# Patient Record
Sex: Female | Born: 1960 | Race: White | Hispanic: No | Smoking: Never smoker
Health system: Southern US, Community
[De-identification: ages and names within clinical notes are randomized; demographics above are authoritative.]

## PROBLEM LIST (undated history)

## (undated) DIAGNOSIS — T7840XA Allergy, unspecified, initial encounter: Secondary | ICD-10-CM

## (undated) DIAGNOSIS — K589 Irritable bowel syndrome without diarrhea: Secondary | ICD-10-CM

## (undated) DIAGNOSIS — G8929 Other chronic pain: Secondary | ICD-10-CM

## (undated) DIAGNOSIS — K635 Polyp of colon: Secondary | ICD-10-CM

## (undated) DIAGNOSIS — R51 Headache: Secondary | ICD-10-CM

## (undated) DIAGNOSIS — F329 Major depressive disorder, single episode, unspecified: Secondary | ICD-10-CM

## (undated) DIAGNOSIS — R112 Nausea with vomiting, unspecified: Secondary | ICD-10-CM

## (undated) DIAGNOSIS — Z8701 Personal history of pneumonia (recurrent): Secondary | ICD-10-CM

## (undated) DIAGNOSIS — G9050 Complex regional pain syndrome I, unspecified: Secondary | ICD-10-CM

## (undated) DIAGNOSIS — C539 Malignant neoplasm of cervix uteri, unspecified: Secondary | ICD-10-CM

## (undated) DIAGNOSIS — IMO0002 Reserved for concepts with insufficient information to code with codable children: Secondary | ICD-10-CM

## (undated) DIAGNOSIS — E669 Obesity, unspecified: Secondary | ICD-10-CM

## (undated) DIAGNOSIS — Z8719 Personal history of other diseases of the digestive system: Secondary | ICD-10-CM

## (undated) DIAGNOSIS — E039 Hypothyroidism, unspecified: Secondary | ICD-10-CM

## (undated) DIAGNOSIS — K219 Gastro-esophageal reflux disease without esophagitis: Secondary | ICD-10-CM

## (undated) DIAGNOSIS — A048 Other specified bacterial intestinal infections: Secondary | ICD-10-CM

## (undated) DIAGNOSIS — Z9889 Other specified postprocedural states: Secondary | ICD-10-CM

## (undated) DIAGNOSIS — Z8659 Personal history of other mental and behavioral disorders: Secondary | ICD-10-CM

## (undated) DIAGNOSIS — D131 Benign neoplasm of stomach: Secondary | ICD-10-CM

## (undated) DIAGNOSIS — M1990 Unspecified osteoarthritis, unspecified site: Secondary | ICD-10-CM

## (undated) DIAGNOSIS — J449 Chronic obstructive pulmonary disease, unspecified: Secondary | ICD-10-CM

## (undated) DIAGNOSIS — K2970 Gastritis, unspecified, without bleeding: Secondary | ICD-10-CM

## (undated) DIAGNOSIS — J45909 Unspecified asthma, uncomplicated: Secondary | ICD-10-CM

## (undated) DIAGNOSIS — F32A Depression, unspecified: Secondary | ICD-10-CM

## (undated) DIAGNOSIS — D649 Anemia, unspecified: Secondary | ICD-10-CM

## (undated) DIAGNOSIS — M0520 Rheumatoid vasculitis with rheumatoid arthritis of unspecified site: Secondary | ICD-10-CM

## (undated) DIAGNOSIS — F419 Anxiety disorder, unspecified: Secondary | ICD-10-CM

## (undated) DIAGNOSIS — R519 Headache, unspecified: Secondary | ICD-10-CM

## (undated) DIAGNOSIS — N809 Endometriosis, unspecified: Secondary | ICD-10-CM

## (undated) DIAGNOSIS — G4730 Sleep apnea, unspecified: Secondary | ICD-10-CM

## (undated) DIAGNOSIS — M797 Fibromyalgia: Secondary | ICD-10-CM

## (undated) DIAGNOSIS — I499 Cardiac arrhythmia, unspecified: Secondary | ICD-10-CM

## (undated) HISTORY — DX: Endometriosis, unspecified: N80.9

## (undated) HISTORY — DX: Major depressive disorder, single episode, unspecified: F32.9

## (undated) HISTORY — DX: Anemia, unspecified: D64.9

## (undated) HISTORY — DX: Other chronic pain: G89.29

## (undated) HISTORY — DX: Other specified bacterial intestinal infections: A04.8

## (undated) HISTORY — DX: Depression, unspecified: F32.A

## (undated) HISTORY — DX: Anxiety disorder, unspecified: F41.9

## (undated) HISTORY — DX: Sleep apnea, unspecified: G47.30

## (undated) HISTORY — DX: Unspecified asthma, uncomplicated: J45.909

## (undated) HISTORY — PX: NASAL SINUS SURGERY: SHX719

## (undated) HISTORY — PX: COLONOSCOPY: SHX174

## (undated) HISTORY — DX: Gastro-esophageal reflux disease without esophagitis: K21.9

## (undated) HISTORY — DX: Benign neoplasm of stomach: D13.1

## (undated) HISTORY — PX: CHOLECYSTECTOMY: SHX55

## (undated) HISTORY — DX: Irritable bowel syndrome, unspecified: K58.9

## (undated) HISTORY — DX: Obesity, unspecified: E66.9

## (undated) HISTORY — DX: Polyp of colon: K63.5

## (undated) HISTORY — DX: Headache, unspecified: R51.9

## (undated) HISTORY — DX: Personal history of other diseases of the digestive system: Z87.19

## (undated) HISTORY — DX: Rheumatoid vasculitis with rheumatoid arthritis of unspecified site: M05.20

## (undated) HISTORY — DX: Fibromyalgia: M79.7

## (undated) HISTORY — DX: Complex regional pain syndrome I, unspecified: G90.50

## (undated) HISTORY — DX: Chronic obstructive pulmonary disease, unspecified: J44.9

## (undated) HISTORY — DX: Allergy, unspecified, initial encounter: T78.40XA

## (undated) HISTORY — DX: Headache: R51

## (undated) HISTORY — PX: OTHER SURGICAL HISTORY: SHX169

## (undated) HISTORY — DX: Gastritis, unspecified, without bleeding: K29.70

## (undated) HISTORY — DX: Reserved for concepts with insufficient information to code with codable children: IMO0002

## (undated) HISTORY — DX: Malignant neoplasm of cervix uteri, unspecified: C53.9

## (undated) HISTORY — DX: Personal history of pneumonia (recurrent): Z87.01

## (undated) HISTORY — DX: Hypothyroidism, unspecified: E03.9

## (undated) HISTORY — DX: Personal history of other mental and behavioral disorders: Z86.59

## (undated) HISTORY — PX: TONSILLECTOMY: SUR1361

## (undated) HISTORY — DX: Unspecified osteoarthritis, unspecified site: M19.90

## (undated) HISTORY — PX: CERVIX SURGERY: SHX593

## (undated) HISTORY — PX: THORACOSCOPY W/ THORACIC SYMPATHECTOMY: SUR1348

## (undated) HISTORY — PX: UPPER GASTROINTESTINAL ENDOSCOPY: SHX188

---

## 1983-09-22 DIAGNOSIS — Z8711 Personal history of peptic ulcer disease: Secondary | ICD-10-CM

## 1983-09-22 HISTORY — DX: Personal history of peptic ulcer disease: Z87.11

## 1988-09-21 HISTORY — PX: CERVICAL SPINE SURGERY: SHX589

## 1998-04-17 ENCOUNTER — Ambulatory Visit (HOSPITAL_COMMUNITY): Admission: RE | Admit: 1998-04-17 | Discharge: 1998-04-17 | Payer: Self-pay | Admitting: Gastroenterology

## 1998-06-24 ENCOUNTER — Encounter: Admission: RE | Admit: 1998-06-24 | Discharge: 1998-09-22 | Payer: Self-pay | Admitting: Gastroenterology

## 1998-07-01 ENCOUNTER — Encounter: Admission: RE | Admit: 1998-07-01 | Discharge: 1998-07-01 | Payer: Self-pay | Admitting: Family Medicine

## 1998-09-10 ENCOUNTER — Inpatient Hospital Stay (HOSPITAL_COMMUNITY): Admission: AD | Admit: 1998-09-10 | Discharge: 1998-09-12 | Payer: Self-pay | Admitting: Psychiatry

## 1998-09-16 ENCOUNTER — Encounter (HOSPITAL_COMMUNITY): Admission: RE | Admit: 1998-09-16 | Discharge: 1998-12-15 | Payer: Self-pay | Admitting: Psychiatry

## 1998-10-10 ENCOUNTER — Encounter: Admission: RE | Admit: 1998-10-10 | Discharge: 1999-01-08 | Payer: Self-pay | Admitting: Gastroenterology

## 1999-01-17 ENCOUNTER — Encounter: Admission: RE | Admit: 1999-01-17 | Discharge: 1999-04-17 | Payer: Self-pay | Admitting: Gastroenterology

## 1999-04-02 ENCOUNTER — Encounter: Admission: RE | Admit: 1999-04-02 | Discharge: 1999-04-02 | Payer: Self-pay | Admitting: Family Medicine

## 1999-04-28 ENCOUNTER — Encounter: Admission: RE | Admit: 1999-04-28 | Discharge: 1999-04-28 | Payer: Self-pay | Admitting: Family Medicine

## 1999-05-15 ENCOUNTER — Encounter: Admission: RE | Admit: 1999-05-15 | Discharge: 1999-05-15 | Payer: Self-pay | Admitting: Family Medicine

## 1999-05-29 ENCOUNTER — Encounter: Admission: RE | Admit: 1999-05-29 | Discharge: 1999-05-29 | Payer: Self-pay | Admitting: Family Medicine

## 1999-06-11 ENCOUNTER — Other Ambulatory Visit: Admission: RE | Admit: 1999-06-11 | Discharge: 1999-06-11 | Payer: Self-pay | Admitting: Obstetrics and Gynecology

## 1999-06-12 ENCOUNTER — Encounter: Admission: RE | Admit: 1999-06-12 | Discharge: 1999-06-12 | Payer: Self-pay | Admitting: Family Medicine

## 1999-07-01 ENCOUNTER — Encounter: Admission: RE | Admit: 1999-07-01 | Discharge: 1999-07-01 | Payer: Self-pay | Admitting: Sports Medicine

## 1999-07-14 ENCOUNTER — Encounter: Admission: RE | Admit: 1999-07-14 | Discharge: 1999-07-14 | Payer: Self-pay | Admitting: Family Medicine

## 1999-07-30 ENCOUNTER — Encounter: Admission: RE | Admit: 1999-07-30 | Discharge: 1999-07-30 | Payer: Self-pay | Admitting: Family Medicine

## 1999-08-13 ENCOUNTER — Encounter: Admission: RE | Admit: 1999-08-13 | Discharge: 1999-08-13 | Payer: Self-pay | Admitting: Family Medicine

## 1999-09-03 ENCOUNTER — Encounter: Admission: RE | Admit: 1999-09-03 | Discharge: 1999-09-03 | Payer: Self-pay | Admitting: Family Medicine

## 1999-10-02 ENCOUNTER — Encounter: Admission: RE | Admit: 1999-10-02 | Discharge: 1999-10-02 | Payer: Self-pay | Admitting: Family Medicine

## 1999-10-15 ENCOUNTER — Encounter: Admission: RE | Admit: 1999-10-15 | Discharge: 1999-10-15 | Payer: Self-pay | Admitting: Family Medicine

## 1999-12-22 ENCOUNTER — Encounter: Admission: RE | Admit: 1999-12-22 | Discharge: 1999-12-22 | Payer: Self-pay | Admitting: Family Medicine

## 2000-01-13 ENCOUNTER — Encounter: Admission: RE | Admit: 2000-01-13 | Discharge: 2000-01-13 | Payer: Self-pay | Admitting: Family Medicine

## 2000-01-29 ENCOUNTER — Encounter: Admission: RE | Admit: 2000-01-29 | Discharge: 2000-01-29 | Payer: Self-pay | Admitting: Family Medicine

## 2000-03-03 ENCOUNTER — Encounter: Admission: RE | Admit: 2000-03-03 | Discharge: 2000-03-03 | Payer: Self-pay | Admitting: Family Medicine

## 2000-03-25 ENCOUNTER — Encounter: Admission: RE | Admit: 2000-03-25 | Discharge: 2000-03-25 | Payer: Self-pay | Admitting: Family Medicine

## 2000-06-03 ENCOUNTER — Encounter: Admission: RE | Admit: 2000-06-03 | Discharge: 2000-06-03 | Payer: Self-pay | Admitting: Family Medicine

## 2000-06-23 ENCOUNTER — Other Ambulatory Visit: Admission: RE | Admit: 2000-06-23 | Discharge: 2000-06-23 | Payer: Self-pay | Admitting: Obstetrics and Gynecology

## 2001-06-23 ENCOUNTER — Other Ambulatory Visit: Admission: RE | Admit: 2001-06-23 | Discharge: 2001-06-23 | Payer: Self-pay | Admitting: Obstetrics and Gynecology

## 2002-05-31 ENCOUNTER — Encounter: Admission: RE | Admit: 2002-05-31 | Discharge: 2002-05-31 | Payer: Self-pay | Admitting: Internal Medicine

## 2002-05-31 ENCOUNTER — Encounter: Payer: Self-pay | Admitting: Internal Medicine

## 2002-12-08 ENCOUNTER — Ambulatory Visit (HOSPITAL_COMMUNITY): Admission: RE | Admit: 2002-12-08 | Discharge: 2002-12-08 | Payer: Self-pay | Admitting: Internal Medicine

## 2002-12-08 ENCOUNTER — Encounter: Payer: Self-pay | Admitting: Internal Medicine

## 2003-04-11 ENCOUNTER — Other Ambulatory Visit: Admission: RE | Admit: 2003-04-11 | Discharge: 2003-04-11 | Payer: Self-pay | Admitting: Obstetrics and Gynecology

## 2003-09-22 ENCOUNTER — Emergency Department (HOSPITAL_COMMUNITY): Admission: EM | Admit: 2003-09-22 | Discharge: 2003-09-22 | Payer: Self-pay | Admitting: Emergency Medicine

## 2006-04-20 ENCOUNTER — Other Ambulatory Visit: Admission: RE | Admit: 2006-04-20 | Discharge: 2006-04-20 | Payer: Self-pay | Admitting: Obstetrics and Gynecology

## 2006-04-20 ENCOUNTER — Ambulatory Visit (HOSPITAL_COMMUNITY): Admission: RE | Admit: 2006-04-20 | Discharge: 2006-04-20 | Payer: Self-pay | Admitting: Obstetrics and Gynecology

## 2006-09-30 ENCOUNTER — Ambulatory Visit: Payer: Self-pay | Admitting: Gastroenterology

## 2006-10-05 ENCOUNTER — Ambulatory Visit (HOSPITAL_COMMUNITY): Admission: RE | Admit: 2006-10-05 | Discharge: 2006-10-05 | Payer: Self-pay | Admitting: Gastroenterology

## 2007-02-21 ENCOUNTER — Ambulatory Visit: Payer: Self-pay | Admitting: Gastroenterology

## 2007-02-21 LAB — CONVERTED CEMR LAB
Eosinophils Relative: 1.9 % (ref 0.0–5.0)
Ferritin: 15 ng/mL (ref 10.0–291.0)
HCT: 37.3 % (ref 36.0–46.0)
Iron: 80 ug/dL (ref 42–145)
Neutrophils Relative %: 59.4 % (ref 43.0–77.0)
RBC: 4.63 M/uL (ref 3.87–5.11)
RDW: 18 % — ABNORMAL HIGH (ref 11.5–14.6)
Transferrin: 301.1 mg/dL (ref >=212.0)
WBC: 7.8 10*3/uL (ref 4.5–10.5)

## 2007-03-09 ENCOUNTER — Ambulatory Visit: Payer: Self-pay | Admitting: Internal Medicine

## 2007-03-10 ENCOUNTER — Ambulatory Visit (HOSPITAL_COMMUNITY): Admission: RE | Admit: 2007-03-10 | Discharge: 2007-03-10 | Payer: Self-pay | Admitting: Internal Medicine

## 2007-03-24 ENCOUNTER — Ambulatory Visit: Payer: Self-pay | Admitting: Internal Medicine

## 2007-04-01 ENCOUNTER — Encounter: Payer: Self-pay | Admitting: Internal Medicine

## 2007-04-01 ENCOUNTER — Ambulatory Visit (HOSPITAL_COMMUNITY): Admission: RE | Admit: 2007-04-01 | Discharge: 2007-04-01 | Payer: Self-pay | Admitting: Internal Medicine

## 2007-04-01 ENCOUNTER — Encounter (INDEPENDENT_AMBULATORY_CARE_PROVIDER_SITE_OTHER): Payer: Self-pay | Admitting: Gastroenterology

## 2007-04-04 ENCOUNTER — Ambulatory Visit: Payer: Self-pay | Admitting: Gastroenterology

## 2007-04-04 LAB — CONVERTED CEMR LAB
OCCULT 1: POSITIVE — AB
OCCULT 5: NEGATIVE

## 2007-04-15 ENCOUNTER — Ambulatory Visit: Payer: Self-pay | Admitting: Internal Medicine

## 2007-05-03 ENCOUNTER — Ambulatory Visit (HOSPITAL_COMMUNITY): Admission: RE | Admit: 2007-05-03 | Discharge: 2007-05-03 | Payer: Self-pay | Admitting: Internal Medicine

## 2007-05-06 ENCOUNTER — Encounter: Admission: RE | Admit: 2007-05-06 | Discharge: 2007-05-06 | Payer: Self-pay | Admitting: Internal Medicine

## 2007-09-05 HISTORY — PX: LAPAROSCOPIC NISSEN FUNDOPLICATION: SHX1932

## 2007-09-06 ENCOUNTER — Inpatient Hospital Stay (HOSPITAL_COMMUNITY): Admission: RE | Admit: 2007-09-06 | Discharge: 2007-09-07 | Payer: Self-pay | Admitting: Surgery

## 2008-01-14 DIAGNOSIS — Z8659 Personal history of other mental and behavioral disorders: Secondary | ICD-10-CM | POA: Insufficient documentation

## 2008-01-14 DIAGNOSIS — K219 Gastro-esophageal reflux disease without esophagitis: Secondary | ICD-10-CM | POA: Insufficient documentation

## 2008-01-14 DIAGNOSIS — J45991 Cough variant asthma: Secondary | ICD-10-CM | POA: Insufficient documentation

## 2008-01-14 DIAGNOSIS — E039 Hypothyroidism, unspecified: Secondary | ICD-10-CM

## 2008-01-14 DIAGNOSIS — K589 Irritable bowel syndrome without diarrhea: Secondary | ICD-10-CM

## 2008-01-14 DIAGNOSIS — J45909 Unspecified asthma, uncomplicated: Secondary | ICD-10-CM

## 2008-01-14 DIAGNOSIS — F419 Anxiety disorder, unspecified: Secondary | ICD-10-CM | POA: Insufficient documentation

## 2008-01-14 DIAGNOSIS — E669 Obesity, unspecified: Secondary | ICD-10-CM | POA: Insufficient documentation

## 2008-01-14 DIAGNOSIS — F341 Dysthymic disorder: Secondary | ICD-10-CM

## 2008-01-14 HISTORY — DX: Personal history of other mental and behavioral disorders: Z86.59

## 2008-03-20 ENCOUNTER — Telehealth: Payer: Self-pay | Admitting: Internal Medicine

## 2008-08-13 ENCOUNTER — Encounter: Admission: RE | Admit: 2008-08-13 | Discharge: 2008-08-13 | Payer: Self-pay | Admitting: Obstetrics and Gynecology

## 2008-10-10 ENCOUNTER — Encounter: Payer: Self-pay | Admitting: Internal Medicine

## 2008-12-17 ENCOUNTER — Telehealth: Payer: Self-pay | Admitting: Internal Medicine

## 2008-12-19 ENCOUNTER — Encounter: Payer: Self-pay | Admitting: Internal Medicine

## 2009-02-25 ENCOUNTER — Ambulatory Visit: Payer: Self-pay | Admitting: Internal Medicine

## 2009-02-25 DIAGNOSIS — Z9283 Personal history of failed moderate sedation: Secondary | ICD-10-CM

## 2009-02-25 DIAGNOSIS — Z860100 Personal history of colon polyps, unspecified: Secondary | ICD-10-CM | POA: Insufficient documentation

## 2009-02-26 DIAGNOSIS — G9050 Complex regional pain syndrome I, unspecified: Secondary | ICD-10-CM | POA: Insufficient documentation

## 2009-02-26 HISTORY — DX: Complex regional pain syndrome I, unspecified: G90.50

## 2009-02-27 ENCOUNTER — Telehealth: Payer: Self-pay | Admitting: Internal Medicine

## 2009-03-19 ENCOUNTER — Telehealth: Payer: Self-pay | Admitting: Internal Medicine

## 2009-03-22 ENCOUNTER — Telehealth: Payer: Self-pay | Admitting: Internal Medicine

## 2009-03-26 ENCOUNTER — Telehealth: Payer: Self-pay | Admitting: Internal Medicine

## 2009-03-28 ENCOUNTER — Ambulatory Visit (HOSPITAL_COMMUNITY): Admission: RE | Admit: 2009-03-28 | Discharge: 2009-03-28 | Payer: Self-pay | Admitting: Internal Medicine

## 2009-03-28 ENCOUNTER — Ambulatory Visit: Payer: Self-pay | Admitting: Internal Medicine

## 2009-03-29 ENCOUNTER — Telehealth: Payer: Self-pay | Admitting: Internal Medicine

## 2009-04-09 ENCOUNTER — Telehealth: Payer: Self-pay | Admitting: Internal Medicine

## 2009-04-23 ENCOUNTER — Encounter: Payer: Self-pay | Admitting: Internal Medicine

## 2009-04-24 ENCOUNTER — Encounter: Payer: Self-pay | Admitting: Internal Medicine

## 2009-07-12 ENCOUNTER — Telehealth: Payer: Self-pay | Admitting: Internal Medicine

## 2009-08-21 ENCOUNTER — Encounter: Admission: RE | Admit: 2009-08-21 | Discharge: 2009-08-21 | Payer: Self-pay | Admitting: Obstetrics and Gynecology

## 2010-04-22 ENCOUNTER — Encounter: Admission: RE | Admit: 2010-04-22 | Discharge: 2010-04-22 | Payer: Self-pay | Admitting: Internal Medicine

## 2010-09-02 ENCOUNTER — Encounter
Admission: RE | Admit: 2010-09-02 | Discharge: 2010-09-02 | Payer: Self-pay | Source: Home / Self Care | Attending: Internal Medicine | Admitting: Internal Medicine

## 2010-10-12 ENCOUNTER — Encounter: Payer: Self-pay | Admitting: Internal Medicine

## 2010-12-16 ENCOUNTER — Emergency Department (HOSPITAL_COMMUNITY)
Admission: EM | Admit: 2010-12-16 | Discharge: 2010-12-17 | Disposition: A | Discharge status: Other Acute Inpatient Hospital | Payer: Medicare Other | Attending: Emergency Medicine | Admitting: Emergency Medicine

## 2010-12-16 DIAGNOSIS — F3289 Other specified depressive episodes: Secondary | ICD-10-CM | POA: Insufficient documentation

## 2010-12-16 DIAGNOSIS — F329 Major depressive disorder, single episode, unspecified: Secondary | ICD-10-CM | POA: Insufficient documentation

## 2010-12-16 DIAGNOSIS — E039 Hypothyroidism, unspecified: Secondary | ICD-10-CM | POA: Insufficient documentation

## 2010-12-16 DIAGNOSIS — R110 Nausea: Secondary | ICD-10-CM | POA: Insufficient documentation

## 2010-12-16 DIAGNOSIS — R51 Headache: Secondary | ICD-10-CM | POA: Insufficient documentation

## 2010-12-16 DIAGNOSIS — R45851 Suicidal ideations: Secondary | ICD-10-CM | POA: Insufficient documentation

## 2010-12-16 LAB — RAPID URINE DRUG SCREEN, HOSP PERFORMED
Amphetamines: NOT DETECTED
Barbiturates: NOT DETECTED
Benzodiazepines: POSITIVE — AB
Cocaine: NOT DETECTED

## 2010-12-16 LAB — URINALYSIS, ROUTINE W REFLEX MICROSCOPIC
Bilirubin Urine: NEGATIVE
Ketones, ur: NEGATIVE mg/dL
Protein, ur: NEGATIVE mg/dL
Urobilinogen, UA: 0.2 mg/dL (ref 0.0–1.0)

## 2010-12-16 LAB — CBC
Hemoglobin: 12.2 g/dL (ref 12.0–15.0)
MCH: 26.3 pg (ref 26.0–34.0)
RBC: 4.63 MIL/uL (ref 3.87–5.11)
WBC: 7.9 10*3/uL (ref 4.0–10.5)

## 2010-12-16 LAB — COMPREHENSIVE METABOLIC PANEL
ALT: 10 U/L (ref 0–35)
AST: 24 U/L (ref 0–37)
Albumin: 4.1 g/dL (ref 3.5–5.2)
CO2: 27 mEq/L (ref 19–32)
Chloride: 103 mEq/L (ref 96–112)
GFR calc Af Amer: >60 mL/min (ref >60)
GFR calc non Af Amer: >60 mL/min (ref >60)
Potassium: 4.3 mEq/L (ref 3.5–5.1)
Sodium: 138 mEq/L (ref 135–145)
Total Bilirubin: 0.4 mg/dL (ref 0.3–1.2)

## 2010-12-16 LAB — URINE MICROSCOPIC-ADD ON

## 2010-12-16 LAB — DIFFERENTIAL
Basophils Relative: 0 % (ref 0–1)
Lymphs Abs: 2 10*3/uL (ref 0.7–4.0)
Monocytes Relative: 6 % (ref 3–12)
Neutro Abs: 5.3 10*3/uL (ref 1.7–7.7)
Neutrophils Relative %: 67 % (ref 43–77)

## 2010-12-17 ENCOUNTER — Inpatient Hospital Stay (HOSPITAL_COMMUNITY)
Admission: AD | Admit: 2010-12-17 | Discharge: 2010-12-19 | DRG: 885 | Disposition: A | Discharge status: Home / Self Care | Payer: 59 | Source: Ambulatory Visit | Attending: Psychiatry | Admitting: Psychiatry

## 2010-12-17 DIAGNOSIS — R45851 Suicidal ideations: Secondary | ICD-10-CM

## 2010-12-17 DIAGNOSIS — E282 Polycystic ovarian syndrome: Secondary | ICD-10-CM

## 2010-12-17 DIAGNOSIS — K219 Gastro-esophageal reflux disease without esophagitis: Secondary | ICD-10-CM

## 2010-12-17 DIAGNOSIS — G9050 Complex regional pain syndrome I, unspecified: Secondary | ICD-10-CM

## 2010-12-17 DIAGNOSIS — E039 Hypothyroidism, unspecified: Secondary | ICD-10-CM

## 2010-12-17 DIAGNOSIS — Z818 Family history of other mental and behavioral disorders: Secondary | ICD-10-CM

## 2010-12-17 DIAGNOSIS — F339 Major depressive disorder, recurrent, unspecified: Principal | ICD-10-CM

## 2010-12-17 DIAGNOSIS — G8929 Other chronic pain: Secondary | ICD-10-CM

## 2010-12-18 DIAGNOSIS — F329 Major depressive disorder, single episode, unspecified: Secondary | ICD-10-CM

## 2010-12-18 DIAGNOSIS — F3289 Other specified depressive episodes: Secondary | ICD-10-CM

## 2010-12-23 NOTE — Discharge Summary (Signed)
  NAME:  Jo [name], Jo [name]                ACCOUNT NO.:  [id number]  MEDICAL RECORD NO.:  [id number]           PATIENT TYPE:  I  LOCATION:  0501                          FACILITY:  [facility]  PHYSICIAN:  Marlis Edelson, DO        DATE OF BIRTH:  1961/04/17  DATE OF ADMISSION:  12/17/2010 DATE OF DISCHARGE:  12/19/2010                              DISCHARGE SUMMARY   REASON FOR ADMISSION:  This is a 50 year old female due to depression and suicidal thoughts after the death of her mother.  She was feeling very sad.  She had no thoughts of self-harm.  The patient just wanted to get herself feeling better.  SIGNIFICANT LABS:  Urine drug screen positive for benzodiazepines and opiates.  Her CMP was within normal limits.  CBC within normal limits. Urine pregnancy test is negative.  SIGNIFICANT FINDINGS:  The patient was admitted to the adult milieu and involved in mood disorder group.  We reviewed her medications.  We contacted her sister to assess safety concerns, gather collateral information and provide information.  The patient was fully alert and denied any suicidal or homicidal thoughts or psychotic symptoms.  Her speech was clear and abrupt and felt that she was better than the past days.  On day of discharge the patient was feeling much better.  Her sister had visited.  She realized she had strong family support.  They had no safety concerns.  The patient denied any suicidal or homicidal thoughts or psychotic symptoms.  She showed no signs of mania, hypomania and will be with her family after discharge.  Her affect is in full range.  She was comfortable.  She showed no signs of depressive symptoms or anxiety.  She was conversive and thought processes were linear.  DISCHARGE MEDICATIONS: 1. Xanax 0.5 mg q.h.s. 2. Gabapentin 300 mg one t.i.d. 3. Methadone 10 mg q.i.d. 4. Zoloft 100 mg 1-1/2 tablets daily. 5. Allegra as needed. 6. Hydromorphone 4 mg q.4 hours p.r.n. pain. 7. Hydroxyzine 50  mg 1 t.i.d. p.r.n. anxiety. 8. Levothyroxine 100 mcg daily. 9. Maxair 1-2 puffs q.6 hours p.r.n. 10.Metformin 1000 mg b.i.d. 11.Singulair 10 mg 1 daily.  FOLLOWUP:  [facility] on April 2nd at 1:45.  Phone number [phone].     Landry Corporal, N.P.   ______________________________ Marlis Edelson, DO    JO/MEDQ  D:  12/22/2010  T:  12/22/2010  Job:  [id number]  Electronically Signed by Limmie PatriciaP. on 12/23/2010 02:13:30 PM Electronically Signed by Marlis Edelson MD on 12/23/2010 08:43:06 PM

## 2010-12-28 LAB — BASIC METABOLIC PANEL
BUN: 7 mg/dL (ref 6–23)
Chloride: 108 mEq/L (ref 96–112)
GFR calc non Af Amer: >60 mL/min (ref >60)
Glucose, Bld: 77 mg/dL (ref 70–99)
Potassium: 3.7 mEq/L (ref 3.5–5.1)
Sodium: 139 mEq/L (ref 135–145)

## 2010-12-28 LAB — CBC
HCT: 38.6 % (ref 36.0–46.0)
Hemoglobin: 12.6 g/dL (ref 12.0–15.0)
MCV: 79.8 fL (ref 78.0–100.0)
Platelets: 282 10*3/uL (ref 150–400)
RDW: 17.6 % — ABNORMAL HIGH (ref 11.5–15.5)
WBC: 7.4 10*3/uL (ref 4.0–10.5)

## 2010-12-28 LAB — DIFFERENTIAL
Eosinophils Absolute: 0.2 10*3/uL (ref 0.0–0.7)
Lymphocytes Relative: 24 % (ref 12–46)
Lymphs Abs: 1.8 10*3/uL (ref 0.7–4.0)
Monocytes Relative: 7 % (ref 3–12)
Neutro Abs: 4.9 10*3/uL (ref 1.7–7.7)
Neutrophils Relative %: 67 % (ref 43–77)

## 2010-12-28 LAB — GLUCOSE, CAPILLARY: Glucose-Capillary: 71 mg/dL (ref 70–99)

## 2011-02-03 NOTE — Assessment & Plan Note (Signed)
[facility]                         GASTROENTEROLOGY OFFICE NOTE   NAME:[name] [name], [name] [name]                       MRN:          [id number]  DATE:02/21/2007                            DOB:          18-Oct-1960    [name] [name] comes in, is consulted by Dr. [name] at [facility].  She was referred because of her anemia which revealed a  hemoglobin 11.4 and MCV of 76.2, all else was essentially normal as far  as her blood studies were concerned.  She says she is still having a  great deal of reflux symptoms.  She is watching what she eats, is  following the lifestyle changes as much as possible except she continues  to be overweight.  She is taking her Neurontin, Dilaudid, Prilosec,  Aleve,  Synthroid, Repliva, methadone, Reglan 10 mg p.o., and Maxair,  hydrocodone, and doxycycline.  She has marked gastroesophageal reflux  problems with associated pulmonary pathology which is probably related  to her asthma and COPD.  She has a history of irritable bowel,  anxiety/depression with depression reflex sympathetic dystrophy, history  of gastric ulcer disease by a number of years previous, and she has  moderate obesity and anxiety/depression.  She had tried to get a barium  swallow but was unable to swallow the barium and kept regurgitating, so  this study was not very satisfactory.  She has a significantly abnormal  pH study and I just suggested to her that we refer her to Dr. Wenda Low to determine whether she could undergo a hernia repair which I  think would certainly be in her best interest.  In the meantime I wanted  to get some routine labs on her, especially for her anemia, give her  some Carafate slurry or suspension, Nexium b.i.d., get Hemoccults on  her, get anemia profile with serum iron, iron binding, B-12 folate just  to make sure what we are dealing with here.  Her Hemoccults were  negative from her primary care doctor's office.  I also  told her, as I  have in the past, that she needs a colonoscopic examination because of  her mother's history of a large polyp in the cecum which was removed  with a cecotomy that had high grade dysplasia.  She was advised by Dr.  Sabino Gasser to have this done.  I am worried because of her pulmonary  symptoms and the terrible reflux that she has that she is going to get  into other complications if something more defense is not done.  I at  one time thought it would be nice to be able to wait until an intra-  endoscopic fundoplication could be done, unfortunately this is not  available as yet that I am aware of and therefore feel that she should  be at this time referred for consultation to Dr. Wenda Low.     Ulyess Mort, MD  Electronically Signed    SML/MedQ  DD: 02/21/2007  DT: 02/22/2007  Job #: [id number]
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
## 2011-02-03 NOTE — Assessment & Plan Note (Signed)
[facility]                         GASTROENTEROLOGY OFFICE NOTE   NAME:WHICKER, PAILYN BELLEVUE                       MRN:          [id number]  DATE:03/09/2007                            DOB:          18-Apr-1961    ADDENDUM:  [name] was given samples of Zegerid to see if that helped her  reflux any better than the Nexium.     Iva Boop, MD,FACG  Electronically Signed    CEG/MedQ  DD: 03/10/2007  DT: 03/11/2007  Job #: [id number]   cc:   Georgianne Fick, M.D.

## 2011-02-03 NOTE — Op Note (Signed)
NAME:  [name] [name], [name] [name]                ACCOUNT NO.:  [id number]   MEDICAL RECORD NO.:  [id number]          PATIENT TYPE:  AMB   LOCATION:  ENDO                         FACILITY:  [facility]   PHYSICIAN:  Iva Boop, MD,FACGDATE OF BIRTH:  May 27, 1961   DATE OF PROCEDURE:  04/14/2007  DATE OF DISCHARGE:  04/01/2007                               OPERATIVE REPORT   PROCEDURE:  Bravo 48-hour pH monitoring of the esophagus.   INDICATIONS:  Chronic reflux symptoms not responsive to medical therapy.   The patient is on Zegerid 40 mg twice daily for the procedure.   FINDINGS:  1. Day one, she did have reflux, 41 refluxes total.  10.8% pH less      than 4, that was total time.  6.9% was upright, 17.4% supine, 28%      postprandial, 12.5% associated with heartburn.   Day two there were 22 refluxes.  The fraction of time pH less than 4 was  27% a total, 35% upright, 20% supine, 35% postprandial and 75% of  heartburn.  For total she had 63 refluxes, 18% of total time with  fraction of time pH less than 4, upright 17%, supine 19%, postprandial  32% heartburn 13%.   Demeester score for day two was 81.5.   Note the patient had a lot of heartburn during periods of pH less than  4.  On day one there was constant heartburn reported so there was not  really great correlation between heartburn and other symptoms and her  symptoms of reflux.  Notably on day two there was a long period of  acidity and then a rise in the pH and then a drop again and then a rise  again and the pH gap got up to 8 at times.  This makes me question as to  whether or not the probe fell off in day two.   ASSESSMENT:  There is evidence of acid reflux on the study despite  taking Zegerid 40 mg twice daily.  I question whether or not the probe  fell off at some point.  There is poor symptom correlation.  This  continues to raise concerns about the appropriateness for fundoplication  in this patient.   PLAN:  I have discussed  the results with the patient as well as Dr. Wenda Low.  We will hold off on any type of fundoplication procedure that  has been planned.  We will get evaluation with either Dr. Adriana Simas or Dr.  Jacqulyn Bath at [facility] for their opinions  about her reflux, her reflex sympathetic dystrophy, her vomiting and  appropriate therapy.      Iva Boop, MD,FACG  Electronically Signed     CEG/MEDQ  D:  04/14/2007  T:  04/15/2007  Job:  [id number]   cc:   Thornton Park Daphine Deutscher, MD  [address], [address]  [place]  [place] [address]   Georgianne Fick, M.D.  Fax: [phone]

## 2011-02-03 NOTE — Letter (Signed)
August 24, 2007     RE:  VERLEAN, ALLPORT  MRN:  [id number]  /  DOB:  1961-09-18   To Whom It May Concern:   Ms. [name] [name] is a patient of mine.  She has gastroesophageal reflux  disease and is using Zegerid twice daily for severe gastroesophageal  reflux disease.  She has been on all other proton pump inhibitors and  this is the only one that seems to help control her symptoms to a  reasonable degree.   It is my expert opinion as a board-certified gastroenterologist that  this patient needs Zegerid.  I would ask that you consider this and try  to help make this a cost-effective medication for her care.    Sincerely,      Iva Boop, MD,FACG  Electronically Signed    CEG/MedQ  DD: 08/24/2007  DT: 08/24/2007  Job #: [id number]   CC:    Ellery Plunk

## 2011-02-03 NOTE — Assessment & Plan Note (Signed)
[facility]                         GASTROENTEROLOGY OFFICE NOTE   NAME:[name] [name], [name] [name]                       MRN:          [id number]  DATE:04/14/2007                            DOB:          [birth year]    I called [name] and reviewed the results of her Bravo pH study. There is  acid reflux but there are incomplete associations with symptoms here, ie  poor symptom correlation. See that report for full details.   She had benign fundic gland polyps in the stomach. I told her we would  sort out a monitoring process for those, that those are likely related  to proton pump inhibitor use and are benign. She did have an adenomatous  colon polyp. She was difficult to sedate for her procedure and the colon  prep was fair at best. It was really poor overall. We plan to have a  colonoscopy within a year. We will do that under propofol and with a  different prep, probably MoviPrep, hopefully she will be able to hold  that down.     Iva Boop, MD,FACG  Electronically Signed    CEG/MedQ  DD: 04/14/2007  DT: 04/15/2007  Job #: [id number]

## 2011-02-03 NOTE — Assessment & Plan Note (Signed)
HEALTHCARE                         GASTROENTEROLOGY OFFICE NOTE   NAME:[name] [name] GENGLER                       MRN:          [id number]  DATE:03/09/2007                            DOB:          1960-10-29    PROBLEMS:  1. Reflex sympathetic dystrophy.  2. Gastroesophageal reflux disease, not responsive to medications.  3. Asthma.  4. Anxiety and depression.  5. Irritable bowel syndrome.  6. Obesity.  7. History of gastric ulcer.  8. Abnormal 24-hour pH probe, February 2002.  Note:  LES was not      manometrically confirmed.  9. Last upper endoscopy I see January 1999 with small hiatal hernia,      mild antritis of the stomach.  10.Did have gastric ulcer June 1985.  11.History of anorexia nervosa and bulimia, resolved.  12.Hypothyroidism.  13.Drug allergies to erythromycin, tetracycline, Darvocet and codeine.  14.She is sensitive to Reglan with side effects of jitteriness.   MEDICATIONS:  1. Neurontin 300 mg b.i.d.  2. Dilaudid 2 mg t.i.d. p.r.n.  3. Progesterone birth control and estrogen birth control.  4. Alprazolam 0.25 mg b.i.d. p.r.n.  5. Tizanidine hydrochlorothiazide 4 mg three in the morning, a half      pill in the evening.  6. Methadone 10 mg two tabs four times a day p.r.n.  7. Reglan 10 mg p.r.n., used rarely.  8. Synthroid 150 micrograms daily.  9. Repliva 21/7 iron one a day.  10.Nexium 40 mg b.i.d.  11.Omeprazole also taken 40 mg b.i.d.  12.Phenergan 25 mg p.r.n.  13.Hydroxyzine p.r.n.  14.Maxair p.r.n.   [name] [name] has been a patient of Dr. Corinda Gubler and recently saw him and, because  of some rectal bleeding and mother's history of large colon polyp and  high-grade dysplasia, is recommended to have a colonoscopy.  She has not  been able to set that up with Dr. Corinda Gubler, so I will take care of this  for her.  She has a lot of heartburn symptomatology and has a small  hiatal hernia and recently saw Dr. Wenda Low about a fundal  plication.  He is tentatively planning to do that in August.  She has not had a  manometry study that I am aware of.  She has had a lot of asthma,  related to her reflux.  However, she still has symptoms, despite taking  Nexium twice a day and omeprazole twice a day.  I am not sure of her  previous history of PPI therapy at this time.  She is not really  describing dysphagia, but she was unable to tolerate drinking barium  with a barium swallow with regurgitation and it sounds like she has a  lot of regurgitation of fluid, in addition to pyrosis type symptoms.  She also tells me that, when she defecates, she cannot complete a  movement and sometimes has to manually remove the stool.   Additional history is that she has had a thoracic sympathectomy for her  reflux sympathetic dystrophy.  Review of that report does show that the  examination was mildly limited because she regurgitated on a barium  swallow.  She could not complete repetitive swallow.  She had emesis and  regurgitation.  A 12 mm barium tablet passed without difficulty.  A  small hiatal hernia was seen.  It looks like she had a normal primary  peristaltic wave.   PHYSICAL EXAM:  Shows her to be in no acute distress.  Weight 180  pounds.  Pulse 64, blood pressure 106/78, height 5 feet 4 inches.   ASSESSMENT:  1. It certainly sounds like she has gastroesophageal reflux disease,      but I am concerned about the lack of response to medication.  She      is on both omeprazole and Nexium and that does not make any real      sense to me.  Those are similar medications and she is not getting      benefit from both, so she should take only one and I have given her      Nexium samples.  Perhaps she has more of a regurgitation problem      than acid causing problems, as she should be blocking most of her      acid.  The previous pH study is interesting, though at that time,      that study was not done with a manometric confirmation  of her lower      esophageal sphincter, which makes the result suspect, in my      opinion.  I have significant concerns at this time about a      fundoplication and how well she would do with that.  It may very      well be the right thing, but I think further evaluation is      necessary.  2. Family history of colon polyps, rectal bleeding.   PLAN:  1. Gastric emptying study is ordered.  At the time of dictation, this      has returned and the results are normal, fortunately.  She had      normal emptying with 32.4% retention at 60 minutes and 27%      retention at 120 minutes; normal is less than 30%. So, her emptying      was just close to abnormal at 2 hrs, possibly due to medications..  2. I think she should have an esophageal manometry before her fundal      plication and I suspect Dr. Daphine Deutscher will want that.  3. I would consider a pH probe in her, such as a BRAVO probe, on      medication, to see if we can clear up whether or not she is still      making acid on her medication.  4. I ordered a gastrin level on her, as Zollinger-Ellison syndrome      could be possible and then make her refractory to PPIs.  I am      always concerned about patients that are refractory to PPIs and how      it will do with a fundoplication, though sometimes we have to press      forward because of their recalcitrant symptoms, as long as they      understand the possibility of a lack of response.  I do think she      has a functional bowel disorder and that may be playing some role      and I do not know what role the reflex sympathetic dystrophy is      playing, nor the thoracic  sympathectomy, in all this.   I will discuss further with her when she has her upper endoscopy and  colonoscopy.  I think it would be very reasonable to place a BRAVO pH  probe in this lady, as well, and I will discuss  that with her.  I doubt she could handle a 24-hour pH probe again, though she did it in the past.  I  would like to do this on medication.     Iva Boop, MD,FACG  Electronically Signed    CEG/MedQ  DD: 03/10/2007  DT: 03/11/2007  Job #: [id number]   cc:   Thornton Park Daphine Deutscher, MD  Georgianne Fick, M.D.

## 2011-02-06 NOTE — Assessment & Plan Note (Signed)
[facility]                         GASTROENTEROLOGY OFFICE NOTE   NAME:Jo [name], Jo [name]                         MRN:          [id number]  DATE:08/09/2007                            DOB:          1961-04-15    I called [name].  She faxed me the esophageal pH and impedance study and  manometry that she had performed and interpreted by Dr. Jacqulyn Bath at [facility].  She has nonacid reflux.  Her symptom correlation was good per Dr. Edwin Dada  report.  She had a normal manometry of the esophagus.  It makes sense to  proceed with a fundoplication procedure to try to control her reflux.  She is not having significant acid production on her PPI.  I explained  to her that we could not guarantee this would fix all of her problems  with this, but it made medical sense to proceed.     Iva Boop, MD,FACG  Electronically Signed    CEG/MedQ  DD: 08/09/2007  DT: 08/10/2007  Job #: [id number]   cc:   Thornton Park Daphine Deutscher, MD

## 2011-02-06 NOTE — Assessment & Plan Note (Signed)
[facility]                         GASTROENTEROLOGY OFFICE NOTE   NAME:Jo [name], Jo [name]                       MRN:          [id number]  DATE:09/30/2006                            DOB:          [birth year]    She works for [employer].  This young lady says she is having marked  breakthrough in her reflux.  She does have notable COPD and asthma.  There has been no association between this and we have worked her up in  the past. She says when she lies down at night, she has fluid running up  in and out of her nose which is very concerning to her.  She has  elevated her bed to some degree but not greatly and obviously not  enough.  She has not lost enough weight and she is aware of this.  We  have done motility studies and pH studies on her before which reflected  a significant amount of reflux in the past.  In fact, she has never  considered undergoing a surgical procedure.  The last endoscopic  procedure was 1999 which revealed a 3 cm hiatal hernia and mild evidence  of some adjacent gastritis.  In 1980, when she was just a teenager, she  had an extremely large ulcer in her stomach which I thought might be  related to medication at the time.   MEDICATIONS:  Her medications presently include she has a reflex  sympathetic dystrophy on Aleve, Prilosec, Neurontin, methadone, Nexium,  and alprazolam, hydrocodone, Reglan 10 mg p.r.n., and Dilaudid.   PHYSICAL EXAMINATION:  VITAL SIGNS:  She weighs 181 pounds, blood  pressure 100/75, pulse 72 and regular.  The rest of the exam was  unremarkable.   IMPRESSION:  1. Marked gastroesophageal reflux disease symptoms and associated      pulmonary pathology such as chronic obstructive pulmonary disease      and asthma.  2. History of irritable bowel syndrome.  3. More moderate obesity than before.  4. Anxiety and depression.  5. Reflex sympathetic dystrophy.  6. History of gastric ulcer.   RECOMMENDATIONS:  My  recommendation is that we get a barium swallow on  her and encourage her to elevate the head of her bed six to eight  inches, use Nexium b.i.d.  She says she has a hard time getting  medication at this level. Start Reglan 10 mg a.c. and 20 mg at night.  Then consider  getting a fundoplication or consider a [facility] referral for one of the new procedures to correct this hernia  via the endoscope.  I am not sure which procedures they do at the  [facility], but I will check into this depending upon the  results of her further studies.  I certainly think that her symptoms  needs strong consideration.     Ulyess Mort, MD  Electronically Signed    SML/MedQ  DD: 09/30/2006  DT: 10/01/2006  Job #: [id number]

## 2011-06-26 LAB — CBC
HCT: 30.6 — ABNORMAL LOW
HCT: 33 — ABNORMAL LOW
Hemoglobin: 11.3 — ABNORMAL LOW
MCHC: 33.7
MCV: 85.8
Platelets: 261
Platelets: 268
RBC: 3.87
WBC: 11 — ABNORMAL HIGH
WBC: 8.5

## 2011-06-26 LAB — DIFFERENTIAL
Eosinophils Absolute: 0.1 — ABNORMAL LOW
Eosinophils Relative: 0
Eosinophils Relative: 1
Lymphocytes Relative: 8 — ABNORMAL LOW
Lymphs Abs: 0.9
Lymphs Abs: 2.9
Monocytes Relative: 3
Monocytes Relative: 5

## 2011-06-29 LAB — PREGNANCY, URINE: Preg Test, Ur: NEGATIVE

## 2011-08-05 ENCOUNTER — Telehealth: Payer: Self-pay | Admitting: Internal Medicine

## 2011-08-05 NOTE — Telephone Encounter (Signed)
Patient advised.

## 2011-08-05 NOTE — Telephone Encounter (Signed)
Patient reports a bad taste in her mouth after sleeping or lying down.  She does take omeprazole BID for reflux.  Her primary care MD has treated her with diflucan and she sees no improvement in her symptoms.  Dr Leone Payor do you have any suggestions? 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  Dr Leone Payor please advise

## 2011-08-05 NOTE — Telephone Encounter (Signed)
Doubt this is reflux but possible. Try Pepcid complete at bedtime and raise head of bed. If still a problem after 1 month let me know

## 2011-09-22 DIAGNOSIS — A048 Other specified bacterial intestinal infections: Secondary | ICD-10-CM

## 2011-09-22 HISTORY — PX: OTHER SURGICAL HISTORY: SHX169

## 2011-09-22 HISTORY — DX: Other specified bacterial intestinal infections: A04.8

## 2011-10-19 ENCOUNTER — Other Ambulatory Visit: Payer: Self-pay | Admitting: Obstetrics and Gynecology

## 2011-10-19 DIAGNOSIS — Z1231 Encounter for screening mammogram for malignant neoplasm of breast: Secondary | ICD-10-CM

## 2011-11-04 ENCOUNTER — Ambulatory Visit: Payer: Medicare Other

## 2011-12-04 ENCOUNTER — Ambulatory Visit
Admission: RE | Admit: 2011-12-04 | Discharge: 2011-12-04 | Disposition: A | Discharge status: Home / Self Care | Payer: Medicare Other | Source: Ambulatory Visit | Attending: Obstetrics and Gynecology | Admitting: Obstetrics and Gynecology

## 2011-12-04 ENCOUNTER — Ambulatory Visit: Payer: Medicare Other

## 2011-12-04 DIAGNOSIS — Z1231 Encounter for screening mammogram for malignant neoplasm of breast: Secondary | ICD-10-CM

## 2012-01-01 ENCOUNTER — Ambulatory Visit: Payer: 59 | Admitting: Internal Medicine

## 2012-04-21 HISTORY — PX: KNEE ARTHROSCOPY: SHX127

## 2012-07-01 ENCOUNTER — Telehealth: Payer: Self-pay | Admitting: Internal Medicine

## 2012-07-01 NOTE — Telephone Encounter (Signed)
Patient reports that her primary care Dr. Barrie Dunker has prescribed h pylori treatment.  Prev-pak.  She reports that she is having abdominal pain and mucous in her stool.  She reports that she has not started on the Prev-pak yet.  She is advised to take it as prescribed by her primary MD and I have given her an appt with Dr. Leone Payor for 07/25/12.  She will have Dr. Carlean Jews office send [place] the office notes and labs

## 2012-07-25 ENCOUNTER — Ambulatory Visit (INDEPENDENT_AMBULATORY_CARE_PROVIDER_SITE_OTHER): Payer: 59 | Admitting: Internal Medicine

## 2012-07-25 ENCOUNTER — Encounter: Payer: Self-pay | Admitting: Internal Medicine

## 2012-07-25 VITALS — BP 92/68 | HR 80 | Ht 63.0 in | Wt 181.5 lb

## 2012-07-25 DIAGNOSIS — R1310 Dysphagia, unspecified: Secondary | ICD-10-CM

## 2012-07-25 DIAGNOSIS — K625 Hemorrhage of anus and rectum: Secondary | ICD-10-CM

## 2012-07-25 DIAGNOSIS — R197 Diarrhea, unspecified: Secondary | ICD-10-CM
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
## 2012-07-25 NOTE — Patient Instructions (Addendum)
You have been scheduled for an endoscopy and colonoscopy with propofol. Please follow the written instructions given to you at your visit today. Please use the prepopik you have been given today. If you use inhalers (even only as needed) or a CPAP machine, please bring them with you on the day of your procedure.  Thank you for choosing me and [facility].  Iva Boop, M.D., [facility]

## 2012-07-31 ENCOUNTER — Telehealth: Payer: Self-pay | Admitting: Gastroenterology

## 2012-07-31 NOTE — Telephone Encounter (Signed)
On call note @ 1730. Pt for colonoscopy tomorrow and has not had much result from Prepopik. She has Ducolax tablets at home. Advised to push fluids. Take 2 Ducolax now and repeat in 3 hours. Complete 2nd Prepopik dose as planned.

## 2012-08-01 ENCOUNTER — Ambulatory Visit (AMBULATORY_SURGERY_CENTER): Payer: Medicare Other | Admitting: Internal Medicine

## 2012-08-01 ENCOUNTER — Telehealth: Payer: Self-pay | Admitting: Internal Medicine

## 2012-08-01 ENCOUNTER — Telehealth: Payer: Self-pay | Admitting: *Deleted

## 2012-08-01 ENCOUNTER — Other Ambulatory Visit (INDEPENDENT_AMBULATORY_CARE_PROVIDER_SITE_OTHER): Payer: 59

## 2012-08-01 ENCOUNTER — Encounter: Payer: Self-pay | Admitting: Internal Medicine

## 2012-08-01 VITALS — BP 132/76 | HR 71 | Temp 99.1°F | Resp 25 | Ht 63.0 in | Wt 181.0 lb

## 2012-08-01 DIAGNOSIS — K625 Hemorrhage of anus and rectum: Secondary | ICD-10-CM

## 2012-08-01 DIAGNOSIS — D131 Benign neoplasm of stomach: Secondary | ICD-10-CM

## 2012-08-01 DIAGNOSIS — D649 Anemia, unspecified: Secondary | ICD-10-CM

## 2012-08-01 DIAGNOSIS — R197 Diarrhea, unspecified: Secondary | ICD-10-CM

## 2012-08-01 DIAGNOSIS — R1310 Dysphagia, unspecified: Secondary | ICD-10-CM

## 2012-08-01 LAB — CBC WITH DIFFERENTIAL/PLATELET
Basophils Relative: 0.4 % (ref 0.0–3.0)
Eosinophils Absolute: 0.1 10*3/uL (ref 0.0–0.7)
MCHC: 32.9 g/dL (ref 30.0–36.0)
MCV: 78 fl (ref 78.0–100.0)
Monocytes Absolute: 0.4 10*3/uL (ref 0.1–1.0)
Neutrophils Relative %: 72.2 % (ref 43.0–77.0)
Platelets: 306 10*3/uL (ref 150.0–400.0)
RBC: 4.13 Mil/uL (ref 3.87–5.11)

## 2012-08-01 MED ORDER — FLEET ENEMA 7-19 GM/118ML RE ENEM
2.0000 | ENEMA | RECTAL | Status: DC | PRN
  Administered 2012-08-01: 2 via RECTAL

## 2012-08-01 MED ORDER — SODIUM CHLORIDE 0.9 % IV SOLN: 500.0000 mL | INTRAVENOUS | Status: DC

## 2012-08-01 NOTE — Patient Instructions (Addendum)
The upper endoscopy exam showed some stomach polyps that look benign. They were biopsied and I will let you know.  The lack of response to the prep for colonoscopy suggests that you have actually been constipated and probably impacted in the colon, with loose stools that passed around the impaction.  I did a partial colon exam and no major problems seen but prep was not adequate.  I want to see if you are better after the cleanout - did the diarrhea go away?  Please call my office in a few days to 1 week and let me know how you are doing.  I am going to have you get a blood count and iron level today also.  Iva Boop, MD, [facility]  Discharge instructions given with verbal understanding. A dilatation diet given. Resume previous medications. Sent to labs after discharge. YOU HAD AN ENDOSCOPIC PROCEDURE TODAY AT THE [facility]: Refer to the procedure report that was given to you for any specific questions about what was found during the examination.  If the procedure report does not answer your questions, please call your gastroenterologist to clarify.  If you requested that your care partner not be given the details of your procedure findings, then the procedure report has been included in a sealed envelope for you to review at your convenience later.  YOU SHOULD EXPECT: Some feelings of bloating in the abdomen. Passage of more gas than usual.  Walking can help get rid of the air that was put into your GI tract during the procedure and reduce the bloating. If you had a lower endoscopy (such as a colonoscopy or flexible sigmoidoscopy) you may notice spotting of blood in your stool or on the toilet paper. If you underwent a bowel prep for your procedure, then you may not have a normal bowel movement for a few days.  DIET: Your first meal following the procedure should be a light meal and then it is ok to progress to your normal diet.  A half-sandwich or bowl of soup is an example  of a good first meal.  Heavy or fried foods are harder to digest and may make you feel nauseous or bloated.  Likewise meals heavy in dairy and vegetables can cause extra gas to form and this can also increase the bloating.  Drink plenty of fluids but you should avoid alcoholic beverages for 24 hours.  ACTIVITY: Your care partner should take you home directly after the procedure.  You should plan to take it easy, moving slowly for the rest of the day.  You can resume normal activity the day after the procedure however you should NOT DRIVE or use heavy machinery for 24 hours (because of the sedation medicines used during the test).    SYMPTOMS TO REPORT IMMEDIATELY: A gastroenterologist can be reached at any hour.  During normal business hours, 8:30 AM to 5:00 PM Monday through Friday, call [phone].  After hours and on weekends, please call the GI answering service at [phone] who will take a message and have the physician on call contact you.   Following lower endoscopy (colonoscopy or flexible sigmoidoscopy):  Excessive amounts of blood in the stool  Significant tenderness or worsening of abdominal pains  Swelling of the abdomen that is new, acute  Fever of 100F or higher  Following upper endoscopy (EGD)  Vomiting of blood or coffee ground material  New chest pain or pain under the shoulder blades  Painful or persistently difficult  swallowing  New shortness of breath  Fever of 100F or higher  Black, tarry-looking stools  FOLLOW UP: If any biopsies were taken you will be contacted by phone or by letter within the next 1-3 weeks.  Call your gastroenterologist if you have not heard about the biopsies in 3 weeks.  Our staff will call the home number listed on your records the next business day following your procedure to check on you and address any questions or concerns that you may have at that time regarding the information given to you following your procedure. This is a  courtesy call and so if there is no answer at the home number and we have not heard from you through the emergency physician on call, we will assume that you have returned to your regular daily activities without incident.  SIGNATURES/CONFIDENTIALITY: You and/or your care partner have signed paperwork which will be entered into your electronic medical record.  These signatures attest to the fact that that the information above on your After Visit Summary has been reviewed and is understood.  Full responsibility of the confidentiality of this discharge information lies with you and/or your care-partner.

## 2012-08-01 NOTE — Progress Notes (Signed)
Per the pt on arrival to admitting, she still had brown stool , not formed, in yellowish liquid.  Alease Frame, RN spoke with Dr. Leone Payor, give 2 fleet enema.  After the first enema I looked at the yellow liquid with still not formed stool in the toilet bowel.  The pt c/o her rectum burning and stinging.  She asked for vasoline.  Per Dr. Leone Payor she coulf use surg lub to her rectum.  If the pt did not feel she could do another enema, she did not have too.  I explained this to the pt , and she said she would do another enema, that she wanted to be cleaned out for the exam so he could see better.  The pt was given some surg lub to place on her rectum.  Maw

## 2012-08-01 NOTE — Op Note (Signed)
[facility] [address] [place], [address]   ENDOSCOPY PROCEDURE REPORT  PATIENT: Mariapaula, Krist  MR#: [id number] BIRTHDATE: Sep 08, 1961 , 50  yrs. old GENDER: Female ENDOSCOPIST: Iva Boop, MD, [facility] PROCEDURE DATE:  08/01/2012 PROCEDURE:  EGD w/ biopsy and Maloney dilation of esophagus ASA CLASS:     Class III INDICATIONS:  dysphagia. MEDICATIONS: Propofol (Diprivan) 140 mg IV, MAC sedation, administered by CRNA, and These medications were titrated to patient response per physician's verbal order TOPICAL ANESTHETIC: Cetacaine Spray  DESCRIPTION OF PROCEDURE: After the risks benefits and alternatives of the procedure were thoroughly explained, informed consent was obtained.  The LB [id number]-H180 [id number] endoscope was introduced through the mouth and advanced to the second portion of the duodenum. Without limitations.  The instrument was slowly withdrawn as the mucosa was fully examined.        STOMACH: Multiple smooth sessile polyps measuring 3-10 mm in size were found in the gastric fundus and gastric body.  Multiple biopsies was performed using cold forceps.  Sample sent for histology.   A Nissen fundoplication was found in the cardia characterized as healthy in appearance.  The remainder of the upper endoscopy exam was otherwise normal. Retroflexed views revealed no abnormalities.     The scope was then withdrawn from the patient and the procedure completed.  COMPLICATIONS: There were no complications. ENDOSCOPIC IMPRESSION: 1.   Multiple sessile polyps measuring 3-10 mm in size were found in the gastric fundus and gastric body -representative biopsies taken (suspect fundic gland polyps) 2.   Nissen fundoplication was found in the cardia 3.   The remainder of the upper endoscopy exam was otherwise normal  RECOMMENDATIONS: Clear liquids until  5 PM , then soft foods rest of day.  Resume prior diet tomorrow.   eSigned:  Iva Boop, MD,  [facility] 08/01/2012 3:16 PM   [id number] [name], MD and The Patient

## 2012-08-01 NOTE — Telephone Encounter (Signed)
Pt called in to endoscopy dept to say she has not started having bowel movements yet after completing 2nd part of her prep as instructed . Pt says she called in to Dr. On call last night since she had not had a bowel movement yet @ that time & was instructed to take dulcolax tabs & enemas which she did yet says she hasnt really had a bowel movement except after she did warm water enemas . Instructed pt to drink plenty of clear liquids,warm clear liquids if possible until 11 am ( her cut off time ) and to call us back and let us know if she has started having BMs by 12 noon ( her procedure time is 2pm) Discussed this with Dr. Leone Payor and called pt back to tell her to also take another enema if no BM by 11 am and to still call us back by 12 noon as Dr. Leone Payor instructed.

## 2012-08-01 NOTE — Op Note (Signed)
[facility] [address] [place], [address]   FLEXIBLE SIGMOIDOSCOPY PROCEDURE REPORT  PATIENT: Jo [name], Jo [name]  MR#: [id number] BIRTHDATE: [birth year] , 50  yrs. old GENDER: Female ENDOSCOPIST: Iva Boop, MD, [facility] PROCEDURE DATE:  08/01/2012 PROCEDURE:   Sigmoidoscopy, diagnostic ASA CLASS:   Class II INDICATIONS:unexplained diarrhea. MEDICATIONS: There was residual sedation effect present from prior procedure, Propofol (Diprivan) 90 mg IV, MAC sedation, administered by CRNA, and These medications were titrated to patient response per physician's verbal order  DESCRIPTION OF PROCEDURE:   After the risks benefits and alternatives of the procedure were thoroughly explained, informed consent was obtained.  revealed no abnormalities of the rectum. The endoscope was introduced through the anus  and advanced to the descending colon , limited by No adverse events experienced.   The quality of the prep was poor .  The instrument was then slowly withdrawn as the mucosa was fully examined.       COLON FINDINGS: The colonic mucosa appeared normal. Retroflexed views revealed no abnormalities.    The scope was then withdrawn from the patient and the procedure terminated.  COMPLICATIONS: There were no complications.  ENDOSCOPIC IMPRESSION: The colonic mucosa appeared normal but there was a poor prep. Colonoscopy converted to flexible sigmoidoscopy.  RECOMMENDATIONS: Let's see how she is after the prep - think she must have had a high impaction causing overflow diarrhea.     eSigned:  Iva Boop, MD, [facility] 08/01/2012 3:30 PM   [id number] [name], MD The Patient

## 2012-08-01 NOTE — Telephone Encounter (Signed)
Pt had a large bowel movement around 1100am and has continued to do warm water enemas.  She states that she is now only having clear liquid bowel movements.  Advised pt to arrive for procedure at scheduled time.

## 2012-08-01 NOTE — Progress Notes (Signed)
Patient did not experience any of the following events: a burn prior to discharge; a fall within the facility; wrong site/side/patient/procedure/implant event; or a hospital transfer or hospital admission upon discharge from the facility. (G8907) Patient did not have preoperative order for IV antibiotic SSI prophylaxis. (G8918)  

## 2012-08-01 NOTE — Progress Notes (Signed)
After the second enema, I looked at the toilet bowel.  There was liquid dark stool.  I notified Dr. Leone Payor, he said, no need to do any more enemas.  Will proceed with the procedures.  Maw

## 2012-08-01 NOTE — Progress Notes (Addendum)
Propofol per Fayrene Helper, CRNA. EWM  Pt with inadequate prep, lots of stool throughout colon, incomplete exam per Dr Leone Payor. EWM

## 2012-08-02 ENCOUNTER — Telehealth: Payer: Self-pay

## 2012-08-02 ENCOUNTER — Other Ambulatory Visit: Payer: Self-pay | Admitting: Internal Medicine

## 2012-08-02 MED ORDER — ONDANSETRON HCL 4 MG PO TABS: 4.0000 mg | ORAL_TABLET | Freq: Three times a day (TID) | ORAL | Status: DC | PRN

## 2012-08-02 MED ORDER — METOCLOPRAMIDE HCL 10 MG PO TABS: 10.0000 mg | ORAL_TABLET | Freq: Four times a day (QID) | ORAL | Status: DC | PRN

## 2012-08-02 NOTE — Telephone Encounter (Signed)
Spoke with Jo [name] and she states she feels the same as earlier this morning. Chest pain and spasms is still a 7 on scale of 1-10. She has been drinking warn liquids and has advanced to soft diet.  She states Dr Doreatha Martin had in the past given her a prescription for reglan and that helped with the spasms.  She is wondering if this would help again. She also is asking for a prescription for zofran. She states she is not nuaseated at present but her prescription is about to run out.  Informed her we would call her later this afternoon and see if she is any better and to inform her about prescriptions.

## 2012-08-02 NOTE — Telephone Encounter (Signed)
  Follow up Call-  Call back number 08/01/2012  Post procedure Call Back phone  # [phone] hm  Permission to leave phone message Yes     Patient questions:  Do you have a fever, pain , or abdominal swelling? yes Pain Score  7 *  Have you tolerated food without any problems? yes  Have you been able to return to your normal activities? no  Do you have any questions about your discharge instructions: Diet   no Medications  yes Follow up visit  no  Do you have questions or concerns about your Care? yes  Actions: * If pain score is 4 or above: No action needed, pain <4.  The pt c/o throat spasm in her esophagus and her chest pain. Pt rated pain 7/10.  Pt questioned what to do.  Pt requested rx for zofran 4 mg and Reglan she did not know the strength.  Rx  To be sent to [facility]

## 2012-08-02 NOTE — Telephone Encounter (Signed)
See if she can drink some warm water and gradually advance diet Follow-up later this PM I will refill meds later but want to see if she can advance her diet today - liquids to soft

## 2012-08-02 NOTE — Telephone Encounter (Signed)
Called back to check on patient.  Had to leave message on answering machine.  Informed her Dr Leone Payor had sent in rx for reglan and zofran.  Instructed her to call us back if she was not feeling any better.

## 2012-08-02 NOTE — Telephone Encounter (Signed)
I did Rx the Reglan and Zofran she can go get them

## 2012-08-03 NOTE — Progress Notes (Signed)
Quick Note:  Iron is low and can be contributing to fatigue I would like her to take ferrous sulfate 325 mg 1-2 daily - try 1 and see if she can add another  Need to reschedule colonoscopy (iron def anemia) but need to sort out if she can get completely prepped as prep was inadequate despite a history of diarrhea (? High impaction actually)  So would have her reschedule and do a double prep ______

## 2012-08-04 ENCOUNTER — Encounter: Payer: Self-pay | Admitting: Internal Medicine

## 2012-08-04 ENCOUNTER — Telehealth: Payer: Self-pay | Admitting: Internal Medicine

## 2012-08-04 MED ORDER — IRON 325 (65 FE) MG PO TABS: 325.0000 mg | ORAL_TABLET | Freq: Every day | ORAL | Status: DC

## 2012-08-04 NOTE — Telephone Encounter (Signed)
Left message for patient to call back  

## 2012-08-04 NOTE — Telephone Encounter (Signed)
Patient advised to try align or gas x.  Reviewed a low gas diet with her

## 2012-08-09 ENCOUNTER — Ambulatory Visit (AMBULATORY_SURGERY_CENTER): Payer: Medicare Other

## 2012-08-09 VITALS — Ht 63.0 in

## 2012-08-09 DIAGNOSIS — D649 Anemia, unspecified: Secondary | ICD-10-CM

## 2012-08-09 DIAGNOSIS — K2981 Duodenitis with bleeding: Secondary | ICD-10-CM

## 2012-08-09 DIAGNOSIS — Z86010 Personal history of colonic polyps: Secondary | ICD-10-CM

## 2012-08-09 DIAGNOSIS — Z8371 Family history of colonic polyps: Secondary | ICD-10-CM

## 2012-08-09 DIAGNOSIS — Z1211 Encounter for screening for malignant neoplasm of colon: Secondary | ICD-10-CM

## 2012-08-09 MED ORDER — MOVIPREP 100 G PO SOLR: ORAL | Status: DC

## 2012-08-30 ENCOUNTER — Telehealth: Payer: Self-pay | Admitting: Internal Medicine

## 2012-08-30 NOTE — Telephone Encounter (Signed)
I assured pt she needed to take all her prep of her 2 day prep.

## 2012-08-31 ENCOUNTER — Ambulatory Visit (AMBULATORY_SURGERY_CENTER): Payer: Medicare Other | Admitting: Internal Medicine

## 2012-08-31 ENCOUNTER — Encounter: Payer: Self-pay | Admitting: Internal Medicine

## 2012-08-31 VITALS — BP 110/70 | HR 57 | Temp 98.9°F | Resp 18 | Ht 63.0 in | Wt 181.0 lb

## 2012-08-31 DIAGNOSIS — D509 Iron deficiency anemia, unspecified: Secondary | ICD-10-CM

## 2012-08-31 DIAGNOSIS — D126 Benign neoplasm of colon, unspecified: Secondary | ICD-10-CM

## 2012-08-31 DIAGNOSIS — R197 Diarrhea, unspecified: Secondary | ICD-10-CM

## 2012-08-31 DIAGNOSIS — Z86010 Personal history of colonic polyps: Secondary | ICD-10-CM

## 2012-08-31 MED ORDER — SODIUM CHLORIDE 0.9 % IV SOLN: 500.0000 mL | INTRAVENOUS | Status: DC

## 2012-08-31 NOTE — Progress Notes (Signed)
Called to room to assist during endoscopic procedure.  Patient ID and intended procedure confirmed with present staff. Received instructions for my participation in the procedure from the performing physician.  

## 2012-08-31 NOTE — Op Note (Signed)
[facility] [address] [name], [address]   COLONOSCOPY PROCEDURE REPORT  PATIENT: Jo [name], Jo [name]  MR#: [id number] BIRTHDATE: 1961/08/09 , 51  yrs. old GENDER: Female ENDOSCOPIST: Iva Boop, MD, [facility] PROCEDURE DATE:  08/31/2012 PROCEDURE:   Colonoscopy with biopsy ASA CLASS:   Class III INDICATIONS:unexplained diarrhea, Iron Deficiency Anemia, and Patient's personal history of adenomatous colon polyps. MEDICATIONS: propofol (Diprivan) 150mg  IV, MAC sedation, administered by CRNA, and These medications were titrated to patient response per physician's verbal order  DESCRIPTION OF PROCEDURE:   After the risks benefits and alternatives of the procedure were thoroughly explained, informed consent was obtained.  A digital rectal exam revealed decreased sphincter tone.   The LB [id number]  endoscope was introduced through the anus and advanced to the terminal ileum which was intubated for a short distance. No adverse events experienced.   The quality of the prep was excellent, using MoviPrep  The instrument was then slowly withdrawn as the colon was fully examined.      COLON FINDINGS: The mucosa appeared normal in the terminal ileum. A normal appearing cecum, ileocecal valve, and appendiceal orifice were identified.  The ascending, hepatic flexure, transverse, splenic flexure, descending, sigmoid colon and rectum appeared unremarkable.  No polyps or cancers were seen.  Multiple random biopsies of the area were performed.  Retroflexed views revealed no abnormalities. The time to cecum=3 minutes 56 seconds.  Withdrawal time=8 minutes 47 seconds.  The scope was withdrawn and the procedure completed. COMPLICATIONS: There were no complications.  ENDOSCOPIC IMPRESSION: 1.   Normal mucosa in the terminal ileum 2.   Normal colon; multiple random biopsies of the area were performed 3.   Prior adenoma - 6 mm - 2008  RECOMMENDATIONS: 1.  Await  biopsy results and get copies of recent PCP labs that show multiple vitamin deficiencies 2.  Repeat Colonscopy in 10 years.   eSigned:  Iva Boop, MD, [facility] 08/31/2012 2:37 PM  cc: Georgianne Fick, MD and The Patient

## 2012-08-31 NOTE — Progress Notes (Signed)
Patient did not experience any of the following events: a burn prior to discharge; a fall within the facility; wrong site/side/patient/procedure/implant event; or a hospital transfer or hospital admission upon discharge from the facility. (G8907) Patient did not have preoperative order for IV antibiotic SSI prophylaxis. (G8918)  

## 2012-08-31 NOTE — Patient Instructions (Addendum)
The colonoscopy looked normal - I did take biopsies to see if there is microscopic colitis (inflammation in th colon) but I bet this is your IBS. Please stay on your iron. I will have my staff call about the biopsy results and plans.  Thank you for choosing me and Jo [name].  Iva Boop, MD, FACG   YOU HAD AN ENDOSCOPIC PROCEDURE TODAY AT THE [facility]: Refer to the procedure report that was given to you for any specific questions about what was found during the examination.  If the procedure report does not answer your questions, please call your gastroenterologist to clarify.  If you requested that your care partner not be given the details of your procedure findings, then the procedure report has been included in a sealed envelope for you to review at your convenience later.  YOU SHOULD EXPECT: Some feelings of bloating in the abdomen. Passage of more gas than usual.  Walking can help get rid of the air that was put into your GI tract during the procedure and reduce the bloating. If you had a lower endoscopy (such as a colonoscopy or flexible sigmoidoscopy) you may notice spotting of blood in your stool or on the toilet paper. If you underwent a bowel prep for your procedure, then you may not have a normal bowel movement for a few days.  DIET: Your first meal following the procedure should be a light meal and then it is ok to progress to your normal diet.  A half-sandwich or bowl of soup is an example of a good first meal.  Heavy or fried foods are harder to digest and may make you feel nauseous or bloated.  Likewise meals heavy in dairy and vegetables can cause extra gas to form and this can also increase the bloating.  Drink plenty of fluids but you should avoid alcoholic beverages for 24 hours.  ACTIVITY: Your care partner should take you home directly after the procedure.  You should plan to take it easy, moving slowly for the rest of the day.  You can resume  normal activity the day after the procedure however you should NOT DRIVE or use heavy machinery for 24 hours (because of the sedation medicines used during the test).    SYMPTOMS TO REPORT IMMEDIATELY: A gastroenterologist can be reached at any hour.  During normal business hours, 8:30 AM to 5:00 PM Monday through Friday, call [phone].  After hours and on weekends, please call the GI answering service at [phone] who will take a message and have the physician on call contact you.   Following lower endoscopy (colonoscopy or flexible sigmoidoscopy):  Excessive amounts of blood in the stool  Significant tenderness or worsening of abdominal pains  Swelling of the abdomen that is new, acute  Fever of 100F or higher  Following upper endoscopy (EGD)  Vomiting of blood or coffee ground material  New chest pain or pain under the shoulder blades  Painful or persistently difficult swallowing  New shortness of breath  Fever of 100F or higher  Black, tarry-looking stools  FOLLOW UP: If any biopsies were taken you will be contacted by phone or by letter within the next 1-3 weeks.  Call your gastroenterologist if you have not heard about the biopsies in 3 weeks.  Our staff will call the home number listed on your records the next business day following your procedure to check on you and address any questions or concerns that you may have  at that time regarding the information given to you following your procedure. This is a courtesy call and so if there is no answer at the home number and we have not heard from you through the emergency physician on call, we will assume that you have returned to your regular daily activities without incident.  SIGNATURES/CONFIDENTIALITY: You and/or your care partner have signed paperwork which will be entered into your electronic medical record.  These signatures attest to the fact that that the information above on your After Visit Summary has been reviewed  and is understood.  Full responsibility of the confidentiality of this discharge information lies with you and/or your care-partner.

## 2012-09-01 ENCOUNTER — Telehealth: Payer: Self-pay | Admitting: *Deleted

## 2012-09-01 NOTE — Telephone Encounter (Signed)
  Follow up Call-  Call back number 08/31/2012 08/01/2012  Post procedure Call Back phone  # [phone] hm  Permission to leave phone message Yes Yes     Patient questions:  Do you have a fever, pain , or abdominal swelling? no Pain Score  0 *  Have you tolerated food without any problems? yes  Have you been able to return to your normal activities? yes  Do you have any questions about your discharge instructions: Diet   no Medications  no Follow up visit  no  Do you have questions or concerns about your Care? no  Actions: * If pain score is 4 or above: No action needed, pain <4.

## 2012-09-05 ENCOUNTER — Encounter: Payer: Self-pay | Admitting: *Deleted

## 2012-09-05 NOTE — Telephone Encounter (Signed)
error 

## 2012-12-22 DIAGNOSIS — A048 Other specified bacterial intestinal infections: Secondary | ICD-10-CM | POA: Insufficient documentation

## 2012-12-26 ENCOUNTER — Telehealth: Payer: Self-pay | Admitting: Internal Medicine

## 2012-12-26 NOTE — Telephone Encounter (Signed)
Patient advised she will need an office visit to further discuss.  Office visit scheduled for 01/30/13 11:00

## 2012-12-27 ENCOUNTER — Telehealth: Payer: Self-pay | Admitting: Internal Medicine

## 2012-12-28 NOTE — Telephone Encounter (Signed)
Spoke to [place] at [facility] phone# [phone] to let her know that the ondansetron has been approved for 1 year , effective 12-26-12.  She said they already knew and patient has picked it up.

## 2013-01-09 ENCOUNTER — Other Ambulatory Visit: Payer: Self-pay

## 2013-01-09 DIAGNOSIS — Z1231 Encounter for screening mammogram for malignant neoplasm of breast: Secondary | ICD-10-CM

## 2013-01-30 ENCOUNTER — Ambulatory Visit: Payer: Medicare Other | Admitting: Internal Medicine

## 2013-02-09 ENCOUNTER — Ambulatory Visit
Admission: RE | Admit: 2013-02-09 | Discharge: 2013-02-09 | Disposition: A | Discharge status: Home / Self Care | Payer: Medicare Other | Source: Ambulatory Visit

## 2013-02-09 DIAGNOSIS — Z1231 Encounter for screening mammogram for malignant neoplasm of breast: Secondary | ICD-10-CM

## 2013-03-06 DIAGNOSIS — M79609 Pain in unspecified limb: Secondary | ICD-10-CM | POA: Insufficient documentation

## 2013-03-06 DIAGNOSIS — IMO0002 Reserved for concepts with insufficient information to code with codable children: Secondary | ICD-10-CM | POA: Insufficient documentation

## 2013-05-04 DIAGNOSIS — E039 Hypothyroidism, unspecified: Secondary | ICD-10-CM | POA: Insufficient documentation

## 2013-07-11 DIAGNOSIS — E282 Polycystic ovarian syndrome: Secondary | ICD-10-CM | POA: Insufficient documentation

## 2014-04-25 ENCOUNTER — Encounter: Payer: Self-pay | Admitting: Internal Medicine

## 2014-06-25 ENCOUNTER — Other Ambulatory Visit: Payer: Self-pay

## 2014-06-25 ENCOUNTER — Other Ambulatory Visit (HOSPITAL_COMMUNITY): Payer: Self-pay | Admitting: Physician Assistant

## 2014-06-25 DIAGNOSIS — Z1231 Encounter for screening mammogram for malignant neoplasm of breast: Secondary | ICD-10-CM

## 2014-07-04 ENCOUNTER — Ambulatory Visit (HOSPITAL_COMMUNITY)
Admission: RE | Admit: 2014-07-04 | Discharge: 2014-07-04 | Disposition: A | Discharge status: Home / Self Care | Payer: Medicare Other | Source: Ambulatory Visit | Attending: Physician Assistant | Admitting: Physician Assistant

## 2014-07-04 ENCOUNTER — Ambulatory Visit: Payer: Medicare Other

## 2014-07-04 DIAGNOSIS — Z1231 Encounter for screening mammogram for malignant neoplasm of breast: Secondary | ICD-10-CM | POA: Diagnosis present

## 2015-09-03 ENCOUNTER — Ambulatory Visit: Payer: Medicare Other | Admitting: Neurology

## 2015-09-25 ENCOUNTER — Encounter: Payer: Self-pay | Admitting: *Deleted

## 2015-09-25 ENCOUNTER — Encounter: Payer: Self-pay | Admitting: Neurology

## 2015-09-25 ENCOUNTER — Encounter (INDEPENDENT_AMBULATORY_CARE_PROVIDER_SITE_OTHER): Payer: Self-pay

## 2015-09-25 ENCOUNTER — Ambulatory Visit (INDEPENDENT_AMBULATORY_CARE_PROVIDER_SITE_OTHER): Payer: PPO | Admitting: Neurology

## 2015-09-25 VITALS — BP 105/71 | HR 81 | Ht 63.75 in | Wt 172.8 lb

## 2015-09-25 DIAGNOSIS — M542 Cervicalgia: Secondary | ICD-10-CM | POA: Diagnosis not present

## 2015-09-25 DIAGNOSIS — R413 Other amnesia: Secondary | ICD-10-CM | POA: Insufficient documentation

## 2015-09-25 DIAGNOSIS — R519 Headache, unspecified: Secondary | ICD-10-CM | POA: Insufficient documentation

## 2015-09-25 DIAGNOSIS — R51 Headache: Secondary | ICD-10-CM | POA: Diagnosis not present

## 2015-09-25 DIAGNOSIS — H538 Other visual disturbances: Secondary | ICD-10-CM | POA: Insufficient documentation

## 2015-09-25 DIAGNOSIS — G8929 Other chronic pain: Secondary | ICD-10-CM | POA: Insufficient documentation

## 2015-09-25 DIAGNOSIS — W19XXXA Unspecified fall, initial encounter: Secondary | ICD-10-CM

## 2015-09-25 NOTE — Patient Instructions (Signed)
Overall you are doing fairly well but I do want to suggest a few things today:   Remember to drink plenty of fluid, eat healthy meals and do not skip any meals. Try to eat protein with a every meal and eat a healthy snack such as fruit or nuts in between meals. Try to keep a regular sleep-wake schedule and try to exercise daily, particularly in the form of walking, 20-30 minutes a day, if you can.   As far as diagnostic testing: MRI of the brain..   Our phone number is [phone]. We also have an after hours call service for urgent matters and there is a physician on-call for urgent questions. For any emergencies you know to call 911 or go to the nearest emergency room

## 2015-09-25 NOTE — Progress Notes (Signed)
[facility]    Provider:  Dr Jaynee Eagles Referring Provider: Merrilee Seashore, MD Primary Care Physician:  Chesley Noon, MD  CC:  Headache  HPI:  Jo [name] is a 55 y.o. female here as a referral from Dr. Ashby Dawes for headache and neck pain. She has chronic pain and sees a pain doctor in [place]. PMHx RSD, chronic pain involving the right foot and LE, neck pain. She had bilateral pain in the back of the head and headaches in the top of the head daily. She had soreness in different places around the head. Painful to touch and wash her hair. He balance is poor and she has blurry vision. She fell 3 times in 4 months. She says she has asked her doctors to take her off of all her narcotic medications and the doctors refuse because  Really needs them.  Headaches started in September bad pain in the back of the head and that got better by the end of November, it turned off overnight. Now she has daily headaches,, aching all over the front of the face. All over the forehead. She wakes up with the headaches. Daily continuous. They can be 7-8/10. Currently it is about a 7/10. Artificial light hurts her eyes but outside light is ok. She gets nausea but no vomiting. Her balance is poor, she can't stand on one leg. She has not been to PT for gait and balance. She has neck pain and cervical muscle tightness. She has memory changes. She has a family history of Progressive Supranuclear Palsy and she is worried she has it since she has fallen a few times.   Reviewed notes, labs and imaging from outside physicians, which showed:  MR of the cervical spine: mild degenerative changes at c4-c5 and c5-c6 without significant stenosis at any level. 07/2015.   MR brain 2004: MRI BRAIN WITH AND WITHOUT CONTRAST: AXIAL T2-WEIGHTED IMAGES DEMONSTRATE NORMAL VENTRICLES, CISTERNS AND SULCI. FLAIR IMAGES SHOW NO ABNORMAL SIGNAL IN THE CORTEX OR WHITE MATTER. DIFFUSION IMAGES ARE NEGATIVE. THIN-SECTION  T1-WEIGHTED IMAGES WERE OBTAINED THROUGH THE PITUITARY GLAND BEFORE AND AFTER ADMINISTRATION OF [place] OMNISCAN IN ADDITION DYNAMIC SCANNING WAS PERFORMED THROUGH THE GLAND. GLAND HEIGHT IS WITHIN NORMAL LIMITS APPROXIMATELY 7-8MM. THE PITUITARY STALK IS MIDLINE AND THE GLAND ENHANCES IN A HOMOGENEOUS FASHION. NO EVIDENCE FOR EROSION OF THE FLOOR OF THE SELLA TURCICA. NO FOCAL LESIONS ARE SEEN WITHIN THE GLAND. THE PARASELLAR REGIONS ARE NORMAL INCLUDING CAVERNOUS SINUS AND SUPRASELLAR CISTERN. OPTIC CHIASM IS LOCATED IN THE NORMAL POSITION. THE SPHENOID SINUS IS WELL AERATED. IMPRESSION NEGATIVE MRI OF THE BRAIN WITH SPECIAL ATTENTION TO THE PITUITARY; SPECIFICALLY NO EVIDENCE FOR PITUITARY MICRO- OR MACROADENOMA COULD BE IDENTIFIED.  She saw Dr. Lynann Bologna for unbearable neck pain, she was crying in the office. Neck pain started about  A month previously (appt 07/2015), pain throughout the neck and the back of the head to the top of the head, reported she could hardly move her neck without pain. She had treatments of injections in her neck at [facility] pain management clinic. Symptoms worsening. MRI of the cervical spine was unremarkable and she was sent to neurology for further evaluation. She has chronic pain involving her right foot, ankle up to knees and thigh.   Review of Systems: Patient complains of symptoms per HPI as well as the following symptoms: No CP, no SOB. Pertinent negatives per HPI. All others negative.   Social History   Social History  . Marital Status: Divorced  Spouse Name: N/A  . Number of Children: 0  . Years of Education: 16   Occupational History  . flight attendant-disabled    Social History Main Topics  . Smoking status: Never Smoker   . Smokeless tobacco: Never Used  . Alcohol Use: No  . Drug Use: No  . Sexual Activity: Not on file   Other Topics Concern  . Not on file   Social History Narrative   Lives at home alone.   Caffeine use: Drinks coffee- 1  cup in morning   Ginger ale    Family History  Problem Relation Age of Onset  . Colon polyps Mother     part of intestines removed  . Raynaud syndrome Mother   . Irritable bowel syndrome Mother   . Other Mother     cold agluten  . Colon polyps Brother   . Lung cancer Maternal Grandfather   . Kidney Stones Brother   . Colon cancer Neg Hx   . Esophageal cancer Neg Hx   . Rectal cancer Neg Hx   . Stomach cancer Neg Hx   . Migraines Neg Hx   . Seizures Neg Hx   . Stroke Neg Hx   . Breast cancer Maternal Aunt     Past Medical History  Diagnosis Date  . GERD (gastroesophageal reflux disease)   . Asthma   . Anxiety and depression   . Irritable bowel syndrome (IBS)   . Obesity   . H/O gastric ulcer 1985  . History of anorexia nervosa   . History of bulimia   . Anemia   . Gastritis   . Colon polyp   . Fundic gland polyps of stomach, benign   . H. pylori infection 2013  . Arthritis   . Chronic headaches   . History of pneumonia   . COPD (chronic obstructive pulmonary disease) (HCC)     no o2  . RSD (reflex sympathetic dystrophy)   . Hypothyroidism   . Allergy   . Anxiety   . Depression   . Ulcer     gastric, duodenal ulcer  . RSD (reflex sympathetic dystrophy)     Past Surgical History  Procedure Laterality Date  . Thoracoscopy w/ thoracic sympathectomy      for RSD  . Cholecystectomy    . Nasal sinus surgery    . Tonsillectomy    . Knee arthroscopy  04/21/2012    right  . Cervix surgery    . Laparoscopic nissen fundoplication  [id number]    Dr. Johnathan Hausen  . Lumbar spondylosis injected  2013    Dr. Mina Marble  . Colonoscopy    . Upper gastrointestinal endoscopy      Current Outpatient Prescriptions  Medication Sig Dispense Refill  . busPIRone (BUSPAR) 15 MG tablet Take 1/2 tablet twice a day    . cholecalciferol (VITAMIN D) 1000 UNITS tablet Take 2,000 Units by mouth daily.    . Cranberry 200 MG CAPS Take 200 mg by mouth 2 (two) times daily.    .  cyclobenzaprine (FLEXERIL) 10 MG tablet Take 10 mg by mouth 2 (two) times daily as needed.    . desloratadine (CLARINEX) 5 MG tablet Take 5 mg by mouth daily.    Marland Kitchen doxepin (SINEQUAN) 10 MG capsule Take 20 mg by mouth at bedtime.     Marland Kitchen ESTRADIOL ACETATE PO Take 1 tablet by mouth daily. 1 mg tablets    . HYDROmorphone (DILAUDID) 4 MG tablet Take 4 mg by  mouth every 4 (four) hours as needed.    . lamoTRIgine (LAMICTAL) 100 MG tablet Take 100 mg by mouth 2 (two) times daily.     Marland Kitchen levothyroxine (SYNTHROID, LEVOTHROID) 100 MCG tablet Take 100 mcg by mouth daily.    . medroxyPROGESTERone (PROVERA) 2.5 MG tablet Take 2.5 mg by mouth. 1 tab every other day  2  . metFORMIN (GLUCOPHAGE) 500 MG tablet Take 1 tablet by mouth in the morning and 1/2 at night    . methadone (DOLOPHINE) 10 MG tablet Take 2 tablets four times a day    . montelukast (SINGULAIR) 10 MG tablet Take 10 mg by mouth at bedtime.    Earney Navy Bicarbonate (ZEGERID PO) Take 1 capsule by mouth daily.     . ondansetron (ZOFRAN) 4 MG tablet Take 1 tablet (4 mg total) by mouth every 8 (eight) hours as needed for nausea. 20 tablet 2  . ranitidine (ZANTAC) 150 MG tablet Take 150 mg by mouth 2 (two) times daily.  3  . tiZANidine (ZANAFLEX) 4 MG capsule Take 4 mg by mouth 3 (three) times daily.    Marland Kitchen topiramate (TOPAMAX) 50 MG tablet Take 50 mg by mouth. 1 tab every morning and 3 tab at bed time    . metoCLOPramide (REGLAN) 10 MG tablet Take 1 tablet (10 mg total) by mouth 4 (four) times daily as needed (nausea, GI spasm). 60 tablet 0   No current facility-administered medications for this visit.    Allergies as of 09/25/2015 - Review Complete 09/25/2015  Allergen Reaction Noted  . Bee pollen Anaphylaxis 09/25/2015  . Diazepam    . Prednisone Nausea And Vomiting 09/25/2015  . Codeine    . Doxycycline  07/21/2012  . Erythromycin    . Morphine    . Propoxyphene n-acetaminophen    . Sulfa antibiotics  07/21/2012  . Tetracyclines &  related  07/21/2012  . Other Other (See Comments) 09/25/2015  . Propoxyphene Other (See Comments) 09/25/2015    Vitals: BP 105/71 mmHg  Pulse 81  Ht 5' 3.75" (1.619 m)  Wt 172 lb 12.8 oz (78.382 kg)  BMI 29.90 kg/m2 Last Weight:  Wt Readings from Last 1 Encounters:  09/25/15 172 lb 12.8 oz (78.382 kg)   Last Height:   Ht Readings from Last 1 Encounters:  09/25/15 5' 3.75" (1.619 m)    Physical exam: Exam: Gen: NAD, conversant, well nourised, obese, well groomed                     CV: RRR, no MRG. No Carotid Bruits. No peripheral edema, warm, nontender Eyes: Conjunctivae clear without exudates or hemorrhage  Neuro: Detailed Neurologic Exam  Speech:    Speech is normal; fluent and spontaneous with normal comprehension.  Cognition:    The patient is oriented to person, place, and time;     recent and remote memory intact;     language fluent;     normal attention, concentration,     fund of knowledge Cranial Nerves:    The pupils are equal, round, and reactive to light. The fundi are flat. Visual fields are full to finger confrontation. Extraocular movements are intact. Trigeminal sensation is intact and the muscles of mastication are normal. The face is symmetric. The palate elevates in the midline. Hearing intact. Voice is normal. Shoulder shrug is normal. The tongue has normal motion without fasciculations.   Coordination:    Normal finger to nose and heel to shin. Normal rapid  alternating movements.   Gait:    Heel-toe and tandem gait are normal.   Motor Observation:    No asymmetry, no atrophy, and no involuntary movements noted. Tone:    Normal muscle tone.    Posture:    Posture is normal. normal erect    Strength:    Strength is V/V in the upper and lower limbs.      Sensation: intact to LT     Reflex Exam:  DTR's:    Deep tendon reflexes in the upper and lower extremities are normal bilaterally.   Toes:    The toes are downgoing bilaterally.     Clonus:    Clonus is absent.      Assessment/Plan:  55 year old with multiple complaints including neck pain, headache, memory changes, blurry vision, falls. Neuro exam is non focal.   Headache: Frontal, bilateral. Not migrainous. MRI of the brain. She is on a tremendous amount of medication including chronic narcotics, already on multiple meds that treat migraines/headaches. Don't want to add another medication or make changes to her current pain regimen, feel that she can follow with her pain management clinic if MRi is unrevealing. May be a component of medication overuse as well.  Falls; MRI of the brain. May be multifactorial including polypharmacy. Will order PT gait and safety eval. Neck pain: PT for musculoskeletal neck pain may be contributing to headache.   CC: Dr. Melford Aase MD  Sarina Ill, MD  [facility] [address] [place], [place] [address]  Phone [phone] Fax [phone]

## 2015-10-04 ENCOUNTER — Other Ambulatory Visit: Payer: Self-pay

## 2015-10-04 DIAGNOSIS — Z1231 Encounter for screening mammogram for malignant neoplasm of breast: Secondary | ICD-10-CM

## 2015-10-09 DIAGNOSIS — J3089 Other allergic rhinitis: Secondary | ICD-10-CM | POA: Diagnosis not present

## 2015-10-09 DIAGNOSIS — E039 Hypothyroidism, unspecified: Secondary | ICD-10-CM | POA: Diagnosis not present

## 2015-10-09 DIAGNOSIS — Z23 Encounter for immunization: Secondary | ICD-10-CM | POA: Diagnosis not present

## 2015-10-09 DIAGNOSIS — K219 Gastro-esophageal reflux disease without esophagitis: Secondary | ICD-10-CM | POA: Diagnosis not present

## 2015-10-09 DIAGNOSIS — R51 Headache: Secondary | ICD-10-CM | POA: Diagnosis not present

## 2015-10-15 ENCOUNTER — Ambulatory Visit
Admission: RE | Admit: 2015-10-15 | Discharge: 2015-10-15 | Disposition: A | Discharge status: Home / Self Care | Payer: PPO | Source: Ambulatory Visit | Attending: Neurology | Admitting: Neurology

## 2015-10-15 ENCOUNTER — Ambulatory Visit
Admission: RE | Admit: 2015-10-15 | Discharge: 2015-10-15 | Disposition: A | Discharge status: Home / Self Care | Payer: PPO | Source: Ambulatory Visit

## 2015-10-15 DIAGNOSIS — W19XXXA Unspecified fall, initial encounter: Secondary | ICD-10-CM

## 2015-10-15 DIAGNOSIS — R413 Other amnesia: Secondary | ICD-10-CM

## 2015-10-15 DIAGNOSIS — R51 Headache: Secondary | ICD-10-CM | POA: Diagnosis not present

## 2015-10-15 DIAGNOSIS — R519 Headache, unspecified: Secondary | ICD-10-CM

## 2015-10-15 DIAGNOSIS — Z1231 Encounter for screening mammogram for malignant neoplasm of breast: Secondary | ICD-10-CM | POA: Diagnosis not present

## 2015-10-15 DIAGNOSIS — H538 Other visual disturbances: Secondary | ICD-10-CM

## 2015-10-21 ENCOUNTER — Telehealth: Payer: Self-pay | Admitting: *Deleted

## 2015-10-21 NOTE — Telephone Encounter (Signed)
Called pt and spoke to her about normal MRI brain per Dr Jaynee Eagles. Pt has not heard from PT. Gave number for her to call and schedule. [phone]. She verbalized understanding.

## 2015-10-21 NOTE — Telephone Encounter (Signed)
-----   Message from Melvenia Beam, MD sent at 10/19/2015 10:37 PM EST ----- MRi of the brain normal

## 2015-10-29 ENCOUNTER — Encounter: Payer: Self-pay | Admitting: Physical Therapy

## 2015-10-29 ENCOUNTER — Ambulatory Visit: Payer: PPO | Attending: Neurology | Admitting: Physical Therapy

## 2015-10-29 DIAGNOSIS — M542 Cervicalgia: Secondary | ICD-10-CM | POA: Insufficient documentation

## 2015-10-29 DIAGNOSIS — M5382 Other specified dorsopathies, cervical region: Secondary | ICD-10-CM | POA: Diagnosis not present

## 2015-10-29 DIAGNOSIS — R51 Headache: Secondary | ICD-10-CM | POA: Insufficient documentation

## 2015-10-29 DIAGNOSIS — R2689 Other abnormalities of gait and mobility: Secondary | ICD-10-CM

## 2015-10-29 DIAGNOSIS — G4489 Other headache syndrome: Secondary | ICD-10-CM | POA: Insufficient documentation

## 2015-10-29 DIAGNOSIS — R269 Unspecified abnormalities of gait and mobility: Secondary | ICD-10-CM | POA: Diagnosis not present

## 2015-10-29 DIAGNOSIS — M7582 Other shoulder lesions, left shoulder: Secondary | ICD-10-CM | POA: Insufficient documentation

## 2015-10-29 DIAGNOSIS — R6889 Other general symptoms and signs: Secondary | ICD-10-CM | POA: Diagnosis not present

## 2015-10-29 DIAGNOSIS — M25612 Stiffness of left shoulder, not elsewhere classified: Secondary | ICD-10-CM

## 2015-10-29 DIAGNOSIS — R2681 Unsteadiness on feet: Secondary | ICD-10-CM | POA: Diagnosis not present

## 2015-10-29 DIAGNOSIS — R29818 Other symptoms and signs involving the nervous system: Secondary | ICD-10-CM | POA: Diagnosis not present

## 2015-10-29 DIAGNOSIS — R29898 Other symptoms and signs involving the musculoskeletal system: Secondary | ICD-10-CM | POA: Diagnosis not present

## 2015-10-29 DIAGNOSIS — R519 Headache, unspecified: Secondary | ICD-10-CM

## 2015-10-30 ENCOUNTER — Ambulatory Visit: Payer: PPO | Admitting: Physical Therapy

## 2015-10-30 ENCOUNTER — Encounter: Payer: Self-pay | Admitting: Physical Therapy

## 2015-10-30 DIAGNOSIS — R51 Headache: Secondary | ICD-10-CM

## 2015-10-30 DIAGNOSIS — R2681 Unsteadiness on feet: Secondary | ICD-10-CM

## 2015-10-30 DIAGNOSIS — R269 Unspecified abnormalities of gait and mobility: Secondary | ICD-10-CM

## 2015-10-30 DIAGNOSIS — R29898 Other symptoms and signs involving the musculoskeletal system: Secondary | ICD-10-CM

## 2015-10-30 DIAGNOSIS — M542 Cervicalgia: Secondary | ICD-10-CM | POA: Diagnosis not present

## 2015-10-30 DIAGNOSIS — G4489 Other headache syndrome: Secondary | ICD-10-CM

## 2015-10-30 DIAGNOSIS — R519 Headache, unspecified: Secondary | ICD-10-CM

## 2015-10-30 DIAGNOSIS — R6889 Other general symptoms and signs: Secondary | ICD-10-CM
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
## 2015-10-30 NOTE — Patient Instructions (Signed)
Roll    Inhale and bring shoulders up, back, then exhale and relax shoulders down. Repeat __10_ times. Do _1-2__ times per day.  Copyright  VHI. All rights reserved.  Upper Cervical Rotation    Rotate head slowly from side to side as if saying "no". Do not turn head completely to either side, just until a gentle stretch is felt. Hold for 3-5 seconds. Repeat _10_ times each .Do _1-2_ sessions per day.  http://orth.exer.us/374   Copyright  VHI. All rights reserved.  AROM: Neck Extension    Move head up and down in pain free ranges.   Repeat _10_ times per set. Do 1_ sets per session. Do _1-2_ sessions per day. http://orth.exer.us/300   Copyright  VHI. All rights reserved.  Upper Limb Neural Tension: Radial I    Place right arm across low back and turn head down toward other side looking at the left axillary area. Hold for 20 seconds. Repeat to other side with left arm across low back and looking toward right axillary area.  Hold each stretch for 20 seconds. Repeat x 3 each way. Do _1-2_ sessions per day.  http://orth.exer.us/408   Copyright  VHI. All rights reserved.  Upper Limb Neural Tension: Radial II    Grasp stationary object or sit on hand with palm towards you and bend head away form anchored side, keeping shoulder girdle down. Hold __20__ seconds. Repeat __3__ times each way. Do _1___ sets per session. Do __1-2__ sessions per day.  http://orth.exer.us/410   Copyright  VHI. All rights reserved.

## 2015-10-30 NOTE — Therapy (Signed)
[facility] [address] [place], [place], [address] Phone: [phone]   Fax:  [phone]  Physical Therapy Treatment  Patient Details  Name: Jo [name] MRN: [id number] Date of Birth: 1960/11/02 Referring Provider: Sarina Ill, MD  Encounter Date: 10/30/2015   10/30/15 1452  PT Visits / Re-Eval  Visit Number 2  Number of Visits 18  Date for PT Re-Evaluation 12/27/15  Authorization  Authorization Type Medicare G-Code & Progress note every 10th visit  PT Time Calculation  PT Start Time 1447  PT Stop Time 1530  PT Time Calculation (min) 43 min  PT - End of Session  Activity Tolerance Patient limited by pain  Behavior During Therapy [facility] for tasks assessed/performed    Past Medical History  Diagnosis Date  . GERD (gastroesophageal reflux disease)   . Asthma   . Anxiety and depression   . Irritable bowel syndrome (IBS)   . Obesity   . H/O gastric ulcer 1985  . History of anorexia nervosa   . History of bulimia   . Anemia   . Gastritis   . Colon polyp   . Fundic gland polyps of stomach, benign   . H. pylori infection 2013  . Arthritis   . Chronic headaches   . History of pneumonia   . COPD (chronic obstructive pulmonary disease) (HCC)     no o2  . RSD (reflex sympathetic dystrophy)   . Hypothyroidism   . Allergy   . Anxiety   . Depression   . Ulcer     gastric, duodenal ulcer  . RSD (reflex sympathetic dystrophy)     Past Surgical History  Procedure Laterality Date  . Thoracoscopy w/ thoracic sympathectomy      for RSD  . Cholecystectomy    . Nasal sinus surgery    . Tonsillectomy    . Knee arthroscopy  04/21/2012    right  . Cervix surgery    . Laparoscopic nissen fundoplication  [id number]    Dr. Johnathan Hausen  . Lumbar spondylosis injected  2013    Dr. Mina Marble  . Colonoscopy    . Upper gastrointestinal endoscopy      There were no vitals filed for this visit.  Visit Diagnosis:    Other headache syndrome   3.  Scalp pain     Decreased functional activity tolerance   .  Decreased range of motion of neck   .  Unsteadiness   .  Abnormality of gait       Subjective Assessment - 10/30/15 1450    Subjective Having increased pain today since yesterday's session.    Patient Stated Goals She would like to walk without falling. Get rid of pain in head, be able to sleep.    Currently in Pain? Yes   Pain Score 9    Pain Location Generalized   Pain Orientation Left;Upper;Mid   Pain Descriptors / Indicators Aching;Sore;Burning   Pain Type Chronic pain   Pain Radiating Towards down left arm   Pain Onset More than a month ago   Pain Frequency Constant   Aggravating Factors  laying down   Pain Relieving Factors getting up           [facility] Adult PT Treatment/Exercise - 10/30/15 1453    Ambulation/Gait   Ambulation/Gait Yes   Ambulation/Gait Assistance 5: Supervision   Ambulation Distance (Feet) 100 Feet   Assistive device Straight cane   Gait Pattern Step-through pattern;Decreased arm swing - left;Decreased  stride length;Left hip hike;Antalgic;Lateral hip instability;Wide base of support   Ambulation Surface Level;Indoor   Gait velocity 10.94 sec's= 3.0 ft/sec with cane   Timed Up and Go Test   TUG Normal TUG   Normal TUG (seconds) 10.07   Manual Therapy   Myofascial Release to upper trap, rhomboid and cervical muscles on left>right   Other Manual Therapy passive stretching to bil upper traps, scalenes and STM muscles            PT Education - 10/30/15 1527    Education provided Yes   Education Details HEP: for cervical stretching   Person(s) Educated Patient   Methods Explanation;Demonstration;Verbal cues;Handout   Comprehension Verbalized understanding;Verbal cues required;Returned demonstration;Need further instruction          PT Short Term Goals - 10/29/15 1530    PT SHORT TERM GOAL #1   Title Patient demonstrates understanding of  initial HEP. (Target Date: 11/28/2015)   Time 1   Period Months   Status New   PT SHORT TERM GOAL #2   Title Patient reports scalp /head and upper cervical pain improved 25%. (Target Date: 11/28/2015)   Time 1   Period Months   Status New   PT SHORT TERM GOAL #3   Title Cervical AROM >/= 75% of full AROM in all directions. (Target Date: 11/28/2015)   Time 1   Period Months   Status New   PT SHORT TERM GOAL #4   Title Perform Timed Up - Go & set STG.    Time 1   Period Months   Status New   PT SHORT TERM GOAL #5   Title Patient ambulates 350' with single point cane with supervision including negotiating ramps & curbs. (Target Date: 11/28/2015)   Time 1   Period Months   Status New           PT Long Term Goals - 10/29/15 1540    PT LONG TERM GOAL #1   Title Patient verbalizes & demonstrates ongoing HEP for flexibility, strength, balance & endurance. (Target Date: 12/27/2015)   Time 2   Period Months   Status New   PT LONG TERM GOAL #2   Title Patient reports head / scalp and upper cervical pain 50% improvement to enable improved sleep. (Target Date: 12/27/2015)   Time 2   Period Months   Status New   PT LONG TERM GOAL #3   Title Neck Disability Index improved 10% per patient report via FOTO. (Target Date: 12/27/2015)   Time 2   Period Months   Status New   PT LONG TERM GOAL #4   Title Berg Balance > 45/56 to indicate lower fall risk. (Target Date: 12/27/2015)   Time 2   Period Months   Status New   PT LONG TERM GOAL #5   Title Patient ambulates 500' including grass, ramps, curbs & stairs (1 rail) with single point cane modified independent. (Target Date: 12/27/2015)   Time 2   Period Months   Status New      10/30/15 1452  Plan  Clinical Impression Statement Skilled session focused on pain reduction with manual techniques and establishing HEP for cervical stretching. Gait speed and timed up and go tests performed to establish baseline values, PT to address these as needed in  goals.  Pt will benefit from skilled therapeutic intervention in order to improve on the following deficits Abnormal gait;Decreased activity tolerance;Decreased balance;Decreased coordination;Decreased endurance;Decreased knowledge of use of DME;Decreased mobility;Decreased range of  motion;Decreased strength;Dizziness;Impaired flexibility;Pain  Rehab Potential Good  Clinical Impairments Affecting Rehab Potential Chronic pain including RSD  PT Frequency 2x / week  PT Duration (9 weeks (60 days))  PT Treatment/Interventions ADLs/Self Care Home Management;Biofeedback;Electrical Stimulation;Moist Heat;Traction;Ultrasound;DME Instruction;Gait training;Stair training;Neuromuscular re-education;Functional mobility training;Therapeutic activities;Therapeutic exercise;Balance training;Patient/family education;Manual techniques;Passive range of motion;Energy conservation;Vestibular  PT Next Visit Plan trial modalities for pain reduction, manual therapy as tolerated, gentle stretching progressing to strengthening, incorporate balance activities as well  Consulted and Agree with Plan of Care Patient      Problem List Patient Active Problem List   Diagnosis Date Noted  . Falls 09/25/2015  . Musculoskeletal neck pain 09/25/2015  . Headache 09/25/2015  . Memory changes 09/25/2015  . Blurry vision 09/25/2015  . Helicobacter pylori (H. pylori) 12/22/2012  . REFLEX SYMPATHETIC DYSTROPHY 02/26/2009  . Personal history of colonic adenoma 02/25/2009  . PERSONAL HISTORY OF FAILED MODERATE SEDATION 02/25/2009  . HYPOTHYROIDISM 01/14/2008  . OBESITY 01/14/2008  . DEPRESSION/ANXIETY 01/14/2008  . ASTHMA 01/14/2008  . GERD 01/14/2008  . IRRITABLE BOWEL SYNDROME 01/14/2008  . ANOREXIA NERVOSA, HX OF 01/14/2008    Willow Ora 10/30/2015, 3:27 PM  Willow Ora, PTA, [facility] [address], [place], [place] [address] [phone] 10/30/2015, 3:28 PM   Name: SIONNA MUCHNICK MRN: [id number] Date of Birth: 31-Aug-1961

## 2015-11-05 ENCOUNTER — Ambulatory Visit: Payer: PPO | Admitting: Physical Therapy

## 2015-11-05 ENCOUNTER — Encounter: Payer: Self-pay | Admitting: Physical Therapy

## 2015-11-05 DIAGNOSIS — R51 Headache: Secondary | ICD-10-CM

## 2015-11-05 DIAGNOSIS — R519 Headache, unspecified: Secondary | ICD-10-CM

## 2015-11-05 DIAGNOSIS — G4489 Other headache syndrome: Secondary | ICD-10-CM

## 2015-11-05 DIAGNOSIS — R6889 Other general symptoms and signs: Secondary | ICD-10-CM

## 2015-11-05 DIAGNOSIS — M542 Cervicalgia: Secondary | ICD-10-CM | POA: Diagnosis not present

## 2015-11-05 DIAGNOSIS — M5382 Other specified dorsopathies, cervical region: Secondary | ICD-10-CM

## 2015-11-05 DIAGNOSIS — R29898 Other symptoms and signs involving the musculoskeletal system: Secondary | ICD-10-CM

## 2015-11-05 DIAGNOSIS — R2681 Unsteadiness on feet: Secondary | ICD-10-CM

## 2015-11-05 NOTE — Therapy (Signed)
[facility] [address] [place], [place], [address] Phone: [phone]   Fax:  [phone]  Physical Therapy Treatment  Patient Details  Name: Jo [name] MRN: [id number] Date of Birth: 1961/03/09 Referring Provider: Sarina Ill, MD  Encounter Date: 11/05/2015      PT End of Session - 11/05/15 1111    Visit Number 3   Number of Visits 18   Date for PT Re-Evaluation 12/27/15   Authorization Type Medicare G-Code & Progress note every 10th visit   PT Start Time 1107  pt late for today   PT Stop Time 1145   PT Time Calculation (min) 38 min   Activity Tolerance Patient limited by pain   Behavior During Therapy [facility] for tasks assessed/performed      Past Medical History  Diagnosis Date  . GERD (gastroesophageal reflux disease)   . Asthma   . Anxiety and depression   . Irritable bowel syndrome (IBS)   . Obesity   . H/O gastric ulcer 1985  . History of anorexia nervosa   . History of bulimia   . Anemia   . Gastritis   . Colon polyp   . Fundic gland polyps of stomach, benign   . H. pylori infection 2013  . Arthritis   . Chronic headaches   . History of pneumonia   . COPD (chronic obstructive pulmonary disease) (HCC)     no o2  . RSD (reflex sympathetic dystrophy)   . Hypothyroidism   . Allergy   . Anxiety   . Depression   . Ulcer     gastric, duodenal ulcer  . RSD (reflex sympathetic dystrophy)     Past Surgical History  Procedure Laterality Date  . Thoracoscopy w/ thoracic sympathectomy      for RSD  . Cholecystectomy    . Nasal sinus surgery    . Tonsillectomy    . Knee arthroscopy  04/21/2012    right  . Cervix surgery    . Laparoscopic nissen fundoplication  [id number]    Dr. Johnathan Hausen  . Lumbar spondylosis injected  2013    Dr. Mina Marble  . Colonoscopy    . Upper gastrointestinal endoscopy      There were no vitals filed for this visit.  Visit Diagnosis:  Neck pain  Other  headache syndrome  Scalp pain  Decreased functional activity tolerance  Decreased range of motion of neck  Unsteadiness  Weakness of neck  Weakness of shoulder      Subjective Assessment - 11/05/15 1108    Subjective Just woke up and took her medication prior to coming in for therapy, why she is late. Reports she was in excrutiating pain after last session for several days, not able to do her exercises during that time. Then she did the stretches after her pain decreased and it flared her back up. See's her pain MD tomorrow. She also want to know if it would be okay for her to have Rekie done.                                        Pertinent History asthma, osteoarthritis, RSD, right ankle injury with tears from fall ~1.5 yr ago.    Patient Stated Goals She would like to walk without falling. Get rid of pain in head, be able to sleep.    Currently in Pain? Yes  Pain Score 10-Worst pain ever   Pain Location Generalized   Pain Orientation Upper;Mid;Left   Pain Descriptors / Indicators Aching;Sore;Burning   Pain Type Chronic pain   Pain Radiating Towards down left arm   Pain Onset More than a month ago   Pain Frequency Constant   Aggravating Factors  laying down   Pain Relieving Factors getting up             [facility] Adult PT Treatment/Exercise - 11/05/15 1132    Electrical Stimulation   Electrical Stimulation Location bil cervicals just at hair line   Electrical Stimulation Action concurrent with ultrasound for pain reduction x 10 minutes   Electrical Stimulation Parameters 30 volt   Electrical Stimulation Goals Pain   Ultrasound   Ultrasound Location left upper trap/rhomboid below estim electrode   Ultrasound Parameters 1.5 w/cm2, 100%, 1 mhz x 10 minutes concurrent with estim   Ultrasound Goals Pain   Manual Therapy   Manual Traction gentle cervical distraction with pt seated/back supported in chair   Other Manual Therapy passive stretching to bil upper traps,  scalenes and STM muscles with gentle overpressure through shoulders, 30-40 sec holds each, perfomed on both sides     Seated in chair exercises: all performed within pain free ranges with cues to relax left>right shoulder during exercises. Scapular retraction, 5 sec x 10 reps Posterior shoulder rolls x 10 reps Cervical rotation side<>side and up<>down x 10 each        PT Short Term Goals - 10/29/15 1530    PT SHORT TERM GOAL #1   Title Patient demonstrates understanding of initial HEP. (Target Date: 11/28/2015)   Time 1   Period Months   Status New   PT SHORT TERM GOAL #2   Title Patient reports scalp /head and upper cervical pain improved 25%. (Target Date: 11/28/2015)   Time 1   Period Months   Status New   PT SHORT TERM GOAL #3   Title Cervical AROM >/= 75% of full AROM in all directions. (Target Date: 11/28/2015)   Time 1   Period Months   Status New   PT SHORT TERM GOAL #4   Title Perform Timed Up - Go & set STG.    Time 1   Period Months   Status New   PT SHORT TERM GOAL #5   Title Patient ambulates 350' with single point cane with supervision including negotiating ramps & curbs. (Target Date: 11/28/2015)   Time 1   Period Months   Status New           PT Long Term Goals - 10/29/15 1540    PT LONG TERM GOAL #1   Title Patient verbalizes & demonstrates ongoing HEP for flexibility, strength, balance & endurance. (Target Date: 12/27/2015)   Time 2   Period Months   Status New   PT LONG TERM GOAL #2   Title Patient reports head / scalp and upper cervical pain 50% improvement to enable improved sleep. (Target Date: 12/27/2015)   Time 2   Period Months   Status New   PT LONG TERM GOAL #3   Title Neck Disability Index improved 10% per patient report via FOTO. (Target Date: 12/27/2015)   Time 2   Period Months   Status New   PT LONG TERM GOAL #4   Title Berg Balance > 45/56 to indicate lower fall risk. (Target Date: 12/27/2015)   Time 2   Period Months   Status New  PT  LONG TERM GOAL #5   Title Patient ambulates 500' including grass, ramps, curbs & stairs (1 rail) with single point cane modified independent. (Target Date: 12/27/2015)   Time 2   Period Months   Status New           Plan - 11/05/15 1111    Clinical Impression Statement Skilled session focused on pain reduction with use of modalities, gentle manual therapy and gentle self  range of motion/exercises. Pt with decreased tolerance to estim, needs low settings. Will determine if effective at next session.   Pt will benefit from skilled therapeutic intervention in order to improve on the following deficits Abnormal gait;Decreased activity tolerance;Decreased balance;Decreased coordination;Decreased endurance;Decreased knowledge of use of DME;Decreased mobility;Decreased range of motion;Decreased strength;Dizziness;Impaired flexibility;Pain   Rehab Potential Good   Clinical Impairments Affecting Rehab Potential Chronic pain including RSD   PT Frequency 2x / week   PT Duration --  9 weeks (60 days)   PT Treatment/Interventions ADLs/Self Care Home Management;Biofeedback;Electrical Stimulation;Moist Heat;Traction;Ultrasound;DME Instruction;Gait training;Stair training;Neuromuscular re-education;Functional mobility training;Therapeutic activities;Therapeutic exercise;Balance training;Patient/family education;Manual techniques;Passive range of motion;Energy conservation;Vestibular   PT Next Visit Plan assess if  modalities for pain reduction worked, manual therapy as tolerated, gentle stretching progressing to strengthening, incorporate balance activities as well   Consulted and Agree with Plan of Care Patient        Problem List Patient Active Problem List   Diagnosis Date Noted  . Falls 09/25/2015  . Musculoskeletal neck pain 09/25/2015  . Headache 09/25/2015  . Memory changes 09/25/2015  . Blurry vision 09/25/2015  . Helicobacter pylori (H. pylori) 12/22/2012  . REFLEX SYMPATHETIC DYSTROPHY  02/26/2009  . Personal history of colonic adenoma 02/25/2009  . PERSONAL HISTORY OF FAILED MODERATE SEDATION 02/25/2009  . HYPOTHYROIDISM 01/14/2008  . OBESITY 01/14/2008  . DEPRESSION/ANXIETY 01/14/2008  . ASTHMA 01/14/2008  . GERD 01/14/2008  . IRRITABLE BOWEL SYNDROME 01/14/2008  . ANOREXIA NERVOSA, HX OF 01/14/2008    Willow Ora 11/05/2015, 12:05 PM  Willow Ora, PTA, [place] [address], [place], [place] [address] [phone] 11/05/2015, 12:05 PM   Name: Jo [name] MRN: [id number] Date of Birth: 08-Nov-1960

## 2015-11-06 ENCOUNTER — Ambulatory Visit: Payer: PPO | Admitting: Physical Therapy

## 2015-11-06 DIAGNOSIS — Z79891 Long term (current) use of opiate analgesic: Secondary | ICD-10-CM | POA: Diagnosis not present

## 2015-11-06 DIAGNOSIS — Z5181 Encounter for therapeutic drug level monitoring: Secondary | ICD-10-CM | POA: Diagnosis not present

## 2015-11-06 DIAGNOSIS — M791 Myalgia: Secondary | ICD-10-CM | POA: Diagnosis not present

## 2015-11-06 DIAGNOSIS — R51 Headache: Secondary | ICD-10-CM | POA: Diagnosis not present

## 2015-11-06 DIAGNOSIS — G894 Chronic pain syndrome: Secondary | ICD-10-CM | POA: Diagnosis not present

## 2015-11-06 DIAGNOSIS — M542 Cervicalgia: Secondary | ICD-10-CM | POA: Diagnosis not present

## 2015-11-07 ENCOUNTER — Ambulatory Visit: Payer: PPO | Admitting: Physical Therapy

## 2015-11-12 ENCOUNTER — Ambulatory Visit: Payer: PPO | Admitting: Physical Therapy

## 2015-11-14 ENCOUNTER — Ambulatory Visit: Payer: PPO | Admitting: Physical Therapy

## 2015-11-15 ENCOUNTER — Ambulatory Visit: Payer: PPO | Admitting: Physical Therapy

## 2015-11-15 ENCOUNTER — Encounter: Payer: Self-pay | Admitting: Physical Therapy

## 2015-11-15 DIAGNOSIS — R519 Headache, unspecified: Secondary | ICD-10-CM

## 2015-11-15 DIAGNOSIS — M542 Cervicalgia: Secondary | ICD-10-CM

## 2015-11-15 DIAGNOSIS — R29898 Other symptoms and signs involving the musculoskeletal system: Secondary | ICD-10-CM

## 2015-11-15 DIAGNOSIS — R6889 Other general symptoms and signs: Secondary | ICD-10-CM

## 2015-11-15 DIAGNOSIS — R51 Headache: Secondary | ICD-10-CM

## 2015-11-15 DIAGNOSIS — M25612 Stiffness of left shoulder, not elsewhere classified: Secondary | ICD-10-CM

## 2015-11-15 DIAGNOSIS — G4489 Other headache syndrome: Secondary | ICD-10-CM

## 2015-11-15 DIAGNOSIS — M5382 Other specified dorsopathies, cervical region: Secondary | ICD-10-CM

## 2015-11-15 DIAGNOSIS — R2681 Unsteadiness on feet: Secondary | ICD-10-CM

## 2015-11-15 NOTE — Therapy (Signed)
[facility] [address] [place], [place], [address] Phone: [phone]   Fax:  [phone]  Physical Therapy Treatment  Patient Details  Name: Jo [name] MRN: [id number] Date of Birth: 11-04-1960 Referring Provider: Sarina Ill, MD  Encounter Date: 11/15/2015   11/15/15 1330  PT Visits / Re-Eval  Visit Number 4  Number of Visits 18  Date for PT Re-Evaluation 12/27/15  Authorization  Authorization Type Medicare G-Code & Progress note every 10th visit  PT Time Calculation  PT Start Time 1317  PT Stop Time 1402  PT Time Calculation (min) 45 min  PT - End of Session  Activity Tolerance Patient limited by pain  Behavior During Therapy [facility] for tasks assessed/performed     Past Medical History  Diagnosis Date  . GERD (gastroesophageal reflux disease)   . Asthma   . Anxiety and depression   . Irritable bowel syndrome (IBS)   . Obesity   . H/O gastric ulcer 1985  . History of anorexia nervosa   . History of bulimia   . Anemia   . Gastritis   . Colon polyp   . Fundic gland polyps of stomach, benign   . H. pylori infection 2013  . Arthritis   . Chronic headaches   . History of pneumonia   . COPD (chronic obstructive pulmonary disease) (HCC)     no o2  . RSD (reflex sympathetic dystrophy)   . Hypothyroidism   . Allergy   . Anxiety   . Depression   . Ulcer     gastric, duodenal ulcer  . RSD (reflex sympathetic dystrophy)     Past Surgical History  Procedure Laterality Date  . Thoracoscopy w/ thoracic sympathectomy      for RSD  . Cholecystectomy    . Nasal sinus surgery    . Tonsillectomy    . Knee arthroscopy  04/21/2012    right  . Cervix surgery    . Laparoscopic nissen fundoplication  [id number]    Dr. Johnathan Hausen  . Lumbar spondylosis injected  2013    Dr. Mina Marble  . Colonoscopy    . Upper gastrointestinal endoscopy      There were no vitals filed for this visit.  Visit Diagnosis:   Neck pain   .  Other headache syndrome    Scalp pain     Decreased functional activity tolerance   .  Decreased range of motion of neck     Weakness of neck     Weakness of shoulder   8.  Unsteadiness    Decreased range of motion of shoulder, left        Subjective Assessment - 11/15/15 1326    Subjective Feeling better after injections to neck and scalp by pain management MD. No falls. Also has been put back on fentanly patches and taken off another pain medication. Also planning to start [place]ett today, has 3 session set up for her.   Patient Stated Goals She would like to walk without falling. Get rid of pain in head, be able to sleep.    Currently in Pain? Yes   Pain Score 8    Pain Location Generalized   Pain Orientation Upper;Mid;Left   Pain Descriptors / Indicators Aching;Sore;Burning   Pain Type Chronic pain   Pain Onset More than a month ago   Pain Frequency Constant   Aggravating Factors  using left arm too much, lying down (just aggrivates her head)   Pain Relieving  Factors resting arm, getting back up into seated/reclined positions           [facility] Adult PT Treatment/Exercise - 11/15/15 1331    Electrical Stimulation   Electrical Stimulation Location bil cervicals just at hair line   Electrical Stimulation Action Premod concurrent with ultrasound (2 machines used due to ultrasound on estim unit broken).   Electrical Stimulation Parameters 8.2 volts cv   Electrical Stimulation Goals Pain   Ultrasound   Ultrasound Location bil upper traps below estim electrodes   Ultrasound Parameters 1.5 w/cm2, 100%, 1 mhz x 8 minutes concurrent with esim above   Ultrasound Goals Pain     Exercises Seated edge of mat Cervical motions all directions x 10 Poster shoulder rolls x 10 reps Rolling large blue ball out for shoulder flexion stretch x 10  At wall with red ball: constant cues for shoulder depression and scapular movements/stability Rolling up for flexion  stretch, back down x 10  Scapular circles x 10 each way             PT Short Term Goals - 10/29/15 1530    PT SHORT TERM GOAL #1   Title Patient demonstrates understanding of initial HEP. (Target Date: 11/28/2015)   Time 1   Period Months   Status New   PT SHORT TERM GOAL #2   Title Patient reports scalp /head and upper cervical pain improved 25%. (Target Date: 11/28/2015)   Time 1   Period Months   Status New   PT SHORT TERM GOAL #3   Title Cervical AROM >/= 75% of full AROM in all directions. (Target Date: 11/28/2015)   Time 1   Period Months   Status New   PT SHORT TERM GOAL #4   Title Perform Timed Up - Go & set STG.    Time 1   Period Months   Status New   PT SHORT TERM GOAL #5   Title Patient ambulates 350' with single point cane with supervision including negotiating ramps & curbs. (Target Date: 11/28/2015)   Time 1   Period Months   Status New           PT Long Term Goals - 10/29/15 1540    PT LONG TERM GOAL #1   Title Patient verbalizes & demonstrates ongoing HEP for flexibility, strength, balance & endurance. (Target Date: 12/27/2015)   Time 2   Period Months   Status New   PT LONG TERM GOAL #2   Title Patient reports head / scalp and upper cervical pain 50% improvement to enable improved sleep. (Target Date: 12/27/2015)   Time 2   Period Months   Status New   PT LONG TERM GOAL #3   Title Neck Disability Index improved 10% per patient report via FOTO. (Target Date: 12/27/2015)   Time 2   Period Months   Status New   PT LONG TERM GOAL #4   Title Berg Balance > 45/56 to indicate lower fall risk. (Target Date: 12/27/2015)   Time 2   Period Months   Status New   PT LONG TERM GOAL #5   Title Patient ambulates 500' including grass, ramps, curbs & stairs (1 rail) with single point cane modified independent. (Target Date: 12/27/2015)   Time 2   Period Months   Status New        11/15/15 1331  Plan  Clinical Impression Statement Today's skilled session  focused on gentle strengthening and stretching of pt's cervical and UE muscles.  Ended sesson with modalities to derease pain associated with exercises performed today. Pt continues to report decreased pain with estim/ultrasound combination. Pt is making steady progress toward goals.                                 Pt will benefit from skilled therapeutic intervention in order to improve on the following deficits Abnormal gait;Decreased activity tolerance;Decreased balance;Decreased coordination;Decreased endurance;Decreased knowledge of use of DME;Decreased mobility;Decreased range of motion;Decreased strength;Dizziness;Impaired flexibility;Pain  Rehab Potential Good  Clinical Impairments Affecting Rehab Potential Chronic pain including RSD  PT Frequency 2x / week  PT Duration (9 weeks (60 days))  PT Treatment/Interventions ADLs/Self Care Home Management;Biofeedback;Electrical Stimulation;Moist Heat;Traction;Ultrasound;DME Instruction;Gait training;Stair training;Neuromuscular re-education;Functional mobility training;Therapeutic activities;Therapeutic exercise;Balance training;Patient/family education;Manual techniques;Passive range of motion;Energy conservation;Vestibular  PT Next Visit Plan gentle stretching progressing to strengthening, incorporate balance activities as well; manual therapy as needed (pt with decreased tolerance);modalities as needed for pain relief/control  Consulted and Agree with Plan of Care Patient       Problem List Patient Active Problem List   Diagnosis Date Noted  . Falls 09/25/2015  . Musculoskeletal neck pain 09/25/2015  . Headache 09/25/2015  . Memory changes 09/25/2015  . Blurry vision 09/25/2015  . Helicobacter pylori (H. pylori) 12/22/2012  . REFLEX SYMPATHETIC DYSTROPHY 02/26/2009  . Personal history of colonic adenoma 02/25/2009  . PERSONAL HISTORY OF FAILED MODERATE SEDATION 02/25/2009  . HYPOTHYROIDISM 01/14/2008  . OBESITY 01/14/2008  .  DEPRESSION/ANXIETY 01/14/2008  . ASTHMA 01/14/2008  . GERD 01/14/2008  . IRRITABLE BOWEL SYNDROME 01/14/2008  . ANOREXIA NERVOSA, HX OF 01/14/2008    Willow Ora 11/15/2015, 2:01 PM  Willow Ora, PTA, [facility] [address] [place], [place] [address] [phone] 11/17/2015, 11:14 PM   Name: DIVA MCNEFF MRN: [id number] Date of Birth: 11-12-1960

## 2015-11-20 ENCOUNTER — Ambulatory Visit: Payer: PPO | Attending: Neurology | Admitting: Physical Therapy

## 2015-11-20 ENCOUNTER — Encounter: Payer: Self-pay | Admitting: Physical Therapy

## 2015-11-20 DIAGNOSIS — M7582 Other shoulder lesions, left shoulder: Secondary | ICD-10-CM | POA: Diagnosis not present

## 2015-11-20 DIAGNOSIS — M542 Cervicalgia: Secondary | ICD-10-CM | POA: Diagnosis not present

## 2015-11-20 DIAGNOSIS — R269 Unspecified abnormalities of gait and mobility: Secondary | ICD-10-CM | POA: Insufficient documentation

## 2015-11-20 DIAGNOSIS — R2681 Unsteadiness on feet: Secondary | ICD-10-CM | POA: Diagnosis not present

## 2015-11-20 DIAGNOSIS — R6889 Other general symptoms and signs: Secondary | ICD-10-CM | POA: Insufficient documentation

## 2015-11-20 DIAGNOSIS — G4489 Other headache syndrome: Secondary | ICD-10-CM | POA: Insufficient documentation

## 2015-11-20 DIAGNOSIS — R29818 Other symptoms and signs involving the nervous system: Secondary | ICD-10-CM | POA: Diagnosis not present

## 2015-11-20 DIAGNOSIS — M25612 Stiffness of left shoulder, not elsewhere classified: Secondary | ICD-10-CM

## 2015-11-20 DIAGNOSIS — M5382 Other specified dorsopathies, cervical region: Secondary | ICD-10-CM | POA: Diagnosis not present

## 2015-11-20 DIAGNOSIS — R51 Headache: Secondary | ICD-10-CM | POA: Diagnosis not present

## 2015-11-20 DIAGNOSIS — R29898 Other symptoms and signs involving the musculoskeletal system: Secondary | ICD-10-CM | POA: Insufficient documentation

## 2015-11-20 DIAGNOSIS — R519 Headache, unspecified: Secondary | ICD-10-CM

## 2015-11-21 NOTE — Therapy (Signed)
[facility] [address] [place], [place], [address] Phone: [phone]   Fax:  [phone]  Physical Therapy Treatment  Patient Details  Name: Jo [name] MRN: [id number] Date of Birth: [birth year] Referring Provider: Sarina Ill, MD  Encounter Date: 11/20/2015      PT End of Session - 11/20/15 1237    Visit Number 5   Number of Visits 18   Date for PT Re-Evaluation 12/27/15   Authorization Type Medicare G-Code & Progress note every 10th visit   PT Start Time 1234   PT Stop Time 1315   PT Time Calculation (min) 41 min   Activity Tolerance Patient limited by pain   Behavior During Therapy [facility] for tasks assessed/performed      Past Medical History  Diagnosis Date  . GERD (gastroesophageal reflux disease)   . Asthma   . Anxiety and depression   . Irritable bowel syndrome (IBS)   . Obesity   . H/O gastric ulcer 1985  . History of anorexia nervosa   . History of bulimia   . Anemia   . Gastritis   . Colon polyp   . Fundic gland polyps of stomach, benign   . H. pylori infection 2013  . Arthritis   . Chronic headaches   . History of pneumonia   . COPD (chronic obstructive pulmonary disease) (HCC)     no o2  . RSD (reflex sympathetic dystrophy)   . Hypothyroidism   . Allergy   . Anxiety   . Depression   . Ulcer     gastric, duodenal ulcer  . RSD (reflex sympathetic dystrophy)     Past Surgical History  Procedure Laterality Date  . Thoracoscopy w/ thoracic sympathectomy      for RSD  . Cholecystectomy    . Nasal sinus surgery    . Tonsillectomy    . Knee arthroscopy  04/21/2012    right  . Cervix surgery    . Laparoscopic nissen fundoplication  [id number]    Dr. Johnathan Hausen  . Lumbar spondylosis injected  2013    Dr. Mina Marble  . Colonoscopy    . Upper gastrointestinal endoscopy      There were no vitals filed for this visit.  Visit Diagnosis:  Neck pain  Other headache syndrome  Scalp  pain  Decreased functional activity tolerance  Decreased range of motion of neck  Weakness of neck  Weakness of shoulder  Decreased range of motion of shoulder, left      Subjective Assessment - 11/20/15 1234    Subjective Had a good night last night, was able to sleep. Pain is decreasing. Started fentanyl patches 4-5 days ago, getting some nausea as a result.    Pertinent History asthma, osteoarthritis, RSD, right ankle injury with tears from fall ~1.5 yr ago.    Patient Stated Goals She would like to walk without falling. Get rid of pain in head, be able to sleep.    Currently in Pain? Yes   Pain Score 6    Pain Location Generalized   Pain Orientation Upper;Mid;Left   Pain Descriptors / Indicators Aching;Burning   Pain Type Chronic pain   Pain Radiating Towards down left arm and up into scalp   Pain Onset More than a month ago   Pain Frequency Constant   Aggravating Factors  using left arm, certain neck movements   Pain Relieving Factors resting arm, pain medication  [facility] Adult PT Treatment/Exercise - 11/20/15 1238    Electrical Stimulation   Electrical Stimulation Location bil cervicals just at hair Biomedical scientist Action premod with 1st channel only   Electrical Stimulation Parameters 8.4 volts cv   Electrical Stimulation Goals Pain   Ultrasound   Ultrasound Location left upper/middle trap between scapular border and spine   Ultrasound Parameters 1.5 w/cm2, 1 mhz, 100% x 8 minutes concurrent with premod estim   Ultrasound Goals Pain   Manual Therapy   Manual therapy comments palpable trigger points along left side, bil muscular tightness as well. decreased muscular tightness noted with manual therapy techniques, however pt with sensitivity to manual therapy on left bewtween scapula and spine, tolerating very light touch only.  Due to this not much difference noted in tightness or trigger points with manual therapy. Pt does report having  increased tolerance to touch when lotions/gels used (vs none as with today). will try manual therapy with massage cream at next session to determine effectiveness of treatment for muscular tightness/tigger point relief.                                     Myofascial Release to upper trap, rhomboid and cervical muscles on left>right   Manual Traction gentle cervical distraction with pt seated/back supported in chair   Other Manual Therapy passive stretching to bil upper traps, scalenes and STM muscles with gentle overpressure through shoulders, 30-40 sec holds each, perfomed on both sides. strain counter strain used between [employer] and spine with very light pressure concurrent with exhale for increased relaxation/decreased guarding with manual therapy. Pt reported less pain this way and increased tolerance.                                      Exercises: Seated in chair with back support Posterior shoulder rolls Cervical motions laterally, up<>down and rotationally both ways, 5 sec holds for stretching (within pain free ranges) x 10 each way         PT Short Term Goals - 10/29/15 1530    PT SHORT TERM GOAL #1   Title Patient demonstrates understanding of initial HEP. (Target Date: 11/28/2015)   Time 1   Period Months   Status New   PT SHORT TERM GOAL #2   Title Patient reports scalp /head and upper cervical pain improved 25%. (Target Date: 11/28/2015)   Time 1   Period Months   Status New   PT SHORT TERM GOAL #3   Title Cervical AROM >/= 75% of full AROM in all directions. (Target Date: 11/28/2015)   Time 1   Period Months   Status New   PT SHORT TERM GOAL #4   Title Perform Timed Up - Go & set STG.    Time 1   Period Months   Status New   PT SHORT TERM GOAL #5   Title Patient ambulates 350' with single point cane with supervision including negotiating ramps & curbs. (Target Date: 11/28/2015)   Time 1   Period Months   Status New           PT Long Term Goals - 10/29/15 1540     PT LONG TERM GOAL #1   Title Patient verbalizes & demonstrates ongoing HEP for flexibility, strength, balance & endurance. (Target Date: 12/27/2015)  Time 2   Period Months   Status New   PT LONG TERM GOAL #2   Title Patient reports head / scalp and upper cervical pain 50% improvement to enable improved sleep. (Target Date: 12/27/2015)   Time 2   Period Months   Status New   PT LONG TERM GOAL #3   Title Neck Disability Index improved 10% per patient report via FOTO. (Target Date: 12/27/2015)   Time 2   Period Months   Status New   PT LONG TERM GOAL #4   Title Berg Balance > 45/56 to indicate lower fall risk. (Target Date: 12/27/2015)   Time 2   Period Months   Status New   PT LONG TERM GOAL #5   Title Patient ambulates 500' including grass, ramps, curbs & stairs (1 rail) with single point cane modified independent. (Target Date: 12/27/2015)   Time 2   Period Months   Status New               Plan - 11/20/15 1238    Clinical Impression Statement Today's skilled session continued to focus on pain reduction and increased cervical range of motion/decreased tighness. Pt continues to have limited tolerance to manual therapy to left side, will try with massage cream/gel next session to decrease skin friciton form RSD for increased tolerance. Pt did report decreased pain after estim/ultrasound combination. Pt is making progress toward goals.                          Pt will benefit from skilled therapeutic intervention in order to improve on the following deficits Abnormal gait;Decreased activity tolerance;Decreased balance;Decreased coordination;Decreased endurance;Decreased knowledge of use of DME;Decreased mobility;Decreased range of motion;Decreased strength;Dizziness;Impaired flexibility;Pain   Rehab Potential Good   Clinical Impairments Affecting Rehab Potential Chronic pain including RSD   PT Frequency 2x / week   PT Duration --  9 weeks (60 days)   PT Treatment/Interventions  ADLs/Self Care Home Management;Biofeedback;Electrical Stimulation;Moist Heat;Traction;Ultrasound;DME Instruction;Gait training;Stair training;Neuromuscular re-education;Functional mobility training;Therapeutic activities;Therapeutic exercise;Balance training;Patient/family education;Manual techniques;Passive range of motion;Energy conservation;Vestibular   PT Next Visit Plan gentle stretching progressing to strengthening, incorporate balance activities as well; manual therapy as needed (pt with decreased tolerance, try with cream/gel for decreased skin friction due to RSD);modalities as needed for pain relief/control   Consulted and Agree with Plan of Care Patient        Problem List Patient Active Problem List   Diagnosis Date Noted  . Falls 09/25/2015  . Musculoskeletal neck pain 09/25/2015  . Headache 09/25/2015  . Memory changes 09/25/2015  . Blurry vision 09/25/2015  . Helicobacter pylori (H. pylori) 12/22/2012  . REFLEX SYMPATHETIC DYSTROPHY 02/26/2009  . Personal history of colonic adenoma 02/25/2009  . PERSONAL HISTORY OF FAILED MODERATE SEDATION 02/25/2009  . HYPOTHYROIDISM 01/14/2008  . OBESITY 01/14/2008  . DEPRESSION/ANXIETY 01/14/2008  . ASTHMA 01/14/2008  . GERD 01/14/2008  . IRRITABLE BOWEL SYNDROME 01/14/2008  . ANOREXIA NERVOSA, HX OF 01/14/2008    Willow Ora 11/21/2015, 9:47 AM  Willow Ora, PTA, [facility] [address], [place], [place] [address] [phone] 11/21/2015, 9:47 AM  Name: SARAHLYNN GOSA MRN: [id number] Date of Birth: 05-Oct-1960

## 2015-11-22 ENCOUNTER — Ambulatory Visit: Payer: PPO | Admitting: Physical Therapy

## 2015-11-27 ENCOUNTER — Encounter: Payer: Self-pay | Admitting: Physical Therapy

## 2015-11-27 ENCOUNTER — Ambulatory Visit: Payer: PPO | Admitting: Physical Therapy

## 2015-11-27 DIAGNOSIS — R2681 Unsteadiness on feet: Secondary | ICD-10-CM

## 2015-11-27 DIAGNOSIS — R2689 Other abnormalities of gait and mobility: Secondary | ICD-10-CM

## 2015-11-27 DIAGNOSIS — M542 Cervicalgia: Secondary | ICD-10-CM | POA: Diagnosis not present

## 2015-11-27 DIAGNOSIS — R29898 Other symptoms and signs involving the musculoskeletal system: Secondary | ICD-10-CM

## 2015-11-27 NOTE — Therapy (Signed)
[facility] [address] [place], [place], [address] Phone: [phone]   Fax:  [phone]  Physical Therapy Treatment  Patient Details  Name: Jo [name] MRN: [id number] Date of Birth: Jul 20, 1961 Referring Provider: Sarina Ill, MD  Encounter Date: 11/27/2015      PT End of Session - 11/27/15 1556    Visit Number 6   Number of Visits 18   Date for PT Re-Evaluation 12/27/15   Authorization Type Medicare G-Code & Progress note every 10th visit   PT Start Time 1403   PT Stop Time 1446   PT Time Calculation (min) 43 min   Activity Tolerance Patient limited by pain   Behavior During Therapy [facility] for tasks assessed/performed      Past Medical History  Diagnosis Date  . GERD (gastroesophageal reflux disease)   . Asthma   . Anxiety and depression   . Irritable bowel syndrome (IBS)   . Obesity   . H/O gastric ulcer 1985  . History of anorexia nervosa   . History of bulimia   . Anemia   . Gastritis   . Colon polyp   . Fundic gland polyps of stomach, benign   . H. pylori infection 2013  . Arthritis   . Chronic headaches   . History of pneumonia   . COPD (chronic obstructive pulmonary disease) (HCC)     no o2  . RSD (reflex sympathetic dystrophy)   . Hypothyroidism   . Allergy   . Anxiety   . Depression   . Ulcer     gastric, duodenal ulcer  . RSD (reflex sympathetic dystrophy)     Past Surgical History  Procedure Laterality Date  . Thoracoscopy w/ thoracic sympathectomy      for RSD  . Cholecystectomy    . Nasal sinus surgery    . Tonsillectomy    . Knee arthroscopy  04/21/2012    right  . Cervix surgery    . Laparoscopic nissen fundoplication  [id number]    Dr. Johnathan Hausen  . Lumbar spondylosis injected  2013    Dr. Mina Marble  . Colonoscopy    . Upper gastrointestinal endoscopy      There were no vitals filed for this visit.  Visit Diagnosis:  Neck pain  Unsteadiness  Balance  problems  Decreased range of motion of neck      Subjective Assessment - 11/27/15 1405    Subjective Patient reports no new falls. Patient reports no new pain. Patient stated that the traction last session seemed to cause spasming in neck and shoulders. Patient states that she believes the modalities have relieved the neck pain some.   Pertinent History asthma, osteoarthritis, RSD, right ankle injury with tears from fall ~1.5 yr ago.    Patient Stated Goals She would like to walk without falling. Get rid of pain in head, be able to sleep.    Currently in Pain? Yes   Pain Score 5    Pain Location Neck   Pain Orientation Upper   Pain Descriptors / Indicators Aching   Pain Onset More than a month ago   Pain Score 7   Pain Location Arm   Pain Orientation Left   Pain Type Other (Comment)  Pt states she feels like bugs are crawling in her skin.          [facility] Adult PT Treatment/Exercise - 11/27/15 1546    Neuro Re-ed    Neuro Re-ed Details  Corner exercises  narrow BOS: no head movement, head nod, head shake; eyes closed no head movement  Patient struggled significantly more with eyes closed.   Acupuncturist Location bil cervicals just at hair Event organiser Premod 1st channel only   Electrical Stimulation Parameters to tolerance  12.5V   Electrical Stimulation Goals Pain   Ultrasound   Ultrasound Location left upper trap/rhomboid below estim electrode   Ultrasound Parameters 1.5 w/cm2, 100%, 1MHz x 10 min concurrent with estim.   Ultrasound Goals Pain   Manual Therapy   Manual Therapy Myofascial release;Soft tissue mobilization   Manual therapy comments palpable trigger points along left side, bil muscular tightness as well. decreased muscular tightness noted with manual therapy techniques, however pt with sensitivity to manual therapy on left bewtween scapula and spine, tolerating very light touch only.  Due to this not much  difference noted in tightness or trigger points with manual therapy. Pt does report having increased tolerance to touch when lotions/gels used as today. Noted more effective for muscular tightness and trigger point relief than previous session with no lotions or gels used.                                     Soft tissue mobilization gentle mobilization to upper trap and cervical muscles on L.   Myofascial Release to upper trap, rhomboid and cervical muscles on left>right          PT Education - 11/27/15 1552    Education provided Yes   Education Details Discussed balance training; addition of corner exercises to HEP.   Person(s) Educated Patient   Methods Explanation;Demonstration;Verbal cues   Comprehension Verbalized understanding;Returned demonstration;Verbal cues required;Need further instruction          PT Short Term Goals - 10/29/15 1530    PT SHORT TERM GOAL #1   Title Patient demonstrates understanding of initial HEP. (Target Date: 11/28/2015)   Time 1   Period Months   Status New   PT SHORT TERM GOAL #2   Title Patient reports scalp /head and upper cervical pain improved 25%. (Target Date: 11/28/2015)   Time 1   Period Months   Status New   PT SHORT TERM GOAL #3   Title Cervical AROM >/= 75% of full AROM in all directions. (Target Date: 11/28/2015)   Time 1   Period Months   Status New   PT SHORT TERM GOAL #4   Title Perform Timed Up - Go & set STG.    Time 1   Period Months   Status New   PT SHORT TERM GOAL #5   Title Patient ambulates 350' with single point cane with supervision including negotiating ramps & curbs. (Target Date: 11/28/2015)   Time 1   Period Months   Status New           PT Long Term Goals - 10/29/15 1540    PT LONG TERM GOAL #1   Title Patient verbalizes & demonstrates ongoing HEP for flexibility, strength, balance & endurance. (Target Date: 12/27/2015)   Time 2   Period Months   Status New   PT LONG TERM GOAL #2   Title Patient reports  head / scalp and upper cervical pain 50% improvement to enable improved sleep. (Target Date: 12/27/2015)   Time 2   Period Months   Status New   PT LONG TERM  GOAL #3   Title Neck Disability Index improved 10% per patient report via FOTO. (Target Date: 12/27/2015)   Time 2   Period Months   Status New   PT LONG TERM GOAL #4   Title Berg Balance > 45/56 to indicate lower fall risk. (Target Date: 12/27/2015)   Time 2   Period Months   Status New   PT LONG TERM GOAL #5   Title Patient ambulates 500' including grass, ramps, curbs & stairs (1 rail) with single point cane modified independent. (Target Date: 12/27/2015)   Time 2   Period Months   Status New          Plan - 11/27/15 1557    Clinical Impression Statement Skilled session addressing pain control and mobilization of cervical and upper traps, as well as deficits in balance with addition of corner exercises to HEP. Patient has noted decreased pain after sessions. Patient is making steady progress toward goals.   Pt will benefit from skilled therapeutic intervention in order to improve on the following deficits Abnormal gait;Decreased activity tolerance;Decreased balance;Decreased coordination;Decreased endurance;Decreased knowledge of use of DME;Decreased mobility;Decreased range of motion;Decreased strength;Dizziness;Impaired flexibility;Pain   Rehab Potential Good   Clinical Impairments Affecting Rehab Potential Chronic pain including RSD   PT Frequency 2x / week   PT Duration --  9 weeks (60 days)   PT Treatment/Interventions ADLs/Self Care Home Management;Biofeedback;Electrical Stimulation;Moist Heat;Traction;Ultrasound;DME Instruction;Gait training;Stair training;Neuromuscular re-education;Functional mobility training;Therapeutic activities;Therapeutic exercise;Balance training;Patient/family education;Manual techniques;Passive range of motion;Energy conservation;Vestibular   PT Next Visit Plan Follow up with patient about addition of  corner exercises to HEP. Assess STGs at next session.  Continue with pain control and soft tissue mobilization/manual therapy to upper/mid trap and cervical region.  Progress stretching and add strengthening as appropriate.   PT Home Exercise Plan Added eyes closed narrow feet corner exercises to HEP. Consider addition of head movement with eyes closed as appropriate.   Consulted and Agree with Plan of Care Patient        Problem List Patient Active Problem List   Diagnosis Date Noted  . Falls 09/25/2015  . Musculoskeletal neck pain 09/25/2015  . Headache 09/25/2015  . Memory changes 09/25/2015  . Blurry vision 09/25/2015  . Helicobacter pylori (H. pylori) 12/22/2012  . REFLEX SYMPATHETIC DYSTROPHY 02/26/2009  . Personal history of colonic adenoma 02/25/2009  . PERSONAL HISTORY OF FAILED MODERATE SEDATION 02/25/2009  . HYPOTHYROIDISM 01/14/2008  . OBESITY 01/14/2008  . DEPRESSION/ANXIETY 01/14/2008  . ASTHMA 01/14/2008  . GERD 01/14/2008  . IRRITABLE BOWEL SYNDROME 01/14/2008  . ANOREXIA NERVOSA, HX OF 01/14/2008    Bayard Beaver, SPTA 11/27/2015, 4:03 PM  [facility] [address] [place], [place], [address] Phone: [phone]   Fax:  [phone]  Name: Jo [name] MRN: [id number] Date of Birth: [birth year]  This note has been reviewed and edited by supervising CI.  Willow Ora, PTA, [facility] [address] [place], [place] [address] [phone] 11/29/2015, 11:10 AM

## 2015-11-27 NOTE — Patient Instructions (Signed)
Feet Together, Varied Arm Positions - Eyes Closed    Stand with feet together and arms out in front by chair to use as needed. Close eyes and visualize upright position. Hold __30__ seconds. Repeat _2___ times per session. Do __1-2__ sessions per day.  Copyright  VHI. All rights reserved.

## 2015-11-29 ENCOUNTER — Ambulatory Visit: Payer: PPO | Admitting: Physical Therapy

## 2015-12-04 ENCOUNTER — Ambulatory Visit: Payer: PPO | Admitting: Physical Therapy

## 2015-12-04 ENCOUNTER — Encounter: Payer: Self-pay | Admitting: Physical Therapy

## 2015-12-04 DIAGNOSIS — R6889 Other general symptoms and signs: Secondary | ICD-10-CM

## 2015-12-04 DIAGNOSIS — R2681 Unsteadiness on feet: Secondary | ICD-10-CM

## 2015-12-04 DIAGNOSIS — M542 Cervicalgia: Secondary | ICD-10-CM

## 2015-12-04 DIAGNOSIS — R269 Unspecified abnormalities of gait and mobility: Secondary | ICD-10-CM

## 2015-12-04 NOTE — Therapy (Addendum)
[facility] [address] [place], [place], [address] Phone: [phone]   Fax:  [phone]  Physical Therapy Treatment  Patient Details  Name: Jo [name] MRN: [id number] Date of Birth: 1960-10-12 Referring Provider: Sarina Ill, MD  Encounter Date: 12/04/2015      PT End of Session - 12/04/15 1705    Visit Number 7   Number of Visits 18   Date for PT Re-Evaluation 12/27/15   Authorization Type Medicare G-Code & Progress note every 10th visit   PT Start Time 1400   PT Stop Time 1455   PT Time Calculation (min) 55 min   Equipment Utilized During Treatment Gait belt   Activity Tolerance Patient limited by pain   Behavior During Therapy [facility] for tasks assessed/performed      Past Medical History  Diagnosis Date  . GERD (gastroesophageal reflux disease)   . Asthma   . Anxiety and depression   . Irritable bowel syndrome (IBS)   . Obesity   . H/O gastric ulcer 1985  . History of anorexia nervosa   . History of bulimia   . Anemia   . Gastritis   . Colon polyp   . Fundic gland polyps of stomach, benign   . H. pylori infection 2013  . Arthritis   . Chronic headaches   . History of pneumonia   . COPD (chronic obstructive pulmonary disease) (HCC)     no o2  . RSD (reflex sympathetic dystrophy)   . Hypothyroidism   . Allergy   . Anxiety   . Depression   . Ulcer     gastric, duodenal ulcer  . RSD (reflex sympathetic dystrophy)     Past Surgical History  Procedure Laterality Date  . Thoracoscopy w/ thoracic sympathectomy      for RSD  . Cholecystectomy    . Nasal sinus surgery    . Tonsillectomy    . Knee arthroscopy  04/21/2012    right  . Cervix surgery    . Laparoscopic nissen fundoplication  74/08/8785    Dr. Johnathan Hausen  . Lumbar spondylosis injected  2013    Dr. Mina Marble  . Colonoscopy    . Upper gastrointestinal endoscopy      There were no vitals filed for this visit.  Visit  Diagnosis:  Neck pain  Unsteadiness  Decreased functional activity tolerance  Abnormality of gait      Subjective Assessment - 12/04/15 1408    Subjective Patient reports no new falls. Patient reports no new pain. Patient states that she believes the modalities have relieved the neck pain some. Patient states she has been sick recently.   Pertinent History asthma, osteoarthritis, RSD, right ankle injury with tears from fall ~1.5 yr ago.    Patient Stated Goals She would like to walk without falling. Get rid of pain in head, be able to sleep.    Currently in Pain? Yes   Pain Score 5    Pain Location Neck   Pain Orientation Mid   Pain Descriptors / Indicators Aching   Pain Onset More than a month ago   Pain Score 10   Pain Location Arm   Pain Type Other (Comment)   Pain Radiating Towards Patient states it feels like bugs crawling in her skin.   Pain Onset More than a month ago           [facility] Adult PT Treatment/Exercise - 12/04/15 1702    Ambulation/Gait   Ambulation/Gait  Yes   Ambulation/Gait Assistance 5: Supervision   Ambulation/Gait Assistance Details Verbal cues for proper weight shift through RLE   Ambulation Distance (Feet) 420 Feet   Assistive device Straight cane   Gait Pattern Step-through pattern;Decreased arm swing - left;Decreased stride length;Left hip hike;Antalgic;Lateral hip instability;Wide base of support   Ambulation Surface Level;Indoor   Stairs Yes   Stairs Assistance 5: Supervision   Stairs Assistance Details (indicate cue type and reason) Verbal cues for sequencing and safety awareness.   Stair Management Technique One rail Right;One rail Left;Step to pattern;Forwards;With cane   Number of Stairs 4   Ramp 5: Supervision   Ramp Details (indicate cue type and reason) Verbal cues for safety awareness   Curb 5: Supervision   Curb Details (indicate cue type and reason) Verbal cues initially for sequencing.   Balance   Balance Assessed Yes    Standardized Balance Assessment   Standardized Balance Assessment Timed Up and Go Test   Timed Up and Go Test   TUG Normal TUG   Normal TUG (seconds) 8.79  With cane   Modalities   Modalities Moist Heat   Moist Heat Therapy   Number Minutes Moist Heat 10 Minutes  with TENS   Moist Heat Location Cervical   Electrical Stimulation   Electrical Stimulation Location bil cervicals just at hair line  with moist heat   Electrical Stimulation Action TENS   Electrical Stimulation Parameters to tolerance 12.5V   Electrical Stimulation Goals Pain         PT Short Term Goals - 12/04/15 1707    PT SHORT TERM GOAL #1   Title Patient demonstrates understanding of initial HEP. (Target Date: 11/28/2015)   Baseline Achieved 12/04/15   Time --   Period --   Status Achieved   PT SHORT TERM GOAL #2   Title Patient reports scalp /head and upper cervical pain improved 25%. (Target Date: 11/28/2015)   Baseline Achieved 12/04/15   Time --   Period --   Status Achieved   PT SHORT TERM GOAL #3   Title Cervical AROM >/= 75% of full AROM in all directions. (Target Date: 11/28/2015)   Baseline Patially met 12/04/15 patient has functional ROM in flexion, extension, and bilateral rotation but is limited at 50% by pain in lateral flexion bilaterally.   Time --   Period --   Status Partially Met   PT SHORT TERM GOAL #4   Title Perform Timed Up - Go & set STG.    Baseline Patient achieved normal TUG at 8.97 seconds.   Time --   Period --   Status Achieved   PT SHORT TERM GOAL #5   Title Patient ambulates 350' with single point cane with supervision including negotiating ramps & curbs. (Target Date: 11/28/2015)   Baseline Achieved 12/04/15 with patient ambulating 420' with single point cane with supervision including negotiating ramps, curbs, and stairs.   Time --   Period --   Status Achieved            PT Long Term Goals - 10/29/15 1540    PT LONG TERM GOAL #1   Title Patient verbalizes & demonstrates  ongoing HEP for flexibility, strength, balance & endurance. (Target Date: 12/27/2015)   Time 2   Period Months   Status New   PT LONG TERM GOAL #2   Title Patient reports head / scalp and upper cervical pain 50% improvement to enable improved sleep. (Target Date: 12/27/2015)   Time 2  Period Months   Status New   PT LONG TERM GOAL #3   Title Neck Disability Index improved 10% per patient report via FOTO. (Target Date: 12/27/2015)   Time 2   Period Months   Status New   PT LONG TERM GOAL #4   Title Berg Balance > 45/56 to indicate lower fall risk. (Target Date: 12/27/2015)   Time 2   Period Months   Status New   PT LONG TERM GOAL #5   Title Patient ambulates 500' including grass, ramps, curbs & stairs (1 rail) with single point cane modified independent. (Target Date: 12/27/2015)   Time 2   Period Months   Status New          Plan - 12/04/15 1706    Clinical Impression Statement Skilled session addressing gait with barriers, pain control, and goals assessment. Patient achieved several STGs today and is making steady progress toward other goals.    Pt will benefit from skilled therapeutic intervention in order to improve on the following deficits Abnormal gait;Decreased activity tolerance;Decreased balance;Decreased coordination;Decreased endurance;Decreased knowledge of use of DME;Decreased mobility;Decreased range of motion;Decreased strength;Dizziness;Impaired flexibility;Pain   Rehab Potential Good   Clinical Impairments Affecting Rehab Potential Chronic pain including RSD   PT Frequency 2x / week   PT Duration --  9 weeks (60 days)   PT Treatment/Interventions ADLs/Self Care Home Management;Biofeedback;Electrical Stimulation;Moist Heat;Traction;Ultrasound;DME Instruction;Gait training;Stair training;Neuromuscular re-education;Functional mobility training;Therapeutic activities;Therapeutic exercise;Balance training;Patient/family education;Manual techniques;Passive range of  motion;Energy conservation;Vestibular   PT Next Visit Plan Follow up with patient about addition of corner exercises to HEP. Continue with pain control and soft tissue mobilization/manual therapy to upper/mid trap and cervical region. Progress stretching and add strengthening as appropriate.   PT Home Exercise Plan Consider addition of head movement with eyes closed to corner exercises as appropriate.   Consulted and Agree with Plan of Care Patient        Problem List Patient Active Problem List   Diagnosis Date Noted  . Falls 09/25/2015  . Musculoskeletal neck pain 09/25/2015  . Headache 09/25/2015  . Memory changes 09/25/2015  . Blurry vision 09/25/2015  . Helicobacter pylori (H. pylori) 12/22/2012  . REFLEX SYMPATHETIC DYSTROPHY 02/26/2009  . Personal history of colonic adenoma 02/25/2009  . PERSONAL HISTORY OF FAILED MODERATE SEDATION 02/25/2009  . HYPOTHYROIDISM 01/14/2008  . OBESITY 01/14/2008  . DEPRESSION/ANXIETY 01/14/2008  . ASTHMA 01/14/2008  . GERD 01/14/2008  . IRRITABLE BOWEL SYNDROME 01/14/2008  . ANOREXIA NERVOSA, HX OF 01/14/2008    Bayard Beaver, SPTA 12/05/2015, 3:31 PM  [facility] [address] [place], [place], [address] Phone: [phone]   Fax:  [phone]  Name: MALYN AYTES MRN: [id number] Date of Birth: 1961/09/15  This note has been reviewed and edited by supervising CI.  Willow Ora, PTA, [facility] [address], [place], [place] [address] [phone] 12/05/2015, 3:31 PM

## 2015-12-06 ENCOUNTER — Encounter: Payer: Self-pay | Admitting: Physical Therapy

## 2015-12-06 ENCOUNTER — Ambulatory Visit: Payer: PPO | Admitting: Physical Therapy

## 2015-12-06 DIAGNOSIS — M542 Cervicalgia: Secondary | ICD-10-CM

## 2015-12-06 DIAGNOSIS — R29898 Other symptoms and signs involving the musculoskeletal system: Secondary | ICD-10-CM

## 2015-12-06 DIAGNOSIS — R2681 Unsteadiness on feet: Secondary | ICD-10-CM

## 2015-12-06 DIAGNOSIS — R2689 Other abnormalities of gait and mobility: Secondary | ICD-10-CM

## 2015-12-06 NOTE — Patient Instructions (Signed)
Strengthening: Flexion - Isometric (in Neutral)    Using light pressure from fingertips at forehead, resist bending head forward. Hold __5__ seconds. Repeat _10___ times per set. Do __1__ sets per session. Do __1-2__ sessions per day.  http://orth.exer.us/307   Copyright  VHI. All rights reserved.  Axial Extension (Chin Tuck)    Pull chin in and lengthen back of neck. Hold __5__ seconds while counting out loud. Repeat __10__ times. Do __1-2__ sessions per day.  http://gt2.exer.us/450   Copyright  VHI. All rights reserved.

## 2015-12-06 NOTE — Therapy (Signed)
[facility] [address] [place], [place], [address] Phone: [phone]   Fax:  [phone]  Physical Therapy Treatment  Patient Details  Name: Jo [name] MRN: [id number] Date of Birth: May 13, 1961 Referring Provider: Sarina Ill, MD  Encounter Date: 12/06/2015      PT End of Session - 12/06/15 1238    Visit Number 9   Number of Visits 18   Date for PT Re-Evaluation 12/27/15   Authorization Type Medicare G-Code & Progress note every 10th visit   PT Start Time 1108   PT Stop Time 1150   PT Time Calculation (min) 42 min   Equipment Utilized During Treatment Gait belt   Activity Tolerance Patient limited by pain   Behavior During Therapy [facility] for tasks assessed/performed      Past Medical History  Diagnosis Date  . GERD (gastroesophageal reflux disease)   . Asthma   . Anxiety and depression   . Irritable bowel syndrome (IBS)   . Obesity   . H/O gastric ulcer 1985  . History of anorexia nervosa   . History of bulimia   . Anemia   . Gastritis   . Colon polyp   . Fundic gland polyps of stomach, benign   . H. pylori infection 2013  . Arthritis   . Chronic headaches   . History of pneumonia   . COPD (chronic obstructive pulmonary disease) (HCC)     no o2  . RSD (reflex sympathetic dystrophy)   . Hypothyroidism   . Allergy   . Anxiety   . Depression   . Ulcer     gastric, duodenal ulcer  . RSD (reflex sympathetic dystrophy)     Past Surgical History  Procedure Laterality Date  . Thoracoscopy w/ thoracic sympathectomy      for RSD  . Cholecystectomy    . Nasal sinus surgery    . Tonsillectomy    . Knee arthroscopy  04/21/2012    right  . Cervix surgery    . Laparoscopic nissen fundoplication  65/46/5035    Dr. Johnathan Hausen  . Lumbar spondylosis injected  2013    Dr. Mina Marble  . Colonoscopy    . Upper gastrointestinal endoscopy      There were no vitals filed for this visit.  Visit  Diagnosis:  Neck pain  Unsteadiness  Balance problems  Decreased range of motion of neck      Subjective Assessment - 12/06/15 1110    Subjective Patient reports no new falls. Patient reports no new pain. Patient states that she believes the modalities have relieved the neck pain some. Patient states she is still waiting on her increase in dosage for Fentanyl.   Pertinent History asthma, osteoarthritis, RSD, right ankle injury with tears from fall ~1.5 yr ago.    Patient Stated Goals She would like to walk without falling. Get rid of pain in head, be able to sleep.    Currently in Pain? Yes   Pain Score 4    Pain Location Neck   Pain Orientation Mid   Pain Descriptors / Indicators Aching   Pain Onset More than a month ago   Pain Score 10   Pain Location Arm   Pain Orientation Left   Pain Descriptors / Indicators Burning;Aching   Pain Type Other (Comment)  Patient states it feels like bugs are crawling underneath her skin.   Pain Onset More than a month ago  [facility] Adult PT Treatment/Exercise - 12/06/15 1229    Neuro Re-ed    Neuro Re-ed Details  Corner exercises narrow BOS: eyes closed no head movement, eyes closed head nod, eyes closed head shake   Exercises   Exercises Neck   Neck Exercises: Seated   Cervical Rotation 10 reps;Both   Lateral Flexion 10 reps;Both   Other Seated Exercise Chin tucks, head presses into hand  10 reps each with 10 second holds   Electrical Stimulation   Electrical Stimulation Location bil cervicals just at hair line   Electrical Stimulation Action Premod one channel only   Electrical Stimulation Parameters to tolerance 13V   Electrical Stimulation Goals Pain   Ultrasound   Ultrasound Location Left UT/Rhomboid    Ultrasound Parameters 1.5 w/cm2 100%, 1MHz x 10 minutes concurrent with estim   Ultrasound Goals Pain   Manual Therapy   Manual Therapy Myofascial release;Soft tissue mobilization     Manual therapy to bil upper traps,  STM, rhomboids and cervical paraspinals ( left > right) for decreased pain and muscular tightness.      PT Education - 12/06/15 1236    Education provided Yes   Education Details Added 2 exercises to HEP: chin tucks and head presses.    Person(s) Educated Patient   Methods Explanation;Demonstration;Verbal cues;Handout   Comprehension Verbalized understanding;Verbal cues required          PT Short Term Goals - 12/04/15 1707    PT SHORT TERM GOAL #1   Title Patient demonstrates understanding of initial HEP. (Target Date: 11/28/2015)   Baseline Achieved 12/04/15   Time --   Period --   Status Achieved   PT SHORT TERM GOAL #2   Title Patient reports scalp /head and upper cervical pain improved 25%. (Target Date: 11/28/2015)   Baseline Achieved 12/04/15   Time --   Period --   Status Achieved   PT SHORT TERM GOAL #3   Title Cervical AROM >/= 75% of full AROM in all directions. (Target Date: 11/28/2015)   Baseline Patially met 12/04/15 patient has functional ROM in flexion, extension, and bilateral rotation but is limited at 50% by pain in lateral flexion bilaterally.   Time --   Period --   Status Partially Met   PT SHORT TERM GOAL #4   Title Perform Timed Up - Go & set STG.    Baseline Patient achieved normal TUG at 8.97 seconds.   Time --   Period --   Status Achieved   PT SHORT TERM GOAL #5   Title Patient ambulates 350' with single point cane with supervision including negotiating ramps & curbs. (Target Date: 11/28/2015)   Baseline Achieved 12/04/15 with patient ambulating 420' with single point cane with supervision including negotiating ramps, curbs, and stairs.   Time --   Period --   Status Achieved           PT Long Term Goals - 10/29/15 1540    PT LONG TERM GOAL #1   Title Patient verbalizes & demonstrates ongoing HEP for flexibility, strength, balance & endurance. (Target Date: 12/27/2015)   Time 2   Period Months   Status New   PT LONG TERM GOAL #2   Title Patient  reports head / scalp and upper cervical pain 50% improvement to enable improved sleep. (Target Date: 12/27/2015)   Time 2   Period Months   Status New   PT LONG TERM GOAL #3   Title Neck Disability Index improved 10%  per patient report via [place]. (Target Date: 12/27/2015)   Time 2   Period Months   Status New   PT LONG TERM GOAL #4   Title Berg Balance > 45/56 to indicate lower fall risk. (Target Date: 12/27/2015)   Time 2   Period Months   Status New   PT LONG TERM GOAL #5   Title Patient ambulates 500' including grass, ramps, curbs & stairs (1 rail) with single point cane modified independent. (Target Date: 12/27/2015)   Time 2   Period Months   Status New          Plan - 12/06/15 1240    Clinical Impression Statement Skilled session addressing pain control and deficits in balance. Patient verbalized decreased pain with treatement and increased comfort challenging balance. Patient is making steady progress toward goals.   Pt will benefit from skilled therapeutic intervention in order to improve on the following deficits Abnormal gait;Decreased activity tolerance;Decreased balance;Decreased coordination;Decreased endurance;Decreased knowledge of use of DME;Decreased mobility;Decreased range of motion;Decreased strength;Dizziness;Impaired flexibility;Pain   Rehab Potential Good   Clinical Impairments Affecting Rehab Potential Chronic pain including RSD   PT Frequency 2x / week   PT Duration --  9 weeks (60 days)   PT Treatment/Interventions ADLs/Self Care Home Management;Biofeedback;Electrical Stimulation;Moist Heat;Traction;Ultrasound;DME Instruction;Gait training;Stair training;Neuromuscular re-education;Functional mobility training;Therapeutic activities;Therapeutic exercise;Balance training;Patient/family education;Manual techniques;Passive range of motion;Energy conservation;Vestibular   PT Next Visit Plan G-CODE next session. follow up with patient about addition of new exercises with  HEP. Continue with pain control and soft tissue mobilization/manual therapy to upper/mid trap and cervical region. Progress stretching and add strengthening as appropriate. Begin addressing dynamic/gait balance to progress toward higher Berg scores (emphasis on components of Berg that pt scored low on).   PT Home Exercise Plan Added chin tucks and head presses to HEP. Consider addition of head movement with eyes closed to corner exercises as appropriate.   Consulted and Agree with Plan of Care Patient      Problem List Patient Active Problem List   Diagnosis Date Noted  . Falls 09/25/2015  . Musculoskeletal neck pain 09/25/2015  . Headache 09/25/2015  . Memory changes 09/25/2015  . Blurry vision 09/25/2015  . Helicobacter pylori (H. pylori) 12/22/2012  . REFLEX SYMPATHETIC DYSTROPHY 02/26/2009  . Personal history of colonic adenoma 02/25/2009  . PERSONAL HISTORY OF FAILED MODERATE SEDATION 02/25/2009  . HYPOTHYROIDISM 01/14/2008  . OBESITY 01/14/2008  . DEPRESSION/ANXIETY 01/14/2008  . ASTHMA 01/14/2008  . GERD 01/14/2008  . IRRITABLE BOWEL SYNDROME 01/14/2008  . ANOREXIA NERVOSA, HX OF 01/14/2008    Bayard Beaver, SPTA 12/06/2015, 12:49 PM  [facility] [address] [place], [place], [address] Phone: [phone]   Fax:  [phone]  Name: Jo [name] MRN: [id number] Date of Birth: [birth year]  This note has been reviewed and edited by supervising CI.  Willow Ora, PTA, [facility] [address], [place], [place] [address] [phone] 12/08/2015, 10:51 PM

## 2015-12-11 ENCOUNTER — Encounter: Payer: Self-pay | Admitting: Physical Therapy

## 2015-12-11 ENCOUNTER — Ambulatory Visit: Payer: PPO | Admitting: Physical Therapy

## 2015-12-11 DIAGNOSIS — R29898 Other symptoms and signs involving the musculoskeletal system: Secondary | ICD-10-CM

## 2015-12-11 DIAGNOSIS — M542 Cervicalgia: Secondary | ICD-10-CM

## 2015-12-11 DIAGNOSIS — R2689 Other abnormalities of gait and mobility: Secondary | ICD-10-CM

## 2015-12-11 DIAGNOSIS — R2681 Unsteadiness on feet: Secondary | ICD-10-CM

## 2015-12-11 NOTE — Therapy (Addendum)
[facility] [address] [place], [place], [address] Phone: [phone]   Fax:  [phone]  Physical Therapy Treatment  Patient Details  Name: Jo [name] MRN: [id number] Date of Birth: 09-27-1960 Referring Provider: Sarina Ill, MD  Encounter Date: 12/11/2015      PT End of Session - 12/11/15 1542    Visit Number 10   Number of Visits 18   Date for PT Re-Evaluation 12/27/15   Authorization Type Medicare G-Code & Progress note every 10th visit   PT Start Time 1403   PT Stop Time 1445   PT Time Calculation (min) 42 min   Equipment Utilized During Treatment Gait belt   Activity Tolerance Patient limited by pain   Behavior During Therapy [facility] for tasks assessed/performed      Past Medical History  Diagnosis Date  . GERD (gastroesophageal reflux disease)   . Asthma   . Anxiety and depression   . Irritable bowel syndrome (IBS)   . Obesity   . H/O gastric ulcer 1985  . History of anorexia nervosa   . History of bulimia   . Anemia   . Gastritis   . Colon polyp   . Fundic gland polyps of stomach, benign   . H. pylori infection 2013  . Arthritis   . Chronic headaches   . History of pneumonia   . COPD (chronic obstructive pulmonary disease) (HCC)     no o2  . RSD (reflex sympathetic dystrophy)   . Hypothyroidism   . Allergy   . Anxiety   . Depression   . Ulcer     gastric, duodenal ulcer  . RSD (reflex sympathetic dystrophy)     Past Surgical History  Procedure Laterality Date  . Thoracoscopy w/ thoracic sympathectomy      for RSD  . Cholecystectomy    . Nasal sinus surgery    . Tonsillectomy    . Knee arthroscopy  04/21/2012    right  . Cervix surgery    . Laparoscopic nissen fundoplication  [id number]/[id number]    Dr. Johnathan Hausen  . Lumbar spondylosis injected  2013    Dr. Mina Marble  . Colonoscopy    . Upper gastrointestinal endoscopy      There were no vitals filed for this visit.  Visit  Diagnosis:  Neck pain  Unsteadiness  Balance problems  Decreased range of motion of neck      Subjective Assessment - 12/11/15 1406    Subjective Patient reports no new falls. Patient reports no new pain. Patient states that she believes the modalities have relieved the neck pain some. Patient states she is still waiting on her increase in dosage for Fentanyl.    Pertinent History asthma, osteoarthritis, RSD, right ankle injury with tears from fall ~1.5 yr ago.    Patient Stated Goals She would like to walk without falling. Get rid of pain in head, be able to sleep.    Currently in Pain? Yes   Pain Score 7    Pain Location Neck   Pain Orientation Mid;Posterior   Pain Descriptors / Indicators Aching;Burning   Pain Type Chronic pain   Pain Onset More than a month ago   Pain Score 8   Pain Location Arm   Pain Descriptors / Indicators Aching;Other (Comment)  Patient states it feels like bugs are crawling under her skin.   Pain Type Chronic pain   Pain Onset More than a month ago  [facility] Adult PT Treatment/Exercise - 12/11/15 1411    Balance   Balance Assessed Yes   Standardized Balance Assessment   Standardized Balance Assessment Berg Balance Test   Berg Balance Test   Sit to Stand Able to stand without using hands and stabilize independently   Standing Unsupported Able to stand safely 2 minutes   Sitting with Back Unsupported but Feet Supported on Floor or Stool Able to sit safely and securely 2 minutes   Stand to Sit Sits safely with minimal use of hands   Transfers Able to transfer safely, minor use of hands   Standing Unsupported with Eyes Closed Able to stand 10 seconds with supervision   Standing Ubsupported with Feet Together Able to place feet together independently and stand 1 minute safely   From Standing, Reach Forward with Outstretched Arm Can reach confidently >25 cm (10")   From Standing Position, Pick up Object from Floor Able to pick up shoe safely and  easily   From Standing Position, Turn to Look Behind Over each Shoulder Looks behind from both sides and weight shifts well   Turn 360 Degrees Able to turn 360 degrees safely but slowly   Standing Unsupported, Alternately Place Feet on Step/Stool Able to stand independently and safely and complete 8 steps in 20 seconds   Standing Unsupported, One Foot in Front Able to plae foot ahead of the other independently and hold 30 seconds   Standing on One Leg Able to lift leg independently and hold 5-10 seconds   Total Score 51   Modalities   Modalities Electrical Stimulation;Ultrasound   Electrical Stimulation   Electrical Stimulation Location bil cervicals just at hair line   Electrical Stimulation Action Premod concurrent with Ultrasound x 8 minutes   Electrical Stimulation Parameters To tolerance (12.5V)   Electrical Stimulation Goals Pain   Ultrasound   Ultrasound Location L UT/Rhomboid   Ultrasound Parameters 1.5W/cm2, 100%, 1MHz x 8 minutes concurrent with E-Stim   Ultrasound Goals Pain          PT Education - 12/11/15 1541    Education provided Yes   Education Details Reviewed and added  stretch to HEP. See instructions.   Person(s) Educated Patient   Methods Explanation;Demonstration;Verbal cues;Handout   Comprehension Verbalized understanding;Returned demonstration;Verbal cues required          PT Short Term Goals - 12/04/15 1707    PT SHORT TERM GOAL #1   Title Patient demonstrates understanding of initial HEP. (Target Date: 11/28/2015)   Baseline Achieved 12/04/15   Time --   Period --   Status Achieved   PT SHORT TERM GOAL #2   Title Patient reports scalp /head and upper cervical pain improved 25%. (Target Date: 11/28/2015)   Baseline Achieved 12/04/15   Time --   Period --   Status Achieved   PT SHORT TERM GOAL #3   Title Cervical AROM >/= 75% of full AROM in all directions. (Target Date: 11/28/2015)   Baseline Patially met 12/04/15 patient has functional ROM in flexion,  extension, and bilateral rotation but is limited at 50% by pain in lateral flexion bilaterally.   Time --   Period --   Status Partially Met   PT SHORT TERM GOAL #4   Title Perform Timed Up - Go & set STG.    Baseline Patient achieved normal TUG at 8.97 seconds.   Time --   Period --   Status Achieved   PT SHORT TERM GOAL #5   Title  Patient ambulates 350' with single point cane with supervision including negotiating ramps & curbs. (Target Date: 11/28/2015)   Baseline Achieved 12/04/15 with patient ambulating 420' with single point cane with supervision including negotiating ramps, curbs, and stairs.   Time --   Period --   Status Achieved          PT Long Term Goals - 10/29/15 1540    PT LONG TERM GOAL #1   Title Patient verbalizes & demonstrates ongoing HEP for flexibility, strength, balance & endurance. (Target Date: 12/27/2015)   Time 2   Period Months   Status New   PT LONG TERM GOAL #2   Title Patient reports head / scalp and upper cervical pain 50% improvement to enable improved sleep. (Target Date: 12/27/2015)   Time 2   Period Months   Status New   PT LONG TERM GOAL #3   Title Neck Disability Index improved 10% per patient report via FOTO. (Target Date: 12/27/2015)   Time 2   Period Months   Status New   PT LONG TERM GOAL #4   Title Berg Balance > 45/56 to indicate lower fall risk. (Target Date: 12/27/2015)   Time 2   Period Months   Status New   PT LONG TERM GOAL #5   Title Patient ambulates 500' including grass, ramps, curbs & stairs (1 rail) with single point cane modified independent. (Target Date: 12/27/2015)   Time 2   Period Months   Status New          Plan - 12-16-15 1543    Clinical Impression Statement Skilled session assessing progress with balance activities using BERG Balance Scale and pain control. Patient score has improved to 51 from 37. Patient verbalizes increased comfort with several balance-related activities which were previously limited by pain,  range, or lack of balance. Patient is making consistent progress toward goals.   Pt will benefit from skilled therapeutic intervention in order to improve on the following deficits Abnormal gait;Decreased activity tolerance;Decreased balance;Decreased coordination;Decreased endurance;Decreased knowledge of use of DME;Decreased mobility;Decreased range of motion;Decreased strength;Dizziness;Impaired flexibility;Pain   Rehab Potential Good   Clinical Impairments Affecting Rehab Potential Chronic pain including RSD   PT Frequency 2x / week   PT Duration --  9 weeks (60 days)   PT Treatment/Interventions ADLs/Self Care Home Management;Biofeedback;Electrical Stimulation;Moist Heat;Traction;Ultrasound;DME Instruction;Gait training;Stair training;Neuromuscular re-education;Functional mobility training;Therapeutic activities;Therapeutic exercise;Balance training;Patient/family education;Manual techniques;Passive range of motion;Energy conservation;Vestibular   PT Next Visit Plan Follow up with patient about addition of new exercises with HEP. Continue with pain control and soft tissue mobilization/manual therapy to upper/mid trap and cervical region. Progress stretching and add strengthening as appropriate. Follow up with patient on medications - Fentanyl not yet adjusted.   PT Home Exercise Plan Added another stretch to HEP. Consider addition of head movement with eyes closed to corner exercises as appropriate.   Consulted and Agree with Plan of Care Patient         G-Codes - 12-16-15 1549    Functional Assessment Tool Used Patient's BERG Balance Score improved to 51/56 from 37/56.     Problem List Patient Active Problem List   Diagnosis Date Noted  . Falls 09/25/2015  . Musculoskeletal neck pain 09/25/2015  . Headache 09/25/2015  . Memory changes 09/25/2015  . Blurry vision 09/25/2015  . Helicobacter pylori (H. pylori) 12/22/2012  . REFLEX SYMPATHETIC DYSTROPHY 02/26/2009  . Personal history  of colonic adenoma 02/25/2009  . PERSONAL HISTORY OF FAILED MODERATE SEDATION 02/25/2009  .  HYPOTHYROIDISM 01/14/2008  . OBESITY 01/14/2008  . DEPRESSION/ANXIETY 01/14/2008  . ASTHMA 01/14/2008  . GERD 01/14/2008  . IRRITABLE BOWEL SYNDROME 01/14/2008  . ANOREXIA NERVOSA, HX OF 01/14/2008    Bayard Beaver, SPTA 2016-01-10, 3:51 PM  [facility] [address] [place], [place], [address] Phone: [phone]   Fax:  [phone]  Name: OAKLEY ORBAN MRN: [id number] Date of Birth: [birth year]  This note has been reviewed and edited by supervising CI.  Willow Ora, PTA, [place] [address] [place], [place] [address] [phone] 12/12/2015, 8:36 AM   Physical Therapy Progress Note  Dates of Reporting Period: 10/29/2015 to 01-10-16  Objective Reports of Subjective Statement: see above  Objective Measurements: see above  Goal Update: see above  Plan: continue plan of care  Reason Skilled Services are Required: Patient requires skilled care to progress mobility without increasing issues with chronic pain    01-10-16 1700  PT G-Codes  Functional Assessment Tool Used Patient's BERG Balance Score improved to 51/56 from 37/56.  Functional Limitation Mobility: Walking and moving around  Mobility: Walking and Moving Around Current Status [phone]) CJ  Mobility: Walking and Moving Around Goal Status [phone]) CJ    Jamey Reas, PT, DPT PT Specializing in [place] 12/13/2015 12:47 PM Phone:  [phone]  Fax:  [phone] [facility] [address] [place], [place] [address]

## 2015-12-11 NOTE — Patient Instructions (Signed)
Upper Limb Neural Tension: Radial I    Place right arm across low back and turn head down toward other side. Gently increase stretch by pulling down on head and depressing shoulder girdle- only do this if you do not feel a stretch with just looking down toward the other side. Repeat to the other side with left arm behind back and looking down toward the right. Repeat __3__ times per set. Do __1_ sets per session. Do _2_ sessions per day.  http://orth.exer.us/408   Copyright  VHI. All rights reserved.

## 2015-12-13 ENCOUNTER — Encounter: Payer: Self-pay | Admitting: Physical Therapy

## 2015-12-13 ENCOUNTER — Ambulatory Visit: Payer: PPO | Admitting: Physical Therapy

## 2015-12-13 DIAGNOSIS — R2689 Other abnormalities of gait and mobility: Secondary | ICD-10-CM

## 2015-12-13 DIAGNOSIS — M542 Cervicalgia: Secondary | ICD-10-CM | POA: Diagnosis not present

## 2015-12-13 DIAGNOSIS — R6889 Other general symptoms and signs: Secondary | ICD-10-CM

## 2015-12-13 DIAGNOSIS — M25612 Stiffness of left shoulder, not elsewhere classified: Secondary | ICD-10-CM

## 2015-12-13 DIAGNOSIS — R29898 Other symptoms and signs involving the musculoskeletal system: Secondary | ICD-10-CM

## 2015-12-13 DIAGNOSIS — R2681 Unsteadiness on feet: Secondary | ICD-10-CM

## 2015-12-13 DIAGNOSIS — M5382 Other specified dorsopathies, cervical region: Secondary | ICD-10-CM

## 2015-12-13 NOTE — Therapy (Signed)
[facility] [address] [place], [place], [address] Phone: [phone]   Fax:  [phone]  Physical Therapy Treatment  Patient Details  Name: Jo [name] MRN: [id number] Date of Birth: April 05, 1961 Referring Provider: Naomie Dean, MD  Encounter Date: 12/13/2015      PT End of Session - 12/13/15 1152    Visit Number 11   Number of Visits 18   Date for PT Re-Evaluation 12/27/15   Authorization Type Medicare G-Code & Progress note every 10th visit   PT Start Time 1107  Patient arrived late.   PT Stop Time 1148   PT Time Calculation (min) 41 min   Equipment Utilized During Treatment Gait belt   Activity Tolerance Patient limited by pain   Behavior During Therapy WFL for tasks assessed/performed      Past Medical History  Diagnosis Date  . GERD (gastroesophageal reflux disease)   . Asthma   . Anxiety and depression   . Irritable bowel syndrome (IBS)   . Obesity   . H/O gastric ulcer 1985  . History of anorexia nervosa   . History of bulimia   . Anemia   . Gastritis   . Colon polyp   . Fundic gland polyps of stomach, benign   . H. pylori infection 2013  . Arthritis   . Chronic headaches   . History of pneumonia   . COPD (chronic obstructive pulmonary disease) (HCC)     no o2  . RSD (reflex sympathetic dystrophy)   . Hypothyroidism   . Allergy   . Anxiety   . Depression   . Ulcer     gastric, duodenal ulcer  . RSD (reflex sympathetic dystrophy)     Past Surgical History  Procedure Laterality Date  . Thoracoscopy w/ thoracic sympathectomy      for RSD  . Cholecystectomy    . Nasal sinus surgery    . Tonsillectomy    . Knee arthroscopy  04/21/2012    right  . Cervix surgery    . Laparoscopic nissen fundoplication  09/05/2007    Dr. Luretha Murphy  . Lumbar spondylosis injected  2013    Dr. Regino Schultze  . Colonoscopy    . Upper gastrointestinal endoscopy      There were no vitals filed for this  visit.  Visit Diagnosis:  Neck pain  Unsteadiness  Balance problems  Decreased range of motion of neck  Decreased functional activity tolerance  Weakness of neck  Weakness of shoulder  Decreased range of motion of shoulder, left      Subjective Assessment - 12/13/15 1109    Subjective Patient reports no new falls. Patient reports no new pain. Patient states she is still waiting on her increase in dosage for Fentanyl and the orders have been sent.    Pertinent History asthma, osteoarthritis, RSD, right ankle injury with tears from fall ~1.5 yr ago.    Patient Stated Goals She would like to walk without falling. Get rid of pain in head, be able to sleep.    Currently in Pain? Yes   Pain Score 3    Pain Location Neck   Pain Orientation Mid   Pain Descriptors / Indicators Aching   Pain Onset More than a month ago   Pain Score 6   Pain Location Arm   Pain Orientation Left   Pain Type Other (Comment)  Patient states she feels like bugs are crawling inside her skin.   Pain Onset More  than a month ago          [facility] Adult PT Treatment/Exercise - 12/13/15 1149    Exercises   Exercises Neck   Modalities   Modalities Electrical Stimulation   Electrical Stimulation   Electrical Stimulation Location bil cervicals just at hair line   Electrical Stimulation Action TENS  concurrect with exercise   Electrical Stimulation Parameters To tolerance (13V)   Electrical Stimulation Goals Pain      Therapeutic Exercise: Lateral neck flexion stretch, cervical rotation stretch bilaterally 2x30" holds. Chin tucks 10x5" holds. Isometric cervical flexion 10x5" holds. Concurrent with TENS: Lateral neck flexion stretch, cervical rotation stretch bilaterally 10x5" holds. Chin tucks 10x5" holds. Isometric cervical flexion and rotation 10x5" holds each. Upper trap stretch bilaterally 5x15" holds.       PT Education - 12/13/15 1151    Education provided Yes   Education Details Reviewed HEP.  Prepared patient for home TENS with exercise when her new electrodes arrive.   Person(s) Educated Patient   Methods Explanation;Demonstration;Verbal cues   Comprehension Verbalized understanding;Returned demonstration;Verbal cues required          PT Short Term Goals - 12/04/15 1707    PT SHORT TERM GOAL #1   Title Patient demonstrates understanding of initial HEP. (Target Date: 11/28/2015)   Baseline Achieved 12/04/15   Time --   Period --   Status Achieved   PT SHORT TERM GOAL #2   Title Patient reports scalp /head and upper cervical pain improved 25%. (Target Date: 11/28/2015)   Baseline Achieved 12/04/15   Time --   Period --   Status Achieved   PT SHORT TERM GOAL #3   Title Cervical AROM >/= 75% of full AROM in all directions. (Target Date: 11/28/2015)   Baseline Patially met 12/04/15 patient has functional ROM in flexion, extension, and bilateral rotation but is limited at 50% by pain in lateral flexion bilaterally.   Time --   Period --   Status Partially Met   PT SHORT TERM GOAL #4   Title Perform Timed Up - Go & set STG.    Baseline Patient achieved normal TUG at 8.97 seconds.   Time --   Period --   Status Achieved   PT SHORT TERM GOAL #5   Title Patient ambulates 350' with single point cane with supervision including negotiating ramps & curbs. (Target Date: 11/28/2015)   Baseline Achieved 12/04/15 with patient ambulating 420' with single point cane with supervision including negotiating ramps, curbs, and stairs.   Time --   Period --   Status Achieved           PT Long Term Goals - 10/29/15 1540    PT LONG TERM GOAL #1   Title Patient verbalizes & demonstrates ongoing HEP for flexibility, strength, balance & endurance. (Target Date: 12/27/2015)   Time 2   Period Months   Status New   PT LONG TERM GOAL #2   Title Patient reports head / scalp and upper cervical pain 50% improvement to enable improved sleep. (Target Date: 12/27/2015)   Time 2   Period Months   Status  New   PT LONG TERM GOAL #3   Title Neck Disability Index improved 10% per patient report via FOTO. (Target Date: 12/27/2015)   Time 2   Period Months   Status New   PT LONG TERM GOAL #4   Title Berg Balance > 45/56 to indicate lower fall risk. (Target Date: 12/27/2015)   Time 2  Period Months   Status New   PT LONG TERM GOAL #5   Title Patient ambulates 500' including grass, ramps, curbs & stairs (1 rail) with single point cane modified independent. (Target Date: 12/27/2015)   Time 2   Period Months   Status New          Plan - 12/13/15 1153    Clinical Impression Statement Skilled session addressing neck pain/stiffness, static balance, and patient education for home TENS use concurrently with exercise.   Pt will benefit from skilled therapeutic intervention in order to improve on the following deficits Abnormal gait;Decreased activity tolerance;Decreased balance;Decreased coordination;Decreased endurance;Decreased knowledge of use of DME;Decreased mobility;Decreased range of motion;Decreased strength;Dizziness;Impaired flexibility;Pain   Rehab Potential Good   Clinical Impairments Affecting Rehab Potential Chronic pain including RSD   PT Frequency 2x / week   PT Duration --  9 weeks (60 days)   PT Treatment/Interventions ADLs/Self Care Home Management;Biofeedback;Electrical Stimulation;Moist Heat;Traction;Ultrasound;DME Instruction;Gait training;Stair training;Neuromuscular re-education;Functional mobility training;Therapeutic activities;Therapeutic exercise;Balance training;Patient/family education;Manual techniques;Passive range of motion;Energy conservation;Vestibular   PT Next Visit Plan Continue with education with home TENS concurrent with exercise. Continue with stretching and strengthening. Continue with static balance activities on compliant surface. Follow up with patient on medications - Fentanyl not yet adjusted.   PT Home Exercise Plan Consider addition of head movement with  eyes closed to corner exercises and compliant surfaces as appropriate.   Consulted and Agree with Plan of Care Patient      Problem List Patient Active Problem List   Diagnosis Date Noted  . Falls 09/25/2015  . Musculoskeletal neck pain 09/25/2015  . Headache 09/25/2015  . Memory changes 09/25/2015  . Blurry vision 09/25/2015  . Helicobacter pylori (H. pylori) 12/22/2012  . REFLEX SYMPATHETIC DYSTROPHY 02/26/2009  . Personal history of colonic adenoma 02/25/2009  . PERSONAL HISTORY OF FAILED MODERATE SEDATION 02/25/2009  . HYPOTHYROIDISM 01/14/2008  . OBESITY 01/14/2008  . DEPRESSION/ANXIETY 01/14/2008  . ASTHMA 01/14/2008  . GERD 01/14/2008  . IRRITABLE BOWEL SYNDROME 01/14/2008  . ANOREXIA NERVOSA, HX OF 01/14/2008    Bayard Beaver, SPTA 12/13/2015, 11:58 AM  [facility] [address] [place], [place], [address] Phone: [phone]   Fax:  [phone]  Name: Jo [name] MRN: [id number] Date of Birth: Apr 19, 1961   This note has been reviewed and edited by supervising CI.  Willow Ora, PTA, [facility] [address], [place], [place] [address] [phone] 12/15/2015, 11:45 PM

## 2015-12-18 ENCOUNTER — Encounter: Payer: Self-pay | Admitting: Physical Therapy

## 2015-12-18 ENCOUNTER — Ambulatory Visit: Payer: PPO | Admitting: Physical Therapy

## 2015-12-18 DIAGNOSIS — R29898 Other symptoms and signs involving the musculoskeletal system: Secondary | ICD-10-CM

## 2015-12-18 DIAGNOSIS — R269 Unspecified abnormalities of gait and mobility: Secondary | ICD-10-CM

## 2015-12-18 DIAGNOSIS — R6889 Other general symptoms and signs: Secondary | ICD-10-CM

## 2015-12-18 DIAGNOSIS — M542 Cervicalgia: Secondary | ICD-10-CM | POA: Diagnosis not present

## 2015-12-18 DIAGNOSIS — R2689 Other abnormalities of gait and mobility: Secondary | ICD-10-CM

## 2015-12-18 DIAGNOSIS — R2681 Unsteadiness on feet: Secondary | ICD-10-CM

## 2015-12-18 DIAGNOSIS — M5382 Other specified dorsopathies, cervical region: Secondary | ICD-10-CM

## 2015-12-18 NOTE — Patient Instructions (Signed)
Balance in corner: 1) stand with feet together close your eyes and try to balance for 20 seconds. If you loose your balance, open eyes and reestablish before attempting again. Repeat 5 times. 2) stand on foam with feet shoulder width apart. Eyes open. Balance for 20 seconds.  3) same as #2, try to balance with eyes closed.  Place paper with letters in front at eye level. stand with feet apart on floor. Repeat 10-20 times for each exercise.  4) eyes still. Focus on "A" turn head to right focus on "A" for 3-5 seconds. Turn head to left focus on "A" 5) same as #4, but move head up & down focusing on "A" 6) head still, move eyes to "B" focus for 3-5 seconds. Then move eyes to "C" & focus.  7) head still, move eyes to "D" focus for 3-5 seconds. Move to "E" & focus.

## 2015-12-19 NOTE — Therapy (Signed)
[facility] [address] [place], [place], [address] Phone: [phone]   Fax:  [phone]  Physical Therapy Treatment  Patient Details  Name: Jo [name] MRN: [id number] Date of Birth: [birth year] Referring Provider: Sarina Ill, MD  Encounter Date: 12/18/2015      PT End of Session - 12/18/15 1320    Visit Number 12   Number of Visits 18   Date for PT Re-Evaluation 12/27/15   Authorization Type Medicare G-Code & Progress note every 10th visit   PT Start Time 1233   PT Stop Time 1315   PT Time Calculation (min) 42 min   Equipment Utilized During Treatment Gait belt   Activity Tolerance Patient limited by pain   Behavior During Therapy [facility] for tasks assessed/performed      Past Medical History  Diagnosis Date  . GERD (gastroesophageal reflux disease)   . Asthma   . Anxiety and depression   . Irritable bowel syndrome (IBS)   . Obesity   . H/O gastric ulcer 1985  . History of anorexia nervosa   . History of bulimia   . Anemia   . Gastritis   . Colon polyp   . Fundic gland polyps of stomach, benign   . H. pylori infection 2013  . Arthritis   . Chronic headaches   . History of pneumonia   . COPD (chronic obstructive pulmonary disease) (HCC)     no o2  . RSD (reflex sympathetic dystrophy)   . Hypothyroidism   . Allergy   . Anxiety   . Depression   . Ulcer     gastric, duodenal ulcer  . RSD (reflex sympathetic dystrophy)     Past Surgical History  Procedure Laterality Date  . Thoracoscopy w/ thoracic sympathectomy      for RSD  . Cholecystectomy    . Nasal sinus surgery    . Tonsillectomy    . Knee arthroscopy  04/21/2012    right  . Cervix surgery    . Laparoscopic nissen fundoplication  44/31/5400    Dr. Johnathan Hausen  . Lumbar spondylosis injected  2013    Dr. Mina Marble  . Colonoscopy    . Upper gastrointestinal endoscopy      There were no vitals filed for this visit.  Visit  Diagnosis:  Neck pain  Unsteadiness  Balance problems  Decreased range of motion of neck  Decreased functional activity tolerance  Weakness of neck  Abnormality of gait      Subjective Assessment - 12/18/15 1230    Subjective Patient reports she is better but does not feel that she is fully back to level prior to recent excerbation.    Pertinent History asthma, osteoarthritis, RSD, right ankle injury with tears from fall ~1.5 yr ago.    Patient Stated Goals She would like to walk without falling. Get rid of pain in head, be able to sleep.    Currently in Pain? Yes   Pain Score 3    Pain Location Neck   Pain Orientation Mid   Pain Descriptors / Indicators Aching   Pain Type Chronic pain   Pain Onset More than a month ago   Pain Frequency Constant     Self-Care: Pt reports TENS use without issues. She got new electrodes. PT demo, instructed in use of Theracane for self massage to work on trigger points to upper back, scapula, upper LUE and elbow, lower back, neck, shoulder, buttocks and thigh using pdf instruction  manual. Patient verbalized and return demo understanding.   Therapeutic Exercise: See pt instructions with pt return demo understanding.                              PT Education - 12/18/15 1230    Education provided Yes   Education Details Theracane use including review pdf illustrating use on UE, back and LE.    Person(s) Educated Patient   Methods Explanation;Demonstration;Verbal cues;Other (comment)  internet pdf instruction manual for Theracane   Comprehension Verbalized understanding;Returned demonstration;Verbal cues required          PT Short Term Goals - 12/04/15 1707    PT SHORT TERM GOAL #1   Title Patient demonstrates understanding of initial HEP. (Target Date: 11/28/2015)   Baseline Achieved 12/04/15   Time --   Period --   Status Achieved   PT SHORT TERM GOAL #2   Title Patient reports scalp /head and upper cervical  pain improved 25%. (Target Date: 11/28/2015)   Baseline Achieved 12/04/15   Time --   Period --   Status Achieved   PT SHORT TERM GOAL #3   Title Cervical AROM >/= 75% of full AROM in all directions. (Target Date: 11/28/2015)   Baseline Patially met 12/04/15 patient has functional ROM in flexion, extension, and bilateral rotation but is limited at 50% by pain in lateral flexion bilaterally.   Time --   Period --   Status Partially Met   PT SHORT TERM GOAL #4   Title Perform Timed Up - Go & set STG.    Baseline Patient achieved normal TUG at 8.97 seconds.   Time --   Period --   Status Achieved   PT SHORT TERM GOAL #5   Title Patient ambulates 350' with single point cane with supervision including negotiating ramps & curbs. (Target Date: 11/28/2015)   Baseline Achieved 12/04/15 with patient ambulating 420' with single point cane with supervision including negotiating ramps, curbs, and stairs.   Time --   Period --   Status Achieved           PT Long Term Goals - 10/29/15 1540    PT LONG TERM GOAL #1   Title Patient verbalizes & demonstrates ongoing HEP for flexibility, strength, balance & endurance. (Target Date: 12/27/2015)   Time 2   Period Months   Status New   PT LONG TERM GOAL #2   Title Patient reports head / scalp and upper cervical pain 50% improvement to enable improved sleep. (Target Date: 12/27/2015)   Time 2   Period Months   Status New   PT LONG TERM GOAL #3   Title Neck Disability Index improved 10% per patient report via FOTO. (Target Date: 12/27/2015)   Time 2   Period Months   Status New   PT LONG TERM GOAL #4   Title Berg Balance > 45/56 to indicate lower fall risk. (Target Date: 12/27/2015)   Time 2   Period Months   Status New   PT LONG TERM GOAL #5   Title Patient ambulates 500' including grass, ramps, curbs & stairs (1 rail) with single point cane modified independent. (Target Date: 12/27/2015)   Time 2   Period Months   Status New               Plan  - 12/18/15 1330    Clinical Impression Statement Patient reports Theracane was very helpful self massage to  work on trigger points and plans to purchase one. Patient reported during session some falls related to turning or scanning and limits scanning while walking which has caused some difficulty. Patient seems to understand  updated HEP. She appears to have met LTGs associated with neck issues but may need ~1 week more to fully address balance issues / LTGs.    Pt will benefit from skilled therapeutic intervention in order to improve on the following deficits Abnormal gait;Decreased activity tolerance;Decreased balance;Decreased coordination;Decreased endurance;Decreased knowledge of use of DME;Decreased mobility;Decreased range of motion;Decreased strength;Dizziness;Impaired flexibility;Pain   Rehab Potential Good   Clinical Impairments Affecting Rehab Potential Chronic pain including RSD   PT Frequency 2x / week   PT Duration --  9 weeks (60 days)   PT Treatment/Interventions ADLs/Self Care Home Management;Biofeedback;Electrical Stimulation;Moist Heat;Traction;Ultrasound;DME Instruction;Gait training;Stair training;Neuromuscular re-education;Functional mobility training;Therapeutic activities;Therapeutic exercise;Balance training;Patient/family education;Manual techniques;Passive range of motion;Energy conservation;Vestibular   PT Next Visit Plan Check LTGs, perform components of Functional Gait Assessment and if unsafe continue PT one more week. If safe, discharge at next visit.    PT Home Exercise Plan Consider addition of head movement with eyes closed to corner exercises and compliant surfaces as appropriate.   Consulted and Agree with Plan of Care Patient        Problem List Patient Active Problem List   Diagnosis Date Noted  . Falls 09/25/2015  . Musculoskeletal neck pain 09/25/2015  . Headache 09/25/2015  . Memory changes 09/25/2015  . Blurry vision 09/25/2015  . Helicobacter  pylori (H. pylori) 12/22/2012  . REFLEX SYMPATHETIC DYSTROPHY 02/26/2009  . Personal history of colonic adenoma 02/25/2009  . PERSONAL HISTORY OF FAILED MODERATE SEDATION 02/25/2009  . HYPOTHYROIDISM 01/14/2008  . OBESITY 01/14/2008  . DEPRESSION/ANXIETY 01/14/2008  . ASTHMA 01/14/2008  . GERD 01/14/2008  . IRRITABLE BOWEL SYNDROME 01/14/2008  . ANOREXIA NERVOSA, HX OF 01/14/2008    Jamey Reas PT, DPT 12/19/2015, 12:24 PM  [facility] [address] [place], [place], [address] Phone: [phone]   Fax:  [phone]  Name: Jo [name] MRN: [id number] Date of Birth: 10-Aug-1961

## 2015-12-20 ENCOUNTER — Ambulatory Visit: Payer: PPO | Admitting: Physical Therapy

## 2015-12-20 ENCOUNTER — Encounter: Payer: Self-pay | Admitting: Physical Therapy

## 2015-12-20 DIAGNOSIS — R269 Unspecified abnormalities of gait and mobility: Secondary | ICD-10-CM

## 2015-12-20 DIAGNOSIS — R2681 Unsteadiness on feet: Secondary | ICD-10-CM

## 2015-12-20 DIAGNOSIS — R519 Headache, unspecified: Secondary | ICD-10-CM

## 2015-12-20 DIAGNOSIS — R51 Headache: Secondary | ICD-10-CM

## 2015-12-20 DIAGNOSIS — R6889 Other general symptoms and signs: Secondary | ICD-10-CM

## 2015-12-20 DIAGNOSIS — M542 Cervicalgia: Secondary | ICD-10-CM

## 2015-12-20 DIAGNOSIS — G4489 Other headache syndrome: Secondary | ICD-10-CM

## 2015-12-20 DIAGNOSIS — M5382 Other specified dorsopathies, cervical region: Secondary | ICD-10-CM

## 2015-12-20 DIAGNOSIS — R2689 Other abnormalities of gait and mobility: Secondary | ICD-10-CM

## 2015-12-20 DIAGNOSIS — R29898 Other symptoms and signs involving the musculoskeletal system: Secondary | ICD-10-CM

## 2015-12-20 NOTE — Patient Instructions (Signed)
Carpeted Surface With Side to Side Head Motion    Perform without assistive device. Walking on carpet, turn head and eyes left for __2-3__ steps. Then, turn head and eyes to opposite side for _2-3___ steps. Repeat sequence __4__ laps total down hall. Do _1 sessions per day.   Copyright  VHI. All rights reserved.  Carpeted Surface With Up / Down Head Motion    Perform without assistive device. Walking on carpet, move head and eyes toward ceiling for _2-3___ steps. Then, move head and eyes toward floor for _2-3___ steps. Repeat sequence __4__ laps per session. Do _1_ sessions per day. Repeat in dimly lit room.  Copyright  VHI. All rights reserved.  Feet Heel-Toe "Tandem"    Arms outstretched, walk a straight line bringing one foot directly in front of the other. Repeat for 4 laps each session. Do __1__ sessions per day.  Copyright  VHI. All rights reserved.  Figure Eight    Walk in a figure eight pattern around objects on floor (can use up to 4 objects to weave around) Repeat __4__ laps per session. Do _1__ sessions per day. Repeat on compliant surface: _carpeted hallway.  Copyright  VHI. All rights reserved.

## 2015-12-20 NOTE — Therapy (Signed)
[facility] [address] [place], [place], [address] Phone: [phone]   Fax:  [phone]  Physical Therapy Treatment  Patient Details  Name: Jo [name] MRN: [id number] Date of Birth: 10/11/1960 Referring Provider: Sarina Ill, MD  Encounter Date: 12/20/2015      PT End of Session - 12/20/15 1113    Visit Number 13   Number of Visits 18   Date for PT Re-Evaluation 12/27/15   Authorization Type Medicare G-Code & Progress note every 10th visit   PT Start Time 1110   PT Stop Time 1200   PT Time Calculation (min) 50 min   Equipment Utilized During Treatment Gait belt   Activity Tolerance Patient limited by pain   Behavior During Therapy [facility] for tasks assessed/performed      Past Medical History  Diagnosis Date  . GERD (gastroesophageal reflux disease)   . Asthma   . Anxiety and depression   . Irritable bowel syndrome (IBS)   . Obesity   . H/O gastric ulcer 1985  . History of anorexia nervosa   . History of bulimia   . Anemia   . Gastritis   . Colon polyp   . Fundic gland polyps of stomach, benign   . H. pylori infection 2013  . Arthritis   . Chronic headaches   . History of pneumonia   . COPD (chronic obstructive pulmonary disease) (HCC)     no o2  . RSD (reflex sympathetic dystrophy)   . Hypothyroidism   . Allergy   . Anxiety   . Depression   . Ulcer     gastric, duodenal ulcer  . RSD (reflex sympathetic dystrophy)     Past Surgical History  Procedure Laterality Date  . Thoracoscopy w/ thoracic sympathectomy      for RSD  . Cholecystectomy    . Nasal sinus surgery    . Tonsillectomy    . Knee arthroscopy  04/21/2012    right  . Cervix surgery    . Laparoscopic nissen fundoplication  40/97/3532    Dr. Johnathan Hausen  . Lumbar spondylosis injected  2013    Dr. Mina Marble  . Colonoscopy    . Upper gastrointestinal endoscopy      There were no vitals filed for this visit.  Visit  Diagnosis:  Neck pain  Unsteadiness  Balance problems  Decreased range of motion of neck  Decreased functional activity tolerance  Weakness of neck  Abnormality of gait  Other headache syndrome  Scalp pain      Subjective Assessment - 12/20/15 1111    Subjective No new complaints. No falls to report. Reports having a stressful am due to her dog being sick (?UTI) and having to set vet appointment.   Pertinent History asthma, osteoarthritis, RSD, right ankle injury with tears from fall ~1.5 yr ago.    Patient Stated Goals She would like to walk without falling. Get rid of pain in head, be able to sleep.    Currently in Pain? Yes   Pain Score 6    Pain Location Neck   Pain Descriptors / Indicators Aching;Sore   Pain Type Chronic pain   Pain Radiating Towards down left arm   Pain Onset More than a month ago   Pain Frequency Constant   Aggravating Factors  using left arm, certain neck movements, rainy weather   Pain Relieving Factors resting arm, pain medicaiton, stretching  [facility] PT Assessment - 12/20/15 1121    Ambulation/Gait   Ambulation/Gait Yes   Ambulation/Gait Assistance 6: Modified independent (Device/Increase time)   Ambulation Distance (Feet) 420 Feet  or more   Assistive device Straight cane   Gait Pattern Step-through pattern;Decreased stride length   Ambulation Surface Level;Unlevel;Indoor;Outdoor;Paved   Stairs Yes   Stairs Assistance 6: Modified independent (Device/Increase time)   Stair Management Technique One rail Right;Alternating pattern;With cane   Number of Stairs 4   Ramp 6: Modified independent (Device)   Ramp Details (indicate cue type and reason) with cane on outdoor ramps   Curb 6: Modified independent (Device/increase time)   Curb Details (indicate cue type and reason) with cane on indoor 6 inch curb   Berg Balance Test   Sit to Stand Able to stand without using hands and stabilize independently   Standing Unsupported Able  to stand safely 2 minutes   Sitting with Back Unsupported but Feet Supported on Floor or Stool Able to sit safely and securely 2 minutes   Stand to Sit Sits safely with minimal use of hands   Transfers Able to transfer safely, minor use of hands   Standing Unsupported with Eyes Closed Able to stand 10 seconds safely   Standing Ubsupported with Feet Together Able to place feet together independently and stand 1 minute safely   From Standing, Reach Forward with Outstretched Arm Can reach confidently >25 cm (10")   From Standing Position, Pick up Object from Floor Able to pick up shoe safely and easily   From Standing Position, Turn to Look Behind Over each Shoulder Looks behind from both sides and weight shifts well   Turn 360 Degrees Able to turn 360 degrees safely in 4 seconds or less  < 4 sec's both ways   Standing Unsupported, Alternately Place Feet on Step/Stool Able to stand independently and safely and complete 8 steps in 20 seconds  7.75 sec's   Standing Unsupported, One Foot in Front Able to plae foot ahead of the other independently and hold 30 seconds   Standing on One Leg Able to lift leg independently and hold 5-10 seconds   Total Score 54   Dynamic Gait Index   Level Surface Normal   Change in Gait Speed Mild Impairment   Gait with Horizontal Head Turns Mild Impairment   Gait with Vertical Head Turns Mild Impairment   Gait and Pivot Turn Normal   Step Over Obstacle Mild Impairment   Step Around Obstacles Normal   Steps Mild Impairment   Total Score 19   Functional Gait  Assessment   Gait assessed  Yes   Gait Level Surface Walks 20 ft in less than 5.5 sec, no assistive devices, good speed, no evidence for imbalance, normal gait pattern, deviates no more than 6 in outside of the 12 in walkway width.   Change in Gait Speed Able to change speed, demonstrates mild gait deviations, deviates 6-10 in outside of the 12 in walkway width, or no gait deviations, unable to achieve a major  change in velocity, or uses a change in velocity, or uses an assistive device.   Gait with Horizontal Head Turns Performs head turns smoothly with slight change in gait velocity (eg, minor disruption to smooth gait path), deviates 6-10 in outside 12 in walkway width, or uses an assistive device.   Gait with Vertical Head Turns Performs task with slight change in gait velocity (eg, minor disruption to smooth gait path), deviates 6 - 10  in outside 12 in walkway width or uses assistive device   Gait and Pivot Turn Pivot turns safely within 3 sec and stops quickly with no loss of balance.   Step Over Obstacle Is able to step over one shoe box (4.5 in total height) without changing gait speed. No evidence of imbalance.   Gait with Narrow Base of Support Is able to ambulate for 10 steps heel to toe with no staggering.   Gait with Eyes Closed Walks 20 ft, no assistive devices, good speed, no evidence of imbalance, normal gait pattern, deviates no more than 6 in outside 12 in walkway width. Ambulates 20 ft in less than 7 sec.   Ambulating Backwards Walks 20 ft, no assistive devices, good speed, no evidence for imbalance, normal gait   Steps Alternating feet, must use rail.   Total Score 25           [facility] Adult PT Treatment/Exercise - 12/20/15 1121    Ambulation/Gait   Ambulation/Gait Assistance Details did not ambulate on grass outside due to wet from rain this am, pt reports no issues with this at home with walking dog.            PT Education - 12/20/15 1148    Education provided Yes   Education Details advanced  balance HEP   Person(s) Educated Patient   Methods Explanation;Demonstration;Handout   Comprehension Verbalized understanding;Returned demonstration          PT Short Term Goals - 12/04/15 1707    PT SHORT TERM GOAL #1   Title Patient demonstrates understanding of initial HEP. (Target Date: 11/28/2015)   Baseline Achieved 12/04/15   Time --   Period --   Status Achieved    PT SHORT TERM GOAL #2   Title Patient reports scalp /head and upper cervical pain improved 25%. (Target Date: 11/28/2015)   Baseline Achieved 12/04/15   Time --   Period --   Status Achieved   PT SHORT TERM GOAL #3   Title Cervical AROM >/= 75% of full AROM in all directions. (Target Date: 11/28/2015)   Baseline Patially met 12/04/15 patient has functional ROM in flexion, extension, and bilateral rotation but is limited at 50% by pain in lateral flexion bilaterally.   Time --   Period --   Status Partially Met   PT SHORT TERM GOAL #4   Title Perform Timed Up - Go & set STG.    Baseline Patient achieved normal TUG at 8.97 seconds.   Time --   Period --   Status Achieved   PT SHORT TERM GOAL #5   Title Patient ambulates 350' with single point cane with supervision including negotiating ramps & curbs. (Target Date: 11/28/2015)   Baseline Achieved 12/04/15 with patient ambulating 420' with single point cane with supervision including negotiating ramps, curbs, and stairs.   Time --   Period --   Status Achieved           PT Long Term Goals - 12/20/15 1114    PT LONG TERM GOAL #1   Title Patient verbalizes & demonstrates ongoing HEP for flexibility, strength, balance & endurance. (Target Date: 12/27/2015)   Baseline 31/17: Independent with current HEP to date   Period Months   Status Achieved   PT LONG TERM GOAL #2   Title Patient reports head / scalp and upper cervical pain 50% improvement to enable improved sleep. (Target Date: 12/27/2015)   Baseline 12/20/15: Self reports >/= 50% improvement, depending on  the day   Status Achieved   PT LONG TERM GOAL #3   Title Neck Disability Index improved 10% per patient report via FOTO. (Target Date: 12/27/2015)   Baseline 01/15/16: 36% today (76% on eval)   Time --   Period --   Status Achieved   PT LONG TERM GOAL #4   Title Berg Balance > 45/56 to indicate lower fall risk. (Target Date: 12/27/2015)   Baseline Jan 15, 2016: pt scored 54/56 today   Time --    Period --   Status Achieved   PT LONG TERM GOAL #5   Title Patient ambulates 500' including grass, ramps, curbs & stairs (1 rail) with single point cane modified independent. (Target Date: 12/27/2015)   Baseline met on 01/15/16.    Status Achieved            Plan - January 15, 2016 1114    Clinical Impression Statement Pt has met all LTGs today. Pt safe with FGA/DGI, scoring in low fall risk on both of these. Advanced her HEP to incorporated the more challenging aspects at home to further work on balance. Pt agreeable to discharge today.   Pt will benefit from skilled therapeutic intervention in order to improve on the following deficits Abnormal gait;Decreased activity tolerance;Decreased balance;Decreased coordination;Decreased endurance;Decreased knowledge of use of DME;Decreased mobility;Decreased range of motion;Decreased strength;Dizziness;Impaired flexibility;Pain   Rehab Potential Good   Clinical Impairments Affecting Rehab Potential Chronic pain including RSD   PT Frequency 2x / week   PT Duration --  9 weeks (60 days)   PT Treatment/Interventions ADLs/Self Care Home Management;Biofeedback;Electrical Stimulation;Moist Heat;Traction;Ultrasound;DME Instruction;Gait training;Stair training;Neuromuscular re-education;Functional mobility training;Therapeutic activities;Therapeutic exercise;Balance training;Patient/family education;Manual techniques;Passive range of motion;Energy conservation;Vestibular   PT Next Visit Plan discharge today per PT plan from previous session.   PT Home Exercise Plan Consider addition of head movement with eyes closed to corner exercises and compliant surfaces as appropriate.   Consulted and Agree with Plan of Care Patient          G-Codes - 15-Jan-2016 1149    Functional Assessment Tool Used Berg balance test 54/56 today.       Problem List Patient Active Problem List   Diagnosis Date Noted  . Falls 09/25/2015  . Musculoskeletal neck pain 09/25/2015  .  Headache 09/25/2015  . Memory changes 09/25/2015  . Blurry vision 09/25/2015  . Helicobacter pylori (H. pylori) 12/22/2012  . REFLEX SYMPATHETIC DYSTROPHY 02/26/2009  . Personal history of colonic adenoma 02/25/2009  . PERSONAL HISTORY OF FAILED MODERATE SEDATION 02/25/2009  . HYPOTHYROIDISM 01/14/2008  . OBESITY 01/14/2008  . DEPRESSION/ANXIETY 01/14/2008  . ASTHMA 01/14/2008  . GERD 01/14/2008  . IRRITABLE BOWEL SYNDROME 01/14/2008  . ANOREXIA NERVOSA, HX OF 01/14/2008    Willow Ora, PTA, [facility] [address], [place], [place] [address] [phone] 2016-01-15, 12:48 PM   Name: Jo [name] MRN: [id number] Date of Birth: 29-Mar-1961       G-Codes - 01-15-2016 1149    Functional Assessment Tool Used Berg balance test 54/56 today.    Functional Limitation Mobility: Walking and moving around   Mobility: Walking and Moving Around Goal Status [phone]) At least 20 percent but less than 40 percent impaired, limited or restricted   Mobility: Walking and Moving Around Discharge Status [id number]) At least 1 percent but less than 20 percent impaired, limited or restricted      PHYSICAL THERAPY DISCHARGE SUMMARY  Visits from Start of Care: 13  Current functional level related to goals /  functional outcomes: See above   Remaining deficits: See above   Education / Equipment: HEP, pain management  Plan: Patient agrees to discharge.  Patient goals were met. Patient is being discharged due to meeting the stated rehab goals.  ?????        Jamey Reas, PT, DPT PT Specializing in [place] 12/20/2015 7:55 PM Phone:  [phone]  Fax:  [phone] [facility] [address] [place], [place] [address]

## 2016-01-29 DIAGNOSIS — G4700 Insomnia, unspecified: Secondary | ICD-10-CM | POA: Diagnosis not present

## 2016-01-29 DIAGNOSIS — K219 Gastro-esophageal reflux disease without esophagitis: Secondary | ICD-10-CM | POA: Diagnosis not present

## 2016-01-29 DIAGNOSIS — F324 Major depressive disorder, single episode, in partial remission: Secondary | ICD-10-CM | POA: Diagnosis not present

## 2016-02-12 DIAGNOSIS — M791 Myalgia: Secondary | ICD-10-CM | POA: Diagnosis not present

## 2016-02-12 DIAGNOSIS — Z79891 Long term (current) use of opiate analgesic: Secondary | ICD-10-CM | POA: Diagnosis not present

## 2016-02-12 DIAGNOSIS — R51 Headache: Secondary | ICD-10-CM | POA: Diagnosis not present

## 2016-02-12 DIAGNOSIS — G894 Chronic pain syndrome: Secondary | ICD-10-CM | POA: Diagnosis not present

## 2016-02-12 DIAGNOSIS — M542 Cervicalgia: Secondary | ICD-10-CM | POA: Diagnosis not present

## 2016-05-27 DIAGNOSIS — M542 Cervicalgia: Secondary | ICD-10-CM | POA: Diagnosis not present

## 2016-05-27 DIAGNOSIS — G90512 Complex regional pain syndrome I of left upper limb: Secondary | ICD-10-CM | POA: Diagnosis not present

## 2016-05-27 DIAGNOSIS — G894 Chronic pain syndrome: Secondary | ICD-10-CM | POA: Diagnosis not present

## 2016-05-27 DIAGNOSIS — Z79891 Long term (current) use of opiate analgesic: Secondary | ICD-10-CM | POA: Diagnosis not present

## 2016-10-20 DIAGNOSIS — E039 Hypothyroidism, unspecified: Secondary | ICD-10-CM | POA: Diagnosis not present

## 2016-11-18 DIAGNOSIS — Z79891 Long term (current) use of opiate analgesic: Secondary | ICD-10-CM | POA: Diagnosis not present

## 2016-11-18 DIAGNOSIS — M542 Cervicalgia: Secondary | ICD-10-CM | POA: Diagnosis not present

## 2016-11-18 DIAGNOSIS — M5481 Occipital neuralgia: Secondary | ICD-10-CM | POA: Diagnosis not present

## 2016-11-18 DIAGNOSIS — M791 Myalgia: Secondary | ICD-10-CM | POA: Diagnosis not present

## 2016-11-18 DIAGNOSIS — G90512 Complex regional pain syndrome I of left upper limb: Secondary | ICD-10-CM | POA: Diagnosis not present

## 2016-11-18 DIAGNOSIS — G894 Chronic pain syndrome: Secondary | ICD-10-CM | POA: Diagnosis not present

## 2017-02-17 DIAGNOSIS — G894 Chronic pain syndrome: Secondary | ICD-10-CM | POA: Diagnosis not present

## 2017-02-17 DIAGNOSIS — Z79891 Long term (current) use of opiate analgesic: Secondary | ICD-10-CM | POA: Diagnosis not present

## 2017-02-17 DIAGNOSIS — M791 Myalgia: Secondary | ICD-10-CM | POA: Diagnosis not present

## 2017-02-17 DIAGNOSIS — G90512 Complex regional pain syndrome I of left upper limb: Secondary | ICD-10-CM | POA: Diagnosis not present

## 2017-02-17 DIAGNOSIS — M542 Cervicalgia: Secondary | ICD-10-CM | POA: Diagnosis not present

## 2017-02-17 DIAGNOSIS — M5481 Occipital neuralgia: Secondary | ICD-10-CM | POA: Diagnosis not present

## 2017-03-19 ENCOUNTER — Telehealth: Payer: Self-pay | Admitting: Internal Medicine

## 2017-03-19 NOTE — Telephone Encounter (Signed)
Patient reports that she is having terrible abdominal pain and heartburn with nausea.  She is asking to be seen today.  She is advised that unfortunately there are no available appts today.  She is offered an appt for 03/25/17.  She does not feel that she can wait that long.  She is advised that she will need to go to ED or Urgent care if she is in that much pain and can't wait until next week. She is scheduled for 03/25/17 with Tye Savoy RNP

## 2017-03-25 ENCOUNTER — Other Ambulatory Visit (INDEPENDENT_AMBULATORY_CARE_PROVIDER_SITE_OTHER): Payer: PPO

## 2017-03-25 ENCOUNTER — Ambulatory Visit (INDEPENDENT_AMBULATORY_CARE_PROVIDER_SITE_OTHER): Payer: PPO | Admitting: Nurse Practitioner

## 2017-03-25 ENCOUNTER — Encounter: Payer: Self-pay | Admitting: Nurse Practitioner

## 2017-03-25 ENCOUNTER — Other Ambulatory Visit: Payer: PPO

## 2017-03-25 VITALS — BP 102/70 | HR 78 | Ht 63.5 in | Wt 177.4 lb

## 2017-03-25 DIAGNOSIS — R110 Nausea: Secondary | ICD-10-CM | POA: Diagnosis not present

## 2017-03-25 DIAGNOSIS — R1319 Other dysphagia: Secondary | ICD-10-CM

## 2017-03-25 DIAGNOSIS — R1310 Dysphagia, unspecified: Secondary | ICD-10-CM | POA: Diagnosis not present

## 2017-03-25 DIAGNOSIS — R1013 Epigastric pain: Secondary | ICD-10-CM

## 2017-03-25 DIAGNOSIS — K5909 Other constipation: Secondary | ICD-10-CM

## 2017-03-25 LAB — CBC
HEMATOCRIT: 38.3 % (ref 36.0–46.0)
HEMOGLOBIN: 12.6 g/dL (ref 12.0–15.0)
MCHC: 33 g/dL (ref 30.0–36.0)
MCV: 88.1 fl (ref 78.0–100.0)
PLATELETS: 297 10*3/uL (ref 150.0–400.0)
RBC: 4.34 Mil/uL (ref 3.87–5.11)
RDW: 13.7 % (ref 11.5–15.5)
WBC: 5.9 10*3/uL (ref 4.0–10.5)

## 2017-03-25 LAB — COMPREHENSIVE METABOLIC PANEL
ALBUMIN: 4.3 g/dL (ref 3.5–5.2)
ALT: 7 U/L (ref 0–35)
AST: 19 U/L (ref 0–37)
Alkaline Phosphatase: 65 U/L (ref 39–117)
BUN: 16 mg/dL (ref 6–23)
CALCIUM: 9.4 mg/dL (ref 8.4–10.5)
CHLORIDE: 102 meq/L (ref 96–112)
CO2: 27 meq/L (ref 19–32)
CREATININE: 0.95 mg/dL (ref 0.40–1.20)
GFR: 65.25 mL/min (ref >60.00)
Glucose, Bld: 87 mg/dL (ref 70–99)
Potassium: 4.1 mEq/L (ref 3.5–5.1)
Sodium: 137 mEq/L (ref 135–145)
Total Bilirubin: 0.3 mg/dL (ref 0.2–1.2)
Total Protein: 7.4 g/dL (ref 6.0–8.3)

## 2017-03-25 MED ORDER — SUCRALFATE 1 G PO TABS: 1.0000 g | ORAL_TABLET | Freq: Three times a day (TID) | ORAL | 3 refills | Status: DC

## 2017-03-25 MED ORDER — ONDANSETRON HCL 8 MG PO TABS
8.0000 mg | ORAL_TABLET | Freq: Three times a day (TID) | ORAL | 0 refills | Status: DC | PRN
Start: 2017-03-25 — End: 2018-04-27

## 2017-03-25 NOTE — Progress Notes (Addendum)
HPI:  Jo [name] is a 56 year old female known to Dr. Carlean Purl for GERD,  IBS and adenomatous colon polyps (2008). Patient has RSD,depression, and chronic pain managed by Dr. Melford Aase with [facility] . She comes in today for evaluation of abdominal pain. Last Tuesday she developed burning pain in upper abdomen radiating through to mid back and upward into her chest .   The pain has been constant since  onset. She has a history of GERD,  on BID Zegerid for years.and takes BID Zantac as well.  This burning pain does not feel like heartburn, it is much worse. No regurgitation. Patient complains of chronic nausea. No NSAID use. Patient is on a bland diet. Lately she has been heaving,  does not vomit because of her fundoplication though recalls an episode of vomiting several months ago. Felt like something popped or "tore" at the time.  She gives a history of chronic solid food dysphagia, worse over the last couple of months.She has chronic constipation, takes Senna but is doesn't help much.     Past Medical History:  Diagnosis Date  . Allergy   . Anemia   . Anxiety   . Anxiety and depression   . Arthritis   . Asthma   . Chronic headaches   . Colon polyp   . COPD (chronic obstructive pulmonary disease) (HCC)    no o2  . Depression   . Fundic gland polyps of stomach, benign   . Gastritis   . GERD (gastroesophageal reflux disease)   . H. pylori infection 2013  . H/O gastric ulcer 1985  . History of anorexia nervosa   . History of bulimia   . History of pneumonia   . Hypothyroidism   . Irritable bowel syndrome (IBS)   . Obesity   . RSD (reflex sympathetic dystrophy)   . RSD (reflex sympathetic dystrophy)   . Ulcer    gastric, duodenal ulcer     Past Surgical History:  Procedure Laterality Date  . CERVIX SURGERY    . CHOLECYSTECTOMY    . COLONOSCOPY    . KNEE ARTHROSCOPY  04/21/2012   right  . LAPAROSCOPIC NISSEN FUNDOPLICATION  45/11/8880   Dr. Johnathan Hausen  . lumbar spondylosis  injected  2013   Dr. Mina Marble  . NASAL SINUS SURGERY    . THORACOSCOPY W/ THORACIC SYMPATHECTOMY     for RSD  . TONSILLECTOMY    . UPPER GASTROINTESTINAL ENDOSCOPY     Family History  Problem Relation Age of Onset  . Colon polyps Mother        part of intestines removed  . Raynaud syndrome Mother   . Irritable bowel syndrome Mother   . Other Mother        cold agluten  . Colon polyps Brother   . Kidney Stones Brother   . Breast cancer Maternal Aunt   . Lung cancer Maternal Grandfather   . Colon cancer Neg Hx   . Esophageal cancer Neg Hx   . Rectal cancer Neg Hx   . Stomach cancer Neg Hx   . Migraines Neg Hx   . Seizures Neg Hx   . Stroke Neg Hx    Social History  Substance Use Topics  . Smoking status: Never Smoker  . Smokeless tobacco: Never Used  . Alcohol use No   Current Outpatient Prescriptions  Medication Sig Dispense Refill  . busPIRone (BUSPAR) 15 MG tablet Take 1/2 tablet twice a day    . cholecalciferol (  VITAMIN D) 1000 UNITS tablet Take 2,000 Units by mouth daily.    . Cranberry 200 MG CAPS Take 200 mg by mouth 2 (two) times daily.    Marland Kitchen doxepin (SINEQUAN) 10 MG capsule Take 20 mg by mouth at bedtime.     Marland Kitchen ESTRADIOL ACETATE PO Take 1 tablet by mouth daily. 1 mg tablets    . HYDROmorphone (DILAUDID) 4 MG tablet Take 4 mg by mouth every 4 (four) hours as needed.    . lamoTRIgine (LAMICTAL) 100 MG tablet Take 100 mg by mouth 2 (two) times daily.     . medroxyPROGESTERone (PROVERA) 2.5 MG tablet Take 2.5 mg by mouth. 1 tab every other day  2  . metFORMIN (GLUCOPHAGE) 500 MG tablet Take 1 tablet by mouth in the morning and 1/2 at night    . methadone (DOLOPHINE) 10 MG tablet Reported on 11/15/2015    . montelukast (SINGULAIR) 10 MG tablet Take 10 mg by mouth at bedtime.    Earney Navy Bicarbonate (ZEGERID PO) Take 1 capsule by mouth daily.     . ondansetron (ZOFRAN) 4 MG tablet Take 1 tablet (4 mg total) by mouth every 8 (eight) hours as needed for nausea. 20  tablet 2  . ranitidine (ZANTAC) 150 MG tablet Take 150 mg by mouth 2 (two) times daily.  3  . tiZANidine (ZANAFLEX) 4 MG capsule Take 4 mg by mouth 3 (three) times daily.    Marland Kitchen topiramate (TOPAMAX) 50 MG tablet Take 50 mg by mouth. 1 tab every morning and 3 tab at bed time    . metoCLOPramide (REGLAN) 10 MG tablet Take 1 tablet (10 mg total) by mouth 4 (four) times daily as needed (nausea, GI spasm). 60 tablet 0   No current facility-administered medications for this visit.    Allergies  Allergen Reactions  . Bee Pollen Anaphylaxis  . Diazepam     sucidal depression  . Prednisone Nausea And Vomiting    Per pt can not take oral steroids   . Codeine     n & v   . Doxycycline     Pt does not remember reaction  . Erythromycin     n & v  . Morphine     n & v  . Propoxyphene N-Acetaminophen     n & V  . Sulfa Antibiotics     MIGRAINES  . Tetracyclines & Related     N & v  . Other Other (See Comments)  . Propoxyphene Other (See Comments)     Review of Systems: All systems reviewed and negative except where noted in HPI.    Physical Exam: BP 102/70   Pulse 78   Ht 5' 3.5" (1.613 m)   Wt 177 lb 6.4 oz (80.5 kg)   BMI 30.93 kg/m  Constitutional:  Well-developed female in no acute distress. Psychiatric: Pleasant . Normal mood and affect. Behavior is normal. EENT: Pupils normal.  Conjunctivae are normal. No scleral icterus. Neck supple.  Cardiovascular: Normal rate, regular rhythm. No edema Pulmonary/chest: Effort normal and breath sounds normal. No wheezing, rales or rhonchi. Abdominal: Soft, nondistended. Nontender. Bowel sounds active throughout. There are no masses palpable. No hepatomegaly. Lymphadenopathy: No cervical adenopathy noted. Neurological: Alert and oriented to person place and time. Skin: Skin is warm and dry. No rashes noted.   ASSESSMENT AND PLAN:  1. 56 yo female with IBS, chronic pain, GERD / remote fundoplication presenting with nausea, upper  abdominal burning as well as worsening  of her chronic solid food dysphagia. She was seen in 2013 for most of these same symptoms and undersent EGD with empirical dilation . Despite fundoplication she takes BID Zegerid and BID Zantac.  -Trial of carafate -Given progressive solid food dysphagia will schedule her for EGD with possible repeat dilation. The risks and benefits of EGD were discussed and the patient agrees to proceed.    2. Constipaiton. Senna not really helping.  -Trial of Miralax daily  3. Hx of adenomatous colon polyps. No polyps on last colonoscopy in 2013, due for recall in 2023.   Tye Savoy, NP  03/25/2017, 3:35 PM   Agree with Ms. Stefanny Pieri's assessment and plan. Gatha Mayer, MD, [facility]

## 2017-03-25 NOTE — Patient Instructions (Signed)
If you are age 54 or older, your body mass index should be between 23-30. Your Body mass index is 30.93 kg/m. If this is out of the aforementioned range listed, please consider follow up with your Primary Care Provider.  If you are age 10 or younger, your body mass index should be between 19-25. Your Body mass index is 30.93 kg/m. If this is out of the aformentioned range listed, please consider follow up with your Primary Care Provider.   You have been scheduled for an endoscopy. Please follow written instructions given to you at your visit today. If you use inhalers (even only as needed), please bring them with you on the day of your procedure. Your physician has requested that you go to www.startemmi.com and enter the access code given to you at your visit today. This web site gives a general overview about your procedure. However, you should still follow specific instructions given to you by our office regarding your preparation for the procedure.  We have sent the following medications to your pharmacy for you to pick up at your convenience: Carafate Zofran 8 mg  Start Miralax daily.  Thank you for choosing me and [facility].   Tye Savoy, NP

## 2017-03-26 LAB — LIPASE: LIPASE: 22 U/L (ref 11.0–59.0)

## 2017-03-30 ENCOUNTER — Encounter: Payer: Self-pay | Admitting: Internal Medicine

## 2017-04-05 ENCOUNTER — Ambulatory Visit (AMBULATORY_SURGERY_CENTER): Payer: PPO | Admitting: Internal Medicine

## 2017-04-05 VITALS — BP 130/65 | HR 55 | Temp 99.1°F | Resp 15 | Ht 63.0 in | Wt 177.0 lb

## 2017-04-05 DIAGNOSIS — R1013 Epigastric pain: Secondary | ICD-10-CM | POA: Diagnosis not present

## 2017-04-05 DIAGNOSIS — R1310 Dysphagia, unspecified: Secondary | ICD-10-CM | POA: Diagnosis not present

## 2017-04-05 DIAGNOSIS — K219 Gastro-esophageal reflux disease without esophagitis: Secondary | ICD-10-CM | POA: Diagnosis not present

## 2017-04-05 DIAGNOSIS — K317 Polyp of stomach and duodenum: Secondary | ICD-10-CM

## 2017-04-05 DIAGNOSIS — D131 Benign neoplasm of stomach: Secondary | ICD-10-CM

## 2017-04-05 DIAGNOSIS — R1319 Other dysphagia: Secondary | ICD-10-CM

## 2017-04-05 MED ORDER — SODIUM CHLORIDE 0.9 % IV SOLN: 500.0000 mL | INTRAVENOUS | Status: AC

## 2017-04-05 NOTE — Patient Instructions (Addendum)
I dilated the esophagus - I hope that helps.  Let me know if it doesn't - call back - may change medications.  I appreciate the opportunity to care for you. Gatha Mayer, MD, FACG YOU HAD AN ENDOSCOPIC PROCEDURE TODAY AT [facility]:   Refer to the procedure report that was given to you for any specific questions about what was found during the examination.  If the procedure report does not answer your questions, please call your gastroenterologist to clarify.  If you requested that your care partner not be given the details of your procedure findings, then the procedure report has been included in a sealed envelope for you to review at your convenience later.  YOU SHOULD EXPECT: Some feelings of bloating in the abdomen. Passage of more gas than usual.  Walking can help get rid of the air that was put into your GI tract during the procedure and reduce the bloating. If you had a lower endoscopy (such as a colonoscopy or flexible sigmoidoscopy) you may notice spotting of blood in your stool or on the toilet paper. If you underwent a bowel prep for your procedure, you may not have a normal bowel movement for a few days.  Please Note:  You might notice some irritation and congestion in your nose or some drainage.  This is from the oxygen used during your procedure.  There is no need for concern and it should clear up in a day or so.  SYMPTOMS TO REPORT IMMEDIATELY:   Following upper endoscopy (EGD)  Vomiting of blood or coffee ground material  New chest pain or pain under the shoulder blades  Painful or persistently difficult swallowing  New shortness of breath  Fever of 100F or higher  Black, tarry-looking stools  For urgent or emergent issues, a gastroenterologist can be reached at any hour by calling [phone].   DIET:  Post dilation diet - please see handout given to you by your recovery nurse. Clear liquids for 1 hour starting at 2:45pm today, soft diet  for the rest of the day starting at 3:45pm today, but then you may proceed to your regular diet as tolerated tomorrow.  Drink plenty of fluids but you should avoid alcoholic beverages for 24 hours.  MEDICATIONS: Continue present medications  ACTIVITY:  You should plan to take it easy for the rest of today and you should NOT DRIVE or use heavy machinery until tomorrow (because of the sedation medicines used during the test).    FOLLOW UP: Our staff will call the number listed on your records the next business day following your procedure to check on you and address any questions or concerns that you may have regarding the information given to you following your procedure. If we do not reach you, we will leave a message.  However, if you are feeling well and you are not experiencing any problems, there is no need to return our call.  We will assume that you have returned to your regular daily activities without incident.  If any biopsies were taken you will be contacted by phone or by letter within the next 1-3 weeks.  Please call us at [phone] if you have not heard about the biopsies in 3 weeks.   Thank you for allowing [place] to provide for your healthcare needs today.  SIGNATURES/CONFIDENTIALITY: You and/or your care partner have signed paperwork which will be entered into your electronic medical record.  These signatures attest to the  fact that that the information above on your After Visit Summary has been reviewed and is understood.  Full responsibility of the confidentiality of this discharge information lies with you and/or your care-partner.

## 2017-04-05 NOTE — Progress Notes (Signed)
Called to room to assist during endoscopic procedure.  Patient ID and intended procedure confirmed with present staff. Received instructions for my participation in the procedure from the performing physician.  

## 2017-04-05 NOTE — Progress Notes (Signed)
A and O x3. Report to RN. Tolerated MAC anesthesia well.Teeth unchanged after procedure.

## 2017-04-05 NOTE — Progress Notes (Signed)
Kristen Moore PA-S present for procedure 

## 2017-04-05 NOTE — Progress Notes (Signed)
Pt's states no medical or surgical changes since previsit or office visit. 

## 2017-04-05 NOTE — Op Note (Signed)
[facility] Patient Name: Jo [name] Procedure Date: 04/05/2017 1:28 PM MRN: [id number] Endoscopist: Gatha Mayer , MD Age: 56 Referring MD:  Date of Birth: 1961-02-22 Gender: Female Account #: [id number] Procedure:                Upper GI endoscopy Indications:              Epigastric abdominal pain, Dysphagia Medicines:                Propofol per Anesthesia, Monitored Anesthesia Care Procedure:                Pre-Anesthesia Assessment:                           - Prior to the procedure, a History and Physical                            was performed, and patient medications and                            allergies were reviewed. The patient's tolerance of                            previous anesthesia was also reviewed. The risks                            and benefits of the procedure and the sedation                            options and risks were discussed with the patient.                            All questions were answered, and informed consent                            was obtained. Prior Anticoagulants: The patient has                            taken no previous anticoagulant or antiplatelet                            agents. ASA Grade Assessment: II - A patient with                            mild systemic disease. After reviewing the risks                            and benefits, the patient was deemed in                            satisfactory condition to undergo the procedure.                           After obtaining informed consent, the endoscope was  passed under direct vision. Throughout the                            procedure, the patient's blood pressure, pulse, and                            oxygen saturations were monitored continuously. The                            Endoscope was introduced through the mouth, and                            advanced to the second part of duodenum. The upper      GI endoscopy was accomplished without difficulty.                            The patient tolerated the procedure well. Scope In: Scope Out: Findings:                 Evidence of a Nissen fundoplication was found at                            the gastroesophageal junction. The wrap appeared                            intact. This was traversed.                           Multiple small semi-sessile polyps were found in                            the gastric fundus and in the gastric body.                           The exam was otherwise without abnormality.                           The cardia and gastric fundus were normal on                            retroflexion.                           The scope was withdrawn. Dilation was performed in                            the entire esophagus with a Maloney dilator with                            mild resistance at 54 Fr. Estimated blood loss:                            none. Complications:            No immediate complications. Estimated Blood Loss:     Estimated blood loss: none.  Impression:               - A Nissen fundoplication was found. The wrap                            appears intact.                           - Multiple gastric polyps.                           - The examination was otherwise normal.                           - Dilation performed in the entire esophagus.                           - No specimens collected. Recommendation:           - Patient has a contact number available for                            emergencies. The signs and symptoms of potential                            delayed complications were discussed with the                            patient. Return to normal activities tomorrow.                            Written discharge instructions were provided to the                            patient.                           - Clear liquids x 1 hour then soft foods rest of                             day. Start prior diet tomorrow.                           - Continue present medications.                           - she is to let me know if this did not help Gatha Mayer, MD 04/05/2017 1:51:49 PM This report has been signed electronically.

## 2017-04-06 ENCOUNTER — Telehealth: Payer: Self-pay | Admitting: *Deleted

## 2017-04-06 NOTE — Telephone Encounter (Signed)
I see this note will contact this patient later today or tomorrow for these chronic symptoms. Thanks

## 2017-04-06 NOTE — Telephone Encounter (Signed)
   Let pt. Know that Dr. Carlean Purl made aware of her complaints and that he will contact her later today or tomorrow.

## 2017-04-08 NOTE — Telephone Encounter (Signed)
  Spoke with patient and she will try to come tomorrow to pick up samples.

## 2017-04-08 NOTE — Telephone Encounter (Signed)
I sp[oke to her  Plans: 1) Samples FD Donald Prose - take 2 30 mins before meals 2) If that works stay on it 3) If it does not work then try Dexilant (sample) 1 each day before breakfast and stop Zegerid while on it 4) She can call and let me know what works/doesn't - or she can set up My Chart 5) Needs a call when samples are up front

## 2017-04-09 NOTE — Telephone Encounter (Signed)
Chest pain may be related to esophageal spasm or something like that. It just for a few seconds or whether or probably is but I do recommend she discuss with her primary care provider. She may need cardiac evaluation.

## 2017-04-09 NOTE — Telephone Encounter (Signed)
Carlesha called in to ask if she should also stop her generic zantac if she has to take the [place] and Dr Carlean Purl said yes. Lailah informed.  She said she forgot to mention the chest pain she's had off and on.  No arm pain, no nausea/vomiting with this pain. It can " take her breath" at times.

## 2017-04-09 NOTE — Telephone Encounter (Signed)
Patient informed and will call her PCP Dr Melford Aase.

## 2017-05-04 DIAGNOSIS — J45909 Unspecified asthma, uncomplicated: Secondary | ICD-10-CM | POA: Diagnosis not present

## 2017-05-04 DIAGNOSIS — R079 Chest pain, unspecified: Secondary | ICD-10-CM | POA: Diagnosis not present

## 2017-05-04 DIAGNOSIS — E039 Hypothyroidism, unspecified: Secondary | ICD-10-CM | POA: Diagnosis not present

## 2017-05-04 DIAGNOSIS — R5383 Other fatigue: Secondary | ICD-10-CM | POA: Diagnosis not present

## 2017-05-04 DIAGNOSIS — R5381 Other malaise: Secondary | ICD-10-CM | POA: Diagnosis not present

## 2017-05-17 ENCOUNTER — Ambulatory Visit: Payer: PPO | Admitting: Internal Medicine

## 2017-05-26 DIAGNOSIS — M542 Cervicalgia: Secondary | ICD-10-CM | POA: Diagnosis not present

## 2017-05-26 DIAGNOSIS — M5481 Occipital neuralgia: Secondary | ICD-10-CM | POA: Diagnosis not present

## 2017-05-26 DIAGNOSIS — Z79891 Long term (current) use of opiate analgesic: Secondary | ICD-10-CM | POA: Diagnosis not present

## 2017-05-26 DIAGNOSIS — G90512 Complex regional pain syndrome I of left upper limb: Secondary | ICD-10-CM | POA: Diagnosis not present

## 2017-05-26 DIAGNOSIS — G894 Chronic pain syndrome: Secondary | ICD-10-CM | POA: Diagnosis not present

## 2017-05-26 DIAGNOSIS — M791 Myalgia: Secondary | ICD-10-CM | POA: Diagnosis not present

## 2017-08-04 DIAGNOSIS — J0140 Acute pansinusitis, unspecified: Secondary | ICD-10-CM | POA: Diagnosis not present

## 2017-08-04 DIAGNOSIS — J45991 Cough variant asthma: Secondary | ICD-10-CM | POA: Diagnosis not present

## 2017-08-24 DIAGNOSIS — Z79891 Long term (current) use of opiate analgesic: Secondary | ICD-10-CM | POA: Diagnosis not present

## 2017-08-24 DIAGNOSIS — G894 Chronic pain syndrome: Secondary | ICD-10-CM | POA: Diagnosis not present

## 2017-08-24 DIAGNOSIS — M791 Myalgia, unspecified site: Secondary | ICD-10-CM | POA: Diagnosis not present

## 2017-08-24 DIAGNOSIS — M542 Cervicalgia: Secondary | ICD-10-CM | POA: Diagnosis not present

## 2017-08-24 DIAGNOSIS — G90512 Complex regional pain syndrome I of left upper limb: Secondary | ICD-10-CM | POA: Diagnosis not present

## 2017-11-10 ENCOUNTER — Telehealth: Payer: Self-pay | Admitting: Internal Medicine

## 2017-11-10 MED ORDER — METOCLOPRAMIDE HCL 10 MG PO TABS: 10.0000 mg | ORAL_TABLET | Freq: Four times a day (QID) | ORAL | 0 refills | Status: DC | PRN

## 2017-11-10 NOTE — Telephone Encounter (Signed)
Refilled and noted about office visit.

## 2017-11-10 NOTE — Telephone Encounter (Signed)
May I refill this Sir? 

## 2017-11-10 NOTE — Telephone Encounter (Signed)
Refill x 1 rx ok  F/u visit needed if she needs more

## 2017-12-08 DIAGNOSIS — G90512 Complex regional pain syndrome I of left upper limb: Secondary | ICD-10-CM | POA: Diagnosis not present

## 2017-12-08 DIAGNOSIS — G894 Chronic pain syndrome: Secondary | ICD-10-CM | POA: Diagnosis not present

## 2017-12-08 DIAGNOSIS — M791 Myalgia, unspecified site: Secondary | ICD-10-CM | POA: Diagnosis not present

## 2017-12-08 DIAGNOSIS — M542 Cervicalgia: Secondary | ICD-10-CM | POA: Diagnosis not present

## 2017-12-08 DIAGNOSIS — Z79891 Long term (current) use of opiate analgesic: Secondary | ICD-10-CM | POA: Diagnosis not present

## 2017-12-27 DIAGNOSIS — R6883 Chills (without fever): Secondary | ICD-10-CM | POA: Diagnosis not present

## 2017-12-27 DIAGNOSIS — J208 Acute bronchitis due to other specified organisms: Secondary | ICD-10-CM | POA: Diagnosis not present

## 2017-12-27 DIAGNOSIS — B9689 Other specified bacterial agents as the cause of diseases classified elsewhere: Secondary | ICD-10-CM | POA: Diagnosis not present

## 2017-12-29 ENCOUNTER — Ambulatory Visit
Admission: RE | Admit: 2017-12-29 | Discharge: 2017-12-29 | Disposition: A | Discharge status: Home / Self Care | Payer: PPO | Source: Ambulatory Visit | Attending: Nurse Practitioner | Admitting: Nurse Practitioner

## 2017-12-29 ENCOUNTER — Other Ambulatory Visit: Payer: Self-pay | Admitting: Nurse Practitioner

## 2017-12-29 DIAGNOSIS — R05 Cough: Secondary | ICD-10-CM | POA: Diagnosis not present

## 2017-12-29 DIAGNOSIS — J40 Bronchitis, not specified as acute or chronic: Secondary | ICD-10-CM

## 2017-12-29 DIAGNOSIS — J111 Influenza due to unidentified influenza virus with other respiratory manifestations: Secondary | ICD-10-CM | POA: Diagnosis not present

## 2017-12-29 DIAGNOSIS — R509 Fever, unspecified: Secondary | ICD-10-CM | POA: Diagnosis not present

## 2018-01-04 DIAGNOSIS — F1190 Opioid use, unspecified, uncomplicated: Secondary | ICD-10-CM | POA: Diagnosis not present

## 2018-01-04 DIAGNOSIS — Z5181 Encounter for therapeutic drug level monitoring: Secondary | ICD-10-CM | POA: Diagnosis not present

## 2018-01-04 DIAGNOSIS — G894 Chronic pain syndrome: Secondary | ICD-10-CM | POA: Diagnosis not present

## 2018-01-04 DIAGNOSIS — Z79899 Other long term (current) drug therapy: Secondary | ICD-10-CM | POA: Diagnosis not present

## 2018-01-04 DIAGNOSIS — G90512 Complex regional pain syndrome I of left upper limb: Secondary | ICD-10-CM | POA: Diagnosis not present

## 2018-03-01 DIAGNOSIS — G90512 Complex regional pain syndrome I of left upper limb: Secondary | ICD-10-CM | POA: Diagnosis not present

## 2018-03-01 DIAGNOSIS — M791 Myalgia, unspecified site: Secondary | ICD-10-CM | POA: Diagnosis not present

## 2018-03-01 DIAGNOSIS — Z79891 Long term (current) use of opiate analgesic: Secondary | ICD-10-CM | POA: Diagnosis not present

## 2018-03-01 DIAGNOSIS — M542 Cervicalgia: Secondary | ICD-10-CM | POA: Diagnosis not present

## 2018-03-01 DIAGNOSIS — G894 Chronic pain syndrome: Secondary | ICD-10-CM | POA: Diagnosis not present

## 2018-04-27 ENCOUNTER — Encounter: Payer: Self-pay | Admitting: Family Medicine

## 2018-04-27 ENCOUNTER — Ambulatory Visit (INDEPENDENT_AMBULATORY_CARE_PROVIDER_SITE_OTHER): Payer: PPO | Admitting: Family Medicine

## 2018-04-27 VITALS — BP 116/58 | HR 69 | Temp 99.2°F | Ht 64.0 in | Wt 154.9 lb

## 2018-04-27 DIAGNOSIS — E559 Vitamin D deficiency, unspecified: Secondary | ICD-10-CM | POA: Diagnosis not present

## 2018-04-27 DIAGNOSIS — J029 Acute pharyngitis, unspecified: Secondary | ICD-10-CM | POA: Diagnosis not present

## 2018-04-27 DIAGNOSIS — R05 Cough: Secondary | ICD-10-CM | POA: Diagnosis not present

## 2018-04-27 DIAGNOSIS — R059 Cough, unspecified: Secondary | ICD-10-CM

## 2018-04-27 DIAGNOSIS — Z13220 Encounter for screening for lipoid disorders: Secondary | ICD-10-CM

## 2018-04-27 DIAGNOSIS — R5383 Other fatigue: Secondary | ICD-10-CM | POA: Diagnosis not present

## 2018-04-27 DIAGNOSIS — Z1231 Encounter for screening mammogram for malignant neoplasm of breast: Secondary | ICD-10-CM

## 2018-04-27 DIAGNOSIS — E039 Hypothyroidism, unspecified: Secondary | ICD-10-CM | POA: Diagnosis not present

## 2018-04-27 DIAGNOSIS — E539 Vitamin B deficiency, unspecified: Secondary | ICD-10-CM

## 2018-04-27 DIAGNOSIS — G9050 Complex regional pain syndrome I, unspecified: Secondary | ICD-10-CM | POA: Diagnosis not present

## 2018-04-27 DIAGNOSIS — Z79899 Other long term (current) drug therapy: Secondary | ICD-10-CM | POA: Diagnosis not present

## 2018-04-27 DIAGNOSIS — R12 Heartburn: Secondary | ICD-10-CM

## 2018-04-27 DIAGNOSIS — Z1239 Encounter for other screening for malignant neoplasm of breast: Secondary | ICD-10-CM

## 2018-04-27 LAB — POCT RAPID STREP A (OFFICE): Rapid Strep A Screen: NEGATIVE

## 2018-04-27 LAB — POCT MONO (EPSTEIN BARR VIRUS): Mono, POC: NEGATIVE
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
## 2018-04-27 NOTE — Patient Instructions (Signed)
I will let you know when we get lab results and determine follow up at that time.

## 2018-04-28 LAB — CBC WITH DIFFERENTIAL/PLATELET
BASOS ABS: 0.1 10*3/uL (ref 0.0–0.1)
BASOS PCT: 1.1 % (ref 0.0–3.0)
EOS PCT: 3.1 % (ref 0.0–5.0)
Eosinophils Absolute: 0.2 10*3/uL (ref 0.0–0.7)
HCT: 38.8 % (ref 36.0–46.0)
Hemoglobin: 12.8 g/dL (ref 12.0–15.0)
Lymphocytes Relative: 33.1 % (ref 12.0–46.0)
Lymphs Abs: 2 10*3/uL (ref 0.7–4.0)
MCHC: 33.1 g/dL (ref 30.0–36.0)
MCV: 89.2 fl (ref 78.0–100.0)
MONOS PCT: 6.7 % (ref 3.0–12.0)
Monocytes Absolute: 0.4 10*3/uL (ref 0.1–1.0)
NEUTROS ABS: 3.4 10*3/uL (ref 1.4–7.7)
Neutrophils Relative %: 56 % (ref 43.0–77.0)
PLATELETS: 293 10*3/uL (ref 150.0–400.0)
RBC: 4.35 Mil/uL (ref 3.87–5.11)
RDW: 13.4 % (ref 11.5–15.5)
WBC: 6 10*3/uL (ref 4.0–10.5)

## 2018-04-28 LAB — COMPREHENSIVE METABOLIC PANEL
ALT: 5 U/L (ref 0–35)
AST: 15 U/L (ref 0–37)
Albumin: 4.4 g/dL (ref 3.5–5.2)
Alkaline Phosphatase: 58 U/L (ref 39–117)
BILIRUBIN TOTAL: 0.3 mg/dL (ref 0.2–1.2)
BUN: 10 mg/dL (ref 6–23)
CO2: 25 meq/L (ref 19–32)
Calcium: 9.3 mg/dL (ref 8.4–10.5)
Chloride: 106 mEq/L (ref 96–112)
Creatinine, Ser: 0.89 mg/dL (ref 0.40–1.20)
GFR: 70.07 mL/min (ref >60.00)
GLUCOSE: 74 mg/dL (ref 70–99)
Potassium: 4.6 mEq/L (ref 3.5–5.1)
SODIUM: 140 meq/L (ref 135–145)
TOTAL PROTEIN: 7 g/dL (ref 6.0–8.3)

## 2018-04-28 LAB — LIPID PANEL
CHOL/HDL RATIO: 2
Cholesterol: 157 mg/dL (ref 0–200)
HDL: 73.6 mg/dL (ref >39.00)
LDL Cholesterol: 58 mg/dL (ref 0–99)
NONHDL: 83.06
Triglycerides: 125 mg/dL (ref 0.0–149.0)
VLDL: 25 mg/dL (ref 0.0–40.0)

## 2018-04-28 LAB — VITAMIN D 25 HYDROXY (VIT D DEFICIENCY, FRACTURES): VITD: 83.61 ng/mL (ref 30.00–100.00)

## 2018-04-28 LAB — TSH: TSH: 0.39 u[IU]/mL (ref 0.35–4.50)

## 2018-04-28 LAB — VITAMIN B12: VITAMIN B 12: 199 pg/mL — AB (ref 211–911)

## 2018-05-04 ENCOUNTER — Ambulatory Visit (INDEPENDENT_AMBULATORY_CARE_PROVIDER_SITE_OTHER): Payer: PPO | Admitting: Family Medicine

## 2018-05-04 DIAGNOSIS — E538 Deficiency of other specified B group vitamins: Secondary | ICD-10-CM | POA: Diagnosis not present

## 2018-05-04 MED ORDER — CYANOCOBALAMIN 1000 MCG/ML IJ SOLN
1000.0000 ug | Freq: Once | INTRAMUSCULAR | Status: AC
  Administered 2018-05-04: 1000 ug via INTRAMUSCULAR

## 2018-05-04 NOTE — Progress Notes (Signed)
Per orders of Dr. Ethlyn Gallery, injection of Vitamin B 12 given by Aggie Hacker ANN. Patient tolerated injection well.  Patient prefers upper outer quadrant as injection site, does not do well with injections in the deltoid of either arm.

## 2018-05-11 ENCOUNTER — Ambulatory Visit (INDEPENDENT_AMBULATORY_CARE_PROVIDER_SITE_OTHER): Payer: PPO | Admitting: *Deleted

## 2018-05-11 DIAGNOSIS — E538 Deficiency of other specified B group vitamins: Secondary | ICD-10-CM | POA: Diagnosis not present

## 2018-05-11 MED ORDER — CYANOCOBALAMIN 1000 MCG/ML IJ SOLN
1000.0000 ug | Freq: Once | INTRAMUSCULAR | Status: AC
  Administered 2018-05-11: 1000 ug via INTRAMUSCULAR

## 2018-05-11 NOTE — Progress Notes (Addendum)
Per orders of Dr. Ethlyn Gallery, injection of B12 given by Dorrene German. Patient tolerated injection well.

## 2018-05-18 ENCOUNTER — Ambulatory Visit (INDEPENDENT_AMBULATORY_CARE_PROVIDER_SITE_OTHER): Payer: PPO | Admitting: Family Medicine

## 2018-05-18 ENCOUNTER — Encounter: Payer: Self-pay | Admitting: Family Medicine

## 2018-05-18 ENCOUNTER — Ambulatory Visit: Payer: PPO

## 2018-05-18 VITALS — BP 122/70 | HR 81 | Temp 96.0°F | Wt 154.0 lb

## 2018-05-18 DIAGNOSIS — L309 Dermatitis, unspecified: Secondary | ICD-10-CM | POA: Diagnosis not present

## 2018-05-18 DIAGNOSIS — Z1231 Encounter for screening mammogram for malignant neoplasm of breast: Secondary | ICD-10-CM

## 2018-05-18 DIAGNOSIS — E538 Deficiency of other specified B group vitamins: Secondary | ICD-10-CM

## 2018-05-18 DIAGNOSIS — R5383 Other fatigue: Secondary | ICD-10-CM | POA: Diagnosis not present

## 2018-05-18 DIAGNOSIS — Z1239 Encounter for other screening for malignant neoplasm of breast: Secondary | ICD-10-CM

## 2018-05-18 MED ORDER — CYANOCOBALAMIN 1000 MCG/ML IJ SOLN
1000.0000 ug | Freq: Once | INTRAMUSCULAR | Status: AC
  Administered 2018-05-18: 1000 ug via INTRAMUSCULAR

## 2018-05-18 MED ORDER — ACYCLOVIR 5 % EX OINT: 1.0000 "application " | TOPICAL_OINTMENT | CUTANEOUS | 2 refills | Status: DC

## 2018-05-18 NOTE — Progress Notes (Signed)
Jo [name] DOB: 1961-07-01 Encounter date: 05/18/2018  This is a 57 y.o. female who presents with Chief Complaint  Patient presents with  . Rash    righ side of the mouth, reoccuring, pt states"it seems to break out when i don't eat meat" , pt states the rash is sore, used baking soda paste    History of present illness:  Hasn't noted improvement with B12, is here for 3rd today.   She has her asthma "cough" that is typical for her today.   Got rash on right side of mouth. Not sure trigger but seems to improve when she eats meat. Does note that it improves with meat and when diet is worse it seems to get worse. Is a poor eater. Has a hard time taking vitamins. Has a really hard time with stomach issues. Hard to tolerate medications. Has difficulty with veggies,some fruits. Very gassy, esp since fundoplication. Rash has improved and is less painful currently than it was a couple of days ago when started. She has had similar rash on left before, but not currently.  Baking soda paste has seemed to help dry this out.   Diet is poor. Eats a lot of salads because they are easy. Sunflower seeds, romaine/ice burg, dried toppings (onions), carrots, dressing. Frequently skipping meals due to fatigue/pain. This is a chronic issue for her but has progressed over time.     Allergies  Allergen Reactions  . Bee Pollen Anaphylaxis  . Diazepam     sucidal depression  . Prednisone Nausea And Vomiting    Per pt can not take oral steroids   . Amoxicillin     Severe diarrhea   . Doxycycline     Pt does not remember reaction  . Erythromycin     n & v  . Morphine     n & v  . Propoxyphene N-Acetaminophen     n & V  . Sulfa Antibiotics     MIGRAINES  . Tetracyclines & Related     N & v  . Propoxyphene Other (See Comments)   Current Meds  Medication Sig  . busPIRone (BUSPAR) 15 MG tablet Take 1/2 tablet twice a day  . cholecalciferol (VITAMIN D) 1000 UNITS tablet Take 2,000 Units by mouth  daily.  . Cranberry 200 MG CAPS Take 200 mg by mouth 2 (two) times daily.  Marland Kitchen ESTRADIOL ACETATE PO Take 2 mg by mouth daily. 2 mg tablet  . fexofenadine-pseudoephedrine (ALLEGRA-D 24) 180-240 MG 24 hr tablet Take 1 tablet by mouth at bedtime.  Marland Kitchen HYDROmorphone (DILAUDID) 4 MG tablet Take 4 mg by mouth every 4 (four) hours as needed.  . lamoTRIgine (LAMICTAL) 100 MG tablet Take 100 mg by mouth 2 (two) times daily.   Marland Kitchen levothyroxine (SYNTHROID, LEVOTHROID) 125 MCG tablet Take 125 mcg by mouth daily before breakfast.  . medroxyPROGESTERone (PROVERA) 2.5 MG tablet Take 2.5 mg by mouth. 1 tab every other day  . metFORMIN (GLUCOPHAGE) 500 MG tablet Take 1,000 mg by mouth 2 (two) times daily with a meal. Take 2 tabs q am, 1 tab qhs.  . methadone (DOLOPHINE) 10 MG tablet Take 20 mg by mouth every 5 (five) hours as needed for severe pain. Reported on 11/15/2015; takes 2am, 2lunch, 1pm  . montelukast (SINGULAIR) 10 MG tablet Take 10 mg by mouth at bedtime.  Earney Navy Bicarbonate (ZEGERID PO) Take 1 capsule by mouth daily.   . ondansetron (ZOFRAN) 4 MG tablet Take 1 tablet (4  mg total) by mouth every 8 (eight) hours as needed for nausea.  . ranitidine (ZANTAC) 150 MG tablet Take 150 mg by mouth 2 (two) times daily.  . sertraline (ZOLOFT) 100 MG tablet Take 200 mg by mouth daily.  Marland Kitchen tiZANidine (ZANAFLEX) 4 MG capsule Take 4 mg by mouth 3 (three) times daily.  Marland Kitchen topiramate (TOPAMAX) 50 MG tablet Take 25 mg by mouth 4 (four) times daily. 1 tab every morning and 3 tab at bed time   Current Facility-Administered Medications for the 05/18/18 encounter (Office Visit) with Caren Macadam, MD  Medication  . cyanocobalamin ((VITAMIN B-12)) injection 1,000 mcg    Review of Systems  Constitutional: Positive for fatigue. Negative for chills and fever.  HENT: Positive for congestion (some persisting congestion with nasal drainage) and sinus pressure (slight left). Negative for sinus pain, sore throat and  trouble swallowing.   Respiratory: Positive for cough (chronic'). Negative for shortness of breath and wheezing.   Cardiovascular: Negative for chest pain.  Gastrointestinal: Negative for abdominal pain.       Frequent upset stomachs. Has difficulty tolerating many foods/meds due to stomach issues.  Musculoskeletal: Positive for arthralgias, back pain and myalgias.    Objective:  BP 122/70 (BP Location: Right Arm, Patient Position: Sitting, Cuff Size: Normal)   Pulse 81   Temp (!) 96 F (35.6 C) (Oral)   Wt 154 lb (69.9 kg)   SpO2 96%   BMI 26.43 kg/m   Weight: 154 lb (69.9 kg)   BP Readings from Last 3 Encounters:  05/18/18 122/70  04/27/18 (!) 116/58  04/05/17 130/65   Wt Readings from Last 3 Encounters:  05/18/18 154 lb (69.9 kg)  04/27/18 154 lb 14.4 oz (70.3 kg)  04/05/17 177 lb (80.3 kg)    Physical Exam  Constitutional: She appears well-developed and well-nourished. No distress.  HENT:  Mouth/Throat: Oropharynx is clear and moist. No oropharyngeal exudate.  Cardiovascular: Normal rate, regular rhythm and normal heart sounds.  Pulmonary/Chest: Effort normal and breath sounds normal.  Skin:  Resolving vesicular rash right lower face. Light erythema remaining.     Assessment/Plan 1. Dermatitis Really appears to be a resolving herpes-type dermatitis. Since improving will just print rx for zovirax in case of recurrence. Her poor dietary intake raises concern for rash near mouth being related to vitamin deficiency although current appearance does not seem to correlate. She has complained of angular stomatitis that comes and goes however, so I am going to look into some vitamin options for her that will hopefully incorporate some of what her diet is lacking and she is very willing to re-try taking oral vitamin. We discussed meal prep when having energy; food saving/freezing for ease of nutritious meal, and quick meals (like carnation instant breakfast) rather than skipping  eating entirely. If unable to tolerate multivtamin I would consider vitamin testing in future.  2. Vitamin B 12 deficiency Continue with injections; concerns for oral absorption due to hx of fundoplication and ulcers. - cyanocobalamin ((VITAMIN B-12)) injection 1,000 mcg  3. Screening for breast cancer - MM 3D SCREEN BREAST BILATERAL; Future  4. Fatigue, unspecified type See above  Return for please call regarding nutrition referral/vitamin tolerance and I will let you know about followup.      Micheline Rough, MD

## 2018-05-18 NOTE — Patient Instructions (Addendum)
Consider children's gummy/vitamin and take twice daily.   Consider some supplementation for diet when you aren't hungry - I.e. Magic cups for ice cream treat, carnation instant breakfast, etc.   Check with insurance if nutritionist is covered; if supplements (like boost) are covered.

## 2018-05-20 ENCOUNTER — Telehealth: Payer: Self-pay

## 2018-05-20 ENCOUNTER — Other Ambulatory Visit: Payer: Self-pay | Admitting: Family Medicine

## 2018-05-20 DIAGNOSIS — R21 Rash and other nonspecific skin eruption: Secondary | ICD-10-CM

## 2018-05-20 NOTE — Telephone Encounter (Signed)
Spreading to the chin, "has a big round circle around it that's swollen." one one the bottom of the chin and one on the right side of the mouth. has spread to the right side as well. No blisters, raised areas, size of a dime. Still has soreness. Possible area under the right eye beside the nose.

## 2018-05-20 NOTE — Telephone Encounter (Signed)
Jo Macadam, MD  [name], [name]        I looked into a couple adult gummy vitamins for her. Vitafusion seems like brand that has a little more bang for the buck so to speak. I would like for her to have something with folic acid, thiamine, niacin,B5, B6, Biotin. I can test for deficiency but I think her trying vitamin first would be worth a shot. She was looking into nutritionist coverage and supplement coverage so let me know if she has anything for me to do on that end (orders/referral).

## 2018-05-20 NOTE — Telephone Encounter (Signed)
I feel bad doing this but I just feel like it has changed so much since we saw it that I'm not certain about what to treat with at this point. I would feel much better if she was able to get it evaluated so we know for sure what we are dealing with. She has a lot of medication sensitivities so I don't want to put something into her system if not necessary. I would recommend she do one of our after hours locations. I'm sorry to make her go out again, but it just sounds like it needs evaluation.

## 2018-05-20 NOTE — Telephone Encounter (Signed)
Spoke with the patient, she stated that she had a nurse visit for 05/25/18 for her B12 shot. We decided to change that to a follow up with Dr. Ethlyn Gallery at 12:00 and do her B12 shot then. She has requested that a referral for Dermatology be placed, Dr. Ethlyn Gallery has given the ok for the referral.   Referral has been placed.

## 2018-05-20 NOTE — Telephone Encounter (Signed)
Noted  

## 2018-05-20 NOTE — Telephone Encounter (Signed)
Patient has been given the recommendations per Dr. Ethlyn Gallery. Patient states that her rash has spread significantly and that the acyclovir was too expensive.   Please advise, is there and alternative?  No information on nutritionist coverage yet.

## 2018-05-20 NOTE — Telephone Encounter (Signed)
Can you find out if rash looks/feels different? Are there blisters at present? How is it worse? Still only on right side? Any other new symptoms? It was resolving when she saw me so just want to clarify so I can send something in.

## 2018-05-25 ENCOUNTER — Ambulatory Visit: Payer: PPO

## 2018-05-25 ENCOUNTER — Ambulatory Visit (INDEPENDENT_AMBULATORY_CARE_PROVIDER_SITE_OTHER): Payer: PPO | Admitting: Family Medicine

## 2018-05-25 ENCOUNTER — Encounter: Payer: Self-pay | Admitting: Family Medicine

## 2018-05-25 VITALS — BP 118/64 | HR 78 | Temp 98.9°F | Wt 153.7 lb

## 2018-05-25 DIAGNOSIS — L710 Perioral dermatitis: Secondary | ICD-10-CM

## 2018-05-25 DIAGNOSIS — E538 Deficiency of other specified B group vitamins: Secondary | ICD-10-CM | POA: Diagnosis not present

## 2018-05-25 MED ORDER — NYSTATIN 100000 UNIT/GM EX CREA: 1.0000 "application " | TOPICAL_CREAM | Freq: Two times a day (BID) | CUTANEOUS | 1 refills | Status: DC

## 2018-05-25 MED ORDER — CYANOCOBALAMIN 1000 MCG/ML IJ SOLN
1000.0000 ug | INTRAMUSCULAR | Status: DC
  Administered 2018-05-25: 1000 ug via INTRAMUSCULAR

## 2018-05-25 MED ORDER — PIMECROLIMUS 1 % EX CREA: TOPICAL_CREAM | Freq: Two times a day (BID) | CUTANEOUS | 1 refills | Status: DC

## 2018-05-25 MED ORDER — ERYTHROMYCIN 2 % EX GEL: Freq: Two times a day (BID) | CUTANEOUS | 1 refills | Status: DC

## 2018-05-25 NOTE — Progress Notes (Signed)
Jo [name] DOB: [birth year] Encounter date: 05/25/2018  This is a 57 y.o. female who presents with Chief Complaint  Patient presents with  . Rash    spread across the chin, possible spots on the cheeks, using nystatin,     History of present illness:  Rash became worse a couple of days after her last appointment.    Rash has been coming and going for months. Was really bad one other time (similar to this). When it was bad before she thought that it was nutrition related (she wasn't eating well). Hasn't been eating well since last visit. Had social stressors with friend. Hasn't worsened in last few days.   Putting nystatin and benadryl cream on skin. Sometimes looks like it is drying out, but then next day looks worse. Had one larger lesion left lower chin that looked like bug bite. Sometimes face gets red, hot, then bumps. Can be unilateral. Doesn't seem to be sun sensitive. Tries to bring cool cloth with her to soothe it. Burning sensation. No drainage, no blisters.   No fevers; hasn't felt feverish although she doesn't regulate temperature very well. Not sure if related to her chronic pain condition.       Allergies  Allergen Reactions  . Bee Pollen Anaphylaxis  . Diazepam     sucidal depression  . Prednisone Nausea And Vomiting    Per pt can not take oral steroids   . Amoxicillin     Severe diarrhea   . Doxycycline     Pt does not remember reaction  . Erythromycin     n & v  . Morphine     n & v  . Propoxyphene N-Acetaminophen     n & V  . Sulfa Antibiotics     MIGRAINES  . Tetracyclines & Related     N & v  . Propoxyphene Other (See Comments)   Current Meds  Medication Sig  . busPIRone (BUSPAR) 15 MG tablet Take 1/2 tablet twice a day  . cholecalciferol (VITAMIN D) 1000 UNITS tablet Take 2,000 Units by mouth daily.  . Cranberry 200 MG CAPS Take 200 mg by mouth 2 (two) times daily.  Marland Kitchen ESTRADIOL ACETATE PO Take 2 mg by mouth daily. 2 mg tablet  .  fexofenadine-pseudoephedrine (ALLEGRA-D 24) 180-240 MG 24 hr tablet Take 1 tablet by mouth at bedtime.  Marland Kitchen HYDROmorphone (DILAUDID) 4 MG tablet Take 4 mg by mouth every 4 (four) hours as needed.  . lamoTRIgine (LAMICTAL) 100 MG tablet Take 100 mg by mouth 2 (two) times daily.   Marland Kitchen levothyroxine (SYNTHROID, LEVOTHROID) 125 MCG tablet Take 125 mcg by mouth daily before breakfast.  . medroxyPROGESTERone (PROVERA) 2.5 MG tablet Take 2.5 mg by mouth. 1 tab every other day  . metFORMIN (GLUCOPHAGE) 500 MG tablet Take 1,000 mg by mouth 2 (two) times daily with a meal. Take 2 tabs q am, 1 tab qhs.  . methadone (DOLOPHINE) 10 MG tablet Take 20 mg by mouth every 5 (five) hours as needed for severe pain. Reported on 11/15/2015; takes 2am, 2lunch, 1pm  . montelukast (SINGULAIR) 10 MG tablet Take 10 mg by mouth at bedtime.  Earney Navy Bicarbonate (ZEGERID PO) Take 1 capsule by mouth daily.   . ondansetron (ZOFRAN) 4 MG tablet Take 1 tablet (4 mg total) by mouth every 8 (eight) hours as needed for nausea.  . ranitidine (ZANTAC) 150 MG tablet Take 150 mg by mouth 2 (two) times daily.  . sertraline (ZOLOFT) 100  MG tablet Take 200 mg by mouth daily.  Marland Kitchen tiZANidine (ZANAFLEX) 4 MG capsule Take 4 mg by mouth 3 (three) times daily.  Marland Kitchen topiramate (TOPAMAX) 50 MG tablet Take 25 mg by mouth 4 (four) times daily. 1 tab every morning and 3 tab at bed time    Review of Systems  Skin: Positive for color change and rash.       Skin rash on face, chin. Not present anywhere else on body.    Objective:  BP 118/64 (BP Location: Right Arm, Patient Position: Sitting, Cuff Size: Normal)   Pulse 78   Temp 98.9 F (37.2 C) (Oral)   Wt 153 lb 11.2 oz (69.7 kg)   SpO2 96%   BMI 26.38 kg/m   Weight: 153 lb 11.2 oz (69.7 kg)   BP Readings from Last 3 Encounters:  05/25/18 118/64  05/18/18 122/70  04/27/18 (!) 116/58   Wt Readings from Last 3 Encounters:  05/25/18 153 lb 11.2 oz (69.7 kg)  05/18/18 154 lb (69.9  kg)  04/27/18 154 lb 14.4 oz (70.3 kg)    Physical Exam  Constitutional: She appears well-developed and well-nourished.  Pulmonary/Chest: Effort normal.  Skin:  There is a lightly erythematous papular rash right perioral, extending to inferior perioral. There is a resolving ?insect bite left lower chin with central punctate. Overall dermatitis is less erythematous and inflammed than home pictures from a couple of days ago. She has some erythematous papules on left side of face that come up as she is waiting in warm room (not there initially).   Psychiatric: She has a normal mood and affect. Her behavior is normal.  She is tearful when talking about difficulties with relation with her friend.    Assessment/Plan 1. Perioral dermatitis She has had some improvement with nystatin so we can continue that. Add on erythromycin or, if affordable, will get pimecrolimus.  - erythromycin with ethanol (EMGEL) 2 % gel; Apply topically 2 (two) times daily.  Dispense: 30 g; Refill: 1 - nystatin cream (MYCOSTATIN); Apply 1 application topically 2 (two) times daily.  Dispense: 30 g; Refill: 1  2. Vitamin B 12 deficiency Completed in office today; will start monthly injections following.  - cyanocobalamin ((VITAMIN B-12)) injection 1,000 mcg    She is going to get adult multivitamin to try today. She will let me know about status of rash and how she tolerates vitamin in a couple of weeks (or if doing well at next visit for B12 injection)   Micheline Rough, MD

## 2018-05-25 NOTE — Patient Instructions (Signed)
Use the pimecrolimus as directed if affordable.   Otherwise try the erythromycin topical followed by the nystatin. Keep face clean with gentle cleanser.   Keep me updated on appearance of rash and how you are doing with vitamins.

## 2018-05-31 DIAGNOSIS — G90512 Complex regional pain syndrome I of left upper limb: Secondary | ICD-10-CM | POA: Diagnosis not present

## 2018-05-31 DIAGNOSIS — G894 Chronic pain syndrome: Secondary | ICD-10-CM | POA: Diagnosis not present

## 2018-05-31 DIAGNOSIS — Z79891 Long term (current) use of opiate analgesic: Secondary | ICD-10-CM | POA: Diagnosis not present

## 2018-05-31 DIAGNOSIS — M542 Cervicalgia: Secondary | ICD-10-CM | POA: Diagnosis not present

## 2018-05-31 DIAGNOSIS — M791 Myalgia, unspecified site: Secondary | ICD-10-CM | POA: Diagnosis not present

## 2018-06-02 DIAGNOSIS — L308 Other specified dermatitis: Secondary | ICD-10-CM | POA: Diagnosis not present

## 2018-06-02 DIAGNOSIS — D225 Melanocytic nevi of trunk: Secondary | ICD-10-CM | POA: Diagnosis not present

## 2018-06-02 DIAGNOSIS — L821 Other seborrheic keratosis: Secondary | ICD-10-CM | POA: Diagnosis not present

## 2018-06-02 DIAGNOSIS — L710 Perioral dermatitis: Secondary | ICD-10-CM | POA: Diagnosis not present

## 2018-06-15 ENCOUNTER — Ambulatory Visit
Admission: RE | Admit: 2018-06-15 | Discharge: 2018-06-15 | Disposition: A | Discharge status: Home / Self Care | Payer: PPO | Source: Ambulatory Visit | Attending: Family Medicine | Admitting: Family Medicine

## 2018-06-15 ENCOUNTER — Ambulatory Visit (INDEPENDENT_AMBULATORY_CARE_PROVIDER_SITE_OTHER): Payer: PPO | Admitting: *Deleted

## 2018-06-15 DIAGNOSIS — Z23 Encounter for immunization: Secondary | ICD-10-CM | POA: Diagnosis not present

## 2018-06-15 DIAGNOSIS — Z1231 Encounter for screening mammogram for malignant neoplasm of breast: Secondary | ICD-10-CM | POA: Diagnosis not present

## 2018-06-15 DIAGNOSIS — Z1239 Encounter for other screening for malignant neoplasm of breast: Secondary | ICD-10-CM

## 2018-06-15 NOTE — Progress Notes (Signed)
Per orders of Ferdie Ping, injection of influenza  given by Westley Hummer. Patient tolerated injection well.

## 2018-06-29 ENCOUNTER — Ambulatory Visit: Payer: PPO

## 2018-07-22 ENCOUNTER — Telehealth: Payer: Self-pay | Admitting: Family Medicine

## 2018-07-22 MED ORDER — AZELASTINE HCL 0.1 % NA SOLN: 1.0000 | Freq: Two times a day (BID) | NASAL | 2 refills | Status: DC

## 2018-07-22 MED ORDER — FLUTICASONE PROPIONATE 50 MCG/ACT NA SUSP: 2.0000 | Freq: Every day | NASAL | 2 refills | Status: DC

## 2018-07-22 NOTE — Telephone Encounter (Signed)
Copied from [place] [id number]. Topic: Quick Communication - Rx Refill/Question >> Jul 22, 2018  1:11 PM Sheran Luz wrote: Medication: astelin nasal spray and flonase   Pt called requesting a refill of these medications. Pt stated she provided a list of her medications at office visit and these medications were supposed to be listed. Please advise.   Preferred Pharmacy (with phone number or street name): [id number] - [place], [place]. AT [address]  [phone] (Phone) [phone] (Fax)

## 2018-07-22 NOTE — Telephone Encounter (Signed)
Patient is requesting allergy medications that are not on medication list.  Astelin Flonase  LOV: 05/25/18   PCP: [facility]: verified

## 2018-07-22 NOTE — Telephone Encounter (Signed)
Okay to refill? 

## 2018-07-22 NOTE — Telephone Encounter (Signed)
ok 

## 2018-07-25 MED ORDER — FLUTICASONE PROPIONATE 50 MCG/ACT NA SUSP: 2.0000 | Freq: Every day | NASAL | 2 refills | Status: DC

## 2018-07-25 MED ORDER — AZELASTINE HCL 0.1 % NA SOLN: 1.0000 | Freq: Two times a day (BID) | NASAL | 2 refills | Status: DC

## 2018-07-25 NOTE — Addendum Note (Signed)
Addended by: Westley Hummer B on: 07/25/2018 03:11 PM   Modules accepted: Orders

## 2018-07-25 NOTE — Telephone Encounter (Signed)
Rx sent to pharmacy   

## 2018-08-02 ENCOUNTER — Ambulatory Visit (INDEPENDENT_AMBULATORY_CARE_PROVIDER_SITE_OTHER): Payer: PPO

## 2018-08-02 DIAGNOSIS — E538 Deficiency of other specified B group vitamins: Secondary | ICD-10-CM | POA: Diagnosis not present

## 2018-08-02 MED ORDER — CYANOCOBALAMIN 1000 MCG/ML IJ SOLN
1000.0000 ug | Freq: Once | INTRAMUSCULAR | Status: AC
  Administered 2018-08-02: 1000 ug via INTRAMUSCULAR

## 2018-08-02 NOTE — Progress Notes (Signed)
Per orders of Dr. Ethlyn Gallery, injection of Cyanocobalamin 1000 mL given by Wyvonne Lenz. Patient tolerated injection well.

## 2018-08-04 ENCOUNTER — Telehealth: Payer: Self-pay

## 2018-08-04 DIAGNOSIS — E538 Deficiency of other specified B group vitamins: Secondary | ICD-10-CM

## 2018-08-04 NOTE — Telephone Encounter (Signed)
Patient came into the office stating she has had all of the B-12 injections and would like to schedule her labs.  Patient has had a total of 4 injections over 3 months.  Instructions given to patient were 1 injection once a week for 4 weeks then 1 injection once a month. Reck in 3 months.  Ok to have labs now? Does patient need a Office visit or lab appointment?

## 2018-08-04 NOTE — Telephone Encounter (Signed)
Left a detailed message on verified voice mail asking patient to schedule lab appointment to check B12.

## 2018-08-04 NOTE — Telephone Encounter (Signed)
She didn't get as many as I would like but ok to recheck B12 now and see where level is. No office visit needed, just B12 and folate under dx of B12 deficiency would be fine. I'll call her with results. No fasting needed.

## 2018-08-05 NOTE — Telephone Encounter (Signed)
Patient stated she has her last B12 injection on December and she will make a lab appointment after that. Labs have already been ordered.

## 2018-08-10 DIAGNOSIS — G894 Chronic pain syndrome: Secondary | ICD-10-CM | POA: Diagnosis not present

## 2018-08-10 DIAGNOSIS — Z79891 Long term (current) use of opiate analgesic: Secondary | ICD-10-CM | POA: Diagnosis not present

## 2018-08-10 DIAGNOSIS — G90512 Complex regional pain syndrome I of left upper limb: Secondary | ICD-10-CM | POA: Diagnosis not present

## 2018-08-10 DIAGNOSIS — M791 Myalgia, unspecified site: Secondary | ICD-10-CM | POA: Diagnosis not present

## 2018-08-10 DIAGNOSIS — M542 Cervicalgia: Secondary | ICD-10-CM | POA: Diagnosis not present

## 2018-08-31 ENCOUNTER — Ambulatory Visit: Payer: PPO

## 2018-09-01 ENCOUNTER — Ambulatory Visit (INDEPENDENT_AMBULATORY_CARE_PROVIDER_SITE_OTHER): Payer: PPO

## 2018-09-01 DIAGNOSIS — E538 Deficiency of other specified B group vitamins: Secondary | ICD-10-CM

## 2018-09-01 MED ORDER — CYANOCOBALAMIN 1000 MCG/ML IJ SOLN
1000.0000 ug | Freq: Once | INTRAMUSCULAR | Status: AC
  Administered 2018-09-01: 1000 ug via INTRAMUSCULAR

## 2018-09-01 NOTE — Progress Notes (Signed)
Per orders of Dr. Ethlyn Gallery, injection of B12 given by [name]. Patient tolerated injection well.

## 2018-09-22 ENCOUNTER — Other Ambulatory Visit: Payer: Self-pay

## 2018-09-22 NOTE — Telephone Encounter (Signed)
Sertraline historical medication. Never filled by Dr.Koberlein. Ok to fill?

## 2018-09-25 MED ORDER — SERTRALINE HCL 100 MG PO TABS: 200.0000 mg | ORAL_TABLET | Freq: Every day | ORAL | 1 refills | Status: DC

## 2018-09-27 ENCOUNTER — Other Ambulatory Visit (INDEPENDENT_AMBULATORY_CARE_PROVIDER_SITE_OTHER): Payer: PPO

## 2018-09-27 DIAGNOSIS — E538 Deficiency of other specified B group vitamins: Secondary | ICD-10-CM | POA: Diagnosis not present

## 2018-09-27 LAB — B12 AND FOLATE PANEL
FOLATE: 17.6 ng/mL (ref >5.9)
VITAMIN B 12: 500 pg/mL (ref 211–911)

## 2018-10-03 ENCOUNTER — Ambulatory Visit: Payer: PPO | Admitting: Family Medicine

## 2018-10-04 ENCOUNTER — Ambulatory Visit (INDEPENDENT_AMBULATORY_CARE_PROVIDER_SITE_OTHER): Payer: PPO

## 2018-10-04 DIAGNOSIS — E538 Deficiency of other specified B group vitamins: Secondary | ICD-10-CM | POA: Diagnosis not present

## 2018-10-04 MED ORDER — CYANOCOBALAMIN 1000 MCG/ML IJ SOLN
1000.0000 ug | Freq: Once | INTRAMUSCULAR | Status: AC
  Administered 2018-10-04: 1000 ug via INTRAMUSCULAR

## 2018-10-04 NOTE — Progress Notes (Signed)
Per orders of Dr. Ethlyn Gallery, injection of B12 given by [name]. Patient tolerated injection well.

## 2018-11-02 ENCOUNTER — Ambulatory Visit: Payer: PPO | Admitting: Family Medicine

## 2018-11-07 ENCOUNTER — Other Ambulatory Visit: Payer: Self-pay | Admitting: Family Medicine

## 2018-11-08 ENCOUNTER — Ambulatory Visit: Payer: PPO

## 2018-11-08 NOTE — Telephone Encounter (Signed)
Historical med.  Ok to fill? 

## 2018-11-09 ENCOUNTER — Ambulatory Visit (INDEPENDENT_AMBULATORY_CARE_PROVIDER_SITE_OTHER): Payer: PPO | Admitting: *Deleted

## 2018-11-09 ENCOUNTER — Ambulatory Visit: Payer: PPO

## 2018-11-09 DIAGNOSIS — E538 Deficiency of other specified B group vitamins: Secondary | ICD-10-CM | POA: Diagnosis not present

## 2018-11-09 MED ORDER — CYANOCOBALAMIN 1000 MCG/ML IJ SOLN
1000.0000 ug | Freq: Once | INTRAMUSCULAR | Status: AC
  Administered 2018-11-09: 1000 ug via INTRAMUSCULAR

## 2018-11-09 NOTE — Progress Notes (Signed)
Per orders of Dr. Ethlyn Gallery, injection of Vit B12 given by Nyoka Cowden, ASHTYN M. Patient tolerated injection well.

## 2018-11-24 DIAGNOSIS — S335XXA Sprain of ligaments of lumbar spine, initial encounter: Secondary | ICD-10-CM | POA: Diagnosis not present

## 2018-11-24 DIAGNOSIS — S139XXA Sprain of joints and ligaments of unspecified parts of neck, initial encounter: Secondary | ICD-10-CM | POA: Diagnosis not present

## 2018-11-24 DIAGNOSIS — S300XXA Contusion of lower back and pelvis, initial encounter: Secondary | ICD-10-CM | POA: Diagnosis not present

## 2018-11-28 ENCOUNTER — Telehealth: Payer: Self-pay | Admitting: Family Medicine

## 2018-11-28 ENCOUNTER — Ambulatory Visit (INDEPENDENT_AMBULATORY_CARE_PROVIDER_SITE_OTHER): Payer: PPO | Admitting: Internal Medicine

## 2018-11-28 ENCOUNTER — Encounter: Payer: Self-pay | Admitting: Internal Medicine

## 2018-11-28 VITALS — BP 118/72 | HR 75 | Temp 99.8°F | Wt 154.5 lb

## 2018-11-28 DIAGNOSIS — J22 Unspecified acute lower respiratory infection: Secondary | ICD-10-CM

## 2018-11-28 DIAGNOSIS — I471 Supraventricular tachycardia: Secondary | ICD-10-CM

## 2018-11-28 DIAGNOSIS — J019 Acute sinusitis, unspecified: Secondary | ICD-10-CM | POA: Diagnosis not present

## 2018-11-28 MED ORDER — AMOXICILLIN 500 MG PO CAPS: 500.0000 mg | ORAL_CAPSULE | Freq: Three times a day (TID) | ORAL | 0 refills | Status: DC

## 2018-11-28 NOTE — Patient Instructions (Signed)
Chest is clear and reassuring 'primarily viral respiration infection.  But have sinus infection.  That may be helped by antibiotic .   conctct [place] if have side effecs . Saline   nose spray and warm compresses.     Sinusitis, Adult Sinusitis is inflammation of your sinuses. Sinuses are hollow spaces in the bones around your face. Your sinuses are located:  Around your eyes.  In the middle of your forehead.  Behind your nose.  In your cheekbones. Mucus normally drains out of your sinuses. When your nasal tissues become inflamed or swollen, mucus can become trapped or blocked. This allows bacteria, viruses, and fungi to grow, which leads to infection. Most infections of the sinuses are caused by a virus. Sinusitis can develop quickly. It can last for up to 4 weeks (acute) or for more than 12 weeks (chronic). Sinusitis often develops after a cold. What are the causes? This condition is caused by anything that creates swelling in the sinuses or stops mucus from draining. This includes:  Allergies.  Asthma.  Infection from bacteria or viruses.  Deformities or blockages in your nose or sinuses.  Abnormal growths in the nose (nasal polyps).  Pollutants, such as chemicals or irritants in the air.  Infection from fungi (rare). What increases the risk? You are more likely to develop this condition if you:  Have a weak body defense system (immune system).  Do a lot of swimming or diving.  Overuse nasal sprays.  Smoke. What are the signs or symptoms? The main symptoms of this condition are pain and a feeling of pressure around the affected sinuses. Other symptoms include:  Stuffy nose or congestion.  Thick drainage from your nose.  Swelling and warmth over the affected sinuses.  Headache.  Upper toothache.  A cough that may get worse at night.  Extra mucus that collects in the throat or the back of the nose (postnasal drip).  Decreased sense of smell and  taste.  Fatigue.  A fever.  Sore throat.  Bad breath. How is this diagnosed? This condition is diagnosed based on:  Your symptoms.  Your medical history.  A physical exam.  Tests to find out if your condition is acute or chronic. This may include: ? Checking your nose for nasal polyps. ? Viewing your sinuses using a device that has a light (endoscope). ? Testing for allergies or bacteria. ? Imaging tests, such as an MRI or CT scan. In rare cases, a bone biopsy may be done to rule out more serious types of fungal sinus disease. How is this treated? Treatment for sinusitis depends on the cause and whether your condition is chronic or acute.  If caused by a virus, your symptoms should go away on their own within 10 days. You may be given medicines to relieve symptoms. They include: ? Medicines that shrink swollen nasal passages (topical intranasal decongestants). ? Medicines that treat allergies (antihistamines). ? A spray that eases inflammation of the nostrils (topical intranasal corticosteroids). ? Rinses that help get rid of thick mucus in your nose (nasal saline washes).  If caused by bacteria, your health care provider may recommend waiting to see if your symptoms improve. Most bacterial infections will get better without antibiotic medicine. You may be given antibiotics if you have: ? A severe infection. ? A weak immune system.  If caused by narrow nasal passages or nasal polyps, you may need to have surgery. Follow these instructions at home: Medicines  Take, use, or apply over-the-counter  and prescription medicines only as told by your health care provider. These may include nasal sprays.  If you were prescribed an antibiotic medicine, take it as told by your health care provider. Do not stop taking the antibiotic even if you start to feel better. Hydrate and humidify   Drink enough fluid to keep your urine pale yellow. Staying hydrated will help to thin your  mucus.  Use a cool mist humidifier to keep the humidity level in your home above 50%.  Inhale steam for 10-15 minutes, 3-4 times a day, or as told by your health care provider. You can do this in the bathroom while a hot shower is running.  Limit your exposure to cool or dry air. Rest  Rest as much as possible.  Sleep with your head raised (elevated).  Make sure you get enough sleep each night. General instructions   Apply a warm, moist washcloth to your face 3-4 times a day or as told by your health care provider. This will help with discomfort.  Wash your hands often with soap and water to reduce your exposure to germs. If soap and water are not available, use hand sanitizer.  Do not smoke. Avoid being around people who are smoking (secondhand smoke).  Keep all follow-up visits as told by your health care provider. This is important. Contact a health care provider if:  You have a fever.  Your symptoms get worse.  Your symptoms do not improve within 10 days. Get help right away if:  You have a severe headache.  You have persistent vomiting.  You have severe pain or swelling around your face or eyes.  You have vision problems.  You develop confusion.  Your neck is stiff.  You have trouble breathing. Summary  Sinusitis is soreness and inflammation of your sinuses. Sinuses are hollow spaces in the bones around your face.  This condition is caused by nasal tissues that become inflamed or swollen. The swelling traps or blocks the flow of mucus. This allows bacteria, viruses, and fungi to grow, which leads to infection.  If you were prescribed an antibiotic medicine, take it as told by your health care provider. Do not stop taking the antibiotic even if you start to feel better.  Keep all follow-up visits as told by your health care provider. This is important. This information is not intended to replace advice given to you by your health care provider. Make sure you  discuss any questions you have with your health care provider. Document Released: 09/07/2005 Document Revised: 02/07/2018 Document Reviewed: 02/07/2018 Elsevier Interactive Patient Education  2019 [employer].

## 2018-11-28 NOTE — Telephone Encounter (Signed)
This is an interesting message; looks like she saw Dr. Regis Bill today so not sure if this was discussed. I don't have ortho note so not sure what was noted at a visit or symptom wise was reported.   However, I am fine with cardiology referral if she is having ongoing issues. Her preference is ok.

## 2018-11-28 NOTE — Telephone Encounter (Signed)
Dr Lynann Bologna at [facility] thinks patient has Supraventricular Tachycardia.  Patient states doctor is going to talk to Dr Ethlyn Gallery to send pt to a cardiologist.  Patient is requesting to go to either Dr Lorie Phenix or Dr Benita Gutter at [facility] at [place].
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
## 2018-11-29 NOTE — Telephone Encounter (Signed)
Referral sent 

## 2018-12-02 ENCOUNTER — Ambulatory Visit: Payer: Self-pay | Admitting: *Deleted

## 2018-12-02 NOTE — Telephone Encounter (Signed)
Pt calling stating that she has experienced fever, SOB and productive cough. Pt had concerns that she may need to be tested for the corona virus. Pt states that she has a doctor that is a friend that she has been around and thought that her friend may have been around someone who may have the corona virus. Pt denies any recent travel to High Risk areas of corona virus exposure and denies being in contact with anyone who has been diagnosed with corona virus. Pt states that she heard the president state that in order to be tested for the corona virus you would just need to contact the doctor's office and you could be tested. Explained to the pt that she was not considered High Risk for COVID-19 and testing was not available in the office. Pt verbalized understanding. Recent OV with Dr. Regis Bill on 11/28/18 and pt was diagnosed with sinusitis. Pt was prescribed an antibiotic and has not completed the course at this time. Pt states she still feels like her ears are stopped up and still has a productive cough but is a little better. Pt encouraged to finish course of antibiotics, rest and stay hydrated. Pt advised to return call to the office if she does not have any improvement in symptoms after completing the course of antibiotics or is she becomes worse. Pt verbalized understanding.   Reason for Disposition . [1] Recent medical visit within 24 hours AND [2] condition/symptoms BETTER (improving) AND [3] caller has additional questions triager can answer  Answer Assessment - Initial Assessment Questions 1. MAIN CONCERN OR SYMPTOM:  "What is your main concern right now?" "What question do you have?" "What's the main symptom you're worried about?" (e.g., breathing difficulty, cough, fever. pain)     Productive cough and having corona virus 2. ONSET: "When did the  cough  start?"     Sore throat on the 5th and other symptoms began to develop on the 6th and 7th 3. BETTER-SAME-WORSE: "Are you getting better, staying the  same, or getting worse compared to how you felt at your last visit to the doctor (most recent medical visit)"?     A little better 4. VISIT DATE: "When were you seen?" (Date)     11/28/18 5. VISIT DOCTOR: "What is the name of the doctor taking care of you now?"     Dr. Regis Bill 6. VISIT DIAGNOSIS:  "What was the main symptom or problem that you were seen for?" "Were you given a diagnosis?"     Sinusitis 7. VISIT MEDICATIONS: "Did the physician order any new medicines for you to use?" If yes: "Have you filled the prescription and started taking the medicine?"      Was prescribed antibiotics but has not completed the course yet 8. NEXT APPOINTMENT: "Have you scheduled a follow-up appointment with your doctor?"     No 9. PAIN: "Is there any pain?" If so, ask: "How bad is it?"  (Scale 1-10; or mild, moderate, severe)     Sore  10. FEVER: "Do you have a fever?" If so, ask: "What is it, how was it measured  and when did it start?"       Pt reports fever but actual reading not given 11. OTHER SYMPTOMS: "Do you have any other symptoms?"       Sore throat, ears stopped up, productive cough  Protocols used: RECENT MEDICAL VISIT FOR ILLNESS FOLLOW-UP CALL-A-AH

## 2018-12-06 DIAGNOSIS — Z79891 Long term (current) use of opiate analgesic: Secondary | ICD-10-CM | POA: Diagnosis not present

## 2018-12-06 DIAGNOSIS — G90512 Complex regional pain syndrome I of left upper limb: Secondary | ICD-10-CM | POA: Diagnosis not present

## 2018-12-06 DIAGNOSIS — M542 Cervicalgia: Secondary | ICD-10-CM | POA: Diagnosis not present

## 2018-12-06 DIAGNOSIS — G894 Chronic pain syndrome: Secondary | ICD-10-CM | POA: Diagnosis not present

## 2018-12-07 ENCOUNTER — Ambulatory Visit: Payer: PPO

## 2018-12-07 ENCOUNTER — Ambulatory Visit: Payer: PPO | Admitting: Cardiovascular Disease

## 2018-12-09 ENCOUNTER — Other Ambulatory Visit: Payer: Self-pay | Admitting: Family Medicine

## 2018-12-09 ENCOUNTER — Telehealth: Payer: Self-pay

## 2018-12-09 MED ORDER — CYANOCOBALAMIN 1000 MCG/ML IJ SOLN: 1000.0000 ug | Freq: Once | INTRAMUSCULAR | 3 refills | Status: DC

## 2018-12-09 NOTE — Telephone Encounter (Signed)
Spoke with patient. She stated that she has to have these injections in the glutte. Patient stated that her neighbor is a doctor and would be willing to give her injections at home if this is permitted. Patient will need a prescription for B-12.

## 2018-12-09 NOTE — Telephone Encounter (Signed)
Copied from [place] [id number]. Topic: General - Inquiry >> Dec 07, 2018  9:47 AM Rutherford Nail, NT wrote: Reason for CRM: Patient calling and states that she has to come in for a B12 injection. States that she has a compromised immune system due to a disease and severe asthma. Would like to know if Dr Ethlyn Gallery would still recommend for her to come get this B12 injection and risk possible exposure of COVID 19? Please advise.

## 2018-12-09 NOTE — Telephone Encounter (Signed)
I agree that we would like to limit her exposure. We are working as office on way to get around this (possibly having nurse come out to cars for well patients needing injections). This is work in progress. We should know more about this by next week. If you can put her on a list of patients to touch base with; we can try to call her back once we get this figured out? Until then I would avoid going to doc office if possible.

## 2018-12-09 NOTE — Telephone Encounter (Signed)
I have sent this in to her pharmacy for her. Not sure about coverage but this is a very good option for her to be able to do at home by provider. Could you please send in proper syringes and needles for her? Thanks

## 2018-12-12 ENCOUNTER — Ambulatory Visit: Payer: Self-pay

## 2018-12-12 NOTE — Telephone Encounter (Signed)
Incoming call from Patient who states that she is still having a productive cough of yellow cough after completing original therapy.  Onset was March 5th or 6th.  Continues to take Mucinex.  Patient is brining up  A good amount of Yellow  Phlegm.  Denies Hemoptysis.  Denies hemoptysis.  Has a Hx of ventricular tachycardia and asthma.  Denies  Runny PE  . Positive for runny nose.   Denies pregnancy and travel  Outside  the country.  Patient request a Rx to [facility]   [address]. Patient states that she can take amoxicillin and Zpak   2  [name] Female, 58 y.o., 11-17-1960 MRN:  [id number] Phone:  [phone] (H) PCP:  Caren Macadam, MD Primary [facility] [place] [employer] PPO Next Appt With Family Medicine 12/13/2018 at 1:45 PM Message from Valla Leaver sent at 12/12/2018 12:29 PM EDT   Summary: productive cough   Productive cough after completing treatment        Call History    Type Contact Phone  12/12/2018 12:27 PM Phone (Incoming) Maeby, [place] (Self) [phone] (H)  User: Valla Leaver    Reason for Disposition . Cough with cold symptoms (e.g., runny nose, postnasal drip, throat clearing)  Answer Assessment - Initial Assessment Questions 1. ONSET: "When did the cough begin?"      Office February  End , beginning of marcg 5 and 6 2. SEVERITY: "How bad is the cough today?"      Constant mucinex  3. RESPIRATORY DISTRESS: "Describe your breathing."      denies 4. FEVER: "Do you have a fever?" If so, ask: "What is your temperature, how was it measured, and when did it start?"     denies 5. SPUTUM: "Describe the color of your sputum" (clear, white, yellow, green)     Yellow  Good amount  6. HEMOPTYSIS: "Are you coughing up any blood?" If so ask: "How much?" (flecks, streaks, tablespoons, etc.)    denies 7. CARDIAC HISTORY: "Do you have any history of heart disease?" (e.g., heart attack, congestive heart failure)      Super  ventricular tachycardia 8. LUNG HISTORY: "Do you have any history of lung disease?"  (e.g., pulmonary embolus, asthma, emphysema)     Asthma  9. PE RISK FACTORS: "Do you have a history of blood clots?" (or: recent major surgery, recent prolonged travel, bedridden)     Denies  10. OTHER SYMPTOMS: "Do you have any other symptoms?" (e.g., runny nose, wheezing, chest pain)       Runny nose ,  11. PREGNANCY: "Is there any chance you are pregnant?" "When was your last menstrual period?"  Denies         12. TRAVEL: "Have you traveled out of the country in the last month?" (e.g., travel history, exposures)       Denies  Protocols used: [place]

## 2018-12-12 NOTE — Telephone Encounter (Signed)
Rx for this is not needed.

## 2018-12-13 ENCOUNTER — Ambulatory Visit: Payer: PPO

## 2018-12-14 ENCOUNTER — Other Ambulatory Visit: Payer: Self-pay

## 2018-12-14 ENCOUNTER — Other Ambulatory Visit: Payer: Self-pay | Admitting: Family Medicine

## 2018-12-14 ENCOUNTER — Ambulatory Visit (INDEPENDENT_AMBULATORY_CARE_PROVIDER_SITE_OTHER): Payer: PPO | Admitting: Family Medicine

## 2018-12-14 DIAGNOSIS — R059 Cough, unspecified: Secondary | ICD-10-CM

## 2018-12-14 DIAGNOSIS — J0100 Acute maxillary sinusitis, unspecified: Secondary | ICD-10-CM | POA: Diagnosis not present

## 2018-12-14 DIAGNOSIS — K589 Irritable bowel syndrome without diarrhea: Secondary | ICD-10-CM | POA: Diagnosis not present

## 2018-12-14 DIAGNOSIS — R05 Cough: Secondary | ICD-10-CM

## 2018-12-14 MED ORDER — AZITHROMYCIN 250 MG PO TABS: ORAL_TABLET | ORAL | 0 refills | Status: DC

## 2018-12-14 MED ORDER — METOCLOPRAMIDE HCL 10 MG PO TABS: 10.0000 mg | ORAL_TABLET | Freq: Four times a day (QID) | ORAL | 2 refills | Status: DC | PRN

## 2018-12-14 NOTE — Telephone Encounter (Signed)
It has now been 2 days since message came to me. Please check back in with symptoms.  We could do virtual visit as well.   Please see if there was any improvement or change in symptoms after completing last antibiotic?

## 2018-12-14 NOTE — Telephone Encounter (Signed)
Patient complains of nasal congestion and cough. Productive cough. Mucus is yellow. Slight improvement with antibiotic but not significant.
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
## 2018-12-20 ENCOUNTER — Telehealth: Payer: Self-pay

## 2018-12-20 NOTE — Telephone Encounter (Signed)
Author phoned pt. to assess interest in scheduling virtual awv. No answer. Routed to CMA to follow-up as pt. Is due.

## 2018-12-20 NOTE — Telephone Encounter (Signed)
   Cardiac Questionnaire:    Since your last visit or hospitalization:    1. Have you been having new or worsening chest pain? DENIES CP. PT STATES SHE HAS PALPS THAT SHE DESCRIBES AS 'FEELING LIKE HEART GOING TO RIP OUT OF CHEST'. NO PALPITATIONS REPORTED DURING PHONE ENCOUNTER   2. Have you been having new or worsening shortness of breath? NO 3. Have you been having new or worsening leg swelling, wt gain, or increase in abdominal girth (pants fitting more tightly)? LEG SWELLING COMES AND GOES AND DEVELOPED IN THE LAST COUPLE YEARS   4. Have you had any passing out spells? YES; BELIEVES IT TO BE D/T SVT (PT REFERRED FOR SVT)    *A YES to any of these questions would result in the appointment being KEPT. *If all the answers to these questions are NO, we should indicate that given the current situation regarding the worldwide coronarvirus pandemic, at the recommendation of the CDC, we are looking to limit gatherings in our waiting area, and thus will reschedule their appointment beyond four weeks from today.   _____________   KMMNO-17 Pre-Screening Questions:  . Do you currently have a fever? NO (yes = cancel and refer to pcp for e-visit) . Have you recently travelled on a cruise, internationally, or to [place], [place], [place], [place], [place], or [place], [place] [employer]) ? NO (yes = cancel, stay home, monitor symptoms, and contact pcp or initiate e-visit if symptoms develop) . Have you been in contact with someone that is currently pending confirmation of Covid19 testing or has been confirmed to have the [place] virus?  NO (yes = cancel, stay home, away from tested individual, monitor symptoms, and contact pcp or initiate e-visit if symptoms develop) . Are you currently experiencing fatigue or cough? ASTHMA COUGH (yes = pt should be prepared to have a mask placed at the time of their visit).

## 2018-12-20 NOTE — Telephone Encounter (Signed)
Noted. Will follow up.

## 2018-12-21 ENCOUNTER — Encounter: Payer: Self-pay | Admitting: Cardiovascular Disease

## 2018-12-21 ENCOUNTER — Telehealth: Payer: Self-pay | Admitting: Cardiovascular Disease

## 2018-12-21 ENCOUNTER — Telehealth: Payer: Self-pay

## 2018-12-21 ENCOUNTER — Telehealth (INDEPENDENT_AMBULATORY_CARE_PROVIDER_SITE_OTHER): Payer: PPO | Admitting: Cardiovascular Disease

## 2018-12-21 VITALS — BP 110/80 | HR 75

## 2018-12-21 DIAGNOSIS — R002 Palpitations: Secondary | ICD-10-CM | POA: Diagnosis not present

## 2018-12-21 DIAGNOSIS — R55 Syncope and collapse: Secondary | ICD-10-CM | POA: Diagnosis not present

## 2018-12-21 DIAGNOSIS — R0789 Other chest pain: Secondary | ICD-10-CM | POA: Diagnosis not present

## 2018-12-21 DIAGNOSIS — R079 Chest pain, unspecified: Secondary | ICD-10-CM

## 2018-12-21 NOTE — Progress Notes (Signed)
Hi Dr. Gwenlyn Found - thanks for keeping me updated. It looks like you have already entered order for 30 day event monitor. I'm not sure if I was supposed to receive this message through patient calls but wanted to make sure that I didn't need to order anything on my end (coronary calcium score?) and that she was set for follow up with you. Thanks!

## 2018-12-21 NOTE — Telephone Encounter (Signed)
LEFT MESSAGE OF AVS INSTRUCTIONS; INSTRUCTIONS TO BE MAILED TO PT

## 2018-12-21 NOTE — Telephone Encounter (Signed)
Pt aware stroke was added to family hx .Adonis Housekeeper

## 2018-12-21 NOTE — Telephone Encounter (Signed)
Pt aware of virtual appt time at 1300 with Dr. Gwenlyn Found
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
## 2018-12-21 NOTE — Patient Instructions (Signed)
Medication Instructions:  Your physician recommends that you continue on your current medications as directed. Please refer to the Current Medication list given to you today.  If you need a refill on your cardiac medications before your next appointment, please call your pharmacy.   Lab work: NONE If you have labs (blood work) drawn today and your tests are completely normal, you will receive your results only by: Marland Kitchen MyChart Message (if you have MyChart) OR . A paper copy in the mail If you have any lab test that is abnormal or we need to change your treatment, we will call you to review the results.  Testing/Procedures: Your physician has recommended that you wear a 30-day event monitor. Event monitors are medical devices that record the heart's electrical activity. Doctors most often [place] these monitors to diagnose arrhythmias. Arrhythmias are problems with the speed or rhythm of the heartbeat. The monitor is a small, portable device. You can wear one while you do your normal daily activities. This is usually used to diagnose what is causing palpitations/syncope (passing out).  Your physician has recommended the following: Coronary Calcium Scan A coronary calcium scan is an imaging test used to look for deposits of calcium and other fatty materials (plaques) in the inner lining of the blood vessels of the heart (coronary arteries). These deposits of calcium and plaques can partly clog and narrow the coronary arteries without producing any symptoms or warning signs. This puts a person at risk for a heart attack. This test can detect these deposits before symptoms develop. Tell a health care provider about:  Any allergies you have.  All medicines you are taking, including vitamins, herbs, eye drops, creams, and over-the-counter medicines.  Any problems you or family members have had with anesthetic medicines.  Any blood disorders you have.  Any surgeries you have had.  Any medical  conditions you have.  Whether you are pregnant or may be pregnant. What are the risks? Generally, this is a safe procedure. However, problems may occur, including:  Harm to a pregnant woman and her unborn baby. This test involves the use of radiation. Radiation exposure can be dangerous to a pregnant woman and her unborn baby. If you are pregnant, you generally should not have this procedure done.  Slight increase in the risk of cancer. This is because of the radiation involved in the test. What happens before the procedure? No preparation is needed for this procedure. What happens during the procedure?   You will undress and remove any jewelry around your neck or chest.  You will put on a hospital gown.  Sticky electrodes will be placed on your chest. The electrodes will be connected to an electrocardiogram (ECG) machine to record a tracing of the electrical activity of your heart.  A CT scanner will take pictures of your heart. During this time, you will be asked to lie still and hold your breath for 2-3 seconds while a picture of your heart is being taken. The procedure may vary among health care providers and hospitals. What happens after the procedure?  You can get dressed.  You can return to your normal activities.  It is up to you to get the results of your test. Ask your health care provider, or the department that is doing the test, when your results will be ready. Summary  A coronary calcium scan is an imaging test used to look for deposits of calcium and other fatty materials (plaques) in the inner lining of the  blood vessels of the heart (coronary arteries).  Generally, this is a safe procedure. Tell your health care provider if you are pregnant or may be pregnant.  No preparation is needed for this procedure.  A CT scanner will take pictures of your heart.  You can return to your normal activities after the scan is done.   Follow-Up: At [facility], you and  your health needs are our priority.  As part of our continuing mission to provide you with exceptional heart care, we have created designated Provider Care Teams.  These Care Teams include your primary Cardiologist (physician) and Advanced Practice Providers (APPs -  Physician Assistants and Nurse Practitioners) who all work together to provide you with the care you need, when you need it. You will need a follow up appointment in 3 months with Dr. Gwenlyn Found. You will be contacted by our office to set up this appointment.

## 2018-12-21 NOTE — Telephone Encounter (Addendum)
Virtual Visit Pre-Appointment Phone Call  Steps For Call:  1. Confirm consent - "In the setting of the current Covid19 crisis, you are scheduled for a (phone or video) visit with your provider on (date) at (time).  Just as we do with many in-office visits, in order for you to participate in this visit, we must obtain consent.  If you'd like, I can send this to your mychart (if signed up) or email for you to review.  Otherwise, I can obtain your verbal consent now.  All virtual visits are billed to your insurance company just like a normal visit would be.  By agreeing to a virtual visit, we'd like you to understand that the technology does not allow for your provider to perform an examination, and thus may limit your provider's ability to fully assess your condition.  Finally, though the technology is pretty good, we cannot assure that it will always work on either your or our end, and in the setting of a video visit, we may have to convert it to a phone-only visit.  In either situation, we cannot ensure that we have a secure connection.  Are you willing to proceed?"  2. Give patient instructions for WebEx download to smartphone as below if video visit  3. Advise patient to be prepared with any vital sign or heart rhythm information, their current medicines, and a piece of paper and pen handy for any instructions they may receive the day of their visit  4. Inform patient they will receive a phone call 15 minutes prior to their appointment time (may be from unknown caller ID) so they should be prepared to answer  5. Confirm that appointment type is correct in Epic appointment notes (video vs telephone)    TELEPHONE CALL NOTE  Jo [name] has been deemed a candidate for a follow-up tele-health visit to limit community exposure during the Covid-19 pandemic. I spoke with the patient via phone to ensure availability of phone/video source, confirm preferred email & phone number, and discuss  instructions and expectations.  I reminded Jo [name] to be prepared with any vital sign and/or heart rhythm information that could potentially be obtained via home monitoring, at the time of her visit. I reminded Jo [name] to expect a phone call at the time of her visit if her visit.  Did the patient verbally acknowledge consent to treatment? YES Jo Maura Dawna Part, RN 12/21/2018    DOWNLOADING THE [place], go to App Store and type in WebEx in the search bar. [place] [employer], the blue/green circle. The app is free but as with any other app downloads, their phone may require them to verify saved payment information or Apple password. The patient does NOT have to create an account.  - If Android, ask patient to go to [employer] and type in WebEx in the search bar. [place] [employer], the blue/green circle. The app is free but as with any other app downloads, their phone may require them to verify saved payment information or Android password. The patient does NOT have to create an account.   CONSENT FOR TELE-HEALTH VISIT - PLEASE REVIEW  I hereby voluntarily request, consent and authorize [facility] and its employed or contracted physicians, physician assistants, nurse practitioners or other licensed health care professionals (the Practitioner), to provide me with telemedicine health care services (the "Services") as deemed necessary by the treating Practitioner. I acknowledge  and consent to receive the Services by the Practitioner via telemedicine. I understand that the telemedicine visit will involve communicating with the Practitioner through live audiovisual communication technology and the disclosure of certain medical information by electronic transmission. I acknowledge that I have been given the opportunity to request an in-person assessment or other available alternative prior to the telemedicine visit and am voluntarily  participating in the telemedicine visit.  I understand that I have the right to withhold or withdraw my consent to the use of telemedicine in the course of my care at any time, without affecting my right to future care or treatment, and that the Practitioner or I may terminate the telemedicine visit at any time. I understand that I have the right to inspect all information obtained and/or recorded in the course of the telemedicine visit and may receive copies of available information for a reasonable fee.  I understand that some of the potential risks of receiving the Services via telemedicine include:  Marland Kitchen Delay or interruption in medical evaluation due to technological equipment failure or disruption; . Information transmitted may not be sufficient (e.g. poor resolution of images) to allow for appropriate medical decision making by the Practitioner; and/or  . In rare instances, security protocols could fail, causing a breach of personal health information.  Furthermore, I acknowledge that it is my responsibility to provide information about my medical history, conditions and care that is complete and accurate to the best of my ability. I acknowledge that Practitioner's advice, recommendations, and/or decision may be based on factors not within their control, such as incomplete or inaccurate data provided by me or distortions of diagnostic images or specimens that may result from electronic transmissions. I understand that the practice of medicine is not an exact science and that Practitioner makes no warranties or guarantees regarding treatment outcomes. I acknowledge that I will receive a copy of this consent concurrently upon execution via email to the email address I last provided but may also request a printed copy by calling the office of [place].    I understand that my insurance will be billed for this visit.   I have read or had this consent read to me. . I understand the contents of this  consent, which adequately explains the benefits and risks of the Services being provided via telemedicine.  . I have been provided ample opportunity to ask questions regarding this consent and the Services and have had my questions answered to my satisfaction. . I give my informed consent for the services to be provided through the use of telemedicine in my medical care  By participating in this telemedicine visit I agree to the above.

## 2018-12-21 NOTE — Telephone Encounter (Signed)
New Message   Patient is calling because she failed to provide some family history with her last appt. She said that her mother did have a history of having several small strokes.

## 2018-12-21 NOTE — Telephone Encounter (Signed)
Her chest pain really sounds noncardiac.  She is been having this for years.  It is mostly associated with palpitations.  I think we can safely do a coronary calcium score to further evaluate.

## 2018-12-22 NOTE — Telephone Encounter (Signed)
Pt already aware of MD recommendations. AVS was reviewed with pt on the day of virtual appt.

## 2018-12-27 ENCOUNTER — Telehealth: Payer: Self-pay | Admitting: Radiology

## 2018-12-27 NOTE — Telephone Encounter (Signed)
Enrolled patient with [employer] for a 30 day Event monitor to be mailed due to Covid-19. Patient was given instructions and knows to expect monitor in 3-5 days

## 2019-01-02 ENCOUNTER — Encounter (INDEPENDENT_AMBULATORY_CARE_PROVIDER_SITE_OTHER): Payer: PPO

## 2019-01-02 ENCOUNTER — Other Ambulatory Visit: Payer: Self-pay | Admitting: Family Medicine

## 2019-01-02 DIAGNOSIS — R002 Palpitations: Secondary | ICD-10-CM | POA: Diagnosis not present

## 2019-01-03 ENCOUNTER — Other Ambulatory Visit: Payer: Self-pay | Admitting: Family Medicine

## 2019-01-03 NOTE — Telephone Encounter (Signed)
Ok to fill? Med is from a historical provider.

## 2019-01-04 NOTE — Telephone Encounter (Signed)
Please confirm dose; we were dosing differently with 500mg ; please review list and then ok to refill how she is taking medication.

## 2019-01-04 NOTE — Telephone Encounter (Signed)
Filled by a historical provider. Ok to fill? °

## 2019-01-06 ENCOUNTER — Other Ambulatory Visit: Payer: Self-pay | Admitting: Family Medicine

## 2019-01-06 MED ORDER — METFORMIN HCL 500 MG PO TABS: ORAL_TABLET | ORAL | 1 refills | Status: DC

## 2019-01-06 NOTE — Telephone Encounter (Signed)
I sent in refill of medication how we had it listed in her med list.   I don't see documented that you confirmed dose with her. I just wanted to double check since we hadn't filled before. I tried to call and didn't get answer. I didn't want her to be without medication over weekend so sent based on our most recent med list.   Please advise if I am missing response/dose validation.

## 2019-01-26 ENCOUNTER — Other Ambulatory Visit: Payer: Self-pay

## 2019-02-21 DIAGNOSIS — M542 Cervicalgia: Secondary | ICD-10-CM | POA: Diagnosis not present

## 2019-02-21 DIAGNOSIS — G894 Chronic pain syndrome: Secondary | ICD-10-CM | POA: Diagnosis not present

## 2019-02-21 DIAGNOSIS — G90512 Complex regional pain syndrome I of left upper limb: Secondary | ICD-10-CM | POA: Diagnosis not present

## 2019-02-21 DIAGNOSIS — Z79891 Long term (current) use of opiate analgesic: Secondary | ICD-10-CM | POA: Diagnosis not present

## 2019-03-09 ENCOUNTER — Telehealth: Payer: Self-pay | Admitting: *Deleted

## 2019-03-09 NOTE — Telephone Encounter (Signed)
Copied from [place] [phone]. Topic: General - Inquiry >> Mar 09, 2019 10:47 AM Scherrie Gerlach wrote: Reason for CRM: [place] with [facility] going over pt's record and needs to clarify a med.  Pt is on metFORMIN (GLUCOPHAGE) 500 MG tablet.  She does not see a dx of DM. No A1C checked.  Last note 2010 for any mention of sugar issue..  Last cardiology note stated pt doesn't have DM.Marland Kitchen also the dx of low B12 could also be attributing to the taking of metformin if you do not need. Please call back

## 2019-03-09 NOTE — Telephone Encounter (Signed)
Looks like this was historical med that we filled for first time in April. Please check with Maressa. I did not document DM before. Not sure if related to gyn issue? Never got all her previous records. Patient is pretty aware of what she is taking. See if she remembers reason for starting and who started med.

## 2019-03-10 ENCOUNTER — Encounter: Payer: Self-pay | Admitting: Family Medicine

## 2019-03-10 NOTE — Telephone Encounter (Signed)
I called the pt and she stated she is taking Metformin 1000mg  BID due to PCOS and this was lastly given by her OB/GYN.  Message sent to Dr Ethlyn Gallery.

## 2019-03-10 NOTE — Telephone Encounter (Signed)
Please let Jo [name]/thn know. Not sure how to get this message back to her?

## 2019-03-14 NOTE — Telephone Encounter (Signed)
I called Romelle Starcher, the pharmacist at [phone] and informed her of the message below.  Romelle Starcher stated to let Dr Ethlyn Gallery know that the pt will fail quality measures for statin use in diabetics since she is not diabetic and is on this medication.  Message forwarded to Dr Ethlyn Gallery.

## 2019-03-15 ENCOUNTER — Other Ambulatory Visit: Payer: Self-pay | Admitting: Family Medicine

## 2019-03-15 NOTE — Telephone Encounter (Signed)
metformin has an indication for polycystic ovary syndrome, which I have updated to include in the patient's chart. Since she is not diabetic and taking the metformin for a legitimate medical reason then there is no reason she should "fail quality measures".

## 2019-03-17 NOTE — Telephone Encounter (Signed)
Noted  

## 2019-03-23 ENCOUNTER — Telehealth: Payer: Self-pay

## 2019-03-23 NOTE — Telephone Encounter (Signed)
Left message to call back 7/6 to confirm that she is okay with proceeding with the coronary calcium score since Dr. Gwenlyn Found wants her to get it done.   Lorretta Harp, MD  Annita Brod, RN        Would like to still get.    JJB   Previous Messages  ----- Message -----  From: Annita Brod, RN  Sent: 03/17/2019  6:22 PM EDT  To: Lorretta Harp, MD  Subject: [name]: Calcium Scoring                 ----- Message -----  From: Jo [name]  Sent: 03/10/2019 10:28 AM EDT  To: Annita Brod, RN  Subject: Calcium Scoring                  [name] - I called this patient to let her know I had scheduled her calcium scoring. She didn't realize her insurance does not pay for it. She wants to know if Dr Gwenlyn Found still wants her to have it based on the results of her monitor. If so, she will find a way to pay for it.

## 2019-03-24 ENCOUNTER — Other Ambulatory Visit: Payer: Self-pay | Admitting: Family Medicine

## 2019-04-14 ENCOUNTER — Other Ambulatory Visit: Payer: Self-pay

## 2019-04-14 MED ORDER — LAMOTRIGINE 100 MG PO TABS: 100.0000 mg | ORAL_TABLET | Freq: Two times a day (BID) | ORAL | 0 refills | Status: DC

## 2019-04-14 NOTE — Telephone Encounter (Signed)
Last OV 12/14/18 Last refill Levothyroxine - historical provider                 Lamictal 11/09/18 #180/1 Next OV not scheduled

## 2019-04-14 NOTE — Telephone Encounter (Signed)
Forwarding to provider - last refill historical provider.

## 2019-04-17 NOTE — Telephone Encounter (Signed)
She is due for CCV and bloodwork in august. Please schedule visit. We can order labs at that time. Doxy is ok

## 2019-04-18 MED ORDER — LEVOTHYROXINE SODIUM 125 MCG PO TABS: 125.0000 ug | ORAL_TABLET | Freq: Every day | ORAL | 0 refills | Status: DC

## 2019-04-18 NOTE — Telephone Encounter (Signed)
I called the pt and informed her of the message below.  Appt scheduled for 8/31.  Patient complains of swelling bilateral lower extremities for the past 2 days and has used HCTZ, with no relief which was given by her previous PCP.  Denies any shortness of breath or other symptoms.  I offered an appt for tomorrow, in office with Dr Jerilee Hoh and the pt declined, stated 8am was too early and she is sick every morning due to chronic pain.  Appt scheduled for Thursday at 11am.

## 2019-04-18 NOTE — Addendum Note (Signed)
Addended by: Agnes Lawrence on: 04/18/2019 04:21 PM   Modules accepted: Orders

## 2019-04-19 ENCOUNTER — Other Ambulatory Visit: Payer: Self-pay

## 2019-04-19 ENCOUNTER — Ambulatory Visit (INDEPENDENT_AMBULATORY_CARE_PROVIDER_SITE_OTHER)
Admission: RE | Admit: 2019-04-19 | Discharge: 2019-04-19 | Disposition: A | Discharge status: Home / Self Care | Payer: Self-pay | Source: Ambulatory Visit | Attending: Cardiovascular Disease | Admitting: Cardiovascular Disease

## 2019-04-19 DIAGNOSIS — R079 Chest pain, unspecified: Secondary | ICD-10-CM

## 2019-04-19 DIAGNOSIS — R0789 Other chest pain: Secondary | ICD-10-CM

## 2019-04-20 ENCOUNTER — Ambulatory Visit (INDEPENDENT_AMBULATORY_CARE_PROVIDER_SITE_OTHER): Payer: PPO | Admitting: Internal Medicine

## 2019-04-20 ENCOUNTER — Other Ambulatory Visit: Payer: Self-pay

## 2019-04-20 ENCOUNTER — Encounter: Payer: Self-pay | Admitting: Internal Medicine

## 2019-04-20 VITALS — BP 110/80 | HR 70 | Temp 97.9°F | Wt 164.7 lb

## 2019-04-20 DIAGNOSIS — R600 Localized edema: Secondary | ICD-10-CM | POA: Diagnosis not present

## 2019-04-20 LAB — COMPREHENSIVE METABOLIC PANEL
ALT: 13 U/L (ref 0–35)
AST: 36 U/L (ref 0–37)
Albumin: 4.2 g/dL (ref 3.5–5.2)
Alkaline Phosphatase: 49 U/L (ref 39–117)
BUN: 13 mg/dL (ref 6–23)
CO2: 27 mEq/L (ref 19–32)
Calcium: 9.4 mg/dL (ref 8.4–10.5)
Chloride: 104 mEq/L (ref 96–112)
Creatinine, Ser: 0.91 mg/dL (ref 0.40–1.20)
GFR: 64.03 mL/min (ref >60.00)
Glucose, Bld: 96 mg/dL (ref 70–99)
Potassium: 4.1 mEq/L (ref 3.5–5.1)
Sodium: 139 mEq/L (ref 135–145)
Total Bilirubin: 0.3 mg/dL (ref 0.2–1.2)
Total Protein: 6.5 g/dL (ref 6.0–8.3)

## 2019-04-20 NOTE — Patient Instructions (Signed)
-  Nice meeting you today!!  -Compression stockings, knee high during waking hours.  -Lab work today; will notify once results are available.  -F/u with PCP as needed if no improvement.

## 2019-04-20 NOTE — Progress Notes (Signed)
Established Patient Office Visit     CC/Reason for Visit: LE edema  HPI: Jo [name] is a 58 y.o. female who is coming in today for the above mentioned reasons. For about 1 month has noticed LE edema bilaterally. No pain. She does state she has been eating more due to her insomnia and has gained some weight. No CP, palpitations, SOB.   Past Medical/Surgical History: Past Medical History:  Diagnosis Date  . Allergy   . Anemia   . Anxiety   . Anxiety and depression   . Arthritis   . Asthma   . Chronic headaches   . Colon polyp   . COPD (chronic obstructive pulmonary disease) (HCC)    no o2  . Depression   . Fundic gland polyps of stomach, benign   . Gastritis   . GERD (gastroesophageal reflux disease)   . H. pylori infection 2013  . H/O gastric ulcer 1985  . History of anorexia nervosa   . History of bulimia   . History of pneumonia   . Hypothyroidism   . Irritable bowel syndrome (IBS)   . Obesity   . RSD (reflex sympathetic dystrophy)   . RSD (reflex sympathetic dystrophy)   . Ulcer    gastric, duodenal ulcer    Past Surgical History:  Procedure Laterality Date  . CERVICAL SPINE SURGERY  1990  . CERVIX SURGERY    . CHOLECYSTECTOMY    . COLONOSCOPY    . KNEE ARTHROSCOPY  04/21/2012   right  . LAPAROSCOPIC NISSEN FUNDOPLICATION  82/99/3716   Dr. Johnathan Hausen  . lumbar spondylosis injected  2013   Dr. Mina Marble  . NASAL SINUS SURGERY    . THORACOSCOPY W/ THORACIC SYMPATHECTOMY     for RSD  . TONSILLECTOMY    . UPPER GASTROINTESTINAL ENDOSCOPY      Social History:  reports that she has never smoked. She has never used smokeless tobacco. She reports that she does not drink alcohol or use drugs.  Allergies: Allergies  Allergen Reactions  . Bee Pollen Anaphylaxis  . Diazepam     sucidal depression  . Prednisone Nausea And Vomiting    Per pt can not take oral steroids   . Amoxicillin     Severe diarrhea At high dose   . Doxycycline     Pt does not  remember reaction  . Erythromycin     n & v  . Morphine     n & v  . Propoxyphene N-Acetaminophen     n & V  . Sulfa Antibiotics     MIGRAINES  . Tetracyclines & Related     N & v  . Propoxyphene Other (See Comments)    Family History:  Family History  Problem Relation Age of Onset  . Colon polyps Mother        part of intestines removed  . Raynaud syndrome Mother   . Irritable bowel syndrome Mother   . Other Mother        cold agluten  . Stroke Mother   . Colon polyps Brother   . Kidney Stones Brother   . Breast cancer Maternal Aunt   . Lung cancer Maternal Grandfather   . Colon cancer Neg Hx   . Esophageal cancer Neg Hx   . Rectal cancer Neg Hx   . Stomach cancer Neg Hx   . Migraines Neg Hx   . Seizures Neg Hx      Current Outpatient  Medications:  .  acyclovir ointment (ZOVIRAX) 5 %, Apply 1 application topically every 3 (three) hours., Disp: 5 g, Rfl: 2 .  azelastine (ASTELIN) 0.1 % nasal spray, Place 1 spray into both nostrils 2 (two) times daily. Use in each nostril as directed, Disp: 30 mL, Rfl: 2 .  busPIRone (BUSPAR) 15 MG tablet, TAKE 1/2 TABLET BY MOUTH TWICE A DAY, Disp: 90 tablet, Rfl: 3 .  cholecalciferol (VITAMIN D) 1000 UNITS tablet, Take 2,000 Units by mouth daily., Disp: , Rfl:  .  Cranberry 200 MG CAPS, Take 200 mg by mouth 2 (two) times daily., Disp: , Rfl:  .  erythromycin with ethanol (EMGEL) 2 % gel, Apply topically 2 (two) times daily., Disp: 30 g, Rfl: 1 .  ESTRADIOL ACETATE PO, Take 2 mg by mouth daily. 2 mg tablet, Disp: , Rfl:  .  fexofenadine-pseudoephedrine (ALLEGRA-D 24) 180-240 MG 24 hr tablet, Take 1 tablet by mouth at bedtime., Disp: , Rfl:  .  fluticasone (FLONASE) 50 MCG/ACT nasal spray, Place 2 sprays into both nostrils daily., Disp: 16 g, Rfl: 2 .  HYDROmorphone (DILAUDID) 4 MG tablet, Take 4 mg by mouth every 4 (four) hours as needed., Disp: , Rfl:  .  lamoTRIgine (LAMICTAL) 100 MG tablet, Take 1 tablet (100 mg total) by mouth 2  (two) times daily., Disp: 180 tablet, Rfl: 0 .  levothyroxine (SYNTHROID) 125 MCG tablet, Take 1 tablet (125 mcg total) by mouth daily., Disp: 90 tablet, Rfl: 0 .  medroxyPROGESTERone (PROVERA) 2.5 MG tablet, Take 2.5 mg by mouth. 1 tab every other day, Disp: , Rfl: 2 .  metFORMIN (GLUCOPHAGE) 500 MG tablet, Take 2 tabs q am, 1 tab qhs., Disp: 270 tablet, Rfl: 1 .  methadone (DOLOPHINE) 10 MG tablet, Take 20 mg by mouth every 5 (five) hours as needed for severe pain. Reported on 11/15/2015; takes 2am, 2lunch, 1pm, Disp: , Rfl:  .  montelukast (SINGULAIR) 10 MG tablet, TAKE 1 TABLET BY MOUTH EVERYDAY AT BEDTIME, Disp: 90 tablet, Rfl: 1 .  nystatin cream (MYCOSTATIN), Apply 1 application topically 2 (two) times daily., Disp: 30 g, Rfl: 1 .  Omeprazole-Sodium Bicarbonate (ZEGERID PO), Take 1 capsule by mouth daily. , Disp: , Rfl:  .  ondansetron (ZOFRAN) 4 MG tablet, Take 1 tablet (4 mg total) by mouth every 8 (eight) hours as needed for nausea., Disp: 20 tablet, Rfl: 2 .  pimecrolimus (ELIDEL) 1 % cream, Apply topically 2 (two) times daily., Disp: 30 g, Rfl: 1 .  ranitidine (ZANTAC) 150 MG tablet, Take 150 mg by mouth 2 (two) times daily., Disp: , Rfl: 3 .  sertraline (ZOLOFT) 100 MG tablet, TAKE 2 TABLETS BY MOUTH EVERY DAY, Disp: 180 tablet, Rfl: 1 .  tiZANidine (ZANAFLEX) 4 MG capsule, Take 4 mg by mouth 3 (three) times daily., Disp: , Rfl:  .  topiramate (TOPAMAX) 50 MG tablet, Take 25 mg by mouth 4 (four) times daily. 1 tab every morning and 3 tab at bed time, Disp: , Rfl:   Current Facility-Administered Medications:  .  cyanocobalamin ((VITAMIN B-12)) injection 1,000 mcg, 1,000 mcg, Intramuscular, Q14 Days, Koberlein, Junell C, MD, 1,000 mcg at 05/25/18 1244  Review of Systems:  Constitutional: Denies fever, chills, diaphoresis, appetite change and fatigue.  HEENT: Denies photophobia, eye pain, redness, hearing loss, ear pain, congestion, sore throat, rhinorrhea, sneezing, mouth sores, trouble  swallowing, neck pain, neck stiffness and tinnitus.   Respiratory: Denies SOB, DOE, cough, chest tightness,  and wheezing.  Cardiovascular: Denies chest pain, palpitations. Gastrointestinal: Denies nausea, vomiting, abdominal pain, diarrhea, constipation, blood in stool and abdominal distention.  Genitourinary: Denies dysuria, urgency, frequency, hematuria, flank pain and difficulty urinating.  Endocrine: Denies: hot or cold intolerance, sweats, changes in hair or nails, polyuria, polydipsia. Musculoskeletal: Denies myalgias, back pain, joint swelling, arthralgias and gait problem.  Skin: Denies pallor, rash and wound.  Neurological: Denies dizziness, seizures, syncope, weakness, light-headedness, numbness and headaches.  Hematological: Denies adenopathy. Easy bruising, personal or family bleeding history  Psychiatric/Behavioral: Denies suicidal ideation, mood changes, confusion, nervousness, sleep disturbance and agitation    Physical Exam: Vitals:   04/20/19 1104  BP: 110/80  Pulse: 70  Temp: 97.9 F (36.6 C)  TempSrc: Oral  SpO2: 97%  Weight: 164 lb 11.2 oz (74.7 kg)    Body mass index is 28.27 kg/m.   Constitutional: NAD, calm, comfortable Eyes: PERRL, lids and conjunctivae normal ENMT: Mucous membranes are moist.  Respiratory: clear to auscultation bilaterally, no wheezing, no crackles. Normal respiratory effort. No accessory muscle use.  Cardiovascular: Regular rate and rhythm, no murmurs / rubs / gallops. No extremity edema. 2+ pedal pulses. No carotid bruits.  Abdomen: no tenderness, no masses palpated. No hepatosplenomegaly. Bowel sounds positive.  Musculoskeletal: no clubbing / cyanosis. No joint deformity upper and lower extremities. Good ROM, no contractures. Normal muscle tone.  Psychiatric: Normal judgment and insight. Alert and oriented x 3. Normal mood.    Impression and Plan:  Bilateral leg edema  -No edema present today, but she does bring pictures that  show edema from a few days ago. It is bilateral and equal. -Check renal, liver function. -Asked to decrease salt in diet. -Advised compression stockings. -If persists, might benefit from ECHO to eval for CHF.   Patient Instructions  -Nice meeting you today!!  -Compression stockings, knee high during waking hours.  -Lab work today; will notify once results are available.  -F/u with PCP as needed if no improvement.       Lelon Frohlich, MD  Primary Care at [facility]

## 2019-05-01 ENCOUNTER — Telehealth: Payer: Self-pay

## 2019-05-01 MED ORDER — METOPROLOL SUCCINATE ER 25 MG PO TB24: 25.0000 mg | ORAL_TABLET | Freq: Every day | ORAL | 3 refills | Status: DC

## 2019-05-01 NOTE — Telephone Encounter (Signed)
Spoke with pt, aware of dr berry's recommendations. New script sent to the pharmacy

## 2019-05-01 NOTE — Telephone Encounter (Signed)
Pt informed of providers result for coronary calcium. Pt states that she is still having PVC and palpitations. She states that they are happening "all the time" about 2-3 to 10 times daily and sometimes intermittantly "all day long". Pt was wondering if she could have "some sort of rx for this" the pain is pretty bad. Please advise

## 2019-05-01 NOTE — Telephone Encounter (Signed)
Patient is having occasional PACs and PVCs on event monitoring.  Start her on Toprol-XL 25 mg a day

## 2019-05-02 DIAGNOSIS — Z79891 Long term (current) use of opiate analgesic: Secondary | ICD-10-CM | POA: Diagnosis not present

## 2019-05-02 DIAGNOSIS — G894 Chronic pain syndrome: Secondary | ICD-10-CM | POA: Diagnosis not present

## 2019-05-02 DIAGNOSIS — G90512 Complex regional pain syndrome I of left upper limb: Secondary | ICD-10-CM | POA: Diagnosis not present

## 2019-05-02 DIAGNOSIS — M542 Cervicalgia: Secondary | ICD-10-CM | POA: Diagnosis not present

## 2019-05-22 ENCOUNTER — Ambulatory Visit: Payer: PPO | Admitting: Family Medicine

## 2019-06-05 ENCOUNTER — Other Ambulatory Visit: Payer: Self-pay | Admitting: Family Medicine

## 2019-06-06 DIAGNOSIS — G90512 Complex regional pain syndrome I of left upper limb: Secondary | ICD-10-CM | POA: Diagnosis not present

## 2019-06-06 DIAGNOSIS — M542 Cervicalgia: Secondary | ICD-10-CM | POA: Diagnosis not present

## 2019-06-06 DIAGNOSIS — M791 Myalgia, unspecified site: Secondary | ICD-10-CM | POA: Diagnosis not present

## 2019-06-06 DIAGNOSIS — G894 Chronic pain syndrome: Secondary | ICD-10-CM | POA: Diagnosis not present

## 2019-06-06 DIAGNOSIS — Z79891 Long term (current) use of opiate analgesic: Secondary | ICD-10-CM | POA: Diagnosis not present

## 2019-06-12 DIAGNOSIS — G90529 Complex regional pain syndrome I of unspecified lower limb: Secondary | ICD-10-CM | POA: Insufficient documentation

## 2019-06-12 DIAGNOSIS — E119 Type 2 diabetes mellitus without complications: Secondary | ICD-10-CM | POA: Insufficient documentation

## 2019-06-12 DIAGNOSIS — E079 Disorder of thyroid, unspecified: Secondary | ICD-10-CM | POA: Insufficient documentation

## 2019-06-13 ENCOUNTER — Other Ambulatory Visit: Payer: Self-pay

## 2019-06-13 ENCOUNTER — Ambulatory Visit (INDEPENDENT_AMBULATORY_CARE_PROVIDER_SITE_OTHER): Payer: PPO

## 2019-06-13 DIAGNOSIS — Z23 Encounter for immunization: Secondary | ICD-10-CM | POA: Diagnosis not present

## 2019-07-12 ENCOUNTER — Encounter: Payer: Self-pay | Admitting: Family Medicine

## 2019-07-12 ENCOUNTER — Ambulatory Visit (INDEPENDENT_AMBULATORY_CARE_PROVIDER_SITE_OTHER): Payer: PPO | Admitting: Family Medicine

## 2019-07-12 ENCOUNTER — Other Ambulatory Visit: Payer: Self-pay

## 2019-07-12 DIAGNOSIS — J302 Other seasonal allergic rhinitis: Secondary | ICD-10-CM | POA: Diagnosis not present

## 2019-07-12 DIAGNOSIS — K589 Irritable bowel syndrome without diarrhea: Secondary | ICD-10-CM | POA: Diagnosis not present

## 2019-07-12 DIAGNOSIS — G5640 Causalgia of unspecified upper limb: Secondary | ICD-10-CM

## 2019-07-12 DIAGNOSIS — E039 Hypothyroidism, unspecified: Secondary | ICD-10-CM

## 2019-07-12 DIAGNOSIS — F341 Dysthymic disorder: Secondary | ICD-10-CM | POA: Diagnosis not present

## 2019-07-12 DIAGNOSIS — E538 Deficiency of other specified B group vitamins: Secondary | ICD-10-CM | POA: Diagnosis not present

## 2019-07-12 DIAGNOSIS — R12 Heartburn: Secondary | ICD-10-CM | POA: Diagnosis not present

## 2019-07-12 DIAGNOSIS — E282 Polycystic ovarian syndrome: Secondary | ICD-10-CM

## 2019-07-12 DIAGNOSIS — N951 Menopausal and female climacteric states: Secondary | ICD-10-CM

## 2019-07-12 MED ORDER — METOCLOPRAMIDE HCL 10 MG PO TBDP: 10.0000 mg | ORAL_TABLET | Freq: Four times a day (QID) | ORAL | 1 refills | Status: DC

## 2019-07-12 MED ORDER — MONTELUKAST SODIUM 10 MG PO TABS: ORAL_TABLET | ORAL | 1 refills | Status: DC

## 2019-07-12 MED ORDER — METFORMIN HCL 500 MG PO TABS: ORAL_TABLET | ORAL | 1 refills | Status: DC

## 2019-07-12 MED ORDER — BUSPIRONE HCL 15 MG PO TABS: 7.5000 mg | ORAL_TABLET | Freq: Two times a day (BID) | ORAL | 1 refills | Status: DC

## 2019-07-12 MED ORDER — MEDROXYPROGESTERONE ACETATE 2.5 MG PO TABS: ORAL_TABLET | ORAL | 1 refills | Status: DC

## 2019-07-12 MED ORDER — SUCRALFATE 1 G PO TABS: 1.0000 g | ORAL_TABLET | Freq: Three times a day (TID) | ORAL | Status: DC

## 2019-07-12 MED ORDER — ESTRADIOL 2 MG PO TABS: 2.0000 mg | ORAL_TABLET | Freq: Every day | ORAL | 1 refills | Status: DC

## 2019-07-12 MED ORDER — LAMOTRIGINE 100 MG PO TABS: 100.0000 mg | ORAL_TABLET | Freq: Two times a day (BID) | ORAL | 1 refills | Status: DC

## 2019-07-12 MED ORDER — FLUTICASONE PROPIONATE 50 MCG/ACT NA SUSP: 2.0000 | Freq: Every day | NASAL | 2 refills | Status: DC

## 2019-07-12 MED ORDER — AZELASTINE HCL 0.1 % NA SOLN: 1.0000 | Freq: Two times a day (BID) | NASAL | 2 refills | Status: DC

## 2019-07-12 MED ORDER — OMEPRAZOLE-SODIUM BICARBONATE 20-1100 MG PO CAPS: 1.0000 | ORAL_CAPSULE | Freq: Every day | ORAL | 1 refills | Status: DC

## 2019-07-12 MED ORDER — SERTRALINE HCL 100 MG PO TABS: 200.0000 mg | ORAL_TABLET | Freq: Every day | ORAL | 1 refills | Status: DC

## 2019-07-12 MED ORDER — LEVOTHYROXINE SODIUM 125 MCG PO TABS: 125.0000 ug | ORAL_TABLET | Freq: Every day | ORAL | 1 refills | Status: DC

## 2019-07-12 NOTE — Progress Notes (Signed)
Virtual Visit via Video Note  I connected with Jo [name] on 07/12/19 at 10:30 AM EDT by a video enabled telemedicine application and verified that I am speaking with the correct person using two identifiers.  Location patient: home Location provider:work or home office Persons participating in the virtual visit: patient, provider  I discussed the limitations of evaluation and management by telemedicine and the availability of in person appointments. The patient expressed understanding and agreed to proceed.  Unable to connect via virtual  Jo [name] DOB: [birth year] Encounter date: 07/12/2019  This is a 58 y.o. female who presents with Chief Complaint  Patient presents with  . Follow-up    History of present illness: Went through bout of depression - feels related to be stuck inside. Was also hurting a lot. Lasted for a couple of weeks and then was able to pull self out of this. Tried to keep self busy, keep moving which helped her.  Has been having some ear aches. Thinks related to allergies - more right ear. Have been really bad this year and doesn't feel like it is helping. Using flonase, allergra, astellin, singulair but not getting relief. Not using inhalers but asthma worse with allergy flare.  Saw Dr. Gwenlyn Found - she is still having palpitations. Sometimes last for hours, sometimes days. Doesn't feel like medicine has helped her that they started (metoprolol 25). Has had several falls since last visit. Stopped metoprolol since not helping. Even fell off toilet - went head first on to floor. Doesn't get warning. This is first time it happened sitting. Other times has been with standing or walking. Was more constant when she saw providers in past for this. Patient feels that CRPS is now affecting her heart.  Thinks on one fall she may have hurt rotator cuff. Hurting on right side. This fall was back in March. Causing significant pain.   Has dealt with CRPS for over 30 years. States  now it has really attacked [name] of her body.    Allergies  Allergen Reactions  . Bee Pollen Anaphylaxis  . Diazepam     sucidal depression  . Prednisone Nausea And Vomiting    Per pt can not take oral steroids   . Amoxicillin     Severe diarrhea At high dose   . Doxycycline     Pt does not remember reaction  . Erythromycin     n & v  . Morphine     n & v  . Propoxyphene N-Acetaminophen     n & V  . Sulfa Antibiotics     MIGRAINES  . Tetracyclines & Related     N & v  . Propoxyphene Other (See Comments)   Current Meds  Medication Sig  . azelastine (ASTELIN) 0.1 % nasal spray Place 1 spray into both nostrils 2 (two) times daily. Use in each nostril as directed  . busPIRone (BUSPAR) 15 MG tablet Take 0.5 tablets (7.5 mg total) by mouth 2 (two) times daily.  . cholecalciferol (VITAMIN D) 1000 UNITS tablet Take 2,000 Units by mouth daily.  . Cranberry 200 MG CAPS Take 200 mg by mouth 2 (two) times daily.  . cyanocobalamin (,VITAMIN B-12,) 1000 MCG/ML injection INJECT 1 ML INTO THE MUSCLE ONCE FOR 1 DOSE.  Marland Kitchen erythromycin with ethanol (EMGEL) 2 % gel Apply topically 2 (two) times daily.  Marland Kitchen ESTRADIOL ACETATE PO Take 2 mg by mouth daily. 2 mg tablet  . fexofenadine-pseudoephedrine (ALLEGRA-D 24) 180-240 MG 24 hr  tablet Take 1 tablet by mouth at bedtime.  . fluticasone (FLONASE) 50 MCG/ACT nasal spray Place 2 sprays into both nostrils daily.  Marland Kitchen HYDROmorphone (DILAUDID) 4 MG tablet Take 4 mg by mouth every 4 (four) hours as needed.  . lamoTRIgine (LAMICTAL) 100 MG tablet Take 1 tablet (100 mg total) by mouth 2 (two) times daily.  Marland Kitchen levothyroxine (SYNTHROID) 125 MCG tablet Take 1 tablet (125 mcg total) by mouth daily.  . medroxyPROGESTERone (PROVERA) 2.5 MG tablet 1 tab alternating with 2 tabs daily  . metFORMIN (GLUCOPHAGE) 500 MG tablet Take 2 tabs q am, 1 tab qhs.  . methadone (DOLOPHINE) 10 MG tablet Take 20 mg by mouth every 5 (five) hours as needed for severe pain. Reported on  11/15/2015; takes 2am, 2lunch, 1pm  . montelukast (SINGULAIR) 10 MG tablet TAKE 1 TABLET BY MOUTH EVERYDAY AT BEDTIME  . nystatin cream (MYCOSTATIN) Apply 1 application topically 2 (two) times daily.  . ondansetron (ZOFRAN) 4 MG tablet Take 1 tablet (4 mg total) by mouth every 8 (eight) hours as needed for nausea.  . sertraline (ZOLOFT) 100 MG tablet Take 2 tablets (200 mg total) by mouth daily.  Marland Kitchen tiZANidine (ZANAFLEX) 4 MG capsule Take 4 mg by mouth 3 (three) times daily.  Marland Kitchen topiramate (TOPAMAX) 50 MG tablet Take 25 mg by mouth 4 (four) times daily. 1 tab every morning and 3 tab at bed time  . [DISCONTINUED] acyclovir ointment (ZOVIRAX) 5 % Apply 1 application topically every 3 (three) hours.  . [DISCONTINUED] azelastine (ASTELIN) 0.1 % nasal spray Place 1 spray into both nostrils 2 (two) times daily. Use in each nostril as directed  . [DISCONTINUED] busPIRone (BUSPAR) 15 MG tablet TAKE 1/2 TABLET BY MOUTH TWICE A DAY  . [DISCONTINUED] fluticasone (FLONASE) 50 MCG/ACT nasal spray Place 2 sprays into both nostrils daily.  . [DISCONTINUED] lamoTRIgine (LAMICTAL) 100 MG tablet Take 1 tablet (100 mg total) by mouth 2 (two) times daily.  . [DISCONTINUED] levothyroxine (SYNTHROID) 125 MCG tablet Take 1 tablet (125 mcg total) by mouth daily.  . [DISCONTINUED] medroxyPROGESTERone (PROVERA) 2.5 MG tablet Take 2.5 mg by mouth. 1 tab every other day  . [DISCONTINUED] metFORMIN (GLUCOPHAGE) 500 MG tablet Take 2 tabs q am, 1 tab qhs.  . [DISCONTINUED] metoprolol succinate (TOPROL XL) 25 MG 24 hr tablet Take 1 tablet (25 mg total) by mouth daily.  . [DISCONTINUED] montelukast (SINGULAIR) 10 MG tablet TAKE 1 TABLET BY MOUTH EVERYDAY AT BEDTIME  . [DISCONTINUED] Omeprazole-Sodium Bicarbonate (ZEGERID PO) Take 1 capsule by mouth daily.   . [DISCONTINUED] pimecrolimus (ELIDEL) 1 % cream Apply topically 2 (two) times daily.  . [DISCONTINUED] ranitidine (ZANTAC) 150 MG tablet Take 150 mg by mouth 2 (two) times  daily.  . [DISCONTINUED] sertraline (ZOLOFT) 100 MG tablet TAKE 2 TABLETS BY MOUTH EVERY DAY   Current Facility-Administered Medications for the 07/12/19 encounter (Office Visit) with Caren Macadam, MD  Medication  . cyanocobalamin ((VITAMIN B-12)) injection 1,000 mcg    Review of Systems  Constitutional: Negative for chills, fatigue and fever.  HENT: Positive for congestion, postnasal drip and sinus pressure. Negative for ear pain.   Respiratory: Negative for cough, chest tightness, shortness of breath and wheezing.   Cardiovascular: Negative for chest pain, palpitations and leg swelling (better).    Objective:  There were no vitals taken for this visit.      BP Readings from Last 3 Encounters:  04/20/19 110/80  12/21/18 110/80  11/28/18 118/72   Wt  Readings from Last 3 Encounters:  04/20/19 164 lb 11.2 oz (74.7 kg)  11/28/18 154 lb 8 oz (70.1 kg)  05/25/18 153 lb 11.2 oz (69.7 kg)    EXAM:  GENERAL: Sounds alert, oriented, and in no acute distress  LUNGS: No shortness of breath and discussion on the phone.  PSYCH/NEURO: pleasant and cooperative, no obvious depression or anxiety, speech and thought processing grossly intact   Assessment/Plan  1. Complex regional pain syndrome type 2, affecting unspecified site I encouraged her to look at [facility] of health and CDC databases to search for centers that might be doing some testing or research studies that she may be a candidate for.  She has had this diagnosis for very long time and it affects her significantly every day.  She is seeing a specialist for pain management.  2. Acquired hypothyroidism - levothyroxine (SYNTHROID) 125 MCG tablet; Take 1 tablet (125 mcg total) by mouth daily.  Dispense: 90 tablet; Refill: 1  3. Polycystic ovaries - metFORMIN (GLUCOPHAGE) 500 MG tablet; Take 2 tabs q am, 1 tab qhs.  Dispense: 270 tablet; Refill: 1  4. Vitamin B 12 deficiency She has done well with B12  replacement.  5. Heartburn - Omeprazole-Sodium Bicarbonate (ZEGERID) 20-1100 MG CAPS capsule; Take 1 capsule by mouth daily.  Dispense: 90 capsule; Refill: 1  6. Seasonal allergies Allergies are poorly controlled even on 2 nasal sprays, Singulair, and antihistamine.  She would like to give herself allergy injections, but I am not certain if this is possible with any providers in the area.  She did this in the past.  I will send a message to see if we can locate a provider who would be comfortable with this process. - montelukast (SINGULAIR) 10 MG tablet; TAKE 1 TABLET BY MOUTH EVERYDAY AT BEDTIME  Dispense: 90 tablet; Refill: 1 - fluticasone (FLONASE) 50 MCG/ACT nasal spray; Place 2 sprays into both nostrils daily.  Dispense: 16 g; Refill: 2 - azelastine (ASTELIN) 0.1 % nasal spray; Place 1 spray into both nostrils 2 (two) times daily. Use in each nostril as directed  Dispense: 30 mL; Refill: 2  7. Menopausal symptoms - estradiol (ESTRACE) 2 MG tablet; Take 1 tablet (2 mg total) by mouth daily.  Dispense: 90 tablet; Refill: 1 - medroxyPROGESTERone (PROVERA) 2.5 MG tablet; 1 tab alternating with 2 tabs daily  Dispense: 135 tablet; Refill: 1  8. DEPRESSION/ANXIETY - lamoTRIgine (LAMICTAL) 100 MG tablet; Take 1 tablet (100 mg total) by mouth 2 (two) times daily.  Dispense: 180 tablet; Refill: 1 - busPIRone (BUSPAR) 15 MG tablet; Take 0.5 tablets (7.5 mg total) by mouth 2 (two) times daily.  Dispense: 90 tablet; Refill: 1 - sertraline (ZOLOFT) 100 MG tablet; Take 2 tablets (200 mg total) by mouth daily.  Dispense: 180 tablet; Refill: 1  9. Irritable bowel syndrome, unspecified type - Metoclopramide HCl 10 MG TBDP; Take 1 tablet (10 mg total) by mouth 4 (four) times daily.  Dispense: 360 tablet; Refill: 1 - Omeprazole-Sodium Bicarbonate (ZEGERID) 20-1100 MG CAPS capsule; Take 1 capsule by mouth daily.  Dispense: 90 capsule; Refill: 1 - sucralfate (CARAFATE) 1 g tablet; Take 1 tablet (1 g total) by  mouth 4 (four) times daily -  with meals and at bedtime.  Dispense:    Return in about 3 months (around 10/12/2019) for Chronic condition visit; in office so we can do bloodwork. All medications were updated today.  Previous med list was not completely accurate as she was still getting  some medications from previous provider.   I discussed the assessment and treatment plan with the patient. The patient was provided an opportunity to ask questions and all were answered. The patient agreed with the plan and demonstrated an understanding of the instructions.   The patient was advised to call back or seek an in-person evaluation if the symptoms worsen or if the condition fails to improve as anticipated.  I provided 35 minutes of non-face-to-face time during this encounter.   Micheline Rough, MD

## 2019-07-13 ENCOUNTER — Telehealth: Payer: Self-pay | Admitting: *Deleted

## 2019-07-13 NOTE — Telephone Encounter (Signed)
-----   Message from Caren Macadam, MD sent at 07/12/2019 11:19 AM EDT ----- Are there any allergy specialists that allow patients to give themselves injections? She did this in past, but asked at [facility] and they wouldn't do this. Also please set her up for a 3 month follow up visit.

## 2019-07-13 NOTE — Telephone Encounter (Signed)
I called the pt and informed her I am not aware of any allergy office that allows patients to give themselves injections as this is something they would need to monitor.  Appt scheduled for 10/18/19.

## 2019-09-22 DIAGNOSIS — I499 Cardiac arrhythmia, unspecified: Secondary | ICD-10-CM

## 2019-09-22 DIAGNOSIS — M329 Systemic lupus erythematosus, unspecified: Secondary | ICD-10-CM

## 2019-09-22 HISTORY — DX: Cardiac arrhythmia, unspecified: I49.9

## 2019-09-22 HISTORY — DX: Systemic lupus erythematosus, unspecified: M32.9

## 2019-09-28 ENCOUNTER — Other Ambulatory Visit: Payer: Self-pay | Admitting: Family Medicine

## 2019-09-28 DIAGNOSIS — J302 Other seasonal allergic rhinitis: Secondary | ICD-10-CM

## 2019-10-18 ENCOUNTER — Ambulatory Visit: Payer: PPO | Admitting: Family Medicine

## 2019-10-18 DIAGNOSIS — M542 Cervicalgia: Secondary | ICD-10-CM | POA: Diagnosis not present

## 2019-10-18 DIAGNOSIS — G894 Chronic pain syndrome: Secondary | ICD-10-CM | POA: Diagnosis not present

## 2019-10-18 DIAGNOSIS — G90512 Complex regional pain syndrome I of left upper limb: Secondary | ICD-10-CM | POA: Diagnosis not present

## 2019-10-18 DIAGNOSIS — M791 Myalgia, unspecified site: Secondary | ICD-10-CM | POA: Diagnosis not present

## 2019-10-31 DIAGNOSIS — R208 Other disturbances of skin sensation: Secondary | ICD-10-CM | POA: Diagnosis not present

## 2019-10-31 DIAGNOSIS — L820 Inflamed seborrheic keratosis: Secondary | ICD-10-CM | POA: Diagnosis not present

## 2019-10-31 DIAGNOSIS — L718 Other rosacea: Secondary | ICD-10-CM | POA: Diagnosis not present

## 2019-11-01 ENCOUNTER — Telehealth: Payer: Self-pay | Admitting: Family Medicine

## 2019-11-01 NOTE — Telephone Encounter (Signed)
Specialist should be able to fax note or if he would like to talk I'm happy to talk with him. Please see if you can get name of specialist and see if we are able to talk or get him to send over office note? I'm happy to facilitate labwork for evaluation for addison's but want to make sure there is nothing else we are missing/needing.   If she gets steroid injection it can affect bloodwork for the addisons; so I'm not sure what they were giving for, but if she is feeling ok now and it is not urgent, I would hold off on this. If she isn't feeling well curently let me know.

## 2019-11-01 NOTE — Telephone Encounter (Signed)
The patient is needing to make an appointment ASAP she was told by one of her specialist that she needs to discuss Addison's Disease with you.  ?     She also went to her dermatologist yesterday and was supposed to get a steroid shot for her face being broke out and they forgot to give it to her. She  was wanting to know if she can come her to get the steroid shot instead of driving 20 minutes to go back to their office.  Please advise

## 2019-11-01 NOTE — Telephone Encounter (Signed)
Her specialist is in [place] and she don't know if she can get records from his office because he is a private owned office. This specialist did a telephone visit with her for her syptoms and wants her to talk with her provider and see about getting blood work done. Because if she does have this Addison's Disease she needs to be treated for it soon before she has another episode and dies from it.  She's having episodes that she is really cold, her feet and hands turn blue, and she gets so cold her teeth chatter and really tired all the sudden and crawls in to bed with heating pads and exhausted all the time. This happens 3 or 4 times a month.   She called her dermatologist and waiting on them to call her back. They forgot to give her the steroid shot yesterday at her visit.   Cell phone number [phone]  Please advise

## 2019-11-01 NOTE — Telephone Encounter (Signed)
Can you see if you can get specialty notes/labs? I don't have anything at all. I would like to see derm note/check with them about steroid shot. If they were going to give this, we could help her out. Not sure if there was reason they decided against it? And I need to know who she saw regarding Addison's disease comment? If she had bloodwork or evaluation please see if we can get this today so I can address.

## 2019-11-02 NOTE — Telephone Encounter (Signed)
Spoke with the pt and informed her of the message below.   Patient stated she was seen by Dr Sanjuan Dame [phone].  I called Dr Jaci Lazier office and left a detailed message stating Dr Ethlyn Gallery would like the office notes or if he needs to speak with her to call our office.  Patient stated the dermatologist was going to give the injection for a severe rash she has on her face and neck and it is still the same, felt worse yesterday after using two prescriptions she was given by their office.  I advised the pt to call the dermatology office to inform them of her symptoms and she agreed.  Message sent to PCP.

## 2019-11-03 ENCOUNTER — Other Ambulatory Visit: Payer: Self-pay | Admitting: Family Medicine

## 2019-11-03 DIAGNOSIS — L239 Allergic contact dermatitis, unspecified cause: Secondary | ICD-10-CM | POA: Diagnosis not present

## 2019-11-03 DIAGNOSIS — R7989 Other specified abnormal findings of blood chemistry: Secondary | ICD-10-CM

## 2019-11-03 DIAGNOSIS — E2740 Unspecified adrenocortical insufficiency: Secondary | ICD-10-CM

## 2019-11-03 NOTE — Telephone Encounter (Signed)
I left a detailed message at Dr Jaci Lazier office to return a call to Dr Ethlyn Gallery as below.  Left a message for the pt to return my call.

## 2019-11-03 NOTE — Telephone Encounter (Signed)
Patient called back and was informed of the message below and is aware she will be called back once Dr Ethlyn Gallery talks with the specialist.  St[facility] dermatology office gave her a topical steroid cream instead of an injection this AM in the office.  Patient requests appt with Dr Chalmers Cater as just now recalled she was seen by her previously for thyroid issues and changed due to insurance at that time.  Message sent to PCP.

## 2019-11-03 NOTE — Telephone Encounter (Signed)
I didn't see any notes and didn't get return call. Can you check with office today to see if he is in and can talk? I'm happy to speak with him whenever available or if he feels office notes suffice please ask again to send to [place].   Please let Marquerite know we will work on this today; and hopefully be able to get back with her. 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 Please find out if she had steroid injection from dermatology? As stated before this may change timeline for bloodwork evaluation for addison's. Please make sure not on additional meds from derm as well.

## 2019-11-03 NOTE — Telephone Encounter (Signed)
Message sent as fyi  I tried to call Dr. Jaci Lazier office myself and did leave message. I haven't heard from him or received visit notes.   I placed urgent referral to Dr. Chalmers Cater.   She previously saw Dr. Chalmers Cater; had to transfer care due to insruance change. Other endo told her "I can't help you because your labs are all over the place". She states she has had weight loss, nausea, spells in evening getting extremely cold, shaking, fatigue. States that previously recalls being told low cortisol.   Feels weak and tired all the time, but now worse. No spells now.   Patient will call to see when Dr. Chalmers Cater can see her. She will let me know so that I can order bloodwork for her if wait time is prolonged. She is due anyway for routine labs and visit.

## 2019-11-06 ENCOUNTER — Telehealth: Payer: Self-pay | Admitting: Family Medicine

## 2019-11-06 NOTE — Telephone Encounter (Signed)
Pt wanted to let Dr. Ethlyn Gallery know that Dr. Almetta Lovely office stated that they need the referral in hand before they can make any appointment.  She had spoke to that office 10 minutes before she called our office.  Pt state that she has Tuesday and Wednesday free if Dr. Ethlyn Gallery would like to do any testing.  Reason for the pt calling she stated that Dr. Ethlyn Gallery wanted her to call to let her know if she got the appointment with Dr. Almetta Lovely office.

## 2019-11-07 ENCOUNTER — Telehealth: Payer: Self-pay | Admitting: Family Medicine

## 2019-11-07 NOTE — Telephone Encounter (Signed)
The patient called in to let Dr. Ethlyn Gallery know that was able to get in to see the specialist on 11/09/2019 at 7:15 AM. She said that Dr. Ethlyn Gallery told the patient to let her know when she was able to get into with the other doctor just in case she needs to put in orders or have more tests done before she see the specialist.  Please advise

## 2019-11-08 NOTE — Telephone Encounter (Signed)
Spoke with the pt and informed her of the message below.   

## 2019-11-08 NOTE — Telephone Encounter (Signed)
See prior note

## 2019-11-08 NOTE — Telephone Encounter (Signed)
Sorry I didn't see this message before the last. Glad she got in; we can just wait to see what endo orders at this point; just remind her to have Dr. Chalmers Cater forward note, labs to [place].

## 2019-11-08 NOTE — Telephone Encounter (Signed)
Please check w patient/deb and see if she was able to schedule. I did enter referral last week for her. If she is able to do labs thurs or Friday I can put in orders today (assuming Dr. Chalmers Cater cannot see her in the next week). She will need early morning appointment so please let me know if possible so I can put in labs. Thanks!

## 2019-11-10 ENCOUNTER — Telehealth: Payer: Self-pay | Admitting: Family Medicine

## 2019-11-10 NOTE — Telephone Encounter (Signed)
Pt wanted to inform Dr. Ethlyn Gallery that her specialist has her coming in on March 18th canceled the first appt because of the bad weather. Pt also would like a Telephone appt to discuss another auto immune disease she was told she may have.

## 2019-11-13 NOTE — Telephone Encounter (Signed)
Ok; since moved a ways out, I will go ahead and order some bloodwork on her today. Please see if you can schedule early morning appointment for her to this week to complete. If I need to give any specific directions for this I will send another message later.

## 2019-11-13 NOTE — Telephone Encounter (Signed)
Spoke with the pt and informed her of the message below.  Lab appt scheduled for 2/24.

## 2019-11-14 ENCOUNTER — Other Ambulatory Visit: Payer: Self-pay

## 2019-11-14 ENCOUNTER — Other Ambulatory Visit: Payer: Self-pay | Admitting: Family Medicine

## 2019-11-14 DIAGNOSIS — E538 Deficiency of other specified B group vitamins: Secondary | ICD-10-CM

## 2019-11-14 DIAGNOSIS — Z13220 Encounter for screening for lipoid disorders: Secondary | ICD-10-CM

## 2019-11-14 DIAGNOSIS — R5383 Other fatigue: Secondary | ICD-10-CM

## 2019-11-14 DIAGNOSIS — E039 Hypothyroidism, unspecified: Secondary | ICD-10-CM

## 2019-11-14 DIAGNOSIS — G9059 Complex regional pain syndrome I of other specified site: Secondary | ICD-10-CM

## 2019-11-14 DIAGNOSIS — R739 Hyperglycemia, unspecified: Secondary | ICD-10-CM

## 2019-11-14 DIAGNOSIS — R6889 Other general symptoms and signs: Secondary | ICD-10-CM

## 2019-11-15 ENCOUNTER — Other Ambulatory Visit (INDEPENDENT_AMBULATORY_CARE_PROVIDER_SITE_OTHER): Payer: PPO

## 2019-11-15 DIAGNOSIS — R739 Hyperglycemia, unspecified: Secondary | ICD-10-CM | POA: Diagnosis not present

## 2019-11-15 DIAGNOSIS — Z13220 Encounter for screening for lipoid disorders: Secondary | ICD-10-CM

## 2019-11-15 DIAGNOSIS — E538 Deficiency of other specified B group vitamins: Secondary | ICD-10-CM | POA: Diagnosis not present

## 2019-11-15 DIAGNOSIS — R5383 Other fatigue: Secondary | ICD-10-CM | POA: Diagnosis not present

## 2019-11-15 DIAGNOSIS — E039 Hypothyroidism, unspecified: Secondary | ICD-10-CM

## 2019-11-15 LAB — CBC WITH DIFFERENTIAL/PLATELET
Basophils Absolute: 0 10*3/uL (ref 0.0–0.1)
Basophils Relative: 1 % (ref 0.0–3.0)
Eosinophils Absolute: 0.2 10*3/uL (ref 0.0–0.7)
Eosinophils Relative: 5.1 % — ABNORMAL HIGH (ref 0.0–5.0)
HCT: 37.4 % (ref 36.0–46.0)
Hemoglobin: 12.2 g/dL (ref 12.0–15.0)
Lymphocytes Relative: 43.9 % (ref 12.0–46.0)
Lymphs Abs: 2.1 10*3/uL (ref 0.7–4.0)
MCHC: 32.6 g/dL (ref 30.0–36.0)
MCV: 89.8 fl (ref 78.0–100.0)
Monocytes Absolute: 0.4 10*3/uL (ref 0.1–1.0)
Monocytes Relative: 8.6 % (ref 3.0–12.0)
Neutro Abs: 2 10*3/uL (ref 1.4–7.7)
Neutrophils Relative %: 41.4 % — ABNORMAL LOW (ref 43.0–77.0)
Platelets: 248 10*3/uL (ref 150.0–400.0)
RBC: 4.16 Mil/uL (ref 3.87–5.11)
RDW: 13.4 % (ref 11.5–15.5)
WBC: 4.9 10*3/uL (ref 4.0–10.5)

## 2019-11-15 LAB — IBC + FERRITIN
Ferritin: 8.1 ng/mL — ABNORMAL LOW (ref 10.0–291.0)
Iron: 61 ug/dL (ref 42–145)
Saturation Ratios: 13.2 % — ABNORMAL LOW (ref 20.0–50.0)
Transferrin: 329 mg/dL (ref 212.0–360.0)

## 2019-11-15 LAB — COMPREHENSIVE METABOLIC PANEL
ALT: 5 U/L (ref 0–35)
AST: 14 U/L (ref 0–37)
Albumin: 4.1 g/dL (ref 3.5–5.2)
Alkaline Phosphatase: 54 U/L (ref 39–117)
BUN: 12 mg/dL (ref 6–23)
CO2: 24 mEq/L (ref 19–32)
Calcium: 9.4 mg/dL (ref 8.4–10.5)
Chloride: 105 mEq/L (ref 96–112)
Creatinine, Ser: 0.86 mg/dL (ref 0.40–1.20)
GFR: 68.2 mL/min (ref >60.00)
Glucose, Bld: 119 mg/dL — ABNORMAL HIGH (ref 70–99)
Potassium: 3.9 mEq/L (ref 3.5–5.1)
Sodium: 138 mEq/L (ref 135–145)
Total Bilirubin: 0.2 mg/dL (ref 0.2–1.2)
Total Protein: 6.4 g/dL (ref 6.0–8.3)

## 2019-11-15 LAB — LIPID PANEL
Cholesterol: 175 mg/dL (ref 0–200)
HDL: 75 mg/dL (ref >39.00)
LDL Cholesterol: 74 mg/dL (ref 0–99)
NonHDL: 100.33
Total CHOL/HDL Ratio: 2
Triglycerides: 130 mg/dL (ref 0.0–149.0)
VLDL: 26 mg/dL (ref 0.0–40.0)

## 2019-11-15 LAB — VITAMIN B12: Vitamin B-12: 427 pg/mL (ref 211–911)

## 2019-11-15 LAB — FOLATE: Folate: 6 ng/mL (ref >5.9)

## 2019-11-15 LAB — CORTISOL: Cortisol, Plasma: 8.2 ug/dL

## 2019-11-15 LAB — TSH: TSH: 2.19 u[IU]/mL (ref 0.35–4.50)

## 2019-11-16 LAB — HEMOGLOBIN A1C: Hgb A1c MFr Bld: 5.6 % (ref 4.6–6.5)

## 2019-11-17 LAB — ACTH: C206 ACTH: 30 pg/mL (ref 6–50)

## 2019-11-28 DIAGNOSIS — L718 Other rosacea: Secondary | ICD-10-CM | POA: Diagnosis not present

## 2019-11-29 ENCOUNTER — Other Ambulatory Visit: Payer: Self-pay

## 2019-11-29 ENCOUNTER — Ambulatory Visit (INDEPENDENT_AMBULATORY_CARE_PROVIDER_SITE_OTHER): Payer: PPO | Admitting: Family Medicine

## 2019-11-29 ENCOUNTER — Telehealth: Payer: Self-pay | Admitting: *Deleted

## 2019-11-29 ENCOUNTER — Encounter: Payer: Self-pay | Admitting: Family Medicine

## 2019-11-29 VITALS — BP 90/60 | HR 77 | Temp 98.1°F | Ht 64.0 in | Wt 170.2 lb

## 2019-11-29 DIAGNOSIS — J4520 Mild intermittent asthma, uncomplicated: Secondary | ICD-10-CM | POA: Diagnosis not present

## 2019-11-29 DIAGNOSIS — E039 Hypothyroidism, unspecified: Secondary | ICD-10-CM

## 2019-11-29 DIAGNOSIS — F341 Dysthymic disorder: Secondary | ICD-10-CM

## 2019-11-29 DIAGNOSIS — G9059 Complex regional pain syndrome I of other specified site: Secondary | ICD-10-CM | POA: Diagnosis not present

## 2019-11-29 DIAGNOSIS — K219 Gastro-esophageal reflux disease without esophagitis: Secondary | ICD-10-CM

## 2019-11-29 DIAGNOSIS — R5383 Other fatigue: Secondary | ICD-10-CM

## 2019-11-29 DIAGNOSIS — E611 Iron deficiency: Secondary | ICD-10-CM

## 2019-11-29 LAB — SEDIMENTATION RATE: Sed Rate: 16 mm/hr (ref 0–30)

## 2019-11-29 LAB — C-REACTIVE PROTEIN: CRP: <1 mg/dL (ref 0.5–20.0)

## 2019-11-29 MED ORDER — CYANOCOBALAMIN 1000 MCG/ML IJ SOLN: INTRAMUSCULAR | 1 refills | Status: DC

## 2019-11-29 MED ORDER — "BD ECLIPSE SYRINGE 25G X 5/8"" 3 ML MISC": 0 refills | Status: DC

## 2019-11-29 NOTE — Progress Notes (Signed)
Jo [name] DOB: 16-Dec-1960 Encounter date: 11/29/2019  This is a 59 y.o. female who presents with Chief Complaint  Patient presents with  . Follow-up    History of present illness: Feeling really tired all the time. Really red, face breaking out. Told she has rosacea.   Just hasn't felt well; feels like she is going to pas out. Sometimes joint pain, but really body hurts all over.   Has not tolerated iron in the past; causes constipation.   Endocrinology visit in next week.   Ringing in ears; like background noise. Has been coming and going in last few years; worse with allergies; maybe some fluid in ears.   Has found self sitting down and not knowing how long she has been there (ie on toilet and legs numb and it has been a couple of hours).   Skin dry, feels that hair is falling out. Having to pull hair to cover areas of hair loss.   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   Hypothyroid: stable on recent bloodwork.   GERD:for most part ok, but does have some breakthrough occasionally.  Depression/anxiety:surprisingly good in spite of feeling unwell. Hasn't been depressed.   Asthma: has been a little more out of breath lately. Coughing. Not wheezing. Occasionally productive. This is normal for her. Doesn't use inhaler because they don't help and make her shaky.    folowing with Dr. Gwenlyn Found - had CT cardiac scoring 03/2019: wnl  Last mammogram 05/2018   Cold spells in evening; just incredible cold spells; shaking, teeth chattering, put heating blanket on bed, heating pads. Can't get self warm even in hot shower. Takes 30-45 min to get warm and fatigue afterwards last several hours. Has had night sweats, but doesn't sweat with these episodes. Not having fevers. Really can't function at all during these episodes. Doesn't happen every night; but happening more than once monthly. These episodes started about a year or longer ago.    Allergies  Allergen Reactions  . Bee Pollen  Anaphylaxis  . Diazepam     sucidal depression  . Prednisone Nausea And Vomiting    Per pt can not take oral steroids   . Amoxicillin     Severe diarrhea At high dose   . Doxycycline     Pt does not remember reaction  . Erythromycin     n & v  . Morphine     n & v  . Propoxyphene N-Acetaminophen     n & V  . Sulfa Antibiotics     MIGRAINES  . Tetracyclines & Related     N & v  . Propoxyphene Other (See Comments)   Current Meds  Medication Sig  . azelastine (ASTELIN) 0.1 % nasal spray Place 1 spray into both nostrils 2 (two) times daily. Use in each nostril as directed  . busPIRone (BUSPAR) 15 MG tablet Take 0.5 tablets (7.5 mg total) by mouth 2 (two) times daily.  . cholecalciferol (VITAMIN D) 1000 UNITS tablet Take 2,000 Units by mouth daily.  . clindamycin (CLEOCIN T) 1 % lotion Apply BID to face  . Cranberry 200 MG CAPS Take 200 mg by mouth 2 (two) times daily.  . cyanocobalamin (,VITAMIN B-12,) 1000 MCG/ML injection INJECT 1 ML INTO THE MUSCLE ONCE FOR 1 DOSE.  Marland Kitchen erythromycin with ethanol (EMGEL) 2 % gel Apply topically 2 (two) times daily.  Marland Kitchen estradiol (ESTRACE) 2 MG tablet Take 1 tablet (2 mg total) by mouth daily.  Marland Kitchen ESTRADIOL ACETATE  PO Take 2 mg by mouth daily. 2 mg tablet  . fexofenadine-pseudoephedrine (ALLEGRA-D 24) 180-240 MG 24 hr tablet Take 1 tablet by mouth at bedtime.  . fluticasone (CUTIVATE) 0.05 % cream Apply BID to affected areas on face 1 week on 1 week off as needed only  . fluticasone (FLONASE) 50 MCG/ACT nasal spray SPRAY 2 SPRAYS INTO EACH NOSTRIL EVERY DAY  . HYDROmorphone (DILAUDID) 4 MG tablet Take 4 mg by mouth every 4 (four) hours as needed.  . lamoTRIgine (LAMICTAL) 100 MG tablet Take 1 tablet (100 mg total) by mouth 2 (two) times daily.  Marland Kitchen levothyroxine (SYNTHROID) 125 MCG tablet Take 1 tablet (125 mcg total) by mouth daily.  . medroxyPROGESTERone (PROVERA) 2.5 MG tablet 1 tab alternating with 2 tabs daily  . metFORMIN (GLUCOPHAGE) 500 MG  tablet Take 2 tabs q am, 1 tab qhs.  . methadone (DOLOPHINE) 10 MG tablet Take 20 mg by mouth every 5 (five) hours as needed for severe pain. Reported on 11/15/2015; takes 2am, 2lunch, 1pm  . Metoclopramide HCl 10 MG TBDP Take 1 tablet (10 mg total) by mouth 4 (four) times daily.  . montelukast (SINGULAIR) 10 MG tablet TAKE 1 TABLET BY MOUTH EVERYDAY AT BEDTIME  . nystatin cream (MYCOSTATIN) Apply 1 application topically 2 (two) times daily.  Earney Navy Bicarbonate (ZEGERID) 20-1100 MG CAPS capsule Take 1 capsule by mouth daily.  . ondansetron (ZOFRAN) 4 MG tablet Take 1 tablet (4 mg total) by mouth every 8 (eight) hours as needed for nausea.  . sertraline (ZOLOFT) 100 MG tablet Take 2 tablets (200 mg total) by mouth daily.  . sucralfate (CARAFATE) 1 g tablet Take 1 tablet (1 g total) by mouth 4 (four) times daily -  with meals and at bedtime.  Marland Kitchen tiZANidine (ZANAFLEX) 4 MG capsule Take 4 mg by mouth 3 (three) times daily.  Marland Kitchen topiramate (TOPAMAX) 50 MG tablet Take 25 mg by mouth 4 (four) times daily. 1 tab every morning and 3 tab at bed time   Current Facility-Administered Medications for the 11/29/19 encounter (Office Visit) with Caren Macadam, MD  Medication  . cyanocobalamin ((VITAMIN B-12)) injection 1,000 mcg    Review of Systems  Constitutional: Positive for appetite change and fatigue. Negative for chills and fever.  HENT: Positive for congestion.   Respiratory: Negative for cough, chest tightness, shortness of breath and wheezing.   Cardiovascular: Negative for chest pain, palpitations and leg swelling.  Gastrointestinal: Positive for abdominal pain.  Endocrine: Positive for cold intolerance.  Skin: Positive for rash (on face).  Neurological: Positive for dizziness.    Objective:  BP 90/60 (BP Location: Right Arm, Patient Position: Sitting, Cuff Size: Normal)   Pulse 77   Temp 98.1 F (36.7 C) (Temporal)   Ht 5\' 4"  (1.626 m)   Wt 170 lb 3.2 oz (77.2 kg)   SpO2  97%   BMI 29.21 kg/m   Weight: 170 lb 3.2 oz (77.2 kg)   BP Readings from Last 3 Encounters:  11/29/19 90/60  04/20/19 110/80  12/21/18 110/80   Wt Readings from Last 3 Encounters:  11/29/19 170 lb 3.2 oz (77.2 kg)  04/20/19 164 lb 11.2 oz (74.7 kg)  11/28/18 154 lb 8 oz (70.1 kg)    Physical Exam Constitutional:      General: She is not in acute distress.    Appearance: She is well-developed.  Neck:     Thyroid: No thyroid mass, thyromegaly or thyroid tenderness.  Cardiovascular:  Rate and Rhythm: Normal rate and regular rhythm.     Heart sounds: Normal heart sounds. No murmur. No friction rub.  Pulmonary:     Effort: Pulmonary effort is normal. No respiratory distress.     Breath sounds: Normal breath sounds. No wheezing or rales.  Musculoskeletal:     Right lower leg: No edema.     Left lower leg: No edema.  Skin:    Comments: Erythema involving cheeks, nose. No blisters, pustules noted.  Neurological:     Mental Status: She is alert and oriented to person, place, and time.  Psychiatric:        Behavior: Behavior normal.     Assessment/Plan  1. Gastroesophageal reflux disease, unspecified whether esophagitis present Stable with rare flares. Continues with omeprazole.  2. Acquired hypothyroidism Has been stable. Continue current medications.  3. Complex regional pain syndrome type 1 affecting other site She struggles daily with pain related to CRPS. Follows with pain mgmt.  4. DEPRESSION/ANXIETY Mood has been stable on zoloft.  5. Mild intermittent asthma without complication Has been stable. Continue with singulair.  6. Fatigue, unspecified type We are still working on evaluation for fatigue, cold episodes. She will see endocrinology next week. Consider further eval pending results. - ANA; Future - C-reactive protein; Future - Sedimentation rate; Future - Sedimentation rate - C-reactive protein - ANA  7. Iron deficiency She is unable to tolerate  even minimal oral iron supplementation. Poor appetite at baseline. Would like her to discuss iron infusions.  - Ambulatory referral to Hematology    Return for pending bloodwork.    Micheline Rough, MD

## 2019-11-29 NOTE — Telephone Encounter (Signed)
Rx done. 

## 2019-11-29 NOTE — Telephone Encounter (Signed)
-----   Message from Caren Macadam, MD sent at 11/29/2019 12:56 PM EST ----- Can you call her pharmacy and refill syringes for B12 injection? I'm not sure what we sent in last, but she was good with that one (I don't see in system?). She wanted to pick up today

## 2019-12-01 LAB — ANA: Anti Nuclear Antibody (ANA): POSITIVE — AB

## 2019-12-01 LAB — ANTI-NUCLEAR AB-TITER (ANA TITER): ANA Titer 1: 1:160 {titer} — ABNORMAL HIGH

## 2019-12-05 ENCOUNTER — Telehealth: Payer: Self-pay | Admitting: Family Medicine

## 2019-12-05 DIAGNOSIS — R768 Other specified abnormal immunological findings in serum: Secondary | ICD-10-CM

## 2019-12-05 NOTE — Telephone Encounter (Signed)
Pt is calling in stating that she need to know when will the hematologists and rheumatologist will be calling her for her appointment.  Pt state that she do want to go forward with the rheumatologist.  Pt is aware that Dr. Ethlyn Gallery is not in the office today and they will get back in touch with her tomorrow.

## 2019-12-06 ENCOUNTER — Telehealth: Payer: Self-pay | Admitting: Family Medicine

## 2019-12-06 NOTE — Telephone Encounter (Signed)
I faxed the labs from 3/10 to the number below.

## 2019-12-06 NOTE — Telephone Encounter (Signed)
Per labs, referral placed for the rheumatologist. I called the pt, informed her of this and also the hematology referral was placed last week and someone will call with appt info once approved through the insurance.

## 2019-12-06 NOTE — Telephone Encounter (Signed)
Omega from [facility] is requesting pt latest lab work for her appt tomorrow at 7:30am.   Fax:[phone] Attn:Omega

## 2019-12-07 DIAGNOSIS — R768 Other specified abnormal immunological findings in serum: Secondary | ICD-10-CM | POA: Diagnosis not present

## 2019-12-07 DIAGNOSIS — R55 Syncope and collapse: Secondary | ICD-10-CM | POA: Diagnosis not present

## 2019-12-07 DIAGNOSIS — J45909 Unspecified asthma, uncomplicated: Secondary | ICD-10-CM | POA: Diagnosis not present

## 2019-12-07 DIAGNOSIS — D649 Anemia, unspecified: Secondary | ICD-10-CM | POA: Diagnosis not present

## 2019-12-07 DIAGNOSIS — E039 Hypothyroidism, unspecified: Secondary | ICD-10-CM | POA: Diagnosis not present

## 2019-12-07 DIAGNOSIS — E282 Polycystic ovarian syndrome: Secondary | ICD-10-CM | POA: Diagnosis not present

## 2019-12-11 ENCOUNTER — Telehealth: Payer: Self-pay

## 2019-12-11 ENCOUNTER — Telehealth: Payer: PPO | Admitting: Family Medicine

## 2019-12-11 ENCOUNTER — Encounter (HOSPITAL_COMMUNITY): Payer: Self-pay | Admitting: Emergency Medicine

## 2019-12-11 ENCOUNTER — Emergency Department (HOSPITAL_COMMUNITY)
Admission: EM | Admit: 2019-12-11 | Discharge: 2019-12-11 | Disposition: A | Discharge status: Home / Self Care | Payer: PPO | Attending: Emergency Medicine | Admitting: Emergency Medicine

## 2019-12-11 ENCOUNTER — Emergency Department (HOSPITAL_COMMUNITY): Payer: PPO

## 2019-12-11 ENCOUNTER — Other Ambulatory Visit: Payer: Self-pay

## 2019-12-11 ENCOUNTER — Telehealth: Payer: Self-pay | Admitting: Family Medicine

## 2019-12-11 DIAGNOSIS — M79672 Pain in left foot: Secondary | ICD-10-CM | POA: Diagnosis not present

## 2019-12-11 DIAGNOSIS — D8989 Other specified disorders involving the immune mechanism, not elsewhere classified: Secondary | ICD-10-CM | POA: Diagnosis not present

## 2019-12-11 DIAGNOSIS — E039 Hypothyroidism, unspecified: Secondary | ICD-10-CM | POA: Diagnosis not present

## 2019-12-11 DIAGNOSIS — M2550 Pain in unspecified joint: Secondary | ICD-10-CM | POA: Diagnosis not present

## 2019-12-11 DIAGNOSIS — Z7984 Long term (current) use of oral hypoglycemic drugs: Secondary | ICD-10-CM | POA: Insufficient documentation

## 2019-12-11 DIAGNOSIS — M19071 Primary osteoarthritis, right ankle and foot: Secondary | ICD-10-CM | POA: Diagnosis not present

## 2019-12-11 DIAGNOSIS — M19072 Primary osteoarthritis, left ankle and foot: Secondary | ICD-10-CM | POA: Diagnosis not present

## 2019-12-11 DIAGNOSIS — M545 Low back pain: Secondary | ICD-10-CM | POA: Diagnosis not present

## 2019-12-11 DIAGNOSIS — R531 Weakness: Secondary | ICD-10-CM | POA: Diagnosis not present

## 2019-12-11 DIAGNOSIS — M79641 Pain in right hand: Secondary | ICD-10-CM | POA: Diagnosis not present

## 2019-12-11 DIAGNOSIS — J45909 Unspecified asthma, uncomplicated: Secondary | ICD-10-CM | POA: Insufficient documentation

## 2019-12-11 DIAGNOSIS — N390 Urinary tract infection, site not specified: Secondary | ICD-10-CM | POA: Diagnosis not present

## 2019-12-11 DIAGNOSIS — M79671 Pain in right foot: Secondary | ICD-10-CM | POA: Diagnosis not present

## 2019-12-11 DIAGNOSIS — G9050 Complex regional pain syndrome I, unspecified: Secondary | ICD-10-CM | POA: Diagnosis not present

## 2019-12-11 DIAGNOSIS — Z79899 Other long term (current) drug therapy: Secondary | ICD-10-CM | POA: Diagnosis not present

## 2019-12-11 DIAGNOSIS — Z9049 Acquired absence of other specified parts of digestive tract: Secondary | ICD-10-CM | POA: Diagnosis not present

## 2019-12-11 DIAGNOSIS — R768 Other specified abnormal immunological findings in serum: Secondary | ICD-10-CM | POA: Diagnosis not present

## 2019-12-11 DIAGNOSIS — E282 Polycystic ovarian syndrome: Secondary | ICD-10-CM | POA: Diagnosis not present

## 2019-12-11 DIAGNOSIS — K219 Gastro-esophageal reflux disease without esophagitis: Secondary | ICD-10-CM | POA: Diagnosis not present

## 2019-12-11 DIAGNOSIS — M791 Myalgia, unspecified site: Secondary | ICD-10-CM | POA: Diagnosis not present

## 2019-12-11 DIAGNOSIS — I7300 Raynaud's syndrome without gangrene: Secondary | ICD-10-CM | POA: Diagnosis not present

## 2019-12-11 DIAGNOSIS — M79642 Pain in left hand: Secondary | ICD-10-CM | POA: Diagnosis not present

## 2019-12-11 DIAGNOSIS — R5383 Other fatigue: Secondary | ICD-10-CM | POA: Diagnosis not present

## 2019-12-11 DIAGNOSIS — R109 Unspecified abdominal pain: Secondary | ICD-10-CM | POA: Diagnosis not present

## 2019-12-11 DIAGNOSIS — M533 Sacrococcygeal disorders, not elsewhere classified: Secondary | ICD-10-CM | POA: Diagnosis not present

## 2019-12-11 DIAGNOSIS — J449 Chronic obstructive pulmonary disease, unspecified: Secondary | ICD-10-CM | POA: Diagnosis not present

## 2019-12-11 DIAGNOSIS — M1990 Unspecified osteoarthritis, unspecified site: Secondary | ICD-10-CM | POA: Diagnosis not present

## 2019-12-11 DIAGNOSIS — G8929 Other chronic pain: Secondary | ICD-10-CM | POA: Diagnosis not present

## 2019-12-11 DIAGNOSIS — M359 Systemic involvement of connective tissue, unspecified: Secondary | ICD-10-CM | POA: Diagnosis not present

## 2019-12-11 DIAGNOSIS — R319 Hematuria, unspecified: Secondary | ICD-10-CM | POA: Diagnosis present

## 2019-12-11 LAB — URINALYSIS, ROUTINE W REFLEX MICROSCOPIC
Bilirubin Urine: NEGATIVE
Glucose, UA: NEGATIVE mg/dL
Ketones, ur: 80 mg/dL — AB
Nitrite: NEGATIVE
Protein, ur: 30 mg/dL — AB
Specific Gravity, Urine: 1.027 (ref 1.005–1.030)
pH: 5 (ref 5.0–8.0)

## 2019-12-11 LAB — COMPREHENSIVE METABOLIC PANEL
ALT: 10 U/L (ref 0–44)
AST: 20 U/L (ref 15–41)
Albumin: 4.4 g/dL (ref 3.5–5.0)
Alkaline Phosphatase: 47 U/L (ref 38–126)
Anion gap: 13 (ref 5–15)
BUN: 15 mg/dL (ref 6–20)
CO2: 21 mmol/L — ABNORMAL LOW (ref 22–32)
Calcium: 9 mg/dL (ref 8.9–10.3)
Chloride: 105 mmol/L (ref 98–111)
Creatinine, Ser: 0.82 mg/dL (ref 0.44–1.00)
GFR calc Af Amer: >60 mL/min (ref >60)
GFR calc non Af Amer: >60 mL/min (ref >60)
Glucose, Bld: 104 mg/dL — ABNORMAL HIGH (ref 70–99)
Potassium: 3.9 mmol/L (ref 3.5–5.1)
Sodium: 139 mmol/L (ref 135–145)
Total Bilirubin: 0.5 mg/dL (ref 0.3–1.2)
Total Protein: 7.6 g/dL (ref 6.5–8.1)

## 2019-12-11 LAB — LIPASE, BLOOD: Lipase: 17 U/L (ref 11–51)

## 2019-12-11 LAB — CBC
HCT: 40.4 % (ref 36.0–46.0)
Hemoglobin: 12.9 g/dL (ref 12.0–15.0)
MCH: 29.4 pg (ref 26.0–34.0)
MCHC: 31.9 g/dL (ref 30.0–36.0)
MCV: 92 fL (ref 80.0–100.0)
Platelets: 273 10*3/uL (ref 150–400)
RBC: 4.39 MIL/uL (ref 3.87–5.11)
RDW: 13.2 % (ref 11.5–15.5)
WBC: 6.5 10*3/uL (ref 4.0–10.5)
nRBC: 0 % (ref 0.0–0.2)

## 2019-12-11 LAB — I-STAT BETA HCG BLOOD, ED (MC, WL, AP ONLY): I-stat hCG, quantitative: <5 m[IU]/mL (ref <5)

## 2019-12-11 MED ORDER — IOHEXOL 300 MG/ML  SOLN
100.0000 mL | Freq: Once | INTRAMUSCULAR | Status: AC | PRN
  Administered 2019-12-11: 100 mL via INTRAVENOUS

## 2019-12-11 MED ORDER — SODIUM CHLORIDE (PF) 0.9 % IJ SOLN
INTRAMUSCULAR | Status: AC
  Filled 2019-12-11: qty 50

## 2019-12-11 NOTE — ED Notes (Signed)
Pt verbalizes understanding of DC instructions. Pt belongings returned and is ambulatory out of ED.  

## 2019-12-11 NOTE — Discharge Instructions (Addendum)
You need to follow-up with your primary doctor for recheck.  Return here as needed.  Increase your fluid intake.

## 2019-12-11 NOTE — ED Triage Notes (Signed)
Patient reports seen x1 week ago at PCP after feeling fatigued and rash to face. States lab work showed low hemoglobin at that time. Reports hematuria with clots today while giving sample at PCP. Sent for further evaluation. Reports lower abdominal pain.

## 2019-12-11 NOTE — Telephone Encounter (Signed)
The nurse line called to let us know that the patient has blood in her urine and really strong odor. She has a Reumatory appointment today at 10:30  The nurse suggested that she goes to the ED and the patient refused going to the ED.  The nurse said that the patient doesn't sound real well and wants someone to call the patient and suggest for them to go to the ED.  Please Advise

## 2019-12-11 NOTE — Telephone Encounter (Signed)
Pt was scheduled for a virtual appointment with Dr Volanda Napoleon this morning for Blood in urine, spoke with pt state that she had a f/u appointment with her Rheumatologist today at 10.30 am and she would ask them test her urine. Pt state that she provided a sample at the Rheumatology office but while in the bathroom she passed 2 medium blood clots but did not report to the office, advised pt that they will call her with urine results in a week. Pt state that she was in alot of pain, Spoke with Dr Volanda Napoleon explained of pt report,  advised pt to go to the ED for further evaluation. Pt verbalized understanding state that she will go to the ED.

## 2019-12-11 NOTE — Telephone Encounter (Signed)
Spoke with the Jo [name] and scheduled a phone visit with Dr Volanda Napoleon for today at 1pm.  Patient stated she feels weak and was told she "didn't have any iron in her blood and she does not have COVID".  I advised the Jo [name] to contact the rheumatology office for advice on whether to attend the appt there.

## 2019-12-11 NOTE — ED Notes (Signed)
Labeled urine and culture specimen sent to lab. [employer]

## 2019-12-12 ENCOUNTER — Telehealth: Payer: Self-pay | Admitting: Adult Health

## 2019-12-12 NOTE — Telephone Encounter (Signed)
Received a new hem referral from Dr. Ethlyn Gallery for iron deficiency. Pt cld to schedule a new hem appt w/Lindsey Causey on 3/24 at 10am w/labs at 930am. Pt aware to arrive 15 minutes early.

## 2019-12-12 NOTE — ED Provider Notes (Signed)
[place] DEPT Provider Note   CSN: [id number] Arrival date & time: 12/11/19  1507     History Chief Complaint  Patient presents with  . Hematuria    Jo [name] is a 59 y.o. female.  HPI Patient presents to the emergency department with blood in her urine this been ongoing for several weeks.  The patient states that she had lab work done by her doctor because she is having generalized weakness that is an ongoing for several months.  The patient states that they are concerned that her iron levels are low.  The patient states that they are concerned that she may have some autoimmune disorder that is causing her symptoms as well.  The patient was seen by rheumatologist today.  She states she was sent here by her primary doctor for blood in her urine.  The patient denies chest pain, shortness of breath, headache,blurred vision, neck pain, fever, cough, numbness, dizziness, anorexia, edema, abdominal pain, nausea, vomiting, diarrhea, rash, back pain, dysuria, hematemesis, bloody stool, near syncope, or syncope.    Past Medical History:  Diagnosis Date  . Allergy   . Anemia   . Anxiety   . Anxiety and depression   . Arthritis   . Asthma   . Chronic headaches   . Colon polyp   . COPD (chronic obstructive pulmonary disease) (HCC)    no o2  . Depression   . Fundic gland polyps of stomach, benign   . Gastritis   . GERD (gastroesophageal reflux disease)   . H. pylori infection 2013  . H/O gastric ulcer 1985  . History of anorexia nervosa   . History of bulimia   . History of pneumonia   . Hypothyroidism   . Irritable bowel syndrome (IBS)   . Obesity   . RSD (reflex sympathetic dystrophy)   . RSD (reflex sympathetic dystrophy)   . Ulcer    gastric, duodenal ulcer    Patient Active Problem List   Diagnosis Date Noted  . Reflex sympathetic dystrophy of lower extremity 06/12/2019  . Disorder of thyroid gland 06/12/2019  . Falls 09/25/2015  .  Musculoskeletal neck pain 09/25/2015  . Headache 09/25/2015  . Memory changes 09/25/2015  . Blurry vision 09/25/2015  . Neck pain 09/25/2015  . Polycystic ovaries 07/11/2013  . Hypothyroidism 05/04/2013  . Complex regional pain syndrome I, unspecified 03/06/2013  . Extremity pain 03/06/2013  . Helicobacter pylori infection 12/22/2012  . REFLEX SYMPATHETIC DYSTROPHY 02/26/2009  . History of colonic polyps 02/25/2009  . OBESITY 01/14/2008  . DEPRESSION/ANXIETY 01/14/2008  . Asthma 01/14/2008  . GERD 01/14/2008  . Irritable colon 01/14/2008  . Hx of neurosis 01/14/2008  . Personal history of other mental and behavioral disorders 01/14/2008    Past Surgical History:  Procedure Laterality Date  . CERVICAL SPINE SURGERY  1990  . CERVIX SURGERY    . CHOLECYSTECTOMY    . COLONOSCOPY    . KNEE ARTHROSCOPY  04/21/2012   right  . LAPAROSCOPIC NISSEN FUNDOPLICATION  [id number]   Dr. Johnathan Hausen  . lumbar spondylosis injected  2013   Dr. Mina Marble  . NASAL SINUS SURGERY    . THORACOSCOPY W/ THORACIC SYMPATHECTOMY     for RSD  . TONSILLECTOMY    . UPPER GASTROINTESTINAL ENDOSCOPY       OB History   No obstetric history on file.     Family History  Problem Relation Age of Onset  . Colon polyps Mother  part of intestines removed  . Raynaud syndrome Mother   . Irritable bowel syndrome Mother   . Other Mother        cold agluten  . Stroke Mother   . Colon polyps Brother   . Kidney Stones Brother   . Breast cancer Maternal Aunt   . Lung cancer Maternal Grandfather   . Colon cancer Neg Hx   . Esophageal cancer Neg Hx   . Rectal cancer Neg Hx   . Stomach cancer Neg Hx   . Migraines Neg Hx   . Seizures Neg Hx     Social History   Tobacco Use  . Smoking status: Never Smoker  . Smokeless tobacco: Never Used  Substance Use Topics  . Alcohol use: No  . Drug use: No    Home Medications Prior to Admission medications   Medication Sig Start Date End Date Taking?  Authorizing Provider  azelastine (ASTELIN) 0.1 % nasal spray Place 1 spray into both nostrils 2 (two) times daily. Use in each nostril as directed 07/12/19  Yes Koberlein, Junell C, MD  busPIRone (BUSPAR) 15 MG tablet Take 0.5 tablets (7.5 mg total) by mouth 2 (two) times daily. 07/12/19  Yes Koberlein, Steele Berg, MD  cholecalciferol (VITAMIN D) 1000 UNITS tablet Take 2,000 Units by mouth daily.   Yes [provider]  clindamycin (CLEOCIN T) 1 % lotion Apply 1 application topically 2 (two) times daily.  10/31/19  Yes [provider]  Cranberry 200 MG CAPS Take 200 mg by mouth 2 (two) times daily.   Yes [provider]  cyanocobalamin (,VITAMIN B-12,) 1000 MCG/ML injection INJECT 1 ML INTO THE MUSCLE ONCE FOR 1 DOSE alternating every 2 weeks x 2 and then monthly Patient taking differently: Inject 1,000 mcg into the muscle as directed. Injection every other month 11/29/19  Yes Koberlein, Junell C, MD  erythromycin with ethanol (EMGEL) 2 % gel Apply topically 2 (two) times daily. 05/25/18  Yes Koberlein, Steele Berg, MD  estradiol (ESTRACE) 2 MG tablet Take 1 tablet (2 mg total) by mouth daily. 07/12/19  Yes Koberlein, Junell C, MD  fexofenadine-pseudoephedrine (ALLEGRA-D 24) 180-240 MG 24 hr tablet Take 1 tablet by mouth at bedtime.   Yes [provider]  fluticasone (CUTIVATE) 0.05 % cream Apply 1 application topically 2 (two) times daily as needed (rash).  10/31/19  Yes [provider]  fluticasone (FLONASE) 50 MCG/ACT nasal spray SPRAY 2 SPRAYS INTO EACH NOSTRIL EVERY DAY Patient taking differently: Place 2 sprays into both nostrils daily.  09/28/19  Yes Koberlein, Junell C, MD  HYDROmorphone (DILAUDID) 4 MG tablet Take 4 mg by mouth every 4 (four) hours as needed for moderate pain or severe pain.    Yes [provider]  lamoTRIgine (LAMICTAL) 100 MG tablet Take 1 tablet (100 mg total) by mouth 2 (two) times daily. 07/12/19  Yes Koberlein, Steele Berg, MD    levothyroxine (SYNTHROID) 125 MCG tablet Take 1 tablet (125 mcg total) by mouth daily. 07/12/19  Yes Koberlein, Steele Berg, MD  medroxyPROGESTERone (PROVERA) 2.5 MG tablet 1 tab alternating with 2 tabs daily Patient taking differently: Take 2.5-5 mg by mouth as directed. Take 1 tab alternating with 2 tabs daily 07/12/19  Yes Koberlein, Junell C, MD  metFORMIN (GLUCOPHAGE) 500 MG tablet Take 2 tabs q am, 1 tab qhs. 07/12/19  Yes Koberlein, Junell C, MD  methadone (DOLOPHINE) 10 MG tablet Take 10-20 mg by mouth in the morning, at noon, and at bedtime.  Take 2 tablets BID and Take 1 tablet qhs   Yes [provider]  montelukast (SINGULAIR) 10 MG tablet TAKE 1 TABLET BY MOUTH EVERYDAY AT BEDTIME 07/12/19  Yes Koberlein, Junell C, MD  Omeprazole-Sodium Bicarbonate (ZEGERID) 20-1100 MG CAPS capsule Take 1 capsule by mouth daily. 07/12/19  Yes Koberlein, Junell C, MD  ondansetron (ZOFRAN) 4 MG tablet Take 1 tablet (4 mg total) by mouth every 8 (eight) hours as needed for nausea. 08/02/12  Yes Gatha Mayer, MD  sertraline (ZOLOFT) 100 MG tablet Take 2 tablets (200 mg total) by mouth daily. 07/12/19  Yes Koberlein, Junell C, MD  sucralfate (CARAFATE) 1 g tablet Take 1 tablet (1 g total) by mouth 4 (four) times daily -  with meals and at bedtime. Patient taking differently: Take 1 g by mouth 3 (three) times daily as needed (indigestion).  07/12/19  Yes Koberlein, Junell C, MD  tiZANidine (ZANAFLEX) 4 MG capsule Take 12 mg by mouth at bedtime as needed for muscle spasms.  07/30/15  Yes [provider]  topiramate (TOPAMAX) 50 MG tablet Take 50-150 mg by mouth 2 (two) times daily. 1 tab every morning and 3 tab at bed time 08/26/15  Yes [provider]  ESTRADIOL ACETATE PO Take 2 mg by mouth daily. 2 mg tablet    [provider]  Metoclopramide HCl 10 MG TBDP Take 1 tablet (10 mg total) by mouth 4 (four) times daily. Patient not taking: Reported on 12/11/2019 07/12/19    Caren Macadam, MD  nystatin cream (MYCOSTATIN) Apply 1 application topically 2 (two) times daily. Patient not taking: Reported on 12/11/2019 05/25/18   Caren Macadam, MD  SYRINGE-NEEDLE, DISP, 3 ML (BD ECLIPSE SYRINGE) 25G X 5/8" 3 ML MISC Use as directed 11/29/19   Caren Macadam, MD    Allergies    Bee pollen, Diazepam, Prednisone, Amoxicillin, Doxycycline, Erythromycin, Morphine, Propoxyphene n-acetaminophen, Sulfa antibiotics, Tetracyclines & related, and Propoxyphene  Review of Systems   Review of Systems All other systems negative except as documented in the HPI. All pertinent positives and negatives as reviewed in the HPI. Physical Exam Updated Vital Signs BP 126/78   Pulse 73   Temp 98.8 F (37.1 C)   Resp 18   Ht 5\' 3"  (1.6 m)   Wt 73.9 kg   SpO2 97%   BMI 28.87 kg/m   Physical Exam Vitals and nursing note reviewed.  Constitutional:      General: She is not in acute distress.    Appearance: She is well-developed.  HENT:     Head: Normocephalic and atraumatic.  Eyes:     Pupils: Pupils are equal, round, and reactive to light.  Cardiovascular:     Rate and Rhythm: Normal rate and regular rhythm.     Heart sounds: Normal heart sounds. No murmur. No friction rub. No gallop.   Pulmonary:     Effort: Pulmonary effort is normal. No respiratory distress.     Breath sounds: Normal breath sounds. No wheezing.  Abdominal:     General: Bowel sounds are normal. There is no distension.     Palpations: Abdomen is soft.     Tenderness: There is no abdominal tenderness.  Musculoskeletal:     Cervical back: Normal range of motion and neck supple.  Skin:    General: Skin is warm and dry.     Capillary Refill: Capillary refill takes less than 2 seconds.     Findings: No erythema or rash.  Neurological:     Mental Status: She is alert and oriented to person, place, and time.     Sensory: No sensory deficit.     Motor: No weakness or abnormal muscle tone.      Coordination: Coordination normal.     Gait: Gait normal.  Psychiatric:        Behavior: Behavior normal.     ED Results / Procedures / Treatments   Labs (all labs ordered are listed, but only abnormal results are displayed) Labs Reviewed  COMPREHENSIVE METABOLIC PANEL - Abnormal; Notable for the following components:      Result Value   CO2 21 (*)    Glucose, Bld 104 (*)    All other components within normal limits  URINALYSIS, ROUTINE W REFLEX MICROSCOPIC - Abnormal; Notable for the following components:   APPearance HAZY (*)    Hgb urine dipstick MODERATE (*)    Ketones, ur 80 (*)    Protein, ur 30 (*)    Leukocytes,Ua SMALL (*)    Bacteria, UA RARE (*)    All other components within normal limits  LIPASE, BLOOD  CBC  I-STAT BETA HCG BLOOD, ED ([facility], [facility], [facility] ONLY)    EKG None  Radiology CT Abdomen Pelvis W Contrast  Result Date: 12/11/2019 CLINICAL DATA:  Abdominal pain EXAM: CT ABDOMEN AND PELVIS WITH CONTRAST TECHNIQUE: Multidetector CT imaging of the abdomen and pelvis was performed using the standard protocol following bolus administration of intravenous contrast. CONTRAST:  192mL OMNIPAQUE IOHEXOL 300 MG/ML  SOLN COMPARISON:  None. FINDINGS: Lower chest: Lung bases are clear. No effusions. Heart is normal size. Hepatobiliary: Prior cholecystectomy. Intrahepatic and extrahepatic biliary ductal dilatation, likely related to post cholecystectomy state. No focal hepatic abnormality. Pancreas: Mild atrophy/fatty replacement. No focal abnormality or ductal dilatation. Spleen: No focal abnormality.  Normal size. Adrenals/Urinary Tract: No adrenal abnormality. No focal renal abnormality. No stones or hydronephrosis. Urinary bladder is unremarkable. Stomach/Bowel: Large stool burden throughout the colon. Stomach, large and small bowel grossly unremarkable. Vascular/Lymphatic: No evidence of aneurysm or adenopathy. Reproductive: Uterus and adnexa unremarkable.  No mass. Other: No free  fluid or free air. Musculoskeletal: No acute bony abnormality. IMPRESSION: Large stool burden throughout the colon. Intrahepatic and extrahepatic biliary ductal dilatation, likely related to post cholecystectomy state. Recommend correlation with LFTs. No acute findings in the abdomen or pelvis. Electronically Signed   By: Rolm Baptise M.D.   On: 12/11/2019 21:45    Procedures Procedures (including critical care time)  Medications Ordered in ED Medications  iohexol (OMNIPAQUE) 300 MG/ML solution 100 mL (100 mLs Intravenous Contrast Given 12/11/19 2059)    ED Course  I have reviewed the triage vital signs and the nursing notes.  Pertinent labs & imaging results that were available during my care of the patient were reviewed by me and considered in my medical decision making (see chart for details).    MDM Rules/Calculators/A&P                      I spoke with the patient about her laboratory testing and findings here today.  I did advise her that tonight we do not see any significant abnormality that would lead [place] to believe that she is having a significant issue that requires admission to the hospital.  I did advise her that at this point we do not see any significant urine issues with blood on the microscopic exam.  Patient states that she will follow back up  with her doctor.  I did advise her to return here for any worsening in her condition.  The patient agrees to this plan and all questions were answered.  Her vital signs remained stable as well here in the emergency department.  Patient was given IV fluids to ensure she has proper hydration.  CT scan and laboratory testing was reviewed.  The CT scan shows large stool burden but no other significant findings. Final Clinical Impression(s) / ED Diagnoses Final diagnoses:  Weakness    Rx / DC Orders ED Discharge Orders    None       Rebeca Allegra 12/12/19 2337    Carmin Muskrat, MD 12/13/19 [phone]

## 2019-12-13 ENCOUNTER — Inpatient Hospital Stay: Payer: PPO

## 2019-12-13 ENCOUNTER — Encounter: Payer: Self-pay | Admitting: Adult Health

## 2019-12-13 ENCOUNTER — Other Ambulatory Visit: Payer: Self-pay

## 2019-12-13 ENCOUNTER — Inpatient Hospital Stay: Payer: PPO | Attending: Adult Health | Admitting: Adult Health

## 2019-12-13 VITALS — BP 129/78 | HR 84 | Temp 98.2°F | Resp 18 | Ht 63.0 in | Wt 159.7 lb

## 2019-12-13 DIAGNOSIS — D508 Other iron deficiency anemias: Secondary | ICD-10-CM

## 2019-12-13 DIAGNOSIS — E538 Deficiency of other specified B group vitamins: Secondary | ICD-10-CM | POA: Insufficient documentation

## 2019-12-13 DIAGNOSIS — E039 Hypothyroidism, unspecified: Secondary | ICD-10-CM | POA: Diagnosis not present

## 2019-12-13 DIAGNOSIS — R5382 Chronic fatigue, unspecified: Secondary | ICD-10-CM | POA: Diagnosis not present

## 2019-12-13 DIAGNOSIS — E611 Iron deficiency: Secondary | ICD-10-CM | POA: Diagnosis not present

## 2019-12-13 LAB — SAVE SMEAR(SSMR), FOR PROVIDER SLIDE REVIEW

## 2019-12-13 LAB — CMP (CANCER CENTER ONLY)
ALT: 7 U/L (ref 0–44)
AST: 17 U/L (ref 15–41)
Albumin: 4.3 g/dL (ref 3.5–5.0)
Alkaline Phosphatase: 59 U/L (ref 38–126)
Anion gap: 10 (ref 5–15)
BUN: 12 mg/dL (ref 6–20)
CO2: 21 mmol/L — ABNORMAL LOW (ref 22–32)
Calcium: 9.1 mg/dL (ref 8.9–10.3)
Chloride: 108 mmol/L (ref 98–111)
Creatinine: 0.85 mg/dL (ref 0.44–1.00)
GFR, Est AFR Am: >60 mL/min (ref >60)
GFR, Estimated: >60 mL/min (ref >60)
Glucose, Bld: 89 mg/dL (ref 70–99)
Potassium: 3.8 mmol/L (ref 3.5–5.1)
Sodium: 139 mmol/L (ref 135–145)
Total Bilirubin: 0.3 mg/dL (ref 0.3–1.2)
Total Protein: 7.5 g/dL (ref 6.5–8.1)

## 2019-12-13 LAB — CBC WITH DIFFERENTIAL (CANCER CENTER ONLY)
Abs Immature Granulocytes: 0.02 10*3/uL (ref 0.00–0.07)
Basophils Absolute: 0 10*3/uL (ref 0.0–0.1)
Basophils Relative: 1 %
Eosinophils Absolute: 0 10*3/uL (ref 0.0–0.5)
Eosinophils Relative: 0 %
HCT: 41.8 % (ref 36.0–46.0)
Hemoglobin: 13.6 g/dL (ref 12.0–15.0)
Immature Granulocytes: 0 %
Lymphocytes Relative: 16 %
Lymphs Abs: 1.1 10*3/uL (ref 0.7–4.0)
MCH: 29.2 pg (ref 26.0–34.0)
MCHC: 32.5 g/dL (ref 30.0–36.0)
MCV: 89.7 fL (ref 80.0–100.0)
Monocytes Absolute: 0.4 10*3/uL (ref 0.1–1.0)
Monocytes Relative: 5 %
Neutro Abs: 5.5 10*3/uL (ref 1.7–7.7)
Neutrophils Relative %: 78 %
Platelet Count: 269 10*3/uL (ref 150–400)
RBC: 4.66 MIL/uL (ref 3.87–5.11)
RDW: 13.4 % (ref 11.5–15.5)
WBC Count: 7 10*3/uL (ref 4.0–10.5)
nRBC: 0 % (ref 0.0–0.2)

## 2019-12-13 LAB — VITAMIN B12: Vitamin B-12: 629 pg/mL (ref 180–914)

## 2019-12-13 LAB — RETICULOCYTES
Immature Retic Fract: 14.2 % (ref 2.3–15.9)
RBC.: 4.59 MIL/uL (ref 3.87–5.11)
Retic Count, Absolute: 125.3 10*3/uL (ref 19.0–186.0)
Retic Ct Pct: 2.7 % (ref 0.4–3.1)

## 2019-12-13 LAB — FERRITIN: Ferritin: 19 ng/mL (ref 11–307)

## 2019-12-13 LAB — FOLATE: Folate: 26.3 ng/mL (ref >5.9)

## 2019-12-13 LAB — IRON AND TIBC
Iron: 82 ug/dL (ref 41–142)
Saturation Ratios: 16 % — ABNORMAL LOW (ref 21–57)
TIBC: 495 ug/dL — ABNORMAL HIGH (ref 236–444)
UIBC: 413 ug/dL — ABNORMAL HIGH (ref 120–384)

## 2019-12-13 MED ORDER — FOLIC ACID 1 MG PO TABS: 1.0000 mg | ORAL_TABLET | Freq: Every day | ORAL | 6 refills | Status: DC

## 2019-12-13 NOTE — Progress Notes (Addendum)
[place]  Telephone:([phone] [phone]) [phone]     ID: Jo [name] DOB: 1961/03/22  MR#: [id number]  QIH#:[id number]  Patient Care Team: Caren Macadam, MD as PCP - General (Family Medicine) Leone Payor, MD as Referring Physician (Anesthesiology) Gatha Mayer, MD as Consulting Physician (Gastroenterology) Magrinat, Virgie Dad, MD as Consulting Physician (Oncology) [name], MD as Consulting Physician (Rheumatology) Jacelyn Pi, MD as Referring Physician (Endocrinology) Scot Dock, NP OTHER MD:  CHIEF COMPLAINT: Iron deficiency  CURRENT TREATMENT: to receive IV iron   HISTORY OF CURRENT ILLNESS:  Jo [name] is here today for evaluation and management for iron deficiency at the request of Dr. Ethlyn Gallery.  Jo [name] has had chronic fatigue, however notes that her fatigue and weakness have worsened over the past year.  Her iron studies have been as follows:  Results for HAVANA, BALDWIN (MRN [id number]) as of 12/13/2019 12:01  Ref. Range 02/21/2007 15:32 08/01/2012 16:05 09/27/2018 13:03 11/15/2019 09:49  Iron Latest Ref Range: 42 - 145 ug/dL 80   61  Saturation Ratios Latest Ref Range: 20.0 - 50.0 % 19.0 (L)   13.2 (L)  Ferritin Latest Ref Range: 10.0 - 291.0 ng/mL 15.0 5.6 (L)  8.1 (L)  Transferrin Latest Ref Range: 212.0 - 360.0 mg/dL 301.1   329.0  Folate Latest Ref Range: >5.9 ng/mL 19.0  17.6 6.0   Dicie cannot tolerate oral iron, and she was referred here for evaluation of iron deficiency and management with IV iron.  Shir's CBC has maintained a stable and normal hemoglobin without evidence of microcytosis:  Results for [name], [name] (MRN [id number]) as of 12/13/2019 12:01  Ref. Range 11/15/2019 09:49 12/11/2019 15:59  WBC Latest Ref Range: 4.0 - 10.5 K/uL 4.9 6.5  RBC Latest Ref Range: 3.87 - 5.11 MIL/uL 4.16 4.39  Hemoglobin Latest Ref Range: 12.0 - 15.0 g/dL 12.2 12.9  HCT Latest Ref Range: 36.0 - 46.0 % 37.4 40.4  MCV Latest Ref Range: 80.0 - 100.0  fL 89.8 92.0  MCH Latest Ref Range: 26.0 - 34.0 pg  29.4  MCHC Latest Ref Range: 30.0 - 36.0 g/dL 32.6 31.9  RDW Latest Ref Range: 11.5 - 15.5 % 13.4 13.2  Platelets Latest Ref Range: 150 - 400 K/uL 248.0 273   Kris is also in the middle of undergoing evaluation by rheumatology for lupus.  She has a worsening facial rash and also positive ANA.    The patient's subsequent history is as detailed below.  INTERVAL HISTORY: Jo [name] is here today for evaluation and management of her iron deficiency and inability to tolerate oral iron.  She was also found to be folate deficient and has not started any folic acid due to not going by the store and picking it up as recommended by her PCP.    She is feeling very weak and tired.  She notes that she is miserable.  She says that she has h/o complex regional pain syndrome that is managed in [place] with methadone and Dilaudid.  She notes that she was told this syndrome is attacking her organs.   She met with Dr. Isaias Sakai in rheumatology and is following with him regarding her lupus work up.   REVIEW OF SYSTEMS:  Jo [name] denies any heavy menses.  She notes that her cycles are decreased due to her PCOS.  She denies any fever, chills, chest pain, cough, shortness of breath.  She is achy all over and has chronic pain as managed per the above.  She isn't able to exercise.  She denies any bowel/bladder issues, or any nausea/vomiting.  She sees Dr. Carlean Purl in GI and has h/o GERD and has undergone colonoscopy in 2013 and upper endoscopy in 2018.  A detailed ROS was otherwise non contributory.    PAST MEDICAL HISTORY: Past Medical History:  Diagnosis Date  . Allergy   . Anemia   . Anxiety   . Anxiety and depression   . Arthritis   . Asthma   . Chronic headaches   . Colon polyp   . COPD (chronic obstructive pulmonary disease) (HCC)    no o2  . Depression   . Fundic gland polyps of stomach, benign   . Gastritis   . GERD (gastroesophageal reflux disease)   . H.  pylori infection 2013  . H/O gastric ulcer 1985  . History of anorexia nervosa   . History of bulimia   . History of pneumonia   . Hypothyroidism   . Irritable bowel syndrome (IBS)   . Obesity   . RSD (reflex sympathetic dystrophy)   . RSD (reflex sympathetic dystrophy)   . Ulcer    gastric, duodenal ulcer    PAST SURGICAL HISTORY: Past Surgical History:  Procedure Laterality Date  . CERVICAL SPINE SURGERY  1990  . CERVIX SURGERY    . CHOLECYSTECTOMY    . COLONOSCOPY    . KNEE ARTHROSCOPY  04/21/2012   right  . LAPAROSCOPIC NISSEN FUNDOPLICATION  85/10/7739   Dr. Johnathan Hausen  . lumbar spondylosis injected  2013   Dr. Mina Marble  . NASAL SINUS SURGERY    . THORACOSCOPY W/ THORACIC SYMPATHECTOMY     for RSD  . TONSILLECTOMY    . UPPER GASTROINTESTINAL ENDOSCOPY      FAMILY HISTORY Family History  Problem Relation Age of Onset  . Colon polyps Mother        part of intestines removed  . Raynaud syndrome Mother   . Irritable bowel syndrome Mother   . Other Mother        cold agluten  . Stroke Mother   . Colon polyps Brother   . Kidney Stones Brother   . Breast cancer Maternal Aunt   . Lung cancer Maternal Grandfather   . Colon cancer Neg Hx   . Esophageal cancer Neg Hx   . Rectal cancer Neg Hx   . Stomach cancer Neg Hx   . Migraines Neg Hx   . Seizures Neg Hx     GYNECOLOGIC HISTORY:  No LMP recorded. Patient is postmenopausal. Menarche: 59 years old [place] P 0 LMP 3/23 Contraceptivenone HRT no  Hysterectomy? no Salpingo-oophorectomy?no    SOCIAL HISTORY:  [name] is divorced and lives alone in [place], [place].  She was previously a flight attendant, but is unable to work with her current physical issues. She does not have any children and her sister and brother in law are her emergency contact, and live nearby.       ADVANCED DIRECTIVES:    HEALTH MAINTENANCE: Social History   Tobacco Use  . Smoking status: Never Smoker  . Smokeless tobacco: Never Used    Substance Use Topics  . Alcohol use: No  . Drug use: No     Colonoscopy:2013  PAP:   Bone density:   Allergies  Allergen Reactions  . Bee Pollen Anaphylaxis  . Diazepam     sucidal depression  . Prednisone Nausea And Vomiting    Per pt can not take oral steroids   .  Amoxicillin     Severe diarrhea At high dose   . Doxycycline     Pt does not remember reaction  . Erythromycin     n & v  . Morphine     n & v  . Propoxyphene N-Acetaminophen     n & V  . Sulfa Antibiotics     MIGRAINES  . Tetracyclines & Related     N & v  . Propoxyphene Other (See Comments)    Current Outpatient Medications  Medication Sig Dispense Refill  . azelastine (ASTELIN) 0.1 % nasal spray Place 1 spray into both nostrils 2 (two) times daily. Use in each nostril as directed 30 mL 2  . busPIRone (BUSPAR) 15 MG tablet Take 0.5 tablets (7.5 mg total) by mouth 2 (two) times daily. 90 tablet 1  . cholecalciferol (VITAMIN D) 1000 UNITS tablet Take 2,000 Units by mouth daily.    . clindamycin (CLEOCIN T) 1 % lotion Apply 1 application topically 2 (two) times daily.     . Cranberry 200 MG CAPS Take 200 mg by mouth 2 (two) times daily.    . cyanocobalamin (,VITAMIN B-12,) 1000 MCG/ML injection INJECT 1 ML INTO THE MUSCLE ONCE FOR 1 DOSE alternating every 2 weeks x 2 and then monthly (Patient taking differently: Inject 1,000 mcg into the muscle as directed. Injection every other month) 10 mL 1  . erythromycin with ethanol (EMGEL) 2 % gel Apply topically 2 (two) times daily. 30 g 1  . estradiol (ESTRACE) 2 MG tablet Take 1 tablet (2 mg total) by mouth daily. 90 tablet 1  . ESTRADIOL ACETATE PO Take 2 mg by mouth daily. 2 mg tablet    . fexofenadine-pseudoephedrine (ALLEGRA-D 24) 180-240 MG 24 hr tablet Take 1 tablet by mouth at bedtime.    . fluticasone (CUTIVATE) 0.05 % cream Apply 1 application topically 2 (two) times daily as needed (rash).     . fluticasone (FLONASE) 50 MCG/ACT nasal spray SPRAY 2 SPRAYS  INTO EACH NOSTRIL EVERY DAY (Patient taking differently: Place 2 sprays into both nostrils daily. ) 48 mL 1  . HYDROmorphone (DILAUDID) 4 MG tablet Take 4 mg by mouth every 4 (four) hours as needed for moderate pain or severe pain.     Marland Kitchen lamoTRIgine (LAMICTAL) 100 MG tablet Take 1 tablet (100 mg total) by mouth 2 (two) times daily. 180 tablet 1  . levothyroxine (SYNTHROID) 125 MCG tablet Take 1 tablet (125 mcg total) by mouth daily. 90 tablet 1  . medroxyPROGESTERone (PROVERA) 2.5 MG tablet 1 tab alternating with 2 tabs daily (Patient taking differently: Take 2.5-5 mg by mouth as directed. Take 1 tab alternating with 2 tabs daily) 135 tablet 1  . metFORMIN (GLUCOPHAGE) 500 MG tablet Take 2 tabs q am, 1 tab qhs. 270 tablet 1  . methadone (DOLOPHINE) 10 MG tablet Take 10-20 mg by mouth in the morning, at noon, and at bedtime. Take 2 tablets BID and Take 1 tablet qhs    . Metoclopramide HCl 10 MG TBDP Take 1 tablet (10 mg total) by mouth 4 (four) times daily. 360 tablet 1  . montelukast (SINGULAIR) 10 MG tablet TAKE 1 TABLET BY MOUTH EVERYDAY AT BEDTIME 90 tablet 1  . nystatin cream (MYCOSTATIN) Apply 1 application topically 2 (two) times daily. 30 g 1  . Omeprazole-Sodium Bicarbonate (ZEGERID) 20-1100 MG CAPS capsule Take 1 capsule by mouth daily. 90 capsule 1  . ondansetron (ZOFRAN) 4 MG tablet Take 1 tablet (4 mg  total) by mouth every 8 (eight) hours as needed for nausea. 20 tablet 2  . sertraline (ZOLOFT) 100 MG tablet Take 2 tablets (200 mg total) by mouth daily. 180 tablet 1  . sucralfate (CARAFATE) 1 g tablet Take 1 tablet (1 g total) by mouth 4 (four) times daily -  with meals and at bedtime. (Patient taking differently: Take 1 g by mouth 3 (three) times daily as needed (indigestion). )    . SYRINGE-NEEDLE, DISP, 3 ML (BD ECLIPSE SYRINGE) 25G X 5/8" 3 ML MISC Use as directed 100 each 0  . tiZANidine (ZANAFLEX) 4 MG capsule Take 12 mg by mouth at bedtime as needed for muscle spasms.     Marland Kitchen  topiramate (TOPAMAX) 50 MG tablet Take 50-150 mg by mouth 2 (two) times daily. 1 tab every morning and 3 tab at bed time     Current Facility-Administered Medications  Medication Dose Route Frequency Provider Last Rate Last Admin  . cyanocobalamin ((VITAMIN B-12)) injection 1,000 mcg  1,000 mcg Intramuscular Q14 Days Koberlein, Junell C, MD   1,000 mcg at 05/25/18 1244    OBJECTIVE:  Vitals:   12/13/19 1000  BP: 129/78  Pulse: 84  Resp: 18  Temp: 98.2 F (36.8 C)  SpO2: 100%     Body mass index is 28.29 kg/m.   Wt Readings from Last 3 Encounters:  12/13/19 159 lb 11.2 oz (72.4 kg)  12/11/19 163 lb (73.9 kg)  11/29/19 170 lb 3.2 oz (77.2 kg)      ECOG FS:1 - Symptomatic but completely ambulatory GENERAL: Patient is a tired appearing female in no acute distress HEENT:  Sclerae anicteric.  Mask in place. Neck is supple.  NODES:  No cervical, supraclavicular, or axillary lymphadenopathy palpated.  BREAST EXAM:  Deferred. LUNGS:  Clear to auscultation bilaterally.  No wheezes or rhonchi. HEART:  Regular rate and rhythm. No murmur appreciated. ABDOMEN:  Soft, nontender.  Positive, normoactive bowel sounds. No organomegaly palpated. MSK:  No focal spinal tenderness to palpation.  EXTREMITIES:  No peripheral edema.   SKIN:  + facial erythema NEURO:  Nonfocal. Well oriented.  Appropriate affect.     LAB RESULTS:  CMP     Component Value Date/Time   NA 139 12/11/2019 1559   K 3.9 12/11/2019 1559   CL 105 12/11/2019 1559   CO2 21 (L) 12/11/2019 1559   GLUCOSE 104 (H) 12/11/2019 1559   BUN 15 12/11/2019 1559   CREATININE 0.82 12/11/2019 1559   CALCIUM 9.0 12/11/2019 1559   PROT 7.6 12/11/2019 1559   ALBUMIN 4.4 12/11/2019 1559   AST 20 12/11/2019 1559   ALT 10 12/11/2019 1559   ALKPHOS 47 12/11/2019 1559   BILITOT 0.5 12/11/2019 1559   GFRNONAA >60 12/11/2019 1559   GFRAA >60 12/11/2019 1559    No results found for: TOTALPROTELP, ALBUMINELP, A1GS, A2GS, BETS,  BETA2SER, GAMS, MSPIKE, SPEI  No results found for: Nils Pyle, [facility]  Lab Results  Component Value Date   WBC 7.0 12/13/2019   NEUTROABS 5.5 12/13/2019   HGB 13.6 12/13/2019   HCT 41.8 12/13/2019   MCV 89.7 12/13/2019   PLT 269 12/13/2019      Chemistry      Component Value Date/Time   NA 139 12/11/2019 1559   K 3.9 12/11/2019 1559   CL 105 12/11/2019 1559   CO2 21 (L) 12/11/2019 1559   BUN 15 12/11/2019 1559   CREATININE 0.82 12/11/2019 1559      Component Value  Date/Time   CALCIUM 9.0 12/11/2019 1559   ALKPHOS 47 12/11/2019 1559   AST 20 12/11/2019 1559   ALT 10 12/11/2019 1559   BILITOT 0.5 12/11/2019 1559       No results found for: LABCA2  No components found for: RKYHCW237  No results for input(s): INR in the last 168 hours.  No results found for: LABCA2  No results found for: SEG315  No results found for: VVO160  No results found for: VPX106  No results found for: CA2729  No components found for: HGQUANT  No results found for: CEA1 / No results found for: CEA1   No results found for: AFPTUMOR  No results found for: CHROMOGRNA  No results found for: PSA1  Appointment on 12/13/2019  Component Date Value Ref Range Status  . Retic Ct Pct 12/13/2019 2.7  0.4 - 3.1 % Final  . RBC. 12/13/2019 4.59  3.87 - 5.11 MIL/uL Final  . Retic Count, Absolute 12/13/2019 125.3  19.0 - 186.0 K/uL Final  . Immature Retic Fract 12/13/2019 14.2  2.3 - 15.9 % Final   Performed at [facility], [place] [address] [place], [place] [address]  . WBC Count 12/13/2019 7.0  4.0 - 10.5 K/uL Final  . RBC 12/13/2019 4.66  3.87 - 5.11 MIL/uL Final  . Hemoglobin 12/13/2019 13.6  12.0 - 15.0 g/dL Final  . HCT 12/13/2019 41.8  36.0 - 46.0 % Final  . MCV 12/13/2019 89.7  80.0 - 100.0 fL Final  . MCH 12/13/2019 29.2  26.0 - 34.0 pg Final  . MCHC 12/13/2019 32.5  30.0 - 36.0 g/dL Final  . RDW 12/13/2019 13.4  11.5 - 15.5 % Final  .  Platelet Count 12/13/2019 269  150 - 400 K/uL Final  . nRBC 12/13/2019 0.0  0.0 - 0.2 % Final  . Neutrophils Relative % 12/13/2019 78  % Final  . Neutro Abs 12/13/2019 5.5  1.7 - 7.7 K/uL Final  . Lymphocytes Relative 12/13/2019 16  % Final  . Lymphs Abs 12/13/2019 1.1  0.7 - 4.0 K/uL Final  . Monocytes Relative 12/13/2019 5  % Final  . Monocytes Absolute 12/13/2019 0.4  0.1 - 1.0 K/uL Final  . Eosinophils Relative 12/13/2019 0  % Final  . Eosinophils Absolute 12/13/2019 0.0  0.0 - 0.5 K/uL Final  . Basophils Relative 12/13/2019 1  % Final  . Basophils Absolute 12/13/2019 0.0  0.0 - 0.1 K/uL Final  . Immature Granulocytes 12/13/2019 0  % Final  . Abs Immature Granulocytes 12/13/2019 0.02  0.00 - 0.07 K/uL Final   Performed at [facility], [place] [address], [place], [place] [address]  . Smear Review 12/13/2019 SMEAR STAINED AND AVAILABLE FOR REVIEW   Final   Performed at [facility], [address], [place], [place] [address]  Admission on 12/11/2019, Discharged on 12/11/2019  Component Date Value Ref Range Status  . Lipase 12/11/2019 17  11 - 51 U/L Final   Performed at [facility], [place] [address], [place],  [address]  . Sodium 12/11/2019 139  135 - 145 mmol/L Final  . Potassium 12/11/2019 3.9  3.5 - 5.1 mmol/L Final  . Chloride 12/11/2019 105  98 - 111 mmol/L Final  . CO2 12/11/2019 21* 22 - 32 mmol/L Final  . Glucose, Bld 12/11/2019 104* 70 - 99 mg/dL Final   Glucose reference range applies only to samples taken after fasting for at least 8  hours.  . BUN 12/11/2019 15  6 - 20 mg/dL Final  . Creatinine, Ser 12/11/2019 0.82  0.44 - 1.00 mg/dL Final  . Calcium 12/11/2019 9.0  8.9 - 10.3 mg/dL Final  . Total Protein 12/11/2019 7.6  6.5 - 8.1 g/dL Final  . Albumin 12/11/2019 4.4  3.5 - 5.0 g/dL Final  . AST 12/11/2019 20  15 - 41 U/L Final  . ALT 12/11/2019 10  0 - 44 U/L Final  . Alkaline Phosphatase  12/11/2019 47  38 - 126 U/L Final  . Total Bilirubin 12/11/2019 0.5  0.3 - 1.2 mg/dL Final  . GFR calc non Af Amer 12/11/2019 >60  >60 mL/min Final  . GFR calc Af Amer 12/11/2019 >60  >60 mL/min Final  . Anion gap 12/11/2019 13  5 - 15 Final   Performed at [facility], [place] [address] [place], [place] [address]  . WBC 12/11/2019 6.5  4.0 - 10.5 K/uL Final  . RBC 12/11/2019 4.39  3.87 - 5.11 MIL/uL Final  . Hemoglobin 12/11/2019 12.9  12.0 - 15.0 g/dL Final  . HCT 12/11/2019 40.4  36.0 - 46.0 % Final  . MCV 12/11/2019 92.0  80.0 - 100.0 fL Final  . MCH 12/11/2019 29.4  26.0 - 34.0 pg Final  . MCHC 12/11/2019 31.9  30.0 - 36.0 g/dL Final  . RDW 12/11/2019 13.2  11.5 - 15.5 % Final  . Platelets 12/11/2019 273  150 - 400 K/uL Final  . nRBC 12/11/2019 0.0  0.0 - 0.2 % Final   Performed at [facility], [place] [address] [place], [place] [address]  . Color, Urine 12/11/2019 YELLOW  YELLOW Final  . APPearance 12/11/2019 HAZY* CLEAR Final  . Specific Gravity, Urine 12/11/2019 1.027  1.005 - 1.030 Final  . pH 12/11/2019 5.0  5.0 - 8.0 Final  . Glucose, UA 12/11/2019 NEGATIVE  NEGATIVE mg/dL Final  . Hgb urine dipstick 12/11/2019 MODERATE* NEGATIVE Final  . Bilirubin Urine 12/11/2019 NEGATIVE  NEGATIVE Final  . Ketones, ur 12/11/2019 80* NEGATIVE mg/dL Final  . Protein, ur 12/11/2019 30* NEGATIVE mg/dL Final  . Nitrite 12/11/2019 NEGATIVE  NEGATIVE Final  . Chalmers Guest 12/11/2019 SMALL* NEGATIVE Final  . RBC / HPF 12/11/2019 0-5  0 - 5 RBC/hpf Final  . WBC, UA 12/11/2019 11-20  0 - 5 WBC/hpf Final  . Bacteria, UA 12/11/2019 RARE* NONE SEEN Final  . Squamous Epithelial / LPF 12/11/2019 6-10  0 - 5 Final  . Mucus 12/11/2019 PRESENT   Final  . Hyaline Casts, UA 12/11/2019 PRESENT   Final   Performed at [facility], [place] [address], [place],  [address]  . I-stat hCG, quantitative 12/11/2019 <5.0  <5 mIU/mL Final  . Comment 3  12/11/2019          Final   Comment:   GEST. AGE      CONC.  (mIU/mL)   <=1 WEEK        5 - 50     2 WEEKS       50 - 500     3 WEEKS       100 - 10,000     4 WEEKS     1,000 - 30,000        FEMALE AND NON-PREGNANT FEMALE:     LESS THAN 5 mIU/mL     (this displays the last labs from the last 3 days)  No results found for: TOTALPROTELP, ALBUMINELP, A1GS,  A2GS, BETS, BETA2SER, GAMS, MSPIKE, SPEI (this displays SPEP labs)  No results found for: KPAFRELGTCHN, LAMBDASER, KAPLAMBRATIO (kappa/lambda light chains)  No results found for: HGBA, HGBA2QUANT, HGBFQUANT, HGBSQUAN (Hemoglobinopathy evaluation)   No results found for: LDH  Lab Results  Component Value Date   IRON 61 11/15/2019   IRONPCTSAT 13.2 (L) 11/15/2019   (Iron and TIBC)  Lab Results  Component Value Date   FERRITIN 8.1 (L) 11/15/2019    Urinalysis    Component Value Date/Time   COLORURINE YELLOW 12/11/2019 1559   APPEARANCEUR HAZY (A) 12/11/2019 1559   LABSPEC 1.027 12/11/2019 1559   PHURINE 5.0 12/11/2019 1559   GLUCOSEU NEGATIVE 12/11/2019 1559   HGBUR MODERATE (A) 12/11/2019 1559   BILIRUBINUR NEGATIVE 12/11/2019 1559   KETONESUR 80 (A) 12/11/2019 1559   PROTEINUR 30 (A) 12/11/2019 1559   UROBILINOGEN 0.2 12/16/2010 1802   NITRITE NEGATIVE 12/11/2019 1559   LEUKOCYTESUR SMALL (A) 12/11/2019 1559     STUDIES: CT Abdomen Pelvis W Contrast  Result Date: 12/11/2019 CLINICAL DATA:  Abdominal pain EXAM: CT ABDOMEN AND PELVIS WITH CONTRAST TECHNIQUE: Multidetector CT imaging of the abdomen and pelvis was performed using the standard protocol following bolus administration of intravenous contrast. CONTRAST:  178m OMNIPAQUE IOHEXOL 300 MG/ML  SOLN COMPARISON:  None. FINDINGS: Lower chest: Lung bases are clear. No effusions. Heart is normal size. Hepatobiliary: Prior cholecystectomy. Intrahepatic and extrahepatic biliary ductal dilatation, likely related to post cholecystectomy state. No focal hepatic  abnormality. Pancreas: Mild atrophy/fatty replacement. No focal abnormality or ductal dilatation. Spleen: No focal abnormality.  Normal size. Adrenals/Urinary Tract: No adrenal abnormality. No focal renal abnormality. No stones or hydronephrosis. Urinary bladder is unremarkable. Stomach/Bowel: Large stool burden throughout the colon. Stomach, large and small bowel grossly unremarkable. Vascular/Lymphatic: No evidence of aneurysm or adenopathy. Reproductive: Uterus and adnexa unremarkable.  No mass. Other: No free fluid or free air. Musculoskeletal: No acute bony abnormality. IMPRESSION: Large stool burden throughout the colon. Intrahepatic and extrahepatic biliary ductal dilatation, likely related to post cholecystectomy state. Recommend correlation with LFTs. No acute findings in the abdomen or pelvis. Electronically Signed   By: KRolm BaptiseM.D.   On: 12/11/2019 21:45    ASSESSMENT: 59y.o. [place] woman who is here today for management of her iron deficiency.   1. Longstanding chronic fatigue, worsened since 2020  (a) positive ANA, CRP, ESR noted on 11/29/2019  (b) under care of Dr. AIsaias Sakai 2.  Iron deficiency dating back to 2013  (a) unable to tolerate oral iron  (b) [name] recent ferritin on 2/24 was 8  (c) to receive IV iron on 3/31  3. Folate deficiency  (a) 11/15/2019 was noted to be 6  (b) has been recommended oral supplementation, she has not started this  PLAN:  LRutheis here today for evaluation and management of her iron deficiency.  She cannot tolerate oral iron.  She met with myself and Dr. MJana Hakim Dr. MJana Hakimreviewed with her that she has iron stores and that the only way for these to decrease is to bleed.  She has infrequent menstrual cycles, so this is likely not the cause.  She sees Dr. GCarlean Purlin [facility] for her GERD, has a h/o ulcers, and nissen fundoplication, and therefore, we recommended she f/u with Dr. GCarlean Purlfor evaluation and further work up.    Kahlee cannot tolerate  oral iron.  Due to this, and her recent ferritin level, we recommended that she receive IV iron.  This can start next  week on 12/20/2019.  I placed orders for feraheme.  Dr. Jana Hakim reviewed risks and benefits of this with patient and she agreed to proceed.  I let Verdene know that sometimes insurance will require the use of a different IV treatment that has different dosage and frequency.  I let her know that she will still start her iv iron treatment on Wednesday, but if insurance requires, she may receive a different product, which also means a different dosage, and overall number of treatments.  She understands this.    Bianka and I talked about her pap smear being overdue.  I recommended she f/u with her gynecologist.    Dominik has not yet started folic acid as recommended by her PCP.  I sent a script to her pharmacy for folic acid.  I encouraged her to pick it up.    Zykira wants to know what will help her not feel so poorly.  Dr. Jana Hakim suspects her feeling poorly is more so related to her rheumatologic issue and the inflammation in her body.  He recommended that she discuss this further with her rheumatologist  We will see Janet back next week to start IV iron.  We will recheck her iron studies in 6 weeks, and then we will also f/u with her in 6 months.  She was recommended to continue with the appropriate pandemic precautions. She knows to call for any questions that may arise between now and her next appointment.  We are happy to see her sooner if needed.  Total encounter time: 30 minutes*  Wilber Bihari, NP 12/13/19 12:13 PM Medical Oncology and Hematology [facility] [place], [place] [address] Tel. [phone]    Fax. [phone]   ADDENDUM: 59 year old [place] woman with a history of iron deficiency anemia, currently not anemic, without heavy periods, but status post Nissen fundoplication remotely and with intolerance of oral iron.  We discussed the fact  that the body does not have a mechanism to excrete iron and therefore the way we normally lose iron stores is bleeding.  She is premenopausal and is menstruating although not heavily.  She has had colonoscopy in the past but not a recent EGD.  She could have mild gastritis or other low-level lesion causing her to lose iron chronically.  Likely also she is not absorbing iron well given her prior surgery and in any case she cannot tolerate oral iron.  Since her hemoglobin is greater than 12 her fatigue is not due to anemia.  It is true that the body does have many enzymes that are iron dependent and if 1 is iron deficient even if not anemic there can be a feeling of malaise and if that is the case she will notice an improvement in her functional status after she receives her next iron infusion.    More likely however we are dealing with a primary rheumatologic issue and associated depression.  We will proceed with iron replacement as described above.  She has a good understanding of the possible toxicities side effects and complications of these infusions which she has tolerated well in the past.  We will make sure to follow to keep her iron stores at an adequate level.    Total encounter time 45 minutes.*  I personally saw this patient and performed a substantive portion of this encounter with the listed APP documented above.   Chauncey Cruel, MD Medical Oncology and Hematology [facility] [place],  [place] [address] Tel. [phone]    Fax. [phone]   *Total Encounter Time as defined by the Centers for Medicare and Medicaid Services includes, in addition to the face-to-face time of a patient visit (documented in the note above) non-face-to-face time: obtaining and reviewing outside history, ordering and reviewing medications, tests or procedures, care coordination (communications with other health care professionals or caregivers) and documentation in the  medical record.

## 2019-12-14 ENCOUNTER — Telehealth: Payer: Self-pay | Admitting: Family Medicine

## 2019-12-14 NOTE — Progress Notes (Signed)
°  Chronic Care Management   Note  12/14/2019 Name: Jo [name] MRN: [id number] DOB: Mar 28, 1961  Jo [name] is a 59 y.o. year old female who is a primary care patient of Koberlein, Steele Berg, MD. I reached out to [name] by phone today in response to a referral sent by [name]'s PCP, Caren Macadam, MD.   [name] was given information about Chronic Care Management services today including:  1. CCM service includes personalized support from designated clinical staff supervised by her physician, including individualized plan of care and coordination with other care providers 2. 24/7 contact phone numbers for assistance for urgent and routine care needs. 3. Service will only be billed when office clinical staff spend 20 minutes or more in a month to coordinate care. 4. Only one practitioner may furnish and bill the service in a calendar month. 5. The patient may stop CCM services at any time (effective at the end of the month) by phone call to the office staff.   Patient agreed to services and verbal consent obtained.   Follow up plan:   Raynicia Dukes [facility] Scheduler

## 2019-12-19 ENCOUNTER — Other Ambulatory Visit: Payer: Self-pay | Admitting: Adult Health

## 2019-12-20 ENCOUNTER — Other Ambulatory Visit: Payer: Self-pay

## 2019-12-20 ENCOUNTER — Inpatient Hospital Stay: Payer: PPO

## 2019-12-20 VITALS — BP 98/66 | HR 67 | Temp 98.0°F | Resp 16

## 2019-12-20 DIAGNOSIS — E611 Iron deficiency: Secondary | ICD-10-CM

## 2019-12-20 MED ORDER — SODIUM CHLORIDE 0.9 % IV SOLN
510.0000 mg | Freq: Once | INTRAVENOUS | Status: AC
  Administered 2019-12-20: 510 mg via INTRAVENOUS
  Filled 2019-12-20: qty 510

## 2019-12-20 MED ORDER — SODIUM CHLORIDE 0.9 % IV SOLN
Freq: Once | INTRAVENOUS | Status: AC
  Filled 2019-12-20: qty 250

## 2019-12-20 NOTE — Patient Instructions (Signed)

## 2019-12-21 DIAGNOSIS — M069 Rheumatoid arthritis, unspecified: Secondary | ICD-10-CM | POA: Insufficient documentation

## 2019-12-21 DIAGNOSIS — M797 Fibromyalgia: Secondary | ICD-10-CM | POA: Insufficient documentation

## 2019-12-23 ENCOUNTER — Other Ambulatory Visit: Payer: Self-pay | Admitting: Family Medicine

## 2019-12-23 DIAGNOSIS — E039 Hypothyroidism, unspecified: Secondary | ICD-10-CM

## 2019-12-25 DIAGNOSIS — R768 Other specified abnormal immunological findings in serum: Secondary | ICD-10-CM | POA: Diagnosis not present

## 2019-12-25 DIAGNOSIS — M2550 Pain in unspecified joint: Secondary | ICD-10-CM | POA: Diagnosis not present

## 2019-12-25 DIAGNOSIS — G9050 Complex regional pain syndrome I, unspecified: Secondary | ICD-10-CM | POA: Diagnosis not present

## 2019-12-25 DIAGNOSIS — R0602 Shortness of breath: Secondary | ICD-10-CM | POA: Diagnosis not present

## 2019-12-25 DIAGNOSIS — G8929 Other chronic pain: Secondary | ICD-10-CM | POA: Diagnosis not present

## 2019-12-25 DIAGNOSIS — R079 Chest pain, unspecified: Secondary | ICD-10-CM | POA: Diagnosis not present

## 2019-12-25 DIAGNOSIS — M546 Pain in thoracic spine: Secondary | ICD-10-CM | POA: Diagnosis not present

## 2019-12-25 DIAGNOSIS — M542 Cervicalgia: Secondary | ICD-10-CM | POA: Diagnosis not present

## 2019-12-25 DIAGNOSIS — M791 Myalgia, unspecified site: Secondary | ICD-10-CM | POA: Diagnosis not present

## 2019-12-25 DIAGNOSIS — M1990 Unspecified osteoarthritis, unspecified site: Secondary | ICD-10-CM | POA: Diagnosis not present

## 2019-12-25 DIAGNOSIS — K219 Gastro-esophageal reflux disease without esophagitis: Secondary | ICD-10-CM | POA: Diagnosis not present

## 2019-12-25 DIAGNOSIS — M359 Systemic involvement of connective tissue, unspecified: Secondary | ICD-10-CM | POA: Diagnosis not present

## 2019-12-25 DIAGNOSIS — M25551 Pain in right hip: Secondary | ICD-10-CM | POA: Diagnosis not present

## 2019-12-25 DIAGNOSIS — M160 Bilateral primary osteoarthritis of hip: Secondary | ICD-10-CM | POA: Diagnosis not present

## 2019-12-25 DIAGNOSIS — R5383 Other fatigue: Secondary | ICD-10-CM | POA: Diagnosis not present

## 2019-12-25 DIAGNOSIS — M549 Dorsalgia, unspecified: Secondary | ICD-10-CM | POA: Diagnosis not present

## 2019-12-25 DIAGNOSIS — I7300 Raynaud's syndrome without gangrene: Secondary | ICD-10-CM | POA: Diagnosis not present

## 2019-12-27 ENCOUNTER — Other Ambulatory Visit: Payer: Self-pay

## 2019-12-27 ENCOUNTER — Inpatient Hospital Stay: Payer: PPO | Attending: Adult Health

## 2019-12-27 VITALS — BP 105/66 | HR 78 | Temp 98.2°F | Resp 16

## 2019-12-27 DIAGNOSIS — E611 Iron deficiency: Secondary | ICD-10-CM | POA: Insufficient documentation

## 2019-12-27 MED ORDER — SODIUM CHLORIDE 0.9 % IV SOLN
510.0000 mg | Freq: Once | INTRAVENOUS | Status: AC
  Administered 2019-12-27: 510 mg via INTRAVENOUS
  Filled 2019-12-27: qty 510

## 2019-12-27 MED ORDER — SODIUM CHLORIDE 0.9 % IV SOLN
Freq: Once | INTRAVENOUS | Status: AC
  Filled 2019-12-27: qty 250

## 2019-12-27 NOTE — Patient Instructions (Signed)

## 2020-01-02 DIAGNOSIS — B353 Tinea pedis: Secondary | ICD-10-CM | POA: Diagnosis not present

## 2020-01-02 DIAGNOSIS — L309 Dermatitis, unspecified: Secondary | ICD-10-CM | POA: Diagnosis not present

## 2020-01-02 DIAGNOSIS — R208 Other disturbances of skin sensation: Secondary | ICD-10-CM | POA: Diagnosis not present

## 2020-01-02 DIAGNOSIS — L820 Inflamed seborrheic keratosis: Secondary | ICD-10-CM | POA: Diagnosis not present

## 2020-01-08 ENCOUNTER — Ambulatory Visit: Payer: PPO

## 2020-01-08 ENCOUNTER — Telehealth: Payer: PPO

## 2020-01-08 ENCOUNTER — Other Ambulatory Visit: Payer: Self-pay

## 2020-01-08 DIAGNOSIS — J302 Other seasonal allergic rhinitis: Secondary | ICD-10-CM

## 2020-01-08 DIAGNOSIS — K219 Gastro-esophageal reflux disease without esophagitis: Secondary | ICD-10-CM

## 2020-01-08 DIAGNOSIS — K589 Irritable bowel syndrome without diarrhea: Secondary | ICD-10-CM

## 2020-01-08 DIAGNOSIS — E039 Hypothyroidism, unspecified: Secondary | ICD-10-CM

## 2020-01-08 DIAGNOSIS — J4520 Mild intermittent asthma, uncomplicated: Secondary | ICD-10-CM

## 2020-01-08 DIAGNOSIS — N951 Menopausal and female climacteric states: Secondary | ICD-10-CM

## 2020-01-08 DIAGNOSIS — E611 Iron deficiency: Secondary | ICD-10-CM

## 2020-01-08 DIAGNOSIS — G9059 Complex regional pain syndrome I of other specified site: Secondary | ICD-10-CM

## 2020-01-08 DIAGNOSIS — R12 Heartburn: Secondary | ICD-10-CM

## 2020-01-08 DIAGNOSIS — E282 Polycystic ovarian syndrome: Secondary | ICD-10-CM

## 2020-01-08 DIAGNOSIS — E538 Deficiency of other specified B group vitamins: Secondary | ICD-10-CM

## 2020-01-08 NOTE — Chronic Care Management (AMB) (Signed)
Chronic Care Management Pharmacy  Name: Jo [name]  MRN: [id number] DOB: 11-30-1960  Initial Questions: 1. Have you seen any other providers since your last visit? NA 2. Any changes in your medicines or health? No   Chief Complaint/ HPI  Jo [name],  59 y.o. , female presents for their Initial CCM visit with the clinical pharmacist via telephone due to COVID-19 Pandemic.  PCP : Caren Macadam, MD  Their chronic conditions include: Asthma, hypothyroidism, iron deficiency, PCOS, folate deficiency, systemic lupus, GERD, nausea, GI spasms, chronic regional pain syndrome, menopausal symptoms, depression/ anxiety, allergic rhinitis, vitamin B12 deficiency  Office Visits: 11/29/2019- Patient presented for office visit with Dr. Micheline Rough, MD for follow up. Patient complained of feeling tired all the time. Patient reports not feeling well, ringing in ears, dry skind and hair falling out. Patient to obtain ANA, C-reactive protein, sedimentation rate for evaluation for fatigue. Patient unable to tolerate oral iron supplementation. Patient referred to hematology and discuss iron infusions. Patient to return for pending bloodwork.   Consult Visit: 12/13/2019- Hematology and Oncology- Patient presented to Ishmael Holter, NP for iron deficiency anemia and folate deficiency. Patient to begin IV iron infusion. Patient advised to start folic acid as recommended by PCP.    Medications: Outpatient Encounter Medications as of 01/08/2020  Medication Sig Note  . azelastine (ASTELIN) 0.1 % nasal spray Place 1 spray into both nostrils 2 (two) times daily. Use in each nostril as directed   . busPIRone (BUSPAR) 15 MG tablet Take 0.5 tablets (7.5 mg total) by mouth 2 (two) times daily.   . cholecalciferol (VITAMIN D) 1000 UNITS tablet Take 2,000 Units by mouth daily.   . clindamycin (CLEOCIN T) 1 % lotion Apply 1 application topically 2 (two) times daily.    . Cranberry 200 MG CAPS Take 200 mg by  mouth 2 (two) times daily. 09/25/2015: Received from: [place]: Take by mouth.  . cyanocobalamin (,VITAMIN B-12,) 1000 MCG/ML injection INJECT 1 ML INTO THE MUSCLE ONCE FOR 1 DOSE alternating every 2 weeks x 2 and then monthly (Patient taking differently: Inject 1,000 mcg into the muscle as directed. )   . estradiol (ESTRACE) 2 MG tablet Take 1 tablet (2 mg total) by mouth daily.   . fexofenadine (ALLEGRA) 180 MG tablet Take 180 mg by mouth daily.   . fexofenadine-pseudoephedrine (ALLEGRA-D 24) 180-240 MG 24 hr tablet Take 1 tablet by mouth at bedtime.   . fluticasone (CUTIVATE) 0.05 % cream Apply 1 application topically 2 (two) times daily as needed (rash).    . fluticasone (FLONASE) 50 MCG/ACT nasal spray SPRAY 2 SPRAYS INTO EACH NOSTRIL EVERY DAY (Patient taking differently: Place 2 sprays into both nostrils daily. )   . folic acid (FOLVITE) 1 MG tablet Take 1 tablet (1 mg total) by mouth daily.   Marland Kitchen HYDROmorphone (DILAUDID) 4 MG tablet Take 4 mg by mouth every 4 (four) hours as needed for moderate pain or severe pain.    . hydroxychloroquine (PLAQUENIL) 200 MG tablet Take 400 mg by mouth daily.   Marland Kitchen lamoTRIgine (LAMICTAL) 100 MG tablet Take 1 tablet (100 mg total) by mouth 2 (two) times daily.   Marland Kitchen levothyroxine (SYNTHROID) 125 MCG tablet TAKE 1 TABLET BY MOUTH EVERY DAY   . medroxyPROGESTERone (PROVERA) 2.5 MG tablet 1 tab alternating with 2 tabs daily (Patient taking differently: Take 2.5-5 mg by mouth as directed. Take 1 tab alternating with 2 tabs daily) 12/11/2019: Patient took 2 tablets  today  . metFORMIN (GLUCOPHAGE) 500 MG tablet Take 2 tabs q am, 1 tab qhs.   . methadone (DOLOPHINE) 10 MG tablet Take 10-20 mg by mouth in the morning, at noon, and at bedtime. Patient reports taking, 2 tablets in the morning, 2 tablets around noon, and 1 tablet at bedtime.   . Metoclopramide HCl 10 MG TBDP Take 1 tablet (10 mg total) by mouth 4 (four) times daily.   . montelukast (SINGULAIR) 10  MG tablet TAKE 1 TABLET BY MOUTH EVERYDAY AT BEDTIME   . Omeprazole-Sodium Bicarbonate (ZEGERID) 20-1100 MG CAPS capsule Take 1 capsule by mouth daily.   . ondansetron (ZOFRAN) 4 MG tablet Take 1 tablet (4 mg total) by mouth every 8 (eight) hours as needed for nausea.   . sertraline (ZOLOFT) 100 MG tablet Take 2 tablets (200 mg total) by mouth daily.   . sucralfate (CARAFATE) 1 g tablet Take 1 tablet (1 g total) by mouth 4 (four) times daily -  with meals and at bedtime. (Patient taking differently: Take 1 g by mouth 3 (three) times daily as needed (indigestion). )   . SYRINGE-NEEDLE, DISP, 3 ML (BD ECLIPSE SYRINGE) 25G X 5/8" 3 ML MISC Use as directed   . tiZANidine (ZANAFLEX) 4 MG capsule Take 12 mg by mouth at bedtime as needed for muscle spasms.  09/25/2015: Received from: [place]: Take 4 mg by mouth.  . topiramate (TOPAMAX) 25 MG tablet Take 25 mg by mouth as directed. Per patient: 1 tablet every morning and 3 tablets at bed time 09/25/2015: Received from: [place]: TAKE 1 TABLET EVERY MORNING AND 3 TABLETS AT BEDTIME  . erythromycin with ethanol (EMGEL) 2 % gel Apply topically 2 (two) times daily. (Patient not taking: Reported on 01/08/2020)   . ESTRADIOL ACETATE PO Take 2 mg by mouth daily. 2 mg tablet   . ketoconazole (NIZORAL) 2 % cream    . nystatin cream (MYCOSTATIN) Apply 1 application topically 2 (two) times daily. (Patient not taking: Reported on 01/08/2020)   . topiramate (TOPAMAX) 25 MG tablet TAKE 1 TABLET 5 TIMES A DAY   . triamcinolone cream (KENALOG) 0.1 %     Facility-Administered Encounter Medications as of 01/08/2020  Medication  . cyanocobalamin ((VITAMIN B-12)) injection 1,000 mcg    Current Diagnosis/Assessment:  Goals Addressed            This Visit's Progress   . Pharmacy Care Plan       CARE PLAN ENTRY  Current Barriers:  . Chronic Disease Management support, education, and care coordination needs related to  Asthma,  hypothyroidism, iron deficiency, PCOS, folate deficiency, systemic lupus, GERD, nausea, GI spasms, chronic regional pain syndrome, menopausal symptoms, depression/ anxiety, allergic rhinitis, vitamin B12 deficiency  Current Barriers:  . Polypharmacy; complex patient with multiple comorbidities  . Self-manages medications by organizing medications in empty pill bottles.  Does not use a pill box or other adherence strategies.   Uncontrolled asthma, treatment options limited by: not tolerating previous nebulizers and inhalers  . Current regimen: montelukast 10 mg, 1 tablet at bedtime Hypothyroidism . Current regimen: levothyroxine 143mcg, 1 tablet once daily  Iron deficiency:  . Current regimen: Ferumoxytol (FERAHEME) infusion  . Patient to return in 1 month after blood work.  PCOS . Current regimen: metformin 500mg , 2 tablets every morning and 1 tablet at bedtime Folate deficiency . Current regimen: folic acid 1 mg, 1 tablet once daily  Systemic lupus . Current regimen:  hydroxychloroquine 200mg , 2 tablets every morning  Acid reflux  Current regimen: Zegerid 20-1100mg , 1 capsule once daily    sucralfate 1g, 1 tablet four times daily with meals and at bedtime  Nausea/ spasms Current regimen:   Metoclopramide 10mg , 1 tablet four times daily (patient has been using as needed)  ondansetron (Zofran) 4mg , 1 tablet every eight hours as needed for nausea Complex regional pain syndrome Current regimen:   hydromorphone 4mg , 1 tablet every 4 hours as needed for moderate pain or severe pain   methadone 10mg , (Patient reports taking: 2 tablet in the morning, 2 tablets around noon, and 1 tab at bedtime).  topiramate 25mg , 1 tablet in the morning and 3 tablets at bedtime   tizanidine 4mg , 3 capsules at bedtime as needed for muscle spasms  Menopausal symptoms Current regimen:  . estradiol 2mg , 1 tablet once daily  . medroxyprogesterone 2.5mg , 1 tablet alternating with 2 tablets once daily    Depression/anxiety Current regimen:  . buspirone (Buspar) 15mg , 0.5 tablet (7.5mg ) twice daily  . sertraline 100mg , 2 tablets once daily . lamotrigine 100mg ,1 tablet twice daily  Allergic rhinitis Current regimen:  . azelastine (Astelin) 0.1% nasal spray, 1 spray into both nostrils twice daily  . Allegra, 180mg , 1 tablet at bedtime . fluticasone (Flonase) 24mcg/act nasal spray, 2 sprays into each nostril every day  Vitamin B12 deficiency . Current regimen: cyanocobalamin 1000 mcg/ml, inject 1 ML into muscle every 2 weeks for 2 doses and then once every month   Pharmacist Clinical Goal(s):  Marland Kitchen Over the next 7 days, patient will work with PharmD and provider towards optimized medication management.  . Patient to find and make appointment with a new gynecologist. Appropriate time to review medications for menopause.   Interventions: . Comprehensive medication review performed; medication list updated in electronic medical record . Inter-disciplinary care team collaboration . Instructed patient to continue all medications as instructed.  . Discussed non-pharmacological interventions for acid reflux. Take measures to prevent acid reflux, such as avoiding spicy foods, avoiding caffeine, avoid laying down a few hours after eating, and raising the head of the bed. . Discussed consistent intake of levothyroxine in regards to meals and other medications.   Patient Self Care Activities:  . Patient will take medications as prescribed . Patient will review quantities and report back within a week to review medication and transition to [facility] for adherence packaging and delivery.  Initial goal documentation       SDOH Interventions     [name] Recent Value  SDOH Interventions  SDOH Interventions for the Following Domains  Food Insecurity, Transportation  Food Insecurity Interventions  Other (Comment) [Not needed]  Transportation Interventions  Other (Comment) [Not needed]        Asthma  Patient notes shortness of breath has been the same since last time she saw Dr. Ethlyn Gallery. She notes still having a non- persistent cough.   Eosinophil count:   Lab Results  Component Value Date/Time   EOSPCT 0 12/13/2019 09:36 AM  %                               Eos (Absolute):  Lab Results  Component Value Date/Time   EOSABS 0.0 12/13/2019 09:36 AM   Tobacco Status:  Social History   Tobacco Use  Smoking Status Never Smoker  Smokeless Tobacco Never Used   Patient has failed these meds in past: patient states previous nebulizers and  inhalers have not worked for her (makes her shaky).   Patient is currently controlled on the following medications:   montelukast (Singulair) 10mg , 1 tablet daily at bedtime   Using maintenance inhaler regularly? No- does not use Frequency of rescue inhaler use:  NA- does not use   Plan Continue current medications.   Hypothyroidism   TSH  Date Value Ref Range Status  11/15/2019 2.19 0.35 - 4.50 uIU/mL Final    Patient has failed these meds in past: none  Patient is currently controlled on the following medications:   levothyroxine 126mcg, 1 tablet once daily   We discussed:  persistant administration of levothyroxine (regards to food and other medications)  - Patient reports taking in the morning with her coffee.   Plan Continue current medications.   Iron deficiency   Patient has failed these meds in past: oral supplement Patient is currently controlled on the following medications:   Ferumoxytol [facility]) infusion   Hemoglobin & Hematocrit     Component Value Date/Time   HGB 13.6 12/13/2019 0936   HCT 41.8 12/13/2019 0936   Iron/TIBC/Ferritin/ %Sat    Component Value Date/Time   IRON 82 12/13/2019 0936   TIBC 495 (H) 12/13/2019 0936   FERRITIN 19 12/13/2019 0936   IRONPCTSAT 16 (L) 12/13/2019 0936   Plan Patient states she will be going back in 1 month after she obtains blood work.  Continue  current medications.   PCOS   Recent Relevant Labs: Lab Results  Component Value Date/Time   HGBA1C 5.6 11/15/2019 09:49 AM    Patient has failed these meds in past: none   Patient is currently controlled on the following medications:   metformin 500mg , 2 tablets every morning and 1 tablet at bedtime   We discussed: diet and exercise extensively   Patient reports not eating as much sweets. She reports eating pears, grapes, dates   Plan Managed by Dr. Wilber Bihari. Continue current medications  Folate deficiency  Patient is currently managed on the following medications:   folic acid 1 mg, 1 tablet once daily   Folate  12/13/19: 26.3 11/15/19: 6.0  Plan Continue current medications.   Systemic lupus  Patient stated she was recently diagnosed with lupus (possilby RA as well?)   Patient has failed these meds in past: none  Patient is currently controlled on the following medications:  - hydroxychloroquine 200mg , 2 tablets every morning   Plan  Managed by Dr. [name].  Continue current medications.   GI health (GERD, nausea, GI spasms)   Patient reports using sucralfate only as needed for reflux sx. Patient reports using about 1 to 2x last year.   Patient has failed these meds in past:  Patient is currently controlled on the following medications:   Zegerid 20-1100mg , 1 capsule once daily    sucralfate 1g, 1 tablet four times daily with meals and at bedtime   Metoclopramide 10mg , 1 tablet four times daily (patient has been using as needed)  ondansetron (Zofran) 4mg , 1 tablet every eight hours as needed for nausea  We discussed:  non-pharmacological interventions for acid reflux. Take measures to prevent acid reflux, such as avoiding spicy foods, avoiding caffeine, avoid laying down a few hours after eating, and raising the head of the bed.  Discussed risks of long- term use of PPIs.   Plan Managed by Dr. Silvano Rusk. Continue current  medications  Complex regional pain syndrome   Patient reports she uses hydromorphone 3 to 4 days as  needed. Patient denies drowsiness with current medications.   Patient has failed these meds in past: patient states she has tried multiple treatments; she states she has been in several studies and states she does not want to participate in anymore trials.   Patient is currently controlled on the following medications:   hydromorphone 4mg , 1 tablet every 4 hours as needed for moderate pain or severe pain   methadone 10mg , (Patient reports taking: 2 tablet in the morning, 2 tablets around noon, and 1 tab at bedtime).  topiramate 25mg , 1 tablet in the morning and 3 tablets at bedtime   tizanidine 4mg , 3 capsules at bedtime as needed for muscle spasms   Plan Managed by Dr. Leone Payor . Patient to follow up (has appt on 28th) Continue current medications  Menopausal symptoms   Patient stated taking this for about 10 years now. Patient is in process of finding a new gynecologist.   Patient has failed these meds in past: none Patient is currently controlled on the following medications:  - estradiol 2mg , 1 tablet once daily  - medroxyprogesterone 2.5mg , 1 tablet alternating with 2 tablets once daily   Plan Patient to obtain a new gynecologist and plans to discuss duration of hormones.   Depression/ anxiety   Patient has failed these meds in past: patient stated trying several medications and "did not do well" on them.   Patient is currently controlled on the following medications:  - buspirone (Buspar) 15mg , 0.5 tablet (7.5mg ) twice daily  - sertraline 100mg , 2 tablets once daily - lamotrigine 100mg ,1 tablet twice daily   Plan Continue current medications.  Allergic rhinitis   Patient stated still having some symptoms. Patient mentioned using azelastine nasal spray once daily.   Patient has failed these meds in past: none  Patient is currently controlled on the following  medications:  - azelastine (Astelin) 0.1% nasal spray, 1 spray into both nostrils twice daily  - Allegra, 180mg , 1 tablet at bedtime - fluticasone (Flonase) 32mcg/act nasal spray, 2 sprays into each nostril every day   Plan Patient to increase azelastine and use as indicated on directions (BID).  Continue current medications  Vitamin B-12 deficiency   Patient has failed these meds in past: none Patient is currently controlled on the following medications:  - cyanocobalamin 1000 mcg/ml, inject 1 ML into muscle every 2 weeks for 2 doses and then once every month  Vitamin B12: 629 (12/13/2019)   Plan Continue current medications  Medication Management  Patient organizes medications: patient notes she uses several empty pill bottles, and puts daily medications in them. Patient notes spending time to do this. Assessed interest in adherence packaging- patient agreed with services.   Verbal consent obtained for [facility] enhanced pharmacy services (medication synchronization, adherence packaging, delivery coordination). A medication sync plan was created to allow patient to get all medications delivered once every 30 to 90 days per patient preference. Patient understands they have freedom to choose pharmacy and clinical pharmacist will coordinate care between all prescribers and [facility].   Follow up Follow up visit with PharmD for Friday to review quantities of medications and coordinate transition into adherence packaging Will conduct general telephone calls for periodic check-ins before next visit.   Anson Crofts, PharmD Clinical Pharmacist [facility] [phone]

## 2020-01-11 ENCOUNTER — Telehealth: Payer: Self-pay | Admitting: Family Medicine

## 2020-01-11 NOTE — Telephone Encounter (Signed)
Pt stated she is suppose to finish her Pharmacist call this Friday 4/23 at 1:00pm. I did not see an appt for that time but did see she had one on 4/19. Pt explained they did not get to finish and the pharmacist told her she would call to finish tomorrow. Pt wanted to make her aware that she is needing to r/s due to her dog being sick and having to take her to the vet.   Pt can be reached at [phone]

## 2020-01-11 NOTE — Telephone Encounter (Signed)
Contacted patient and rescheduled visit for Tuesday at 1 pm for PharmD visit.

## 2020-01-16 ENCOUNTER — Other Ambulatory Visit: Payer: Self-pay

## 2020-01-16 ENCOUNTER — Ambulatory Visit: Payer: PPO

## 2020-01-17 DIAGNOSIS — M791 Myalgia, unspecified site: Secondary | ICD-10-CM | POA: Diagnosis not present

## 2020-01-17 DIAGNOSIS — G90512 Complex regional pain syndrome I of left upper limb: Secondary | ICD-10-CM | POA: Diagnosis not present

## 2020-01-17 DIAGNOSIS — G894 Chronic pain syndrome: Secondary | ICD-10-CM | POA: Diagnosis not present

## 2020-01-17 DIAGNOSIS — M542 Cervicalgia: Secondary | ICD-10-CM | POA: Diagnosis not present

## 2020-01-17 NOTE — Patient Instructions (Addendum)
Visit Information  Goals Addressed            This Visit's Progress   . Pharmacy Care Plan       CARE PLAN ENTRY  Current Barriers:  . Chronic Disease Management support, education, and care coordination needs related to  Asthma, hypothyroidism, iron deficiency, PCOS, folate deficiency, systemic lupus, GERD, nausea, GI spasms, chronic regional pain syndrome, menopausal symptoms, depression/ anxiety, allergic rhinitis, vitamin B12 deficiency  Current Barriers:  . Polypharmacy; complex patient with multiple comorbidities  . Self-manages medications by organizing medications in empty pill bottles.  Does not use a pill box or other adherence strategies.   Uncontrolled asthma, treatment options limited by: not tolerating previous nebulizers and inhalers  . Current regimen: montelukast 10 mg, 1 tablet at bedtime Hypothyroidism . Current regimen: levothyroxine 1100mcg, 1 tablet once daily  Iron deficiency:  . Current regimen: Ferumoxytol (FERAHEME) infusion  . Patient to return in 1 month after blood work.  PCOS . Current regimen: metformin 500mg , 2 tablets every morning and 1 tablet at bedtime Folate deficiency . Current regimen: folic acid 1 mg, 1 tablet once daily  Systemic lupus . Current regimen:  hydroxychloroquine 200mg , 2 tablets every morning  Acid reflux  Current regimen: Zegerid 20-1100mg , 1 capsule once daily    sucralfate 1g, 1 tablet four times daily with meals and at bedtime  Nausea/ spasms Current regimen:   Metoclopramide 10mg , 1 tablet four times daily (patient has been using as needed)  ondansetron (Zofran) 4mg , 1 tablet every eight hours as needed for nausea Complex regional pain syndrome Current regimen:   hydromorphone 4mg , 1 tablet every 4 hours as needed for moderate pain or severe pain   methadone 10mg , (Patient reports taking: 2 tablet in the morning, 2 tablets around noon, and 1 tab at bedtime).  topiramate 25mg , 1 tablet in the morning and 3  tablets at bedtime   tizanidine 4mg , 3 capsules at bedtime as needed for muscle spasms  Menopausal symptoms Current regimen:  . estradiol 2mg , 1 tablet once daily  . medroxyprogesterone 2.5mg , 1 tablet alternating with 2 tablets once daily  Depression/anxiety Current regimen:  . buspirone (Buspar) 15mg , 0.5 tablet (7.5mg ) twice daily  . sertraline 100mg , 2 tablets once daily . lamotrigine 100mg ,1 tablet twice daily  Allergic rhinitis Current regimen:  . azelastine (Astelin) 0.1% nasal spray, 1 spray into both nostrils twice daily  . Allegra, 180mg , 1 tablet at bedtime . fluticasone (Flonase) 82mcg/act nasal spray, 2 sprays into each nostril every day  Vitamin B12 deficiency . Current regimen: cyanocobalamin 1000 mcg/ml, inject 1 ML into muscle every 2 weeks for 2 doses and then once every month   Pharmacist Clinical Goal(s):  Marland Kitchen Over the next 7 days, patient will work with PharmD and provider towards optimized medication management.  . Patient to find and make appointment with a new gynecologist. Appropriate time to review medications for menopause.   Interventions: . Comprehensive medication review performed; medication list updated in electronic medical record . Inter-disciplinary care team collaboration . Instructed patient to continue all medications as instructed.  . Discussed non-pharmacological interventions for acid reflux. Take measures to prevent acid reflux, such as avoiding spicy foods, avoiding caffeine, avoid laying down a few hours after eating, and raising the head of the bed. . Discussed consistent intake of levothyroxine in regards to meals and other medications.   Patient Self Care Activities:  . Patient will take medications as prescribed . Patient will review quantities and report back  within a week to review medication and transition to [facility] for adherence packaging and delivery.  Initial goal documentation        Ms. Barbati was given  information about Chronic Care Management services today including:  1. CCM service includes personalized support from designated clinical staff supervised by her physician, including individualized plan of care and coordination with other care providers 2. 24/7 contact phone numbers for assistance for urgent and routine care needs. 3. Standard insurance, coinsurance, copays and deductibles apply for chronic care management only during months in which we provide at least 20 minutes of these services. Most insurances cover these services at 100%, however patients may be responsible for any copay, coinsurance and/or deductible if applicable. This service may help you avoid the need for more expensive face-to-face services. 4. Only one practitioner may furnish and bill the service in a calendar month. 5. The patient may stop CCM services at any time (effective at the end of the month) by phone call to the office staff.   Patient agreed to services and verbal consent obtained.   Verbal consent obtained for [facility] enhanced pharmacy services (medication synchronization, adherence packaging, delivery coordination). A medication sync plan was created to allow patient to get all medications delivered once every 30 to 90 days per patient preference. Patient understands they have freedom to choose pharmacy and clinical pharmacist will coordinate care between all prescribers and [facility].   The patient verbalized understanding of instructions provided today and agreed to receive a mailed copy of patient instruction and/or educational materials. Telephone follow up appointment with pharmacy team member scheduled for: with 1 week to review medications quantities  Anson Crofts, PharmD Clinical Pharmacist [facility] [place] [phone]    Asthma, Adult  Asthma is a long-term (chronic) condition in which the airways get tight and narrow. The airways are the  breathing passages that lead from the nose and mouth down into the lungs. A person with asthma will have times when symptoms get worse. These are called asthma attacks. They can cause coughing, whistling sounds when you breathe (wheezing), shortness of breath, and chest pain. They can make it hard to breathe. There is no cure for asthma, but medicines and lifestyle changes can help control it. There are many things that can bring on an asthma attack or make asthma symptoms worse (triggers). Common triggers include:  Mold.  Dust.  Cigarette smoke.  Cockroaches.  Things that can cause allergy symptoms (allergens). These include animal skin flakes (dander) and pollen from trees or grass.  Things that pollute the air. These may include household cleaners, wood smoke, smog, or chemical odors.  Cold air, weather changes, and wind.  Crying or laughing hard.  Stress.  Certain medicines or drugs.  Certain foods such as dried fruit, potato chips, and grape juice.  Infections, such as a cold or the flu.  Certain medical conditions or diseases.  Exercise or tiring activities. Asthma may be treated with medicines and by staying away from the things that cause asthma attacks. Types of medicines may include:  Controller medicines. These help prevent asthma symptoms. They are usually taken every day.  Fast-acting reliever or rescue medicines. These quickly relieve asthma symptoms. They are used as needed and provide short-term relief.  Allergy medicines if your attacks are brought on by allergens.  Medicines to help control the body's defense (immune) system. Follow these instructions at home: Avoiding triggers in your home  Change your heating  and air conditioning filter often.  Limit your use of fireplaces and wood stoves.  Get rid of pests (such as roaches and mice) and their droppings.  Throw away plants if you see mold on them.  Clean your floors. Dust regularly. Use cleaning  products that do not smell.  Have someone vacuum when you are not home. Use a vacuum cleaner with a HEPA filter if possible.  Replace carpet with wood, tile, or vinyl flooring. Carpet can trap animal skin flakes and dust.  Use allergy-proof pillows, mattress covers, and box spring covers.  Wash bed sheets and blankets every week in hot water. Dry them in a dryer.  Keep your bedroom free of any triggers.  Avoid pets and keep windows closed when things that cause allergy symptoms are in the air.  Use blankets that are made of polyester or cotton.  Clean bathrooms and kitchens with bleach. If possible, have someone repaint the walls in these rooms with mold-resistant paint. Keep out of the rooms that are being cleaned and painted.  Wash your hands often with soap and water. If soap and water are not available, use hand sanitizer.  Do not allow anyone to smoke in your home. General instructions  Take over-the-counter and prescription medicines only as told by your doctor. ? Talk with your doctor if you have questions about how or when to take your medicines. ? Make note if you need to use your medicines more often than usual.  Do not use any products that contain nicotine or tobacco, such as cigarettes and e-cigarettes. If you need help quitting, ask your doctor.  Stay away from secondhand smoke.  Avoid doing things outdoors when allergen counts are high and when air quality is low.  Wear a ski mask when doing outdoor activities in the winter. The mask should cover your nose and mouth. Exercise indoors on cold days if you can.  Warm up before you exercise. Take time to cool down after exercise.  Use a peak flow meter as told by your doctor. A peak flow meter is a tool that measures how well the lungs are working.  Keep track of the peak flow meter's readings. Write them down.  Follow your asthma action plan. This is a written plan for taking care of your asthma and treating your  attacks.  Make sure you get all the shots (vaccines) that your doctor recommends. Ask your doctor about a flu shot and a pneumonia shot.  Keep all follow-up visits as told by your doctor. This is important. Contact a doctor if:  You have wheezing, shortness of breath, or a cough even while taking medicine to prevent attacks.  The mucus you cough up (sputum) is thicker than usual.  The mucus you cough up changes from clear or white to yellow, green, gray, or bloody.  You have problems from the medicine you are taking, such as: ? A rash. ? Itching. ? Swelling. ? Trouble breathing.  You need reliever medicines more than 2-3 times a week.  Your peak flow reading is still at 50-79% of your personal best after following the action plan for 1 hour.  You have a fever. Get help right away if:  You seem to be worse and are not responding to medicine during an asthma attack.  You are short of breath even at rest.  You get short of breath when doing very little activity.  You have trouble eating, drinking, or talking.  You have chest pain or  tightness.  You have a fast heartbeat.  Your lips or fingernails start to turn blue.  You are light-headed or dizzy, or you faint.  Your peak flow is less than 50% of your personal best.  You feel too tired to breathe normally. Summary  Asthma is a long-term (chronic) condition in which the airways get tight and narrow. An asthma attack can make it hard to breathe.  Asthma cannot be cured, but medicines and lifestyle changes can help control it.  Make sure you understand how to avoid triggers and how and when to use your medicines. This information is not intended to replace advice given to you by your health care provider. Make sure you discuss any questions you have with your health care provider. Document Revised: 11/10/2018 Document Reviewed: 10/12/2016 Elsevier Patient Education  2020 [employer].

## 2020-01-18 NOTE — Chronic Care Management (AMB) (Signed)
Chronic Care Management Pharmacy  Name: ALLISYN SCHEIBNER  MRN: [id number] DOB: October 30, 1960  [name],  59 y.o. , female presents for follow-up CCM visit with the clinical pharmacist via telephone due to [place]-  PCP : Caren Macadam, MD  Follow-up visit scheduled per patient request to coordinate medications and delivery.   Verbal consent obtained for [facility] enhanced pharmacy services (medication synchronization, adherence packaging, delivery coordination). A medication sync plan was created to allow patient to get all medications delivered once every 30 to 90 days per patient preference. Patient understands they have freedom to choose pharmacy and clinical pharmacist will coordinate care between all prescribers and [facility].  Reviewed chart for medication changes ahead of medication coordination call. No OVs, Consults, or hospital visits since last care coordination Pharmacist visit. No medication changes indicated.  BP Readings from Last 3 Encounters:  12/27/19 105/66  12/20/19 98/66  12/13/19 129/78    Lab Results  Component Value Date   HGBA1C 5.6 11/15/2019     Patient obtains medications through Adherence Packaging  90 Days   Patient is due for next adherence delivery on: 01/22/2020.  This delivery to include: Lamotrigine 100mg  1 tablet twice daily  Ondansetron 4mg  1 tablet every 8 hours as needed  Ketoconazole 2% cream Apply to affected area as needed  BD Integra 3ML, 25Gx 5/8"  Use as directed  Montelukast 10mg   1 tablet at bedtime   Patient declined the following medications and fills coordinated to get medications sync date: 03/23/2020 Medication Last fill date Next fill date  Hydromorphone 4mg  3/18/201, 30DS In vial; uses PRN; patient will call when needed  Methadone 10mg   01/10/2020, 30DS In vial; 02/08/2020  Vitamin B-12 inj 11/29/2019, 63DS (has 10 doses) 08/20/2020  Levothyroxine 119mcg 12/25/19, 90DS 03/25/2020  Topiramate 25mg  12/23/2019,  90DS 03/23/2020  Sertraline 100mg   11/07/2019, 90DS 02/04/2020  Buspirone 15mg   11/07/2019, 90DS 5/162021  Metformin 500mg   Request new RX with correct directions.  09/26/2019, 90S (has half a bottle left)  02/15/2020  Medroxyprogesterone 2.5mg  12/10/2019 03/11/2020; in vial  Estradiol 2mg  11/28/2019 02/25/2020  Metoclopramide 10mg  12/14/2019- expired  Will call when needed; use PRN  Fluticasone nasal spray  Will call when needed; use PRN  Astelin nasal spray  Will call when needed; use PRN  Hydroxychloroquine 200mg   12/25/2019, 30DS New med- Patient states she has major nausea and migraines. Pending call back from rheumatologist.   Clindamycin lotion  Will call when needed  Fluticasone 0.05% cream  Will call when needed  Triamcinolone 0.1% cream  Will call when needed  Folic acid 1mg   12/13/2019, 90DS 03/14/2020  Vitamin D3 2000 units  Will call when needed  Cranberry 4200mg    Patient to pick up on her own  Omeprazole/ sodium bicarbonate   Assess price with [facility]   Sucralfate 1g  Will call when needed  Allegra (OTC)  Patient to pick up on her own  Tizanidine 4mg   10/19/2019, 90S (has over 100 tabs) 02/20/2020    Patient needs refills for the following and contacted the following prescribers:  Micheline Rough, MD  Vitamin B-12 inj  Levothyroxine 178mcg  Sertraline 100mg   Buspirone 15mg    Metformin 500mg    Lamotrigine 100mg   Montelukast 10mg    Medroxyprogesterone 2.5mg   Estradiol 2mg   BD Integra 3ML, 25Gx 5/8"   Metoclopramide 10mg   Ondansetron 4mg   Leone Payor, MD  Hydromorphone 4mg   Methadone 10mg    Topiramate 25mg   Tizanidine 4mg    Lennie Odor, PA  Fluticasone  0.05% cream  Clindamycin lotion  Ketoconazole 2% cream  Triamcinolone 0.1% cream  [name], MD  Hydroxychloroquine 200mg   (patient called office and is pending call back- patient stated feeling nauseated since starting this med).   Wilber Bihari, NP  Folic acid 1mg     OTC/  supplements  Fluticasone nasal spray  Astelin nasal spray  Vitamin D3 2000 units  Cranberry 4200mg    Omeprazole/ sodium bicarbonate   Sucralfate 1g  Allegra (OTC)   Confirmed delivery date of 01/22/2020, advised patient that pharmacy will contact them the morning of delivery or day before delivery.   Anson Crofts, PharmD Clinical Pharmacist [facility] [place] [phone]

## 2020-01-22 NOTE — Chronic Care Management (AMB) (Signed)
Received message Jo [name] from [facility]. Pharmacy has not received prescriptions coordinated to be delivered today.   Called patient and advised delivery will be postponed until tomorrow. Patient understood and  requested prescriptions [facility] from [facility] to [facility].   Coordinated with [facility] to obtain transfers from [facility].

## 2020-01-22 NOTE — Chronic Care Management (AMB) (Signed)
Returned patient's missed call. Patient was calling to make note of methotrexate rx to be called in by Dr. Teodoro Spray office.  She would like this delivered with upcoming delivery.   Coordinated medication delivery with [facility].

## 2020-01-23 ENCOUNTER — Telehealth: Payer: Self-pay

## 2020-01-23 DIAGNOSIS — F341 Dysthymic disorder: Secondary | ICD-10-CM

## 2020-01-23 DIAGNOSIS — J302 Other seasonal allergic rhinitis: Secondary | ICD-10-CM

## 2020-01-23 MED ORDER — MONTELUKAST SODIUM 10 MG PO TABS: ORAL_TABLET | ORAL | 0 refills | Status: DC

## 2020-01-23 MED ORDER — LAMOTRIGINE 100 MG PO TABS: 100.0000 mg | ORAL_TABLET | Freq: Two times a day (BID) | ORAL | 0 refills | Status: DC

## 2020-01-23 NOTE — Telephone Encounter (Signed)
I am working on getting patient transitioned to adherence packaging with [facility]. We were able to obtain transfers from [facility] for numerous of her medications.   However, can you send refills sent to [facility] for 90 day supply for:  Lamotrigine 100mg , 1 tablet twice daily Montelukast 10mg , 1 tablet at bedtime.   We have coordinated delivery for today (01/23/2020).  Thanks!

## 2020-01-23 NOTE — Telephone Encounter (Signed)
Rx done. 

## 2020-01-26 ENCOUNTER — Other Ambulatory Visit: Payer: Self-pay | Admitting: Family Medicine

## 2020-01-26 DIAGNOSIS — N951 Menopausal and female climacteric states: Secondary | ICD-10-CM

## 2020-01-26 DIAGNOSIS — F341 Dysthymic disorder: Secondary | ICD-10-CM

## 2020-01-26 DIAGNOSIS — K589 Irritable bowel syndrome without diarrhea: Secondary | ICD-10-CM

## 2020-01-26 DIAGNOSIS — E282 Polycystic ovarian syndrome: Secondary | ICD-10-CM

## 2020-01-26 DIAGNOSIS — J302 Other seasonal allergic rhinitis: Secondary | ICD-10-CM

## 2020-01-26 DIAGNOSIS — E039 Hypothyroidism, unspecified: Secondary | ICD-10-CM

## 2020-01-26 MED ORDER — BUSPIRONE HCL 15 MG PO TABS: 7.5000 mg | ORAL_TABLET | Freq: Two times a day (BID) | ORAL | 3 refills | Status: DC

## 2020-01-26 MED ORDER — "BD ECLIPSE SYRINGE 25G X 5/8"" 3 ML MISC": 0 refills | Status: DC

## 2020-01-26 MED ORDER — METFORMIN HCL 500 MG PO TABS: ORAL_TABLET | ORAL | 3 refills | Status: DC

## 2020-01-26 MED ORDER — MONTELUKAST SODIUM 10 MG PO TABS: ORAL_TABLET | ORAL | 3 refills | Status: DC

## 2020-01-26 MED ORDER — ESTRADIOL 2 MG PO TABS: 2.0000 mg | ORAL_TABLET | Freq: Every day | ORAL | 3 refills | Status: DC

## 2020-01-26 MED ORDER — METOCLOPRAMIDE HCL 10 MG PO TBDP: 10.0000 mg | ORAL_TABLET | Freq: Four times a day (QID) | ORAL | 3 refills | Status: DC

## 2020-01-26 MED ORDER — SERTRALINE HCL 100 MG PO TABS: 200.0000 mg | ORAL_TABLET | Freq: Every day | ORAL | 3 refills | Status: DC

## 2020-01-26 MED ORDER — ONDANSETRON HCL 4 MG PO TABS: 4.0000 mg | ORAL_TABLET | Freq: Three times a day (TID) | ORAL | 3 refills | Status: DC | PRN

## 2020-01-26 MED ORDER — MEDROXYPROGESTERONE ACETATE 2.5 MG PO TABS: ORAL_TABLET | ORAL | 3 refills | Status: DC

## 2020-01-26 MED ORDER — LAMOTRIGINE 100 MG PO TABS: 100.0000 mg | ORAL_TABLET | Freq: Two times a day (BID) | ORAL | 3 refills | Status: DC

## 2020-01-26 MED ORDER — LEVOTHYROXINE SODIUM 125 MCG PO TABS: 125.0000 ug | ORAL_TABLET | Freq: Every day | ORAL | 3 refills | Status: DC

## 2020-01-26 NOTE — Progress Notes (Signed)
done

## 2020-02-05 ENCOUNTER — Other Ambulatory Visit: Payer: Self-pay | Admitting: Family Medicine

## 2020-02-05 DIAGNOSIS — F341 Dysthymic disorder: Secondary | ICD-10-CM

## 2020-02-06 ENCOUNTER — Other Ambulatory Visit: Payer: Self-pay | Admitting: *Deleted

## 2020-02-06 DIAGNOSIS — E611 Iron deficiency: Secondary | ICD-10-CM

## 2020-02-07 ENCOUNTER — Other Ambulatory Visit: Payer: Self-pay

## 2020-02-07 ENCOUNTER — Inpatient Hospital Stay: Payer: PPO | Attending: Adult Health

## 2020-02-07 DIAGNOSIS — E611 Iron deficiency: Secondary | ICD-10-CM | POA: Diagnosis not present

## 2020-02-07 LAB — CBC WITH DIFFERENTIAL (CANCER CENTER ONLY)
Abs Immature Granulocytes: 0.01 10*3/uL (ref 0.00–0.07)
Basophils Absolute: 0 10*3/uL (ref 0.0–0.1)
Basophils Relative: 1 %
Eosinophils Absolute: 0.2 10*3/uL (ref 0.0–0.5)
Eosinophils Relative: 4 %
HCT: 37.4 % (ref 36.0–46.0)
Hemoglobin: 12.1 g/dL (ref 12.0–15.0)
Immature Granulocytes: 0 %
Lymphocytes Relative: 32 %
Lymphs Abs: 1.5 10*3/uL (ref 0.7–4.0)
MCH: 30 pg (ref 26.0–34.0)
MCHC: 32.4 g/dL (ref 30.0–36.0)
MCV: 92.8 fL (ref 80.0–100.0)
Monocytes Absolute: 0.3 10*3/uL (ref 0.1–1.0)
Monocytes Relative: 6 %
Neutro Abs: 2.7 10*3/uL (ref 1.7–7.7)
Neutrophils Relative %: 57 %
Platelet Count: 224 10*3/uL (ref 150–400)
RBC: 4.03 MIL/uL (ref 3.87–5.11)
RDW: 13.4 % (ref 11.5–15.5)
WBC Count: 4.8 10*3/uL (ref 4.0–10.5)
nRBC: 0 % (ref 0.0–0.2)

## 2020-02-07 LAB — FERRITIN: Ferritin: 239 ng/mL (ref 11–307)

## 2020-02-21 DIAGNOSIS — M791 Myalgia, unspecified site: Secondary | ICD-10-CM | POA: Diagnosis not present

## 2020-02-21 DIAGNOSIS — G8929 Other chronic pain: Secondary | ICD-10-CM | POA: Diagnosis not present

## 2020-02-21 DIAGNOSIS — M1990 Unspecified osteoarthritis, unspecified site: Secondary | ICD-10-CM | POA: Diagnosis not present

## 2020-02-21 DIAGNOSIS — R768 Other specified abnormal immunological findings in serum: Secondary | ICD-10-CM | POA: Diagnosis not present

## 2020-02-21 DIAGNOSIS — K219 Gastro-esophageal reflux disease without esophagitis: Secondary | ICD-10-CM | POA: Diagnosis not present

## 2020-02-21 DIAGNOSIS — R5383 Other fatigue: Secondary | ICD-10-CM | POA: Diagnosis not present

## 2020-02-21 DIAGNOSIS — G9050 Complex regional pain syndrome I, unspecified: Secondary | ICD-10-CM | POA: Diagnosis not present

## 2020-02-21 DIAGNOSIS — M542 Cervicalgia: Secondary | ICD-10-CM | POA: Diagnosis not present

## 2020-02-21 DIAGNOSIS — I7300 Raynaud's syndrome without gangrene: Secondary | ICD-10-CM | POA: Diagnosis not present

## 2020-02-21 DIAGNOSIS — M359 Systemic involvement of connective tissue, unspecified: Secondary | ICD-10-CM | POA: Diagnosis not present

## 2020-02-21 DIAGNOSIS — M329 Systemic lupus erythematosus, unspecified: Secondary | ICD-10-CM | POA: Diagnosis not present

## 2020-02-23 ENCOUNTER — Other Ambulatory Visit: Payer: Self-pay | Admitting: Family Medicine

## 2020-02-23 DIAGNOSIS — N951 Menopausal and female climacteric states: Secondary | ICD-10-CM

## 2020-03-05 DIAGNOSIS — L710 Perioral dermatitis: Secondary | ICD-10-CM | POA: Diagnosis not present

## 2020-03-05 DIAGNOSIS — L820 Inflamed seborrheic keratosis: Secondary | ICD-10-CM | POA: Diagnosis not present

## 2020-03-07 ENCOUNTER — Other Ambulatory Visit: Payer: Self-pay | Admitting: Family Medicine

## 2020-03-07 DIAGNOSIS — N951 Menopausal and female climacteric states: Secondary | ICD-10-CM

## 2020-03-08 ENCOUNTER — Ambulatory Visit: Payer: Self-pay

## 2020-03-08 DIAGNOSIS — E039 Hypothyroidism, unspecified: Secondary | ICD-10-CM

## 2020-03-08 DIAGNOSIS — F341 Dysthymic disorder: Secondary | ICD-10-CM

## 2020-03-08 NOTE — Chronic Care Management (AMB) (Signed)
Patient called to review OTC medications since she could not remember which ones where agreed to be added in packaging.   Reviewed OTC pricing with patient. Patient to review current supply.   Patient aware of medication sync call to be expected on 03/13/2020 for medication review prior to upcoming packaging delivery.

## 2020-03-17 ENCOUNTER — Other Ambulatory Visit: Payer: Self-pay | Admitting: Family Medicine

## 2020-03-17 DIAGNOSIS — E039 Hypothyroidism, unspecified: Secondary | ICD-10-CM

## 2020-03-20 ENCOUNTER — Telehealth: Payer: Self-pay

## 2020-03-20 ENCOUNTER — Other Ambulatory Visit: Payer: Self-pay | Admitting: Family Medicine

## 2020-03-20 DIAGNOSIS — M2550 Pain in unspecified joint: Secondary | ICD-10-CM | POA: Diagnosis not present

## 2020-03-20 DIAGNOSIS — J302 Other seasonal allergic rhinitis: Secondary | ICD-10-CM

## 2020-03-20 MED ORDER — FLUTICASONE PROPIONATE 50 MCG/ACT NA SUSP: 2.0000 | Freq: Every day | NASAL | 5 refills | Status: DC

## 2020-03-20 MED ORDER — "SYRINGE 25G X 1-1/2"" 3 ML MISC": 3 refills | Status: DC

## 2020-03-20 NOTE — Telephone Encounter (Signed)
done

## 2020-03-20 NOTE — Chronic Care Management (AMB) (Signed)
Reviewed chart for medication changes ahead of medication coordination call.  No OVs, Consults, or hospital visits since last care coordination call  No medication changes indicated.   BP Readings from Last 3 Encounters:  12/27/19 105/66  12/20/19 98/66  12/13/19 129/78    Lab Results  Component Value Date   HGBA1C 5.6 11/15/2019     Patient obtains medications through Adherence Packaging  90 Days   Patient is due for next adherence delivery on: 03/22/2020.   Called patient and reviewed medications and coordinated delivery.   Patient indicated she saw rheumatologist recently and dose of methotrexate was decreased from 6 tablets once a week to 4 tablets once a week.   This delivery to include:  Levothyroxine 143mcg- 1 tablet in the morning   Sertraline 100mg   - 2 tablets in the morning  Estradiol 2mg - 1 tablet in the morning  Folic acid 1mg  - 1 tablet in the morning  Medroxyprogesterone 2.5mg - 1 tablet morning alternating with 2 tablets morning   Buspirone 15mg  - 0.5 tablet in the morning and 0.5 tablet at bedtime  Metformin 500mg   - 2 tablets in the morning and 1 tablet at bedtime   Lamotrigine 100mg  - 1 tablet in the morning and 1 tablet at bedtime   Topiramate 25mg - 1 tablet in the morning and 4 tablets at bedtime  Montelukast 10mg  - 1 tablet at bedtime  Tizanidine 4mg  - 3 tablets at bedtime  Methotrexate 2.5 mg - 4 tablets every  Friday bedtime  Allegra (OTC 180mg ) - 1 tablet at bedtime    Methadone 10mg  - PRN - VIAL   Fluticasone nasal spray   BD Integra 3ML, 25Gx 1.5"    No short fills needed.   Patient declined the following medications:  Due to taking as needed. Patient will requested when needed.  Hydromorphone 4mg  - PRN- VIAL  Vitamin B-12 inj - due 07/2020  Metoclopramide 10mg - PRN- VIAL  Astelin nasal spray- PRN  Ondansetron 4mg  PRN- VIAL NEED BRAND NAME.  Sucralfate 1g- PRN  Clindamycin lotion- PRN Ketoconazole 2% cream-  PRN Fluticasone 0.05% cream-  PRN  Triamcinolone 0.1% cream - PRN  Diclofenac 1% gel- PRN  Vitamin D3 2000 units- will call when needed; will work on supply before ordering from [facility].  Cranberry 4200mg   - will get on her own Omeprazole/ sodium bicarbonate - will get on her own.   Patient needs refills for: (will request refills from Dr. Ethlyn Gallery)  - fluticasone nasal spray - BD syringes, 3 ML, 25G x 1.5" (per patient request)   Confirmed delivery date of 03/22/2020, advised patient that pharmacy will contact them the morning of delivery.   Anson Crofts, PharmD Clinical Pharmacist [facility] [place] [phone]

## 2020-03-20 NOTE — Telephone Encounter (Signed)
Hi Dr. Ethlyn Gallery,   I am working on getting patient transitioned to adherence packaging with [facility] and have delivery coordinated for 03/24/2020. Patient is excited and looking to getting this set up.   I am requesting prescriptions for the following be sent to [facility]:    BD syringes- needles, 3ML, 25Gx 1.5 " (patient requested this size since the 25Gx1" she received were not long enough)   fluticasone nasal spray   Thank you!

## 2020-03-21 ENCOUNTER — Ambulatory Visit: Payer: Self-pay

## 2020-03-21 NOTE — Chronic Care Management (AMB) (Signed)
Received message from [place] at [employer]. Pharmacy inquired about medroxyprogesterone and including in packaging as:  1 tablet Monday 2 tablets Tuesday 1 tablet Wednesday 2 tablets Thursday 1 tablet Friday 2 tablets Saturday 1 tablet Sunday   Or delivering in vial.   Called patient to coordinate medroxyprogesterone. Patient opted to have included in packaging with above sig.   Coordinated with [facility]- medroxyprogesterone in packaging.

## 2020-03-22 ENCOUNTER — Telehealth: Payer: Self-pay | Admitting: Adult Health

## 2020-03-22 ENCOUNTER — Telehealth: Payer: Self-pay

## 2020-03-22 NOTE — Telephone Encounter (Signed)
Patient called for results of lab taken 02/07/2020 this nurse answered all questions patient was satisfied appreciative of return call

## 2020-03-22 NOTE — Telephone Encounter (Signed)
Rescheduled 8/24 appt to 8/25 per provider off day. Pt confirmed new appt date and time.

## 2020-04-16 ENCOUNTER — Telehealth: Payer: Self-pay

## 2020-04-17 ENCOUNTER — Telehealth: Payer: Self-pay | Admitting: *Deleted

## 2020-04-17 NOTE — Chronic Care Management (AMB) (Addendum)
Chronic Care Management Pharmacy Assistant   Name: Jo [name]  MRN: [id number] DOB: Nov 14, 1960  Reason for Encounter: Medication Review   PCP : Caren Macadam, MD  Allergies:   Allergies  Allergen Reactions   Bee Pollen Anaphylaxis   Diazepam     sucidal depression   Prednisone Nausea And Vomiting    Per pt can not take oral steroids    Amoxicillin     Severe diarrhea At high dose    Doxycycline     Pt does not remember reaction   Erythromycin     n & v   Morphine     n & v   Propoxyphene N-Acetaminophen     n & V   Sulfa Antibiotics     MIGRAINES   Tetracyclines & Related     N & v   Propoxyphene Other (See Comments)    Medications: Outpatient Encounter Medications as of 04/16/2020  Medication Sig Note   azelastine (ASTELIN) 0.1 % nasal spray Place 1 spray into both nostrils 2 (two) times daily. Use in each nostril as directed    busPIRone (BUSPAR) 15 MG tablet TAKE 1/2 TABLET TWICE A DAY    cholecalciferol (VITAMIN D) 1000 UNITS tablet Take 2,000 Units by mouth daily.    clindamycin (CLEOCIN T) 1 % lotion Apply 1 application topically 2 (two) times daily.     Cranberry 200 MG CAPS Take 200 mg by mouth 2 (two) times daily. 09/25/2015: Received from: [place]: Take by mouth.   cyanocobalamin (,VITAMIN B-12,) 1000 MCG/ML injection INJECT 1 ML INTO THE MUSCLE ONCE FOR 1 DOSE alternating every 2 weeks x 2 and then monthly (Patient taking differently: Inject 1,000 mcg into the muscle as directed. )    erythromycin with ethanol (EMGEL) 2 % gel Apply topically 2 (two) times daily. (Patient not taking: Reported on 01/08/2020)    estradiol (ESTRACE) 2 MG tablet TAKE 1 TABLET BY MOUTH EVERY DAY    fexofenadine-pseudoephedrine (ALLEGRA-D 24) 180-240 MG 24 hr tablet Take 1 tablet by mouth at bedtime.    fluticasone (CUTIVATE) 0.05 % cream Apply 1 application topically 2 (two) times daily as needed (rash).     fluticasone (FLONASE) 50 MCG/ACT nasal spray  Place 2 sprays into both nostrils daily.    folic acid (FOLVITE) 1 MG tablet Take 1 tablet (1 mg total) by mouth daily.    HYDROmorphone (DILAUDID) 4 MG tablet Take 4 mg by mouth every 4 (four) hours as needed for moderate pain or severe pain.     hydroxychloroquine (PLAQUENIL) 200 MG tablet Take 400 mg by mouth daily. (Patient not taking: Reported on 03/20/2020)    ketoconazole (NIZORAL) 2 % cream     lamoTRIgine (LAMICTAL) 100 MG tablet Take 1 tablet (100 mg total) by mouth 2 (two) times daily.    levothyroxine (SYNTHROID) 125 MCG tablet TAKE 1 TABLET BY MOUTH EVERY DAY    medroxyPROGESTERone (PROVERA) 2.5 MG tablet TAKE 1 TABLET ALTERNATING WITH 2 TABLETS DAILY    metFORMIN (GLUCOPHAGE) 500 MG tablet Take 2 tabs q am, 1 tab qhs.    methadone (DOLOPHINE) 10 MG tablet Take 10-20 mg by mouth in the morning, at noon, and at bedtime. Patient reports taking, 2 tablets in the morning, 2 tablets around noon, and 1 tablet at bedtime.    methotrexate (RHEUMATREX) 2.5 MG tablet Take 10 mg by mouth once a week.    Metoclopramide HCl 10 MG TBDP Take 1  tablet (10 mg total) by mouth 4 (four) times daily.    montelukast (SINGULAIR) 10 MG tablet TAKE 1 TABLET BY MOUTH EVERYDAY AT BEDTIME    nystatin cream (MYCOSTATIN) Apply 1 application topically 2 (two) times daily. (Patient not taking: Reported on 01/08/2020)    Omeprazole-Sodium Bicarbonate (ZEGERID) 20-1100 MG CAPS capsule Take 1 capsule by mouth daily.    ondansetron (ZOFRAN) 4 MG tablet Take 1 tablet (4 mg total) by mouth every 8 (eight) hours as needed for nausea.    sertraline (ZOLOFT) 100 MG tablet TAKE 2 TABLETS BY MOUTH EVERY DAY    sucralfate (CARAFATE) 1 g tablet Take 1 tablet (1 g total) by mouth 4 (four) times daily -  with meals and at bedtime. (Patient taking differently: Take 1 g by mouth 3 (three) times daily as needed (indigestion). )    Syringe/Needle, Disp, (SYRINGE 3CC/25GX1-1/2") 25G X 1-1/2" 3 ML MISC Inject 1cc (1026mcg) B12 q 2-4 weeks  as directed    tiZANidine (ZANAFLEX) 4 MG capsule Take 12 mg by mouth at bedtime as needed for muscle spasms.  09/25/2015: Received from: [place]: Take 4 mg by mouth.   topiramate (TOPAMAX) 25 MG tablet TAKE 1 TABLET 5 TIMES A DAY    triamcinolone cream (KENALOG) 0.1 %     Facility-Administered Encounter Medications as of 04/16/2020  Medication   cyanocobalamin ((VITAMIN B-12)) injection 1,000 mcg    Current Diagnosis: Patient Active Problem List   Diagnosis Date Noted   Iron deficiency 12/13/2019   Reflex sympathetic dystrophy of lower extremity 06/12/2019   Disorder of thyroid gland 06/12/2019   Falls 09/25/2015   Musculoskeletal neck pain 09/25/2015   Headache 09/25/2015   Memory changes 09/25/2015   Blurry vision 09/25/2015   Neck pain 09/25/2015   Polycystic ovaries 07/11/2013   Hypothyroidism 05/04/2013   Complex regional pain syndrome I, unspecified 03/06/2013   Extremity pain 35/00/9381   Helicobacter pylori infection 12/22/2012   REFLEX SYMPATHETIC DYSTROPHY 02/26/2009   History of colonic polyps 02/25/2009   OBESITY 01/14/2008   DEPRESSION/ANXIETY 01/14/2008   Asthma 01/14/2008   GERD 01/14/2008   Irritable colon 01/14/2008   Hx of neurosis 01/14/2008   Personal history of other mental and behavioral disorders 01/14/2008    Goals Addressed   None     Follow-Up:  Coordination of Enhanced Pharmacy Services   Reviewed chart for medication changes ahead of medication coordination call.  No OVs, Consults, or hospital visits since last care coordination call/Pharmacist visit.  No medication changes indicated.  BP Readings from Last 3 Encounters:  12/27/19 105/66  12/20/19 98/66  12/13/19 129/78    Lab Results  Component Value Date   HGBA1C 5.6 11/15/2019     Patient obtains medications through Adherence Packaging  90 Days   Last adherence delivery included:  Methadone 10mg ; two tabs a breakfast, one tab at lunch, one tab at  bedtime Levothyroxine 151mcg; one tab before breakfast Topiramate 25mg ; one the morning, four tabs at bedtime Sertraline 100mg ; two tabs before breakfast Buspirone 15mg ; one half tab before breakfast, one half tab at bedtime Metformin 500mg ; two tabs before breakfast, one tab at bedtime Tizanidine 4mg ; four tabs at bedtime Lamotrigine 100mg ; one tab before breakfast, one tab at bedtime Montelukast 10mg ; one tab at bedtime Medroxyprogesterone 2.5mg ; one tabs M,W,F two tabs T,TH,S before breakfast Estradiol 2mg ; one tab at breakfast Methotrexate Sodium 2.5mg ; four tabs on Fridays at bedtime Folic Acid 1mg ; one tab before breakfast Fluticasone nasal spray;  two sprays in both nostrils daily Allegra 180mg ; one tab every day Bd 3 MI Syringe 25gx1  Patient declined Ondansetron 4mg ; PRN (will call when needed) Triamcinolone 0.1% cream; apply to face twice daily one week on, one week off PRN Ketoconazole 2% cream; apply to feet twice daily PRN Allegra 180mg ; one tab every day (does not need) Zegerid 40mg ; one cap daily (does not need) Hydromorphone 4mg ; one tab three times a day PRN (will call when needed) Vitamin D3 2000; one tab once a day OTC (will call when needed)  Patient is due for next adherence delivery on: 06-24-2020. Called patient and reviewed medications and coordinated delivery.   Patient does not need refills at this time.  Confirmed delivery date of 06-24-2020 advised patient that pharmacy will contact them the morning of delivery.

## 2020-04-17 NOTE — Telephone Encounter (Signed)
Patient called wanting to get an appointment to discuss some issues with PCP. Patient stated that she has been having a hard time getting scheduled would like to get an appointment ASAP. Patient would like a call back.

## 2020-04-19 NOTE — Telephone Encounter (Signed)
Could do virtual at noon on weds or Friday next week if that works for her? (has to be virtual for those times because she is being added in and we don't have staff to room at that time, but if she feels she needs in person let me know and I'll look further out)

## 2020-04-19 NOTE — Telephone Encounter (Signed)
Left a message for the pt to return my call.  

## 2020-04-23 NOTE — Telephone Encounter (Signed)
Spoke with the pt and scheduled a virtual visit for 8/6 at 11:30am.

## 2020-04-26 ENCOUNTER — Telehealth (INDEPENDENT_AMBULATORY_CARE_PROVIDER_SITE_OTHER): Payer: PPO | Admitting: Family Medicine

## 2020-04-26 ENCOUNTER — Encounter: Payer: Self-pay | Admitting: Family Medicine

## 2020-04-26 VITALS — Wt 143.0 lb

## 2020-04-26 DIAGNOSIS — K219 Gastro-esophageal reflux disease without esophagitis: Secondary | ICD-10-CM | POA: Diagnosis not present

## 2020-04-26 DIAGNOSIS — M329 Systemic lupus erythematosus, unspecified: Secondary | ICD-10-CM

## 2020-04-26 DIAGNOSIS — J4520 Mild intermittent asthma, uncomplicated: Secondary | ICD-10-CM

## 2020-04-26 DIAGNOSIS — F341 Dysthymic disorder: Secondary | ICD-10-CM | POA: Diagnosis not present

## 2020-04-26 DIAGNOSIS — E039 Hypothyroidism, unspecified: Secondary | ICD-10-CM

## 2020-04-26 DIAGNOSIS — E611 Iron deficiency: Secondary | ICD-10-CM | POA: Diagnosis not present

## 2020-04-26 DIAGNOSIS — G9059 Complex regional pain syndrome I of other specified site: Secondary | ICD-10-CM | POA: Diagnosis not present

## 2020-04-26 DIAGNOSIS — Z8659 Personal history of other mental and behavioral disorders: Secondary | ICD-10-CM | POA: Diagnosis not present

## 2020-04-26 MED ORDER — TOPIRAMATE 25 MG PO TABS: ORAL_TABLET | ORAL | 1 refills | Status: DC

## 2020-04-26 MED ORDER — TIZANIDINE HCL 4 MG PO CAPS: 12.0000 mg | ORAL_CAPSULE | Freq: Every evening | ORAL | 1 refills | Status: DC | PRN

## 2020-04-26 NOTE — Assessment & Plan Note (Signed)
Food restriction when younger and then when going under divorce. Patient denies hx of purging.

## 2020-04-26 NOTE — Progress Notes (Addendum)
Virtual Visit via Video Note  I connected with Jo [name]  on 04/26/20 at 11:30 AM EDT by a video enabled telemedicine application and verified that I am speaking with the correct person using two identifiers.  Location patient: home Location provider:work or home office Persons participating in the virtual visit: patient, provider  I discussed the limitations of evaluation and management by telemedicine and the availability of in person appointments. The patient expressed understanding and agreed to proceed.   Jo [name] DOB: 1960-12-24 Encounter date: 04/26/2020  This is a 59 y.o. female who presents with Chief Complaint  Patient presents with  . Allergies    patient states allergy symptoms have worsened, using Allegra with no relief  . Referral    patient states her pain management retired Dr Sanjuan Dame, requests refills  . Lupus    History of present illness: Has been diagnosed with systemic lupus. Feels like this helps explain everything that is going on with her. Fatigue is worse than it has ever been. Was told that it will probably just get worse, not better. Done with day early and goes to bed. Still with extreme cold episodes. Not sure why this is occurring. Tried plaquenil but made sx worse, headaches worse. Couldn't take it. On methotrexate 4 pills/day, but couldn't increase to 6. Has follow up in a couple of weeks. Felt like it affected her mentally. Made her angry, frustrated.   Has a lot of sun sensitivity.   Allergies are worse. Feels like last few years they are killing her. Throat is so sensitive, choking up easier. Sinus congestion, cough worsening. Wondering if she should return to allergy testing/shots.   Pain doctor retired. Talked to her on his last day. He set her up with pain clinic, but she has been so sick with this flare, she has really been at home and in bed [name] of the time. She had appointment set up, but records hadn't been transferred so they had to post  pone her visit. She is awaiting them to get records now in order to schedule but they have told her likely not able to have a visit until end sept-early October.    Allergies  Allergen Reactions  . Bee Pollen Anaphylaxis  . Diazepam     sucidal depression  . Prednisone Nausea And Vomiting    Per pt can not take oral steroids   . Amoxicillin     Severe diarrhea At high dose   . Doxycycline     Pt does not remember reaction  . Erythromycin     n & v  . Morphine     n & v  . Propoxyphene N-Acetaminophen     n & V  . Sulfa Antibiotics     MIGRAINES  . Tetracyclines & Related     N & v  . Propoxyphene Other (See Comments)   Current Meds  Medication Sig  . azelastine (ASTELIN) 0.1 % nasal spray Place 1 spray into both nostrils 2 (two) times daily. Use in each nostril as directed  . busPIRone (BUSPAR) 15 MG tablet TAKE 1/2 TABLET TWICE A DAY  . cholecalciferol (VITAMIN D) 1000 UNITS tablet Take 2,000 Units by mouth daily.  . clindamycin (CLEOCIN T) 1 % lotion Apply 1 application topically 2 (two) times daily.   . Cranberry 200 MG CAPS Take 200 mg by mouth 2 (two) times daily.  . cyanocobalamin (,VITAMIN B-12,) 1000 MCG/ML injection INJECT 1 ML INTO THE MUSCLE ONCE FOR  1 DOSE alternating every 2 weeks x 2 and then monthly (Patient taking differently: Inject 1,000 mcg into the muscle as directed. )  . erythromycin with ethanol (EMGEL) 2 % gel Apply topically 2 (two) times daily.  Marland Kitchen estradiol (ESTRACE) 2 MG tablet TAKE 1 TABLET BY MOUTH EVERY DAY  . fexofenadine-pseudoephedrine (ALLEGRA-D 24) 180-240 MG 24 hr tablet Take 1 tablet by mouth at bedtime.  . fluticasone (CUTIVATE) 0.05 % cream Apply 1 application topically 2 (two) times daily as needed (rash).   . fluticasone (FLONASE) 50 MCG/ACT nasal spray Place 2 sprays into both nostrils daily.  . folic acid (FOLVITE) 1 MG tablet Take 1 tablet (1 mg total) by mouth daily.  Marland Kitchen lamoTRIgine (LAMICTAL) 100 MG tablet Take 1 tablet (100 mg  total) by mouth 2 (two) times daily.  Marland Kitchen levothyroxine (SYNTHROID) 125 MCG tablet TAKE 1 TABLET BY MOUTH EVERY DAY  . medroxyPROGESTERone (PROVERA) 2.5 MG tablet TAKE 1 TABLET ALTERNATING WITH 2 TABLETS DAILY  . metFORMIN (GLUCOPHAGE) 500 MG tablet Take 2 tabs q am, 1 tab qhs.  . methadone (DOLOPHINE) 10 MG tablet Take 10-20 mg by mouth in the morning, at noon, and at bedtime. Patient reports taking, 2 tablets in the morning, 2 tablets around noon, and 1 tablet at bedtime.  . Metoclopramide HCl 10 MG TBDP Take 1 tablet (10 mg total) by mouth 4 (four) times daily.  . montelukast (SINGULAIR) 10 MG tablet TAKE 1 TABLET BY MOUTH EVERYDAY AT BEDTIME  . nystatin cream (MYCOSTATIN) Apply 1 application topically 2 (two) times daily.  [place] Bicarbonate (ZEGERID) 20-1100 MG CAPS capsule Take 1 capsule by mouth daily.  . ondansetron (ZOFRAN) 4 MG tablet Take 1 tablet (4 mg total) by mouth every 8 (eight) hours as needed for nausea.  . sertraline (ZOLOFT) 100 MG tablet TAKE 2 TABLETS BY MOUTH EVERY DAY  . sucralfate (CARAFATE) 1 g tablet Take 1 tablet (1 g total) by mouth 4 (four) times daily -  with meals and at bedtime. (Patient taking differently: Take 1 g by mouth 3 (three) times daily as needed (indigestion). )  . Syringe/Needle, Disp, (SYRINGE 3CC/25GX1-1/2") 25G X 1-1/2" 3 ML MISC Inject 1cc (1021mcg) B12 q 2-4 weeks as directed  . tiZANidine (ZANAFLEX) 4 MG capsule Take 3 capsules (12 mg total) by mouth at bedtime as needed for muscle spasms.  Marland Kitchen topiramate (TOPAMAX) 25 MG tablet TAKE 1 TABLET 5 TIMES A DAY  . triamcinolone cream (KENALOG) 0.1 %   . [DISCONTINUED] HYDROmorphone (DILAUDID) 4 MG tablet Take 4 mg by mouth every 4 (four) hours as needed for moderate pain or severe pain.   . [DISCONTINUED] hydroxychloroquine (PLAQUENIL) 200 MG tablet Take 400 mg by mouth daily.   . [DISCONTINUED] ketoconazole (NIZORAL) 2 % cream   . [DISCONTINUED] methotrexate (RHEUMATREX) 2.5 MG tablet Take 10  mg by mouth once a week.  . [DISCONTINUED] tiZANidine (ZANAFLEX) 4 MG capsule Take 12 mg by mouth at bedtime as needed for muscle spasms.   . [DISCONTINUED] topiramate (TOPAMAX) 25 MG tablet TAKE 1 TABLET 5 TIMES A DAY   Current Facility-Administered Medications for the 04/26/20 encounter (Video Visit) with Caren Macadam, MD  Medication  . cyanocobalamin ((VITAMIN B-12)) injection 1,000 mcg    Review of Systems  Constitutional: Positive for activity change, appetite change and fatigue. Negative for chills and fever.  HENT: Positive for congestion.   Respiratory: Positive for cough. Negative for chest tightness, shortness of breath and wheezing.   Cardiovascular:  Negative for chest pain, palpitations and leg swelling.  Musculoskeletal: Positive for arthralgias, back pain and myalgias.  Neurological: Positive for weakness and headaches.    Objective:  Wt 143 lb (64.9 kg)   BMI 25.33 kg/m   Weight: 143 lb (64.9 kg)   BP Readings from Last 3 Encounters:  12/27/19 105/66  12/20/19 98/66  12/13/19 129/78   Wt Readings from Last 3 Encounters:  04/26/20 143 lb (64.9 kg)  12/13/19 159 lb 11.2 oz (72.4 kg)  12/11/19 163 lb (73.9 kg)    EXAM:  GENERAL: alert, oriented, appears chronically sick, but in no acute distress.  HEENT: atraumatic, conjunctiva clear, no obvious abnormalities on inspection of external nose and ears  NECK: normal movements of the head and neck  LUNGS: on inspection no signs of respiratory distress, breathing rate appears normal, no obvious gross SOB, gasping or wheezing  CV: no obvious cyanosis  MS: moves all visible extremities without noticeable abnormality  PSYCH/NEURO: pleasant and cooperative, no obvious depression or anxiety, speech and thought processing grossly intact   Assessment/Plan   1. Personal history of other mental and behavioral disorders She is currently having a very difficult time trying to manage her chronic conditions,  ongoing pain.  We discussed that titrating back on some of her medications may be helpful overall for her treatment and chronic conditions.  I would like to follow-up with her more regularly so we can continue to work on this.  Currently we will have her continue the BuSpar at 7.5 mg twice a day, Lamictal, 100 mg twice a day space (we discussed trying to decrease to half a tablet twice a day to see how she does with mood), Zoloft 200 mg once a day.   2. Mild intermittent asthma without complication Breathing is fairly stable right now continue current medications.  3. Gastroesophageal reflux disease, unspecified whether esophagitis present Zegerid, zofran prn, reglan prn, carafate prn. Follows with GI.  4. Hypothyroidism, unspecified type Continue with current Synthroid dose.  5. Complex regional pain syndrome type 1 affecting other site We discussed that with ongoing pain, it may be difficult to titrate back on medications, but this would be equivalent establishing care with a new pain provider.  We are going to check with her previous pain provider to make sure the records been sent to new pain provider, since she is going to run out of her methadone soon.  I have asked her to cut back on her Zanaflex to 8 mg nightly as needed.  We discussed that this medication can increase fatigue.  She has been taking methadone typically 2 tablets in the morning, 2 tablets in the afternoon, and 1 at bedtime, but feels that on Sunday she is able to manage with just 2 in the morning and 1 at night.  She feels she would be able to do this long-term.  She is not currently taking the Dilaudid.  6. Iron deficiency She has been unable to tolerate oral iron.  She did have improvement in blood work after infusion.  7. DEPRESSION/ANXIETY See above.  We will continue to try to wean off some of her medications, but I do think with chronic conditions is also important for [place] to make sure that mental health is well  managed.  8. Systemic lupus erythematosus, unspecified SLE type, unspecified organ involvement status [facility]) She is following with rheumatology regularly.  They are working to find medication that she can tolerate.    I discussed the assessment  and treatment plan with the patient. The patient was provided an opportunity to ask questions and all were answered. The patient agreed with the plan and demonstrated an understanding of the instructions.   The patient was advised to call back or seek an in-person evaluation if the symptoms worsen or if the condition fails to improve as anticipated.  I provided 45 minutes of  Total time for visit: 30 minutes spent in discussion of chronic conditions, medication management, ongoing symptoms, specialty care and 15 minutes spent in chart review and charting time. Return in about 2 months (around 06/26/2020) for Chronic condition visit.   Micheline Rough, MD

## 2020-04-29 ENCOUNTER — Telehealth: Payer: Self-pay | Admitting: *Deleted

## 2020-04-29 ENCOUNTER — Encounter: Payer: Self-pay | Admitting: Family Medicine

## 2020-04-29 DIAGNOSIS — M329 Systemic lupus erythematosus, unspecified: Secondary | ICD-10-CM | POA: Insufficient documentation

## 2020-04-29 NOTE — Telephone Encounter (Signed)
-----   Message from Caren Macadam, MD sent at 04/26/2020  1:04 PM EDT ----- Patient is having to transfer care from previous pain medicine doctor due to him retiring (Dr. Sanjuan Dame) she is transitioning per his request to [facility]. They are awaiting records (per patient) to see her. She will run out of methadone in about 2 weeks time. I am wondering status of them getting records and ability to schedule her. I do not write for methadone but do not want her to run out. Their number:[phone].   If they do not have records yet, Dr. Sharyon Cable number is: [phone] (per patient) and wondering if he could expedite last note or perhaps give her refill until she can see them?

## 2020-04-29 NOTE — Telephone Encounter (Signed)
I called Dr Jaci Lazier office, spoke with the doctor himself and informed him of the message below.  Dr Sanjuan Dame stated he cannot write for a refill as he retired back in May and Dr Ethlyn Gallery may want to give a refil, refer to a pain doctorl or send the pt to the ER to avoid withdrawals and he will check to see if his office has sent records to the new pain management office.  Message sent to PCP.

## 2020-05-01 NOTE — Telephone Encounter (Signed)
Please have her let me know when her appointment with the new pain doctor is. I will work with her to get her the medications she takes if she will run out before that visit. Please have her make sure that they do have her records from Dr. Sanjuan Dame.

## 2020-05-01 NOTE — Telephone Encounter (Signed)
Patient called back and was informed of the message below.   

## 2020-05-01 NOTE — Telephone Encounter (Signed)
Left a message for the pt to return my call.  

## 2020-05-03 ENCOUNTER — Other Ambulatory Visit: Payer: Self-pay

## 2020-05-03 DIAGNOSIS — E611 Iron deficiency: Secondary | ICD-10-CM

## 2020-05-03 NOTE — Telephone Encounter (Signed)
Pt stated she would like the referral sent to [facility] management Dr. Nicholaus Bloom.

## 2020-05-06 ENCOUNTER — Other Ambulatory Visit: Payer: Self-pay | Admitting: Family Medicine

## 2020-05-06 DIAGNOSIS — G9059 Complex regional pain syndrome I of other specified site: Secondary | ICD-10-CM

## 2020-05-06 NOTE — Telephone Encounter (Signed)
Spoke with the pt and informed her of the message below.   

## 2020-05-06 NOTE — Telephone Encounter (Signed)
I placed referral.

## 2020-05-06 NOTE — Telephone Encounter (Signed)
Spoke with the pt and informed her of the message below.  Patient agreed to call her insurance company and call back with information for a pain management physician that is covered by her insurance.

## 2020-05-06 NOTE — Telephone Encounter (Signed)
He comes up as a non-preferred provider ([facility] does in general). I'm not sure if she has looked into this with insurance. I am fine with any provider she would like, but wanted to bring this to her attention before placing referral. She is in a time crunch now for finding provider/getting records transferred because she will run out of meds and I am not qualified to prescribe her current medications. So please help refer wherever she would like to go, but she also needs to make sure records get transferred there from previous doc as they will not see her without these.

## 2020-05-06 NOTE — Telephone Encounter (Signed)
Pt called her insurance and they confirmed that he is in network with her insurance.

## 2020-05-07 ENCOUNTER — Other Ambulatory Visit: Payer: Self-pay

## 2020-05-07 ENCOUNTER — Inpatient Hospital Stay: Payer: PPO | Attending: Adult Health

## 2020-05-07 DIAGNOSIS — M329 Systemic lupus erythematosus, unspecified: Secondary | ICD-10-CM | POA: Insufficient documentation

## 2020-05-07 DIAGNOSIS — R5383 Other fatigue: Secondary | ICD-10-CM | POA: Diagnosis not present

## 2020-05-07 DIAGNOSIS — D649 Anemia, unspecified: Secondary | ICD-10-CM | POA: Insufficient documentation

## 2020-05-07 DIAGNOSIS — R5382 Chronic fatigue, unspecified: Secondary | ICD-10-CM | POA: Insufficient documentation

## 2020-05-07 DIAGNOSIS — E538 Deficiency of other specified B group vitamins: Secondary | ICD-10-CM | POA: Insufficient documentation

## 2020-05-07 DIAGNOSIS — E611 Iron deficiency: Secondary | ICD-10-CM

## 2020-05-07 LAB — CMP (CANCER CENTER ONLY)
ALT: 9 U/L (ref 0–44)
AST: 16 U/L (ref 15–41)
Albumin: 4 g/dL (ref 3.5–5.0)
Alkaline Phosphatase: 45 U/L (ref 38–126)
Anion gap: 5 (ref 5–15)
BUN: 10 mg/dL (ref 6–20)
CO2: 24 mmol/L (ref 22–32)
Calcium: 9.1 mg/dL (ref 8.9–10.3)
Chloride: 109 mmol/L (ref 98–111)
Creatinine: 0.86 mg/dL (ref 0.44–1.00)
GFR, Est AFR Am: >60 mL/min (ref >60)
GFR, Estimated: >60 mL/min (ref >60)
Glucose, Bld: 87 mg/dL (ref 70–99)
Potassium: 4.2 mmol/L (ref 3.5–5.1)
Sodium: 138 mmol/L (ref 135–145)
Total Bilirubin: 0.2 mg/dL — ABNORMAL LOW (ref 0.3–1.2)
Total Protein: 6.7 g/dL (ref 6.5–8.1)

## 2020-05-07 LAB — CBC WITH DIFFERENTIAL (CANCER CENTER ONLY)
Abs Immature Granulocytes: 0.02 10*3/uL (ref 0.00–0.07)
Basophils Absolute: 0.1 10*3/uL (ref 0.0–0.1)
Basophils Relative: 1 %
Eosinophils Absolute: 0.2 10*3/uL (ref 0.0–0.5)
Eosinophils Relative: 3 %
HCT: 38.5 % (ref 36.0–46.0)
Hemoglobin: 12.3 g/dL (ref 12.0–15.0)
Immature Granulocytes: 0 %
Lymphocytes Relative: 35 %
Lymphs Abs: 2.1 10*3/uL (ref 0.7–4.0)
MCH: 30.8 pg (ref 26.0–34.0)
MCHC: 31.9 g/dL (ref 30.0–36.0)
MCV: 96.5 fL (ref 80.0–100.0)
Monocytes Absolute: 0.4 10*3/uL (ref 0.1–1.0)
Monocytes Relative: 6 %
Neutro Abs: 3.3 10*3/uL (ref 1.7–7.7)
Neutrophils Relative %: 55 %
Platelet Count: 238 10*3/uL (ref 150–400)
RBC: 3.99 MIL/uL (ref 3.87–5.11)
RDW: 14 % (ref 11.5–15.5)
WBC Count: 5.9 10*3/uL (ref 4.0–10.5)
nRBC: 0 % (ref 0.0–0.2)

## 2020-05-14 ENCOUNTER — Ambulatory Visit: Payer: PPO | Admitting: Adult Health

## 2020-05-15 ENCOUNTER — Encounter: Payer: Self-pay | Admitting: Adult Health

## 2020-05-15 ENCOUNTER — Other Ambulatory Visit: Payer: Self-pay

## 2020-05-15 ENCOUNTER — Other Ambulatory Visit: Payer: Self-pay | Admitting: Family Medicine

## 2020-05-15 ENCOUNTER — Telehealth: Payer: Self-pay | Admitting: Adult Health

## 2020-05-15 ENCOUNTER — Inpatient Hospital Stay: Payer: PPO

## 2020-05-15 ENCOUNTER — Inpatient Hospital Stay: Payer: PPO | Admitting: Adult Health

## 2020-05-15 ENCOUNTER — Telehealth: Payer: Self-pay | Admitting: Family Medicine

## 2020-05-15 VITALS — BP 110/84 | HR 67 | Temp 97.6°F | Resp 18 | Ht 63.0 in | Wt 159.4 lb

## 2020-05-15 DIAGNOSIS — E538 Deficiency of other specified B group vitamins: Secondary | ICD-10-CM | POA: Diagnosis not present

## 2020-05-15 DIAGNOSIS — E611 Iron deficiency: Secondary | ICD-10-CM

## 2020-05-15 LAB — CBC WITH DIFFERENTIAL (CANCER CENTER ONLY)
Abs Immature Granulocytes: 0.01 K/uL (ref 0.00–0.07)
Basophils Absolute: 0.1 K/uL (ref 0.0–0.1)
Basophils Relative: 1 %
Eosinophils Absolute: 0.2 K/uL (ref 0.0–0.5)
Eosinophils Relative: 3 %
HCT: 36.8 % (ref 36.0–46.0)
Hemoglobin: 12 g/dL (ref 12.0–15.0)
Immature Granulocytes: 0 %
Lymphocytes Relative: 40 %
Lymphs Abs: 2.1 K/uL (ref 0.7–4.0)
MCH: 31.6 pg (ref 26.0–34.0)
MCHC: 32.6 g/dL (ref 30.0–36.0)
MCV: 96.8 fL (ref 80.0–100.0)
Monocytes Absolute: 0.3 K/uL (ref 0.1–1.0)
Monocytes Relative: 6 %
Neutro Abs: 2.6 K/uL (ref 1.7–7.7)
Neutrophils Relative %: 50 %
Platelet Count: 237 K/uL (ref 150–400)
RBC: 3.8 MIL/uL — ABNORMAL LOW (ref 3.87–5.11)
RDW: 13.8 % (ref 11.5–15.5)
WBC Count: 5.3 K/uL (ref 4.0–10.5)
nRBC: 0 % (ref 0.0–0.2)

## 2020-05-15 LAB — CMP (CANCER CENTER ONLY)
ALT: <6 U/L (ref 0–44)
AST: 17 U/L (ref 15–41)
Albumin: 4.2 g/dL (ref 3.5–5.0)
Alkaline Phosphatase: 51 U/L (ref 38–126)
Anion gap: 6 (ref 5–15)
BUN: 10 mg/dL (ref 6–20)
CO2: 24 mmol/L (ref 22–32)
Calcium: 9.1 mg/dL (ref 8.9–10.3)
Chloride: 109 mmol/L (ref 98–111)
Creatinine: 0.87 mg/dL (ref 0.44–1.00)
GFR, Est AFR Am: >60 mL/min (ref >60)
GFR, Estimated: >60 mL/min (ref >60)
Glucose, Bld: 77 mg/dL (ref 70–99)
Potassium: 4.1 mmol/L (ref 3.5–5.1)
Sodium: 139 mmol/L (ref 135–145)
Total Bilirubin: 0.3 mg/dL (ref 0.3–1.2)
Total Protein: 6.9 g/dL (ref 6.5–8.1)

## 2020-05-15 LAB — FOLATE: Folate: 91.9 ng/mL (ref >5.9)

## 2020-05-15 MED ORDER — METHADONE HCL 10 MG PO TABS: 10.0000 mg | ORAL_TABLET | Freq: Three times a day (TID) | ORAL | 0 refills | Status: DC

## 2020-05-15 NOTE — Progress Notes (Signed)
[place]  [phone] [phone]     ID: [name] DOB: 04/02/1961  MR#: [id number]  ZDG#:[id number]  Patient Care Team: Caren Macadam, MD as PCP - General (Family Medicine) Leone Payor, MD as Referring Physician (Anesthesiology) Gatha Mayer, MD as Consulting Physician (Gastroenterology) Magrinat, Virgie Dad, MD as Consulting Physician (Oncology) [name], MD as Consulting Physician (Rheumatology) Jacelyn Pi, MD as Referring Physician (Endocrinology) Earnie Larsson, [facility] as Pharmacist (Pharmacist) Scot Dock, NP OTHER MD:  CHIEF COMPLAINT: Iron deficiency  CURRENT TREATMENT:  IV iron when needed   HISTORY OF CURRENT ILLNESS:  Jo [name] is here today for evaluation and management for iron deficiency at the request of Dr. Ethlyn Gallery.  Jo [name] has had chronic fatigue, however notes that her fatigue and weakness have worsened over the past year.  Her iron studies have been as follows:  Results for Jo [name], PFEIFLE (MRN [id number]) as of 12/13/2019 12:01  Ref. Range 02/21/2007 15:32 08/01/2012 16:05 09/27/2018 13:03 11/15/2019 09:49  Iron Latest Ref Range: 42 - 145 ug/dL 80   61  Saturation Ratios Latest Ref Range: 20.0 - 50.0 % 19.0 (L)   13.2 (L)  Ferritin Latest Ref Range: 10.0 - 291.0 ng/mL 15.0 5.6 (L)  8.1 (L)  Transferrin Latest Ref Range: 212.0 - 360.0 mg/dL 301.1   329.0  Folate Latest Ref Range: >5.9 ng/mL 19.0  17.6 6.0   Markeria cannot tolerate oral iron, and she was referred here for evaluation of iron deficiency and management with IV iron.  [name]'s CBC has maintained a stable and normal hemoglobin without evidence of microcytosis:  Results for Jo [name], ALLES (MRN [id number]) as of 12/13/2019 12:01  Ref. Range 11/15/2019 09:49 12/11/2019 15:59  WBC Latest Ref Range: 4.0 - 10.5 K/uL 4.9 6.5  RBC Latest Ref Range: 3.87 - 5.11 MIL/uL 4.16 4.39  Hemoglobin Latest Ref Range: 12.0 - 15.0 g/dL 12.2 12.9  HCT Latest Ref Range: 36.0 -  46.0 % 37.4 40.4  MCV Latest Ref Range: 80.0 - 100.0 fL 89.8 92.0  MCH Latest Ref Range: 26.0 - 34.0 pg  29.4  MCHC Latest Ref Range: 30.0 - 36.0 g/dL 32.6 31.9  RDW Latest Ref Range: 11.5 - 15.5 % 13.4 13.2  Platelets Latest Ref Range: 150 - 400 K/uL 248.0 273   Jo [name] is also in the middle of undergoing evaluation by rheumatology for lupus.  She has a worsening facial rash and also positive ANA.    The patient's subsequent history is as detailed below.  INTERVAL HISTORY: Jo [name] is here today for evaluation and management of her iron deficiency and inability to tolerate oral iron.  She has folate deficiency and is taking folic acid daily.  Her most recent iron studies were normal, today's are pending.  REVIEW OF SYSTEMS: [name] notes that she has been diagnosed with lupus.  She notes that she has been receiving treatment with methotrexate, however she remains fatigued.  She sees Dr. Kathlene November for this regularly.  She denies any new issues such as fever, chills, chest pain, palpitations, cough, bowel/bladder changes, menstrual cycles, hematochezia, melena, or any other concerns.  A detailed ROS was otherwise non contributory.    PAST MEDICAL HISTORY: Past Medical History:  Diagnosis Date  . Allergy   . Anemia   . Anxiety   . Anxiety and depression   . Arthritis   . Asthma   . Chronic headaches   . Colon polyp   . COPD (chronic obstructive pulmonary disease) (  [place])    no o2  . Depression   . Fundic gland polyps of stomach, benign   . Gastritis   . GERD (gastroesophageal reflux disease)   . H. pylori infection 2013  . H/O gastric ulcer 1985  . History of anorexia nervosa   . History of bulimia   . History of pneumonia   . Hypothyroidism   . Irritable bowel syndrome (IBS)   . Obesity   . Personal history of other mental and behavioral disorders 01/14/2008   Qualifier: Diagnosis of  By: Carlean Purl MD, [name]   Overview:  Overview:  Annotation: BULIMIA Qualifier: Diagnosis of  By: Bertram Gala   . REFLEX SYMPATHETIC DYSTROPHY 02/26/2009   Qualifier: Diagnosis of  By: Carlean Purl MD, Tonna Boehringer E   . RSD (reflex sympathetic dystrophy)   . RSD (reflex sympathetic dystrophy)   . Ulcer    gastric, duodenal ulcer    PAST SURGICAL HISTORY: Past Surgical History:  Procedure Laterality Date  . CERVICAL SPINE SURGERY  1990  . CERVIX SURGERY    . CHOLECYSTECTOMY    . COLONOSCOPY    . KNEE ARTHROSCOPY  04/21/2012   right  . LAPAROSCOPIC NISSEN FUNDOPLICATION  68/37/2902   Dr. Johnathan Hausen  . lumbar spondylosis injected  2013   Dr. Mina Marble  . NASAL SINUS SURGERY    . THORACOSCOPY W/ THORACIC SYMPATHECTOMY     for RSD  . TONSILLECTOMY    . UPPER GASTROINTESTINAL ENDOSCOPY      FAMILY HISTORY Family History  Problem Relation Age of Onset  . Colon polyps Mother        part of intestines removed  . Raynaud syndrome Mother   . Irritable bowel syndrome Mother   . Other Mother        cold agluten  . Stroke Mother   . Colon polyps Brother   . Kidney Stones Brother   . Breast cancer Maternal Aunt   . Lung cancer Maternal Grandfather   . Colon cancer Neg Hx   . Esophageal cancer Neg Hx   . Rectal cancer Neg Hx   . Stomach cancer Neg Hx   . Migraines Neg Hx   . Seizures Neg Hx     GYNECOLOGIC HISTORY:  No LMP recorded. Patient is postmenopausal. Menarche: 59 years old [place] P 0 LMP 3/23 Contraceptivenone HRT no  Hysterectomy? no Salpingo-oophorectomy?no    SOCIAL HISTORY:  Jo [name] is divorced and lives alone in [place], [place].  She was previously a flight attendant, but is unable to work with her current physical issues. She does not have any children and her sister and brother in law are her emergency contact, and live nearby.       ADVANCED DIRECTIVES:    HEALTH MAINTENANCE: Social History   Tobacco Use  . Smoking status: Never Smoker  . Smokeless tobacco: Never Used  Substance Use Topics  . Alcohol use: No  . Drug use: No      Colonoscopy:2013  PAP:   Bone density:   Allergies  Allergen Reactions  . Bee Pollen Anaphylaxis  . Diazepam     sucidal depression  . Prednisone Nausea And Vomiting    Per pt can not take oral steroids   . Amoxicillin     Severe diarrhea At high dose   . Doxycycline     Pt does not remember reaction  . Erythromycin     n & v  . Morphine  n & v  . Propoxyphene N-Acetaminophen     n & V  . Sulfa Antibiotics     MIGRAINES  . Tetracyclines & Related     N & v  . Propoxyphene Other (See Comments)    Current Outpatient Medications  Medication Sig Dispense Refill  . azelastine (ASTELIN) 0.1 % nasal spray Place 1 spray into both nostrils 2 (two) times daily. Use in each nostril as directed 30 mL 2  . busPIRone (BUSPAR) 15 MG tablet TAKE 1/2 TABLET TWICE A DAY 90 tablet 1  . cholecalciferol (VITAMIN D) 1000 UNITS tablet Take 2,000 Units by mouth daily.    . clindamycin (CLEOCIN T) 1 % lotion Apply 1 application topically 2 (two) times daily.     . Cranberry 200 MG CAPS Take 200 mg by mouth 2 (two) times daily.    . cyanocobalamin (,VITAMIN B-12,) 1000 MCG/ML injection INJECT 1 ML INTO THE MUSCLE ONCE FOR 1 DOSE alternating every 2 weeks x 2 and then monthly (Patient taking differently: Inject 1,000 mcg into the muscle as directed. ) 10 mL 1  . erythromycin with ethanol (EMGEL) 2 % gel Apply topically 2 (two) times daily. 30 g 1  . estradiol (ESTRACE) 2 MG tablet TAKE 1 TABLET BY MOUTH EVERY DAY 90 tablet 1  . fexofenadine-pseudoephedrine (ALLEGRA-D 24) 180-240 MG 24 hr tablet Take 1 tablet by mouth at bedtime.    . fluticasone (CUTIVATE) 0.05 % cream Apply 1 application topically 2 (two) times daily as needed (rash).     . fluticasone (FLONASE) 50 MCG/ACT nasal spray Place 2 sprays into both nostrils daily. 38.2 mL 5  . folic acid (FOLVITE) 1 MG tablet Take 1 tablet (1 mg total) by mouth daily. 30 tablet 6  . lamoTRIgine (LAMICTAL) 100 MG tablet Take 1 tablet (100 mg  total) by mouth 2 (two) times daily. (Patient taking differently: Take 100 mg by mouth 2 (two) times daily. Take 1/2 tab PO in the morning and 1 tablet at HS) 180 tablet 3  . levothyroxine (SYNTHROID) 125 MCG tablet TAKE 1 TABLET BY MOUTH EVERY DAY 90 tablet 1  . medroxyPROGESTERone (PROVERA) 2.5 MG tablet TAKE 1 TABLET ALTERNATING WITH 2 TABLETS DAILY 135 tablet 1  . metFORMIN (GLUCOPHAGE) 500 MG tablet Take 2 tabs q am, 1 tab qhs. 270 tablet 3  . methadone (DOLOPHINE) 10 MG tablet Take 1-2 tablets (10-20 mg total) by mouth in the morning, at noon, and at bedtime. Patient reports taking, 2 tablets in the morning, 2 tablets around noon, and 1 tablet at bedtime. 120 tablet 0  . Metoclopramide HCl 10 MG TBDP Take 1 tablet (10 mg total) by mouth 4 (four) times daily. 360 tablet 3  . montelukast (SINGULAIR) 10 MG tablet TAKE 1 TABLET BY MOUTH EVERYDAY AT BEDTIME 90 tablet 3  . nystatin cream (MYCOSTATIN) Apply 1 application topically 2 (two) times daily. 30 g 1  . Omeprazole-Sodium Bicarbonate (ZEGERID) 20-1100 MG CAPS capsule Take 1 capsule by mouth daily. 90 capsule 1  . ondansetron (ZOFRAN) 4 MG tablet Take 1 tablet (4 mg total) by mouth every 8 (eight) hours as needed for nausea. 30 tablet 3  . sertraline (ZOLOFT) 100 MG tablet TAKE 2 TABLETS BY MOUTH EVERY DAY 180 tablet 1  . sucralfate (CARAFATE) 1 g tablet Take 1 tablet (1 g total) by mouth 4 (four) times daily -  with meals and at bedtime. (Patient taking differently: Take 1 g by mouth 3 (three) times daily  as needed (indigestion). )    . Syringe/Needle, Disp, (SYRINGE 3CC/25GX1-1/2") 25G X 1-1/2" 3 ML MISC Inject 1cc (1085mcg) B12 q 2-4 weeks as directed 100 each 3  . tiZANidine (ZANAFLEX) 4 MG capsule Take 3 capsules (12 mg total) by mouth at bedtime as needed for muscle spasms. 90 capsule 1  . topiramate (TOPAMAX) 25 MG tablet TAKE 1 TABLET 5 TIMES A DAY 150 tablet 1  . triamcinolone cream (KENALOG) 0.1 %      Current Facility-Administered  Medications  Medication Dose Route Frequency Provider Last Rate Last Admin  . cyanocobalamin ((VITAMIN B-12)) injection 1,000 mcg  1,000 mcg Intramuscular Q14 Days Koberlein, Junell C, MD   1,000 mcg at 05/25/18 1244    OBJECTIVE:  Vitals:   05/15/20 1440  BP: 110/84  Pulse: 67  Resp: 18  Temp: 97.6 F (36.4 C)  SpO2: 99%     Body mass index is 28.24 kg/m.   Wt Readings from Last 3 Encounters:  05/15/20 159 lb 6.4 oz (72.3 kg)  04/26/20 143 lb (64.9 kg)  12/13/19 159 lb 11.2 oz (72.4 kg)      ECOG FS:1 - Symptomatic but completely ambulatory GENERAL: Patient is a tired appearing female in no acute distress HEENT:  Sclerae anicteric.  Mask in place. Neck is supple.  NODES:  No cervical, supraclavicular, or axillary lymphadenopathy palpated.  BREAST EXAM:  Deferred. LUNGS:  Clear to auscultation bilaterally.  No wheezes or rhonchi. HEART:  Regular rate and rhythm. No murmur appreciated. ABDOMEN:  Soft, nontender.  Positive, normoactive bowel sounds. No organomegaly palpated. MSK:  No focal spinal tenderness to palpation.  EXTREMITIES:  No peripheral edema.   SKIN:  Intact, no rashes or lesions noted. NEURO:  Nonfocal. Well oriented.  Appropriate affect.     LAB RESULTS:  CMP     Component Value Date/Time   NA 138 05/07/2020 1440   K 4.2 05/07/2020 1440   CL 109 05/07/2020 1440   CO2 24 05/07/2020 1440   GLUCOSE 87 05/07/2020 1440   BUN 10 05/07/2020 1440   CREATININE 0.86 05/07/2020 1440   CALCIUM 9.1 05/07/2020 1440   PROT 6.7 05/07/2020 1440   ALBUMIN 4.0 05/07/2020 1440   AST 16 05/07/2020 1440   ALT 9 05/07/2020 1440   ALKPHOS 45 05/07/2020 1440   BILITOT 0.2 (L) 05/07/2020 1440   GFRNONAA >60 05/07/2020 1440   GFRAA >60 05/07/2020 1440    No results found for: TOTALPROTELP, ALBUMINELP, A1GS, A2GS, BETS, BETA2SER, GAMS, MSPIKE, SPEI  No results found for: Nils Pyle, [facility]  Lab Results  Component Value Date   WBC 5.3 05/15/2020    NEUTROABS 2.6 05/15/2020   HGB 12.0 05/15/2020   HCT 36.8 05/15/2020   MCV 96.8 05/15/2020   PLT 237 05/15/2020      Chemistry      Component Value Date/Time   NA 138 05/07/2020 1440   K 4.2 05/07/2020 1440   CL 109 05/07/2020 1440   CO2 24 05/07/2020 1440   BUN 10 05/07/2020 1440   CREATININE 0.86 05/07/2020 1440      Component Value Date/Time   CALCIUM 9.1 05/07/2020 1440   ALKPHOS 45 05/07/2020 1440   AST 16 05/07/2020 1440   ALT 9 05/07/2020 1440   BILITOT 0.2 (L) 05/07/2020 1440       No results found for: LABCA2  No components found for: DXAJOI786  No results for input(s): INR in the last 168 hours.  No results found for:  LABCA2  No results found for: LGX211  No results found for: HER740  No results found for: CXK481  No results found for: CA2729  No components found for: HGQUANT  No results found for: CEA1 / No results found for: CEA1   No results found for: AFPTUMOR  No results found for: CHROMOGRNA  No results found for: PSA1  Appointment on 05/15/2020  Component Date Value Ref Range Status  . WBC Count 05/15/2020 5.3  4.0 - 10.5 K/uL Final  . RBC 05/15/2020 3.80* 3.87 - 5.11 MIL/uL Final  . Hemoglobin 05/15/2020 12.0  12.0 - 15.0 g/dL Final  . HCT 05/15/2020 36.8  36 - 46 % Final  . MCV 05/15/2020 96.8  80.0 - 100.0 fL Final  . MCH 05/15/2020 31.6  26.0 - 34.0 pg Final  . MCHC 05/15/2020 32.6  30.0 - 36.0 g/dL Final  . RDW 05/15/2020 13.8  11.5 - 15.5 % Final  . Platelet Count 05/15/2020 237  150 - 400 K/uL Final  . nRBC 05/15/2020 0.0  0.0 - 0.2 % Final  . Neutrophils Relative % 05/15/2020 50  % Final  . Neutro Abs 05/15/2020 2.6  1.7 - 7.7 K/uL Final  . Lymphocytes Relative 05/15/2020 40  % Final  . Lymphs Abs 05/15/2020 2.1  0.7 - 4.0 K/uL Final  . Monocytes Relative 05/15/2020 6  % Final  . Monocytes Absolute 05/15/2020 0.3  0 - 1 K/uL Final  . Eosinophils Relative 05/15/2020 3  % Final  . Eosinophils Absolute 05/15/2020 0.2  0  - 0 K/uL Final  . Basophils Relative 05/15/2020 1  % Final  . Basophils Absolute 05/15/2020 0.1  0 - 0 K/uL Final  . Immature Granulocytes 05/15/2020 0  % Final  . Abs Immature Granulocytes 05/15/2020 0.01  0.00 - 0.07 K/uL Final   Performed at [facility], [address] [place], [place] [address]    (this displays the last labs from the last 3 days)  No results found for: TOTALPROTELP, ALBUMINELP, A1GS, A2GS, BETS, BETA2SER, GAMS, MSPIKE, SPEI (this displays SPEP labs)  No results found for: KPAFRELGTCHN, LAMBDASER, KAPLAMBRATIO (kappa/lambda light chains)  No results found for: HGBA, HGBA2QUANT, HGBFQUANT, HGBSQUAN (Hemoglobinopathy evaluation)   No results found for: LDH  Lab Results  Component Value Date   IRON 82 12/13/2019   TIBC 495 (H) 12/13/2019   IRONPCTSAT 16 (L) 12/13/2019   (Iron and TIBC)  Lab Results  Component Value Date   FERRITIN 239 02/07/2020    Urinalysis    Component Value Date/Time   COLORURINE YELLOW 12/11/2019 1559   APPEARANCEUR HAZY (A) 12/11/2019 1559   LABSPEC 1.027 12/11/2019 1559   PHURINE 5.0 12/11/2019 1559   GLUCOSEU NEGATIVE 12/11/2019 1559   HGBUR MODERATE (A) 12/11/2019 1559   BILIRUBINUR NEGATIVE 12/11/2019 1559   KETONESUR 80 (A) 12/11/2019 1559   PROTEINUR 30 (A) 12/11/2019 1559   UROBILINOGEN 0.2 12/16/2010 1802   NITRITE NEGATIVE 12/11/2019 1559   LEUKOCYTESUR SMALL (A) 12/11/2019 1559     STUDIES: No results found.  ASSESSMENT: 59 y.o.  woman who is here today for management of her iron deficiency.   1. Longstanding chronic fatigue, worsened since 2020  (a) positive ANA, CRP, ESR noted on 11/29/2019  (b) under care of Dr. Isaias Sakai  2.  Iron deficiency dating back to 2013  (a) unable to tolerate oral iron  (b) most recent ferritin on 2/24 was 8  (c) to receive IV iron on 3/31  3. Folate deficiency  (a) 11/15/2019 was noted to be 6  (b) has been recommended oral  supplementation, she has not started this  PLAN:  Canaan is here today for follow up of her labs.  I let her know that we will call her with her iron studies.  She understands this.  I suspect a large part of her fatigue is related to her autoimmune condition for which she will continue to f/u with Dr. Kathlene November.  I am happy that her cbc and cmet are normal however.   Safiya has no symptoms of bleeding.  We will repeat her iron studies every 3 months and then see her back in a year.  She will get IV iron if needed and will continue to take her folate supplementation regularly.    Lakisha is in agreement with this plan.  She knows to call if she needs [place] between now and her next visit with [place].  Total encounter time: 20 minutes*  Wilber Bihari, NP 05/15/20 3:57 PM Medical Oncology and Hematology [facility] [place], [place] [address] Tel. [phone]    Fax. [phone]   *Total Encounter Time as defined by the Centers for Medicare and Medicaid Services includes, in addition to the face-to-face time of a patient visit (documented in the note above) non-face-to-face time: obtaining and reviewing outside history, ordering and reviewing medications, tests or procedures, care coordination (communications with other health care professionals or caregivers) and documentation in the medical record.

## 2020-05-15 NOTE — Telephone Encounter (Signed)
pt requesting a refill on methadone (DOLOPHINE) 10 MG tablet [facility] - [place], [place] - [address]  Phone:  [phone] Fax:  [phone]  Until to get into the pain clinic

## 2020-05-15 NOTE — Telephone Encounter (Signed)
Spoke with the Jo [name] and informed her of the message below.  Patient stated she does not have an appt yet as [name] is checking on records and an appt for her.  Message sent to PCP.

## 2020-05-15 NOTE — Telephone Encounter (Signed)
I did send refill. Please find out when visit with pain specialist is? I sent in same amount that pain provider was giving her (120 tablets), but I would encourage her to limit use as she was before with taking the lowest dose possible to manage pain.

## 2020-05-15 NOTE — Telephone Encounter (Signed)
Scheduled per los. Gave avs and calendar  

## 2020-05-15 NOTE — Telephone Encounter (Signed)
Noted  

## 2020-05-16 LAB — IRON AND TIBC
Iron: 130 ug/dL (ref 41–142)
Saturation Ratios: 40 % (ref 21–57)
TIBC: 325 ug/dL (ref 236–444)
UIBC: 196 ug/dL (ref 120–384)

## 2020-05-16 LAB — FERRITIN: Ferritin: 205 ng/mL (ref 11–307)

## 2020-05-20 DIAGNOSIS — M329 Systemic lupus erythematosus, unspecified: Secondary | ICD-10-CM | POA: Diagnosis not present

## 2020-05-20 DIAGNOSIS — I7300 Raynaud's syndrome without gangrene: Secondary | ICD-10-CM | POA: Diagnosis not present

## 2020-05-20 DIAGNOSIS — R5383 Other fatigue: Secondary | ICD-10-CM | POA: Diagnosis not present

## 2020-05-20 DIAGNOSIS — K219 Gastro-esophageal reflux disease without esophagitis: Secondary | ICD-10-CM | POA: Diagnosis not present

## 2020-05-20 DIAGNOSIS — G9050 Complex regional pain syndrome I, unspecified: Secondary | ICD-10-CM | POA: Diagnosis not present

## 2020-05-20 DIAGNOSIS — R350 Frequency of micturition: Secondary | ICD-10-CM | POA: Diagnosis not present

## 2020-05-20 DIAGNOSIS — M359 Systemic involvement of connective tissue, unspecified: Secondary | ICD-10-CM | POA: Diagnosis not present

## 2020-05-20 DIAGNOSIS — N390 Urinary tract infection, site not specified: Secondary | ICD-10-CM | POA: Diagnosis not present

## 2020-05-20 DIAGNOSIS — M542 Cervicalgia: Secondary | ICD-10-CM | POA: Diagnosis not present

## 2020-05-20 DIAGNOSIS — R768 Other specified abnormal immunological findings in serum: Secondary | ICD-10-CM | POA: Diagnosis not present

## 2020-05-20 DIAGNOSIS — G8929 Other chronic pain: Secondary | ICD-10-CM | POA: Diagnosis not present

## 2020-05-20 DIAGNOSIS — M1990 Unspecified osteoarthritis, unspecified site: Secondary | ICD-10-CM | POA: Diagnosis not present

## 2020-05-20 DIAGNOSIS — M791 Myalgia, unspecified site: Secondary | ICD-10-CM | POA: Diagnosis not present

## 2020-06-10 ENCOUNTER — Telehealth: Payer: Self-pay | Admitting: Pharmacist

## 2020-06-10 NOTE — Progress Notes (Signed)
I left the patient a message about her upcoming appointment on 06-11-2020@ 2:00 pm with the clinical pharmacist. She was asked to please have all medication on had to review the pharmacist.

## 2020-06-11 ENCOUNTER — Telehealth: Payer: PPO

## 2020-06-18 DIAGNOSIS — I7300 Raynaud's syndrome without gangrene: Secondary | ICD-10-CM | POA: Diagnosis not present

## 2020-06-18 DIAGNOSIS — G90512 Complex regional pain syndrome I of left upper limb: Secondary | ICD-10-CM | POA: Diagnosis not present

## 2020-06-18 DIAGNOSIS — G894 Chronic pain syndrome: Secondary | ICD-10-CM | POA: Diagnosis not present

## 2020-06-18 DIAGNOSIS — M1990 Unspecified osteoarthritis, unspecified site: Secondary | ICD-10-CM | POA: Diagnosis not present

## 2020-06-18 DIAGNOSIS — M359 Systemic involvement of connective tissue, unspecified: Secondary | ICD-10-CM | POA: Diagnosis not present

## 2020-06-18 DIAGNOSIS — M791 Myalgia, unspecified site: Secondary | ICD-10-CM | POA: Diagnosis not present

## 2020-06-18 DIAGNOSIS — M329 Systemic lupus erythematosus, unspecified: Secondary | ICD-10-CM | POA: Diagnosis not present

## 2020-06-18 DIAGNOSIS — G8929 Other chronic pain: Secondary | ICD-10-CM | POA: Diagnosis not present

## 2020-06-18 DIAGNOSIS — Z79891 Long term (current) use of opiate analgesic: Secondary | ICD-10-CM | POA: Diagnosis not present

## 2020-06-18 DIAGNOSIS — R768 Other specified abnormal immunological findings in serum: Secondary | ICD-10-CM | POA: Diagnosis not present

## 2020-06-18 DIAGNOSIS — Z23 Encounter for immunization: Secondary | ICD-10-CM | POA: Diagnosis not present

## 2020-06-18 DIAGNOSIS — G9050 Complex regional pain syndrome I, unspecified: Secondary | ICD-10-CM | POA: Diagnosis not present

## 2020-06-18 DIAGNOSIS — R5383 Other fatigue: Secondary | ICD-10-CM | POA: Diagnosis not present

## 2020-06-18 DIAGNOSIS — M542 Cervicalgia: Secondary | ICD-10-CM | POA: Diagnosis not present

## 2020-06-18 DIAGNOSIS — R350 Frequency of micturition: Secondary | ICD-10-CM | POA: Diagnosis not present

## 2020-06-18 DIAGNOSIS — N390 Urinary tract infection, site not specified: Secondary | ICD-10-CM | POA: Diagnosis not present

## 2020-06-20 ENCOUNTER — Ambulatory Visit: Payer: PPO

## 2020-06-20 DIAGNOSIS — E039 Hypothyroidism, unspecified: Secondary | ICD-10-CM

## 2020-06-20 DIAGNOSIS — K219 Gastro-esophageal reflux disease without esophagitis: Secondary | ICD-10-CM

## 2020-06-20 NOTE — Chronic Care Management (AMB) (Signed)
Reviewed chart for medication changes ahead of medication coordination call.  No OVs, Consults, or hospital visits since last care coordination call/Pharmacist visit. (If appropriate, list visit date, provider name)  No medication changes indicated OR if recent visit, treatment plan here.  BP Readings from Last 3 Encounters:  05/15/20 110/84  12/27/19 105/66  12/20/19 98/66    Lab Results  Component Value Date   HGBA1C 5.6 11/15/2019     Patient obtains medications through Adherence Packaging  90 Days   Last adherence delivery included:  Methadone 10mg ; two tabs a breakfast, one tab at lunch, one tab at bedtime   Levothyroxine 161mcg; one tab before breakfast   Topiramate 25mg ; one tab in the morning, four tabs at bedtime  Sertraline 100mg ; two tabs before breakfast  Buspirone 15mg ; one half tab before breakfast, one half tab at bedtime  Metformin 500mg ; two tabs before breakfast, one tab at bedtime  Tizanidine 4mg ; four tabs at bedtime    Lamotrigine 100mg ; one tab before breakfast, one tab at bedtime   Montelukast 10mg ; one tab at bedtime  Medroxyprogesterone 2.5mg ; one tabs M,W,F two tabs T,TH,S before breakfast  Estradiol 2mg ; one tab at breakfast  Methotrexate Sodium 2.5mg ; four tabs on Fridays at bedtime  Folic Acid 1mg ; one tab before breakfast  Fluticasone nasal spray; two sprays in both nostrils daily  Bd 3 MI Syringe 25gx1   Patient declined (meds) last month:  Triamcinolone 0.1% cream; apply to face twice daily one week on, one week off PRN  Ketoconazole 2% cream; apply to feet twice daily PRN  Allegra 180mg ; one tab every day (does not need)  Zegerid 40mg ; one cap daily (does not need)  Hydromorphone 4mg ; one tab three times a day PRN (will call when needed)  Vitamin D3 2000; one tab once a day OTC (will call when needed)  Patient is due for next adherence delivery on: 06-21-20. Called patient and reviewed medications and coordinated  delivery.  This delivery to include: Medications requested:   Methadone 10mg ; two tabs a breakfast, one tab at lunch, one tab at bedtime   Levothyroxine 171mcg; one tab before breakfast   Topiramate 25mg ; one tab in the morning, four tabs at bedtime  Sertraline 100mg ; two tabs before breakfast  Buspirone 15mg ; one half tab before breakfast, one half tab at bedtime  Metformin 500mg ; two tabs before breakfast, one tab at bedtime  Tizanidine 4mg ; four tabs at bedtime    Lamotrigine 100mg ; one tab before breakfast, one tab at bedtime   Montelukast 10mg ; one tab at bedtime  Medroxyprogesterone 2.5mg ; one tabs M,W,F two tabs T,TH,S before breakfast  Estradiol 2mg ; one tab at breakfast  Methotrexate Sodium 2.5mg ; four tabs on Fridays at bedtime  Folic Acid 1mg ; one tab before breakfast  Bd 3 MI Syringe 25gx1  Vitamin B12 1,000 mcg    Zofran (brand) 4 mg PRN - vial  Patient declined the following medications:  Triamcinolone 0.1% cream; apply to face twice daily one week on, one week off PRN  Ketoconazole 2% cream; apply to feet twice daily PRN  Allegra 180mg ; one tab every day (does not need)  Zegerid 40mg ; one cap daily (does not need)  Hydromorphone 4mg ; one tab three times a day PRN (will call when needed)  Vitamin D3 2000; one tab once a day OTC (will call when needed)  Patient needs refills for no medications.  Confirmed delivery date of 06-21-20, advised patient that pharmacy will contact them the morning of delivery.

## 2020-06-20 NOTE — Chronic Care Management (AMB) (Signed)
Chronic Care Management Pharmacy  Name: Jo [name]  MRN: [id number] DOB: Sep 01, 1961  Initial Questions: 1. Have you seen any other providers since your last visit? Yes  2. Any changes in your medicines or health? Yes   Chief Complaint/ HPI  Jo [name],  59 y.o. , female presents for their Follow-Up CCM visit with the clinical pharmacist via telephone due to COVID-19 Pandemic.  PCP : Caren Macadam, MD  Their chronic conditions include: Asthma, hypothyroidism, iron deficiency, PCOS, folate deficiency, systemic lupus, GERD, nausea, GI spasms, chronic regional pain syndrome, menopausal symptoms, depression/ anxiety, allergic rhinitis, vitamin B12 deficiency  Office Visits: 04/26/20 Micheline Rough, MD - Patient presented for a video visit for follow up on chronic medical conditions. Patient reports allergies are getting worse. Pain doctor retired and needs records transferred to new pain doctor. Asked her to decrease Zanaflex to 8 mg nightly to help with fatigue. Recommended decreasing Lamictal to 1/2 tablet twice a day for her mood.   11/29/2019- Patient presented for office visit with Dr. Micheline Rough, MD for follow up. Patient complained of feeling tired all the time. Patient reports not feeling well, ringing in ears, dry skind and hair falling out. Patient to obtain ANA, C-reactive protein, sedimentation rate for evaluation for fatigue. Patient unable to tolerate oral iron supplementation. Patient referred to hematology and discuss iron infusions. Patient to return for pending bloodwork.   Consult Visit: 05/15/20 Wilber Bihari, NP (oncology): Patient presented for iron deficiency follow up. Patient reports chronic fatigue and weakness have worsened over the past year. Continue IV iron as needed and folate supplementation regularly. Plan to repeat iron studies every 3 months and follow up visit in 1 year.   12/13/2019- Hematology and Oncology- Patient presented to Ishmael Holter,  NP for iron deficiency anemia and folate deficiency. Patient to begin IV iron infusion. Patient advised to start folic acid as recommended by PCP.    Medications: Outpatient Encounter Medications as of 06/20/2020  Medication Sig Note  . azelastine (ASTELIN) 0.1 % nasal spray Place 1 spray into both nostrils 2 (two) times daily. Use in each nostril as directed   . busPIRone (BUSPAR) 15 MG tablet TAKE 1/2 TABLET TWICE A DAY   . cholecalciferol (VITAMIN D) 1000 UNITS tablet Take 2,000 Units by mouth daily.   . clindamycin (CLEOCIN T) 1 % lotion Apply 1 application topically 2 (two) times daily.    . Cranberry 200 MG CAPS Take 200 mg by mouth 2 (two) times daily. 09/25/2015: Received from: [place]: Take by mouth.  . cyanocobalamin (,VITAMIN B-12,) 1000 MCG/ML injection INJECT 1 ML INTO THE MUSCLE ONCE FOR 1 DOSE alternating every 2 weeks x 2 and then monthly (Patient taking differently: Inject 1,000 mcg into the muscle as directed. )   . erythromycin with ethanol (EMGEL) 2 % gel Apply topically 2 (two) times daily.   Marland Kitchen estradiol (ESTRACE) 2 MG tablet TAKE 1 TABLET BY MOUTH EVERY DAY   . fexofenadine-pseudoephedrine (ALLEGRA-D 24) 180-240 MG 24 hr tablet Take 1 tablet by mouth at bedtime.   . fluticasone (CUTIVATE) 0.05 % cream Apply 1 application topically 2 (two) times daily as needed (rash).    . fluticasone (FLONASE) 50 MCG/ACT nasal spray Place 2 sprays into both nostrils daily.   . folic acid (FOLVITE) 1 MG tablet Take 1 tablet (1 mg total) by mouth daily.   Marland Kitchen lamoTRIgine (LAMICTAL) 100 MG tablet Take 1 tablet (100 mg total) by mouth 2 (two)  times daily. (Patient taking differently: Take 100 mg by mouth 2 (two) times daily. Take 1/2 tab PO in the morning and 1 tablet at HS)   . levothyroxine (SYNTHROID) 125 MCG tablet TAKE 1 TABLET BY MOUTH EVERY DAY   . medroxyPROGESTERone (PROVERA) 2.5 MG tablet TAKE 1 TABLET ALTERNATING WITH 2 TABLETS DAILY   . metFORMIN (GLUCOPHAGE) 500 MG  tablet Take 2 tabs q am, 1 tab qhs.   . methadone (DOLOPHINE) 10 MG tablet Take 1-2 tablets (10-20 mg total) by mouth in the morning, at noon, and at bedtime. Patient reports taking, 2 tablets in the morning, 2 tablets around noon, and 1 tablet at bedtime.   . Metoclopramide HCl 10 MG TBDP Take 1 tablet (10 mg total) by mouth 4 (four) times daily.   . montelukast (SINGULAIR) 10 MG tablet TAKE 1 TABLET BY MOUTH EVERYDAY AT BEDTIME   . nystatin cream (MYCOSTATIN) Apply 1 application topically 2 (two) times daily.   [place] Bicarbonate (ZEGERID) 20-1100 MG CAPS capsule Take 1 capsule by mouth daily.   . ondansetron (ZOFRAN) 4 MG tablet Take 1 tablet (4 mg total) by mouth every 8 (eight) hours as needed for nausea.   . sertraline (ZOLOFT) 100 MG tablet TAKE 2 TABLETS BY MOUTH EVERY DAY   . sucralfate (CARAFATE) 1 g tablet Take 1 tablet (1 g total) by mouth 4 (four) times daily -  with meals and at bedtime. (Patient taking differently: Take 1 g by mouth 3 (three) times daily as needed (indigestion). )   . Syringe/Needle, Disp, (SYRINGE 3CC/25GX1-1/2") 25G X 1-1/2" 3 ML MISC Inject 1cc (1030mcg) B12 q 2-4 weeks as directed   . tiZANidine (ZANAFLEX) 4 MG capsule Take 3 capsules (12 mg total) by mouth at bedtime as needed for muscle spasms.   Marland Kitchen topiramate (TOPAMAX) 25 MG tablet TAKE 1 TABLET 5 TIMES A DAY   . triamcinolone cream (KENALOG) 0.1 %     Facility-Administered Encounter Medications as of 06/20/2020  Medication  . cyanocobalamin ((VITAMIN B-12)) injection 1,000 mcg    Current Diagnosis/Assessment:  Goals Addressed            This Visit's Progress   . Pharmacy Care Plan       CARE PLAN ENTRY  Current Barriers:  . Chronic Disease Management support, education, and care coordination needs related to  Asthma, hypothyroidism, iron deficiency, PCOS, folate deficiency, systemic lupus, GERD, nausea, GI spasms, chronic regional pain syndrome, menopausal symptoms, depression/  anxiety, allergic rhinitis, vitamin B12 deficiency  Current Barriers:  . Polypharmacy; complex patient with multiple comorbidities  . Self-manages medications by organizing medications in empty pill bottles.  Does not use a pill box or other adherence strategies.   Uncontrolled asthma, treatment options limited by: not tolerating previous nebulizers and inhalers  . Current regimen: montelukast 10 mg, 1 tablet at bedtime Hypothyroidism . Current regimen: levothyroxine 176mcg, 1 tablet once daily  Iron deficiency:  . Current regimen: Ferumoxytol (FERAHEME) infusion  . Patient to return in 1 month after blood work.  PCOS . Current regimen: metformin 500mg , 2 tablets every morning and 1 tablet at bedtime Folate deficiency . Current regimen: folic acid 1 mg, 1 tablet once daily  Systemic lupus . Current regimen:  methotrexate 2.5mg , 4 tablets at bedtime Acid reflux  Current regimen: Zegerid 20-1100mg , 1 capsule once daily    sucralfate 1g, 1 tablet four times daily with meals and at bedtime  Nausea/ spasms Current regimen:   Metoclopramide 10mg , 1 tablet  four times daily (patient has been using as needed)  ondansetron (Zofran) 4mg , 1 tablet every eight hours as needed for nausea Complex regional pain syndrome Current regimen:   hydromorphone 4mg , 1 tablet every 4 hours as needed for moderate pain or severe pain   methadone 10mg , (Patient reports taking: 2 tablet in the morning, 2 tablets around noon, and 1 tab at bedtime).  topiramate 25mg , 1 tablet in the morning and 3 tablets at bedtime   tizanidine 4mg , 3 capsules at bedtime as needed for muscle spasms  Menopausal symptoms Current regimen:  . estradiol 2mg , 1 tablet once daily  . medroxyprogesterone 2.5mg , 1 tablet alternating with 2 tablets once daily  Depression/anxiety Current regimen:  . buspirone (Buspar) 15mg , 0.5 tablet (7.5mg ) twice daily  . sertraline 100mg , 2 tablets once daily . lamotrigine 100mg ,1/2 tablet in  AM and 1 tablet in PM  Allergic rhinitis Current regimen:  . azelastine (Astelin) 0.1% nasal spray, 1 spray into both nostrils twice daily  . Allegra, 180mg , 1 tablet at bedtime . fluticasone (Flonase) 63mcg/act nasal spray, 2 sprays into each nostril every day  Vitamin B12 deficiency . Current regimen: cyanocobalamin 1000 mcg/ml, inject 1 ML into muscle every 2 weeks for 2 doses and then once every month   Pharmacist Clinical Goal(s):  Marland Kitchen Over the next 120 days, patient will work with PharmD and provider towards optimized medication management.  . Patient to find and make appointment with a new gynecologist. Appropriate time to review medications for menopause.   Interventions: . Comprehensive medication review performed; medication list updated in electronic medical record . Inter-disciplinary care team collaboration . Instructed patient to continue all medications as instructed.  . Discussed non-pharmacological interventions for acid reflux. Take measures to prevent acid reflux, such as avoiding spicy foods, avoiding caffeine, avoid laying down a few hours after eating, and raising the head of the bed. . Discussed consistent intake of levothyroxine in regards to meals and other medications.   Patient Self Care Activities:  . Patient will take medications as prescribed   Please see past updates related to this goal by clicking on the "Past Updates" button in the selected goal          Asthma  Patient notes shortness of breath has been the same since last time she saw Dr. Ethlyn Gallery. She notes still having a non- persistent cough.   Eosinophil count:   Lab Results  Component Value Date/Time   EOSPCT 3 05/15/2020 03:08 PM  %                               Eos (Absolute):  Lab Results  Component Value Date/Time   EOSABS 0.2 05/15/2020 03:08 PM   Tobacco Status:  Social History   Tobacco Use  Smoking Status Never Smoker  Smokeless Tobacco Never Used   Patient has failed  these meds in past: patient states previous nebulizers and inhalers have not worked for her (makes her shaky).   Patient is currently controlled on the following medications:   montelukast (Singulair) 10mg , 1 tablet daily at bedtime   Using maintenance inhaler regularly? No- does not use Frequency of rescue inhaler use:  NA- does not use   Plan Continue current medications.   Hypothyroidism   TSH  Date Value Ref Range Status  11/15/2019 2.19 0.35 - 4.50 uIU/mL Final    Patient has failed these meds in past: none  Patient is currently  controlled on the following medications:   levothyroxine 131mcg, 1 tablet once daily   We discussed:  persistant administration of levothyroxine (regards to food and other medications)  - Patient reports taking in the morning with her coffee.   Plan Continue current medications.   Iron deficiency   Patient has failed these meds in past: oral supplement Patient is currently controlled on the following medications:   Ferumoxytol [facility]) infusion   Hemoglobin & Hematocrit     Component Value Date/Time   HGB 12.0 05/15/2020 1508   HCT 36.8 05/15/2020 1508   Iron/TIBC/Ferritin/ %Sat    Component Value Date/Time   IRON 130 05/15/2020 1509   TIBC 325 05/15/2020 1509   FERRITIN 205 05/15/2020 1509   IRONPCTSAT 40 05/15/2020 1509   Plan Patient is getting iron studies checked every 3 months and supplemented as needed - followed by hematology. Continue current medications.   PCOS   Recent Relevant Labs: Lab Results  Component Value Date/Time   HGBA1C 5.6 11/15/2019 09:49 AM    Patient has failed these meds in past: none   Patient is currently controlled on the following medications:   metformin 500mg , 2 tablets every morning and 1 tablet at bedtime   We discussed: diet and exercise extensively    Plan Managed by Dr. Wilber Bihari. Continue current medications  Folate deficiency  Patient is currently managed on the  following medications:   folic acid 1 mg, 1 tablet once daily   Folate  05/15/20: 91.9 12/13/19: 26.3 11/15/19: 6.0  Plan Continue current medications.   Systemic lupus  Patient stated she was recently diagnosed with lupus. Experienced side effects with higher dose of methotrexate (mood instability) and the 4 tablets are working.  Patient has failed these meds in past: Leflunomide (headaches, pain), hydroxychloroquine (nausea) Patient is currently controlled on the following medications:  - methotrexate 2.5 mg  4 tablets at bedtime  Plan  Managed by Dr. [name] - working on patient assistance for a new injectable medicine. Continue current medications.   GI health (GERD, nausea, GI spasms)   Patient reports minor symptoms of reflux and only really started with lupus flare.  Patient has failed these meds in past:  Patient is currently controlled on the following medications:   Zegerid 20-1100mg , 1 capsule once daily    sucralfate 1g, 1 tablet four times daily with meals and at bedtime   Metoclopramide 10mg , 1 tablet four times daily (patient has been using as needed)  ondansetron (Zofran) 4mg , 1 tablet every eight hours as needed for nausea  We discussed:  non-pharmacological interventions for acid reflux. Take measures to prevent acid reflux, such as avoiding spicy foods, avoiding caffeine, avoid laying down a few hours after eating, and raising the head of the bed.   Plan Managed by Dr. Silvano Rusk. Continue current medications  Complex regional pain syndrome   Patient has failed these meds in past: patient states she has tried multiple treatments; she states she has been in several studies and states she does not want to participate in anymore trials.   Patient is currently controlled on the following medications:   hydromorphone 4mg , 1 tablet every 4 hours as needed for moderate pain or severe pain   methadone 10mg , (Patient reports taking: 2 tablet in the  morning, 2 tablets around noon, and 1 tab at bedtime).  topiramate 25mg , 1 tablet in the morning and 3 tablets at bedtime   tizanidine 4mg , 4 capsules at bedtime as needed for  muscle spasms    Plan Managed by new pain management Dr. Nicholaus Bloom. Continue current medications  Menopausal symptoms   Patient stated taking this for about 10 years now. Patient is still in process of finding a new gynecologist.  Patient has failed these meds in past: none Patient is currently controlled on the following medications:  - estradiol 2mg , 1 tablet once daily  - medroxyprogesterone 2.5mg , 1 tablet alternating with 2 tablets once daily   Plan Patient to obtain a new gynecologist and plans to discuss duration of hormones.   Depression/ anxiety   Patient has failed these meds in past: patient stated trying several medications and "did not do well" on them.   Patient is currently controlled on the following medications:  - buspirone (Buspar) 15mg , 0.5 tablet (7.5mg ) twice daily  - sertraline 100mg , 2 tablets once daily - lamotrigine 100mg ,1/2 tablet in AM and 1 tablet in PM   Plan Continue current medications.  Allergic rhinitis   Patient stated still having some symptoms. Patient mentioned using azelastine nasal spray once daily.   Patient has failed these meds in past: none  Patient is currently controlled on the following medications:  - azelastine (Astelin) 0.1% nasal spray, 1 spray into both nostrils twice daily  - Allegra, 180mg , 1 tablet at bedtime - fluticasone (Flonase) 14mcg/act nasal spray, 2 sprays into each nostril every day   Plan Continue current medications  Vitamin B-12 deficiency   Patient has failed these meds in past: none Patient is currently controlled on the following medications:  - cyanocobalamin 1000 mcg/ml, inject 1 ML into muscle every 2 weeks for 2 doses and then once every month  Vitamin B12: 119 (12/13/2019)   Plan Continue current medications    Vaccines   Reviewed and discussed patient's vaccination history.    Immunization History  Administered Date(s) Administered  . Influenza, Seasonal, Injecte, Preservative Fre 06/21/2014, 06/26/2015, 07/29/2016  . Influenza,inj,Quad PF,6+ Mos 07/15/2017, 06/15/2018, 06/13/2019  . Influenza,inj,quad, With Preservative 07/11/2014, 07/19/2017  . PFIZER SARS-COV-2 Vaccination 01/20/2020, 02/18/2020  . Pneumococcal Polysaccharide-23 10/09/2015  . Tdap 10/23/2007   We discussed: timing of vaccines with starting new medicine for lupus  Plan Patient reports already getting her flu shot this week. Recommended patient receive Shingrix and tetanus vaccine in office/at pharmacy.    Medication Management   Pt uses [facility] for all medications Uses pill box? No - adherence packaging Pt endorses 90% compliance - patient has missed some doses of evening medicine due to fatigue  We discussed: Discussed benefits of medication synchronization, packaging and delivery as well as enhanced pharmacist oversight with [facility].  Plan  Utilize [facility] pharmacy for medication synchronization, packaging and delivery   Follow up: 4 month phone visit   [name], PharmD Clinical Pharmacist [facility] [place]

## 2020-06-28 ENCOUNTER — Telehealth: Payer: PPO | Admitting: Family Medicine

## 2020-07-16 DIAGNOSIS — G90512 Complex regional pain syndrome I of left upper limb: Secondary | ICD-10-CM | POA: Diagnosis not present

## 2020-07-16 DIAGNOSIS — G894 Chronic pain syndrome: Secondary | ICD-10-CM | POA: Diagnosis not present

## 2020-07-16 DIAGNOSIS — M542 Cervicalgia: Secondary | ICD-10-CM | POA: Diagnosis not present

## 2020-07-16 DIAGNOSIS — Z79891 Long term (current) use of opiate analgesic: Secondary | ICD-10-CM | POA: Diagnosis not present

## 2020-07-17 ENCOUNTER — Other Ambulatory Visit: Payer: Self-pay

## 2020-07-17 ENCOUNTER — Telehealth (INDEPENDENT_AMBULATORY_CARE_PROVIDER_SITE_OTHER): Payer: PPO | Admitting: Family Medicine

## 2020-07-17 ENCOUNTER — Encounter: Payer: Self-pay | Admitting: Family Medicine

## 2020-07-17 VITALS — BP 117/70 | Wt 153.0 lb

## 2020-07-17 DIAGNOSIS — R8290 Unspecified abnormal findings in urine: Secondary | ICD-10-CM | POA: Diagnosis not present

## 2020-07-17 DIAGNOSIS — M329 Systemic lupus erythematosus, unspecified: Secondary | ICD-10-CM | POA: Diagnosis not present

## 2020-07-17 DIAGNOSIS — G9059 Complex regional pain syndrome I of other specified site: Secondary | ICD-10-CM | POA: Diagnosis not present

## 2020-07-17 DIAGNOSIS — K589 Irritable bowel syndrome without diarrhea: Secondary | ICD-10-CM

## 2020-07-17 DIAGNOSIS — E611 Iron deficiency: Secondary | ICD-10-CM | POA: Diagnosis not present

## 2020-07-17 DIAGNOSIS — J4520 Mild intermittent asthma, uncomplicated: Secondary | ICD-10-CM | POA: Diagnosis not present

## 2020-07-17 DIAGNOSIS — E039 Hypothyroidism, unspecified: Secondary | ICD-10-CM

## 2020-07-17 DIAGNOSIS — K219 Gastro-esophageal reflux disease without esophagitis: Secondary | ICD-10-CM | POA: Diagnosis not present

## 2020-07-17 DIAGNOSIS — Z1159 Encounter for screening for other viral diseases: Secondary | ICD-10-CM

## 2020-07-17 DIAGNOSIS — F339 Major depressive disorder, recurrent, unspecified: Secondary | ICD-10-CM

## 2020-07-17 MED ORDER — METHOTREXATE 2.5 MG PO TABS
10.0000 mg | ORAL_TABLET | ORAL | Status: DC
Start: 2020-07-17 — End: 2021-03-10

## 2020-07-17 NOTE — Progress Notes (Signed)
Virtual Visit via Video Note  I connected with Jo [name] on 07/17/20 at  1:30 PM EDT by a video enabled telemedicine application and verified that I am speaking with the correct person using two identifiers.  Location patient: home Location provider: [place], [place] [address] Persons participating in the virtual visit: patient, provider  I discussed the limitations of evaluation and management by telemedicine and the availability of in person appointments. The patient expressed understanding and agreed to proceed.   Jo [name] DOB: 13-Jun-1961 Encounter date: 07/17/2020  This is a 59 y.o. female who presents with Chief Complaint  Patient presents with  . Follow-up    History of present illness: Last visit was 04/26/20 - at that time we discussed new dx of systemic lupus.   Feeling rough, exhausted. Lupus, RA, increased pain with change of seasons. Working on patient assistance for multiple medications.   She is seeing Dr. Hardin Negus now for pain management. He did lower pain medication; took out middle of day dose. It is hard for her this time of year when season is getting colder. They are working on getting her approved for nucynta. Didn't realize how much methadone was helping until she cut back. She is down to 3 tablets/day and is having a hard time in the afternoon without medication.   She had tried to decrease lamictal to half; but had to return to 100mg  BID. Continues with zoloft 200mg , buspar 7.5mg  BID. Conflict with neighbor has added stress to her.   Breathing has been ok. Still with some cough. sometimes getting more easily winded. Heart feels rapid.   Still with chronic stomach issues, discomfort.    Really fatigued.    Allergies  Allergen Reactions  . Bee Pollen Anaphylaxis  . Diazepam     sucidal depression  . Prednisone Nausea And Vomiting    Per pt can not take oral steroids   . Amoxicillin     Severe  diarrhea At high dose   . Doxycycline     Pt does not remember reaction  . Erythromycin     n & v  . Morphine     n & v  . Propoxyphene N-Acetaminophen     n & V  . Sulfa Antibiotics     MIGRAINES  . Tetracyclines & Related     N & v  . Propoxyphene Other (See Comments)   Current Meds  Medication Sig  . azelastine (ASTELIN) 0.1 % nasal spray Place 1 spray into both nostrils 2 (two) times daily. Use in each nostril as directed  . busPIRone (BUSPAR) 15 MG tablet TAKE 1/2 TABLET TWICE A DAY  . cholecalciferol (VITAMIN D) 1000 UNITS tablet Take 2,000 Units by mouth daily.  . clindamycin (CLEOCIN T) 1 % lotion Apply 1 application topically 2 (two) times daily.   . Cranberry 200 MG CAPS Take 200 mg by mouth 2 (two) times daily.  . cyanocobalamin (,VITAMIN B-12,) 1000 MCG/ML injection INJECT 1 ML INTO THE MUSCLE ONCE FOR 1 DOSE alternating every 2 weeks x 2 and then monthly (Patient taking differently: Inject 1,000 mcg into the muscle as directed. )  . erythromycin with ethanol (EMGEL) 2 % gel Apply topically 2 (two) times daily.  Marland Kitchen estradiol (ESTRACE) 2 MG tablet TAKE 1 TABLET BY MOUTH EVERY DAY  . fexofenadine-pseudoephedrine (ALLEGRA-D 24) 180-240 MG 24 hr tablet Take 1 tablet by mouth at bedtime.  . fluticasone (CUTIVATE) 0.05 %  cream Apply 1 application topically 2 (two) times daily as needed (rash).   . fluticasone (FLONASE) 50 MCG/ACT nasal spray Place 2 sprays into both nostrils daily.  . folic acid (FOLVITE) 1 MG tablet Take 1 tablet (1 mg total) by mouth daily.  Marland Kitchen lamoTRIgine (LAMICTAL) 100 MG tablet Take 1 tablet (100 mg total) by mouth 2 (two) times daily. (Patient taking differently: Take 100 mg by mouth 2 (two) times daily. Take 1/2 tab PO in the morning and 1 tablet at HS)  . levothyroxine (SYNTHROID) 125 MCG tablet TAKE 1 TABLET BY MOUTH EVERY DAY  . medroxyPROGESTERone (PROVERA) 2.5 MG tablet TAKE 1 TABLET ALTERNATING WITH 2 TABLETS DAILY  . metFORMIN (GLUCOPHAGE) 500 MG  tablet Take 2 tabs q am, 1 tab qhs.  . methadone (DOLOPHINE) 10 MG tablet Take 1-2 tablets (10-20 mg total) by mouth in the morning, at noon, and at bedtime. Patient reports taking, 2 tablets in the morning, 2 tablets around noon, and 1 tablet at bedtime.  . Metoclopramide HCl 10 MG TBDP Take 1 tablet (10 mg total) by mouth 4 (four) times daily.  . montelukast (SINGULAIR) 10 MG tablet TAKE 1 TABLET BY MOUTH EVERYDAY AT BEDTIME  . nystatin cream (MYCOSTATIN) Apply 1 application topically 2 (two) times daily.  Earney Navy Bicarbonate (ZEGERID) 20-1100 MG CAPS capsule Take 1 capsule by mouth daily.  . ondansetron (ZOFRAN) 4 MG tablet Take 1 tablet (4 mg total) by mouth every 8 (eight) hours as needed for nausea.  . sertraline (ZOLOFT) 100 MG tablet TAKE 2 TABLETS BY MOUTH EVERY DAY  . sucralfate (CARAFATE) 1 g tablet Take 1 tablet (1 g total) by mouth 4 (four) times daily -  with meals and at bedtime. (Patient taking differently: Take 1 g by mouth 3 (three) times daily as needed (indigestion). )  . Syringe/Needle, Disp, (SYRINGE 3CC/25GX1-1/2") 25G X 1-1/2" 3 ML MISC Inject 1cc (1035mcg) B12 q 2-4 weeks as directed  . tiZANidine (ZANAFLEX) 4 MG capsule Take 3 capsules (12 mg total) by mouth at bedtime as needed for muscle spasms.  Marland Kitchen topiramate (TOPAMAX) 25 MG tablet TAKE 1 TABLET 5 TIMES A DAY  . triamcinolone cream (KENALOG) 0.1 %    Current Facility-Administered Medications for the 07/17/20 encounter (Video Visit) with Caren Macadam, MD  Medication  . cyanocobalamin ((VITAMIN B-12)) injection 1,000 mcg    Review of Systems  Constitutional: Positive for fatigue. Negative for chills and fever.  Respiratory: Negative for cough, chest tightness, shortness of breath and wheezing.   Cardiovascular: Negative for chest pain, palpitations and leg swelling.  Gastrointestinal: Positive for abdominal pain.  Genitourinary: Negative for dysuria.       Notes urine is cloudy  Musculoskeletal:  Positive for arthralgias, back pain and myalgias.  Psychiatric/Behavioral: The patient is nervous/anxious.     Objective:  BP 117/70   Wt 153 lb (69.4 kg)   BMI 27.10 kg/m   Weight: 153 lb (69.4 kg)   BP Readings from Last 3 Encounters:  07/17/20 117/70  05/15/20 110/84  12/27/19 105/66   Wt Readings from Last 3 Encounters:  07/17/20 153 lb (69.4 kg)  05/15/20 159 lb 6.4 oz (72.3 kg)  04/26/20 143 lb (64.9 kg)    EXAM:  GENERAL: alert, oriented, appears well and in no acute distress  HEENT: atraumatic, conjunctiva clear, no obvious abnormalities on inspection of external nose and ears  NECK: normal movements of the head and neck  LUNGS: on inspection no signs of respiratory  distress, breathing rate appears normal, no obvious gross SOB, gasping or wheezing  CV: no obvious cyanosis  MS: moves all visible extremities without noticeable abnormality  PSYCH/NEURO: pleasant and cooperative, no obvious depression or anxiety, speech and thought processing grossly intact   Assessment/Plan   1. Complex regional pain syndrome type 1 affecting other site She is following with you pain management.  She is having some difficulty with adjusting to new pain regimen, but hopeful that once started on Nucynta (if covered) that this will help her with overall pain control.  2. Hypothyroidism, unspecified type We will recheck blood work.  Has been stable. - TSH; Future  3. Gastroesophageal reflux disease, unspecified whether esophagitis present She has followed with GI and continues to have stomach discomfort and acid/gut motility issues.  Currently taking Zegerid 20-1100 mg, Reglan 10 mg 4 times a day, Carafate 1 g 4 times a day, Zofran 4 mg 3 times daily as needed.  4. Mild intermittent asthma without complication Allergies are decently controlled with Singulair 10 mg, Flonase, Astelin, Allegra.  Breathing has been stable.  5. Irritable bowel syndrome, unspecified type See above.   Following with GI.  6. Iron deficiency Iron levels are stable when last checked.  We will continue to monitor.  7. Systemic lupus erythematosus, unspecified SLE type, unspecified organ involvement status [facility]) Now following with rheumatology. - C-reactive protein; Future - Sedimentation rate; Future  8. Depression, recurrent ([place]) Mood has been difficult given multiple new diagnoses, back and joint pain, fatigue, but she is hopeful with new treatment ideas that she will be able to get some better control of symptoms.  We will continue to monitor mood.  9. Cloudy urine - Urinalysis with Culture Reflex; Future  10. Encounter for hepatitis C screening test for low risk patient - Hepatitis C antibody; Future  Return in about 3 months (around 10/17/2020) for Chronic condition visit.  I discussed the assessment and treatment plan with the patient. The patient was provided an opportunity to ask questions and all were answered. The patient agreed with the plan and demonstrated an understanding of the instructions.   The patient was advised to call back or seek an in-person evaluation if the symptoms worsen or if the condition fails to improve as anticipated.  I provided 30 minutes of non-face-to-face time during this encounter.   Micheline Rough, MD

## 2020-07-22 DIAGNOSIS — F339 Major depressive disorder, recurrent, unspecified: Secondary | ICD-10-CM | POA: Insufficient documentation

## 2020-07-25 ENCOUNTER — Telehealth: Payer: Self-pay | Admitting: *Deleted

## 2020-07-25 NOTE — Telephone Encounter (Signed)
Spoke with the pt and scheduled a lab appt for 11/9.  Patient was advised to go to a pharmacy to receive a Tdap vaccine due to insurance, flu vaccine given and updated in the chart.  Patient agreed to schedule a mammogram

## 2020-07-25 NOTE — Telephone Encounter (Signed)
-----   Message from Caren Macadam, MD sent at 07/22/2020  8:42 AM EDT ----- Please set up lab visit for her. If willing, she is also due for Tdap, flu shot, and mammogram.

## 2020-07-30 ENCOUNTER — Other Ambulatory Visit: Payer: Self-pay

## 2020-07-30 ENCOUNTER — Other Ambulatory Visit (INDEPENDENT_AMBULATORY_CARE_PROVIDER_SITE_OTHER): Payer: PPO

## 2020-07-30 DIAGNOSIS — Z1159 Encounter for screening for other viral diseases: Secondary | ICD-10-CM | POA: Diagnosis not present

## 2020-07-30 DIAGNOSIS — M329 Systemic lupus erythematosus, unspecified: Secondary | ICD-10-CM

## 2020-07-30 DIAGNOSIS — E039 Hypothyroidism, unspecified: Secondary | ICD-10-CM | POA: Diagnosis not present

## 2020-07-30 DIAGNOSIS — R8290 Unspecified abnormal findings in urine: Secondary | ICD-10-CM

## 2020-07-30 NOTE — Addendum Note (Signed)
Addended by: Marrion Coy on: 07/30/2020 01:57 PM   Modules accepted: Orders

## 2020-07-31 LAB — C-REACTIVE PROTEIN: CRP: 0.9 mg/L (ref <8.0)

## 2020-07-31 LAB — HEPATITIS C ANTIBODY
Hepatitis C Ab: NONREACTIVE
SIGNAL TO CUT-OFF: 0.01 (ref <1.00)

## 2020-07-31 LAB — SEDIMENTATION RATE: Sed Rate: 6 mm/h (ref 0–30)

## 2020-07-31 LAB — TSH: TSH: 1.82 mIU/L (ref 0.40–4.50)

## 2020-08-02 LAB — URINALYSIS W MICROSCOPIC + REFLEX CULTURE
Bacteria, UA: NONE SEEN /HPF
Bilirubin Urine: NEGATIVE
Glucose, UA: NEGATIVE
Hgb urine dipstick: NEGATIVE
Hyaline Cast: NONE SEEN /LPF
Ketones, ur: NEGATIVE
Nitrites, Initial: NEGATIVE
Protein, ur: NEGATIVE
Specific Gravity, Urine: 1.013 (ref 1.001–1.03)
Squamous Epithelial / HPF: >=28 /HPF — AB (ref <=5)
pH: 7 (ref 5.0–8.0)

## 2020-08-02 LAB — CULTURE INDICATED

## 2020-08-02 LAB — URINE CULTURE

## 2020-08-08 NOTE — Addendum Note (Signed)
Addended by: Agnes Lawrence on: 08/08/2020 03:40 PM   Modules accepted: Orders

## 2020-08-13 ENCOUNTER — Other Ambulatory Visit: Payer: Self-pay

## 2020-08-13 ENCOUNTER — Other Ambulatory Visit: Payer: PPO

## 2020-08-13 ENCOUNTER — Inpatient Hospital Stay: Payer: PPO | Attending: Adult Health

## 2020-08-13 DIAGNOSIS — E611 Iron deficiency: Secondary | ICD-10-CM | POA: Diagnosis not present

## 2020-08-13 DIAGNOSIS — E538 Deficiency of other specified B group vitamins: Secondary | ICD-10-CM | POA: Diagnosis not present

## 2020-08-13 DIAGNOSIS — G894 Chronic pain syndrome: Secondary | ICD-10-CM | POA: Diagnosis not present

## 2020-08-13 DIAGNOSIS — G90512 Complex regional pain syndrome I of left upper limb: Secondary | ICD-10-CM | POA: Diagnosis not present

## 2020-08-13 DIAGNOSIS — M542 Cervicalgia: Secondary | ICD-10-CM | POA: Diagnosis not present

## 2020-08-13 DIAGNOSIS — Z79891 Long term (current) use of opiate analgesic: Secondary | ICD-10-CM | POA: Diagnosis not present

## 2020-08-13 LAB — CMP (CANCER CENTER ONLY)
ALT: 10 U/L (ref 0–44)
AST: 15 U/L (ref 15–41)
Albumin: 4.2 g/dL (ref 3.5–5.0)
Alkaline Phosphatase: 55 U/L (ref 38–126)
Anion gap: 11 (ref 5–15)
BUN: 16 mg/dL (ref 6–20)
CO2: 23 mmol/L (ref 22–32)
Calcium: 9.4 mg/dL (ref 8.9–10.3)
Chloride: 106 mmol/L (ref 98–111)
Creatinine: 0.99 mg/dL (ref 0.44–1.00)
GFR, Estimated: >60 mL/min (ref >60)
Glucose, Bld: 80 mg/dL (ref 70–99)
Potassium: 4.1 mmol/L (ref 3.5–5.1)
Sodium: 140 mmol/L (ref 135–145)
Total Bilirubin: 0.2 mg/dL — ABNORMAL LOW (ref 0.3–1.2)
Total Protein: 7.2 g/dL (ref 6.5–8.1)

## 2020-08-13 LAB — CBC WITH DIFFERENTIAL (CANCER CENTER ONLY)
Abs Immature Granulocytes: 0.02 10*3/uL (ref 0.00–0.07)
Basophils Absolute: 0.1 10*3/uL (ref 0.0–0.1)
Basophils Relative: 1 %
Eosinophils Absolute: 0.3 10*3/uL (ref 0.0–0.5)
Eosinophils Relative: 5 %
HCT: 38.4 % (ref 36.0–46.0)
Hemoglobin: 12.5 g/dL (ref 12.0–15.0)
Immature Granulocytes: 0 %
Lymphocytes Relative: 33 %
Lymphs Abs: 1.7 10*3/uL (ref 0.7–4.0)
MCH: 31.2 pg (ref 26.0–34.0)
MCHC: 32.6 g/dL (ref 30.0–36.0)
MCV: 95.8 fL (ref 80.0–100.0)
Monocytes Absolute: 0.3 10*3/uL (ref 0.1–1.0)
Monocytes Relative: 6 %
Neutro Abs: 2.8 10*3/uL (ref 1.7–7.7)
Neutrophils Relative %: 55 %
Platelet Count: 257 10*3/uL (ref 150–400)
RBC: 4.01 MIL/uL (ref 3.87–5.11)
RDW: 13.1 % (ref 11.5–15.5)
WBC Count: 5 10*3/uL (ref 4.0–10.5)
nRBC: 0 % (ref 0.0–0.2)

## 2020-08-13 LAB — FOLATE: Folate: 56.7 ng/mL (ref >5.9)

## 2020-08-14 ENCOUNTER — Other Ambulatory Visit: Payer: PPO

## 2020-08-14 ENCOUNTER — Telehealth: Payer: Self-pay | Admitting: Pharmacist

## 2020-08-14 LAB — FERRITIN: Ferritin: 152 ng/mL (ref 11–307)

## 2020-08-14 LAB — IRON AND TIBC
Iron: 108 ug/dL (ref 41–142)
Saturation Ratios: 31 % (ref 21–57)
TIBC: 353 ug/dL (ref 236–444)
UIBC: 244 ug/dL (ref 120–384)

## 2020-08-14 NOTE — Chronic Care Management (AMB) (Signed)
Chronic Care Management Pharmacy Assistant   Name: Jo [name]  MRN: [id number] DOB: 05-16-1961  Reason for Encounter: Medication Review  Patient Questions:  1.  Have you seen any other providers since your last visit? No  2.  Any changes in your medicines or health? No   PCP : Caren Macadam, MD  Allergies:   Allergies  Allergen Reactions  . Bee Pollen Anaphylaxis  . Diazepam     sucidal depression  . Prednisone Nausea And Vomiting    Per pt can not take oral steroids   . Amoxicillin     Severe diarrhea At high dose   . Doxycycline     Pt does not remember reaction  . Erythromycin     n & v  . Morphine     n & v  . Propoxyphene N-Acetaminophen     n & V  . Sulfa Antibiotics     MIGRAINES  . Tetracyclines & Related     N & v  . Propoxyphene Other (See Comments)    Medications: Outpatient Encounter Medications as of 08/14/2020  Medication Sig Note  . azelastine (ASTELIN) 0.1 % nasal spray Place 1 spray into both nostrils 2 (two) times daily. Use in each nostril as directed   . busPIRone (BUSPAR) 15 MG tablet TAKE 1/2 TABLET TWICE A DAY   . cholecalciferol (VITAMIN D) 1000 UNITS tablet Take 2,000 Units by mouth daily.   . clindamycin (CLEOCIN T) 1 % lotion Apply 1 application topically 2 (two) times daily.    . Cranberry 200 MG CAPS Take 200 mg by mouth 2 (two) times daily. 09/25/2015: Received from: [place]: Take by mouth.  . cyanocobalamin (,VITAMIN B-12,) 1000 MCG/ML injection INJECT 1 ML INTO THE MUSCLE ONCE FOR 1 DOSE alternating every 2 weeks x 2 and then monthly (Patient taking differently: Inject 1,000 mcg into the muscle as directed. )   . erythromycin with ethanol (EMGEL) 2 % gel Apply topically 2 (two) times daily.   Marland Kitchen estradiol (ESTRACE) 2 MG tablet TAKE 1 TABLET BY MOUTH EVERY DAY   . fexofenadine-pseudoephedrine (ALLEGRA-D 24) 180-240 MG 24 hr tablet Take 1 tablet by mouth at bedtime.   . fluticasone (CUTIVATE) 0.05 % cream  Apply 1 application topically 2 (two) times daily as needed (rash).    . fluticasone (FLONASE) 50 MCG/ACT nasal spray Place 2 sprays into both nostrils daily.   . folic acid (FOLVITE) 1 MG tablet Take 1 tablet (1 mg total) by mouth daily.   Marland Kitchen lamoTRIgine (LAMICTAL) 100 MG tablet Take 1 tablet (100 mg total) by mouth 2 (two) times daily. (Patient taking differently: Take 100 mg by mouth 2 (two) times daily. Take 1/2 tab PO in the morning and 1 tablet at HS)   . levothyroxine (SYNTHROID) 125 MCG tablet TAKE 1 TABLET BY MOUTH EVERY DAY   . medroxyPROGESTERone (PROVERA) 2.5 MG tablet TAKE 1 TABLET ALTERNATING WITH 2 TABLETS DAILY   . metFORMIN (GLUCOPHAGE) 500 MG tablet Take 2 tabs q am, 1 tab qhs.   . methadone (DOLOPHINE) 10 MG tablet Take 1-2 tablets (10-20 mg total) by mouth in the morning, at noon, and at bedtime. Patient reports taking, 2 tablets in the morning, 2 tablets around noon, and 1 tablet at bedtime.   . methotrexate (RHEUMATREX) 2.5 MG tablet Take 4 tablets (10 mg total) by mouth once a week.   . Metoclopramide HCl 10 MG TBDP Take 1 tablet (10 mg  total) by mouth 4 (four) times daily.   . montelukast (SINGULAIR) 10 MG tablet TAKE 1 TABLET BY MOUTH EVERYDAY AT BEDTIME   . nystatin cream (MYCOSTATIN) Apply 1 application topically 2 (two) times daily.   Earney Navy Bicarbonate (ZEGERID) 20-1100 MG CAPS capsule Take 1 capsule by mouth daily.   . ondansetron (ZOFRAN) 4 MG tablet Take 1 tablet (4 mg total) by mouth every 8 (eight) hours as needed for nausea.   . sertraline (ZOLOFT) 100 MG tablet TAKE 2 TABLETS BY MOUTH EVERY DAY   . sucralfate (CARAFATE) 1 g tablet Take 1 tablet (1 g total) by mouth 4 (four) times daily -  with meals and at bedtime. (Patient taking differently: Take 1 g by mouth 3 (three) times daily as needed (indigestion). )   . Syringe/Needle, Disp, (SYRINGE 3CC/25GX1-1/2") 25G X 1-1/2" 3 ML MISC Inject 1cc (107mcg) B12 q 2-4 weeks as directed   . tiZANidine  (ZANAFLEX) 4 MG capsule Take 3 capsules (12 mg total) by mouth at bedtime as needed for muscle spasms.   Marland Kitchen topiramate (TOPAMAX) 25 MG tablet TAKE 1 TABLET 5 TIMES A DAY   . triamcinolone cream (KENALOG) 0.1 %     Facility-Administered Encounter Medications as of 08/14/2020  Medication  . cyanocobalamin ((VITAMIN B-12)) injection 1,000 mcg    Current Diagnosis: Patient Active Problem List   Diagnosis Date Noted  . Depression, recurrent ([place]) 07/22/2020  . Systemic lupus erythematosus ([place]) 04/29/2020  . Iron deficiency 12/13/2019  . Reflex sympathetic dystrophy of lower extremity 06/12/2019  . Disorder of thyroid gland 06/12/2019  . Falls 09/25/2015  . Musculoskeletal neck pain 09/25/2015  . Headache 09/25/2015  . Memory changes 09/25/2015  . Blurry vision 09/25/2015  . Neck pain 09/25/2015  . Polycystic ovaries 07/11/2013  . Hypothyroidism 05/04/2013  . Complex regional pain syndrome I, unspecified 03/06/2013  . Extremity pain 03/06/2013  . Helicobacter pylori infection 12/22/2012  . History of colonic polyps 02/25/2009  . OBESITY 01/14/2008  . DEPRESSION/ANXIETY 01/14/2008  . Asthma 01/14/2008  . GERD 01/14/2008  . Irritable colon 01/14/2008    Goals Addressed   None    Reviewed chart for medication changes ahead of medication coordination call. No OVs, Consults, or hospital visits since last care coordination call/Pharmacist visit.  No medication changes indicated OR if recent visit, treatment plan here.  BP Readings from Last 3 Encounters:  07/17/20 117/70  05/15/20 110/84  12/27/19 105/66    Lab Results  Component Value Date   HGBA1C 5.6 11/15/2019     Patient obtains medications through Adherence Packaging  90 Days   Last adherence delivery included:  Methadone 10mg ; two tabs a breakfast, one tab at lunch, one tab at bedtime   Levothyroxine 125 mcg; one tab before breakfast   Topiramate 25 mg; one tab in the morning, four tabs at  bedtime  Sertraline 10mg ; two tabs before breakfast  Buspirone 15 mg; one half tab before breakfast, one half tab at bedtime  Metformin 500 mg; two tabs before breakfast, one tab at bedtime  Tizanidine 4 mg; four tabs at bedtime    Lamotrigine 10mg ; one tab before breakfast, one tab at bedtime   Montelukast 10mg ; one tab at bedtime  Medroxyprogesterone 2.5 mg; one tabs M,W,F two tabs T,TH,S before breakfast  Estradiol 2 mg; one tab at breakfast  Methotrexate Sodium 2.5 mg; four tabs on Fridays at bedtime  Folic Acid 1 mg; one tab before breakfast  Fluticasone nasal spray; two sprays in  both nostrils daily  Bd 3 MI Syringe 25gx1  Patient declined the following medications due to PRN and Supply on hand:  Triamcinolone 0.1% cream; apply to face twice daily one week on, one week off PRN  Ketoconazole 2% cream; apply to feet twice daily PRN  Allegra 180 mg; one tab every day (does not need)  Zegerid 40 mg; one cap daily(does not need)  Hydromorphone 4 mg; one tab three times a day PRN (will call when needed)  Vitamin D3 2000; one tab once a day OTC (will call when needed)   I spoke with the patient and review medications. There are no changes in medications currently. The patient is taking the following medications:   Methadone 10mg ; two tabs a breakfast, one tab at lunch, one tab at bedtime   Levothyroxine 125 mcg; one tab before breakfast   Topiramate 25 mg; one tab in the morning, four tabs at bedtime  Sertraline 10mg ; two tabs before breakfast  Buspirone 15 mg; one half tab before breakfast, one half tab at bedtime  Metformin 500 mg; two tabs before breakfast, one tab at bedtime  Tizanidine 4 mg; four tabs at bedtime    Lamotrigine 10mg ; one tab before breakfast, one tab at bedtime   Montelukast 10mg ; one tab at bedtime  Medroxyprogesterone 2.5 mg; one tabs M,W,F two tabs T,TH,S before breakfast  Estradiol 2 mg; one tab at breakfast  Methotrexate  Sodium 2.5 mg; four tabs on Fridays at bedtime  Folic Acid 1 mg; one tab before breakfast  Fluticasone nasal spray; two sprays in both nostrils daily  Bd 3 MI Syringe 25gx1  Triamcinolone 0.1% cream; apply to face twice daily one week on, one week off PRN  Ketoconazole 2% cream; apply to feet twice daily PRN  Allegra 180 mg; one tab every day (does not need)  Zegerid 40 mg; one cap daily(does not need)  Hydromorphone 4 mg; one tab three times a day PRN (will call when needed)  Vitamin D3 2000; one tab once a day OTC (will call when needed)   Patient is due for next adherence delivery on: 09/18/2020 She does not need refills at this time.  Confirmed delivery date of 09-18-2020, advised patient that pharmacy will contact them the morning of delivery.  Follow-Up:  Pharmacist Review   Maia Breslow, [place] Assistant [phone]

## 2020-08-20 DIAGNOSIS — I7300 Raynaud's syndrome without gangrene: Secondary | ICD-10-CM | POA: Diagnosis not present

## 2020-08-20 DIAGNOSIS — M359 Systemic involvement of connective tissue, unspecified: Secondary | ICD-10-CM | POA: Diagnosis not present

## 2020-08-20 DIAGNOSIS — M1990 Unspecified osteoarthritis, unspecified site: Secondary | ICD-10-CM | POA: Diagnosis not present

## 2020-08-20 DIAGNOSIS — R5383 Other fatigue: Secondary | ICD-10-CM | POA: Diagnosis not present

## 2020-08-20 DIAGNOSIS — M542 Cervicalgia: Secondary | ICD-10-CM | POA: Diagnosis not present

## 2020-08-20 DIAGNOSIS — K219 Gastro-esophageal reflux disease without esophagitis: Secondary | ICD-10-CM | POA: Diagnosis not present

## 2020-08-20 DIAGNOSIS — G9050 Complex regional pain syndrome I, unspecified: Secondary | ICD-10-CM | POA: Diagnosis not present

## 2020-08-20 DIAGNOSIS — M329 Systemic lupus erythematosus, unspecified: Secondary | ICD-10-CM | POA: Diagnosis not present

## 2020-08-20 DIAGNOSIS — M791 Myalgia, unspecified site: Secondary | ICD-10-CM | POA: Diagnosis not present

## 2020-08-20 DIAGNOSIS — R768 Other specified abnormal immunological findings in serum: Secondary | ICD-10-CM | POA: Diagnosis not present

## 2020-08-20 DIAGNOSIS — G8929 Other chronic pain: Secondary | ICD-10-CM | POA: Diagnosis not present

## 2020-09-08 ENCOUNTER — Other Ambulatory Visit: Payer: Self-pay | Admitting: Family Medicine

## 2020-09-08 DIAGNOSIS — J302 Other seasonal allergic rhinitis: Secondary | ICD-10-CM

## 2020-09-11 ENCOUNTER — Telehealth: Payer: Self-pay | Admitting: Pharmacist

## 2020-09-11 NOTE — Chronic Care Management (AMB) (Signed)
Chronic Care Management Pharmacy Assistant   Name: Jo [name]  MRN: [id number] DOB: May 31, 1961  Reason for Encounter: Medication Review  PCP : Caren Macadam, MD  Allergies:   Allergies  Allergen Reactions  . Bee Pollen Anaphylaxis  . Diazepam     sucidal depression  . Prednisone Nausea And Vomiting    Per pt can not take oral steroids   . Amoxicillin     Severe diarrhea At high dose   . Doxycycline     Pt does not remember reaction  . Erythromycin     n & v  . Morphine     n & v  . Propoxyphene N-Acetaminophen     n & V  . Sulfa Antibiotics     MIGRAINES  . Tetracyclines & Related     N & v  . Propoxyphene Other (See Comments)    Medications: Outpatient Encounter Medications as of 09/11/2020  Medication Sig Note  . azelastine (ASTELIN) 0.1 % nasal spray Place 1 spray into both nostrils 2 (two) times daily. Use in each nostril as directed   . busPIRone (BUSPAR) 15 MG tablet TAKE 1/2 TABLET TWICE A DAY   . cholecalciferol (VITAMIN D) 1000 UNITS tablet Take 2,000 Units by mouth daily.   . clindamycin (CLEOCIN T) 1 % lotion Apply 1 application topically 2 (two) times daily.    . Cranberry 200 MG CAPS Take 200 mg by mouth 2 (two) times daily. 09/25/2015: Received from: [facility]: Take by mouth.  . cyanocobalamin (,VITAMIN B-12,) 1000 MCG/ML injection INJECT 1 ML INTO THE MUSCLE ONCE FOR 1 DOSE alternating every 2 weeks x 2 and then monthly (Patient taking differently: Inject 1,000 mcg into the muscle as directed. )   . erythromycin with ethanol (EMGEL) 2 % gel Apply topically 2 (two) times daily.   Marland Kitchen estradiol (ESTRACE) 2 MG tablet TAKE 1 TABLET BY MOUTH EVERY DAY   . fexofenadine-pseudoephedrine (ALLEGRA-D 24) 180-240 MG 24 hr tablet Take 1 tablet by mouth at bedtime.   . fluticasone (CUTIVATE) 0.05 % cream Apply 1 application topically 2 (two) times daily as needed (rash).    . fluticasone (FLONASE) 50 MCG/ACT nasal spray Place 2 sprays into  both nostrils daily.   . folic acid (FOLVITE) 1 MG tablet Take 1 tablet (1 mg total) by mouth daily.   Marland Kitchen lamoTRIgine (LAMICTAL) 100 MG tablet Take 1 tablet (100 mg total) by mouth 2 (two) times daily. (Patient taking differently: Take 100 mg by mouth 2 (two) times daily. Take 1/2 tab PO in the morning and 1 tablet at HS)   . levothyroxine (SYNTHROID) 125 MCG tablet TAKE 1 TABLET BY MOUTH EVERY DAY   . medroxyPROGESTERone (PROVERA) 2.5 MG tablet TAKE 1 TABLET ALTERNATING WITH 2 TABLETS DAILY   . metFORMIN (GLUCOPHAGE) 500 MG tablet Take 2 tabs q am, 1 tab qhs.   . methadone (DOLOPHINE) 10 MG tablet Take 1-2 tablets (10-20 mg total) by mouth in the morning, at noon, and at bedtime. Patient reports taking, 2 tablets in the morning, 2 tablets around noon, and 1 tablet at bedtime.   . methotrexate (RHEUMATREX) 2.5 MG tablet Take 4 tablets (10 mg total) by mouth once a week.   . Metoclopramide HCl 10 MG TBDP Take 1 tablet (10 mg total) by mouth 4 (four) times daily.   . montelukast (SINGULAIR) 10 MG tablet TAKE 1 TABLET BY MOUTH EVERYDAY AT BEDTIME   . nystatin cream (MYCOSTATIN)  Apply 1 application topically 2 (two) times daily.   Earney Navy Bicarbonate (ZEGERID) 20-1100 MG CAPS capsule Take 1 capsule by mouth daily.   . ondansetron (ZOFRAN) 4 MG tablet Take 1 tablet (4 mg total) by mouth every 8 (eight) hours as needed for nausea.   . sertraline (ZOLOFT) 100 MG tablet TAKE 2 TABLETS BY MOUTH EVERY DAY   . sucralfate (CARAFATE) 1 g tablet Take 1 tablet (1 g total) by mouth 4 (four) times daily -  with meals and at bedtime. (Patient taking differently: Take 1 g by mouth 3 (three) times daily as needed (indigestion). )   . Syringe/Needle, Disp, (SYRINGE 3CC/25GX1-1/2") 25G X 1-1/2" 3 ML MISC Inject 1cc (1022mcg) B12 q 2-4 weeks as directed   . tiZANidine (ZANAFLEX) 4 MG capsule Take 3 capsules (12 mg total) by mouth at bedtime as needed for muscle spasms.   Marland Kitchen topiramate (TOPAMAX) 25 MG tablet TAKE  1 TABLET 5 TIMES A DAY   . triamcinolone cream (KENALOG) 0.1 %     Facility-Administered Encounter Medications as of 09/11/2020  Medication  . cyanocobalamin ((VITAMIN B-12)) injection 1,000 mcg    Current Diagnosis: Patient Active Problem List   Diagnosis Date Noted  . Depression, recurrent ([place]) 07/22/2020  . Systemic lupus erythematosus ([place]) 04/29/2020  . Iron deficiency 12/13/2019  . Reflex sympathetic dystrophy of lower extremity 06/12/2019  . Disorder of thyroid gland 06/12/2019  . Falls 09/25/2015  . Musculoskeletal neck pain 09/25/2015  . Headache 09/25/2015  . Memory changes 09/25/2015  . Blurry vision 09/25/2015  . Neck pain 09/25/2015  . Polycystic ovaries 07/11/2013  . Hypothyroidism 05/04/2013  . Complex regional pain syndrome I, unspecified 03/06/2013  . Extremity pain 03/06/2013  . Helicobacter pylori infection 12/22/2012  . History of colonic polyps 02/25/2009  . OBESITY 01/14/2008  . DEPRESSION/ANXIETY 01/14/2008  . Asthma 01/14/2008  . GERD 01/14/2008  . Irritable colon 01/14/2008    Goals Addressed   None    Reviewed chart for medication changes ahead of medication coordination call. No OVs, Consults, or hospital visits since last care coordination call/Pharmacist visit.  No medication changes indicated .  BP Readings from Last 3 Encounters:  07/17/20 117/70  05/15/20 110/84  12/27/19 105/66    Lab Results  Component Value Date   HGBA1C 5.6 11/15/2019     Patient obtains medications through Adherence Packaging  90 Days   Last adherence delivery included:  Marland Kitchen Methadone 10mg ; two tabs a breakfast, one tab at lunch, one tab at bedtime  . Levothyroxine 125 mcg; one tab before breakfast  . Topiramate 25 mg; one tab in the morning, four tabs at bedtime . Sertraline 10mg ; two tabs before breakfast . Buspirone 15 mg; one half tab before breakfast, one half tab at bedtime . Metformin 500 mg; two tabs before breakfast, one tab at  bedtime . Tizanidine 4 mg; four tabs at bedtime   . Lamotrigine 10mg ; one tab before breakfast, one tab at bedtime  . Montelukast 10mg ; one tab at bedtime . Medroxyprogesterone 2.5 mg; one tabs M,W,F two tabs T,TH,S before breakfast . Estradiol 2 mg; one tab at breakfast . Methotrexate Sodium 2.5 mg; four tabs on Fridays at bedtime . Folic Acid 1 mg; one tab before breakfast . Fluticasone nasal spray; two sprays in both nostrils daily  Patient is due for next adherence delivery on: 09-18-2020. Called patient and reviewed medications and coordinated delivery.  This delivery to include: Marland Kitchen Methadone 10mg ; two tabs a breakfast, one  tab at lunch, one tab at bedtime  . Levothyroxine 125 mcg; one tab before breakfast  . Topiramate 25 mg; one tab in the morning, four tabs at bedtime . Sertraline 10mg ; two tabs before breakfast . Buspirone 15 mg; one half tab before breakfast, one half tab at bedtime . Metformin 500 mg; two tabs before breakfast, one tab at bedtime . Tizanidine 4 mg; four tabs at bedtime   . Lamotrigine 10mg ; one tab before breakfast, one tab at bedtime  . Montelukast 10mg ; one tab at bedtime . Medroxyprogesterone 2.5 mg; one tabs M,W,F two tabs T,TH,S before breakfast . Estradiol 2 mg; one tab at breakfast . Methotrexate Sodium 2.5 mg; four tabs on Fridays at bedtime . Folic Acid 1 mg; one tab before breakfast . Fluticasone nasal spray; two sprays in both nostrils daily . Cyanocobalamin ((VITAMIN B-12)) injection 1,000 mcg     She needs refills for : . Methadone 10 mg; two tabs a breakfast, one tab at lunch, one tab at bedtime   Confirmed delivery date of 09/18/2020, advised patient that pharmacy will contact them the morning of delivery. Follow-Up:  Coordination of Enhanced Pharmacy Services   Amilia ([place]) Mare Ferrari, [place] Assistant [phone]

## 2020-09-12 NOTE — Progress Notes (Deleted)
Subjective:   Jo [name] is a 59 y.o. female who presents for an Initial Medicare Annual Wellness Visit.  Review of Systems    N/A        Objective:    There were no vitals filed for this visit. There is no height or weight on file to calculate BMI.  Advanced Directives 12/20/2019 12/11/2019 12/11/2019 10/29/2015  Does Patient Have a Medical Advance Directive? No No No No  Would patient like information on creating a medical advance directive? No - Patient declined No - Patient declined - Yes - Educational materials given    Current Medications (verified) Outpatient Encounter Medications as of 09/23/2020  Medication Sig  . azelastine (ASTELIN) 0.1 % nasal spray Place 1 spray into both nostrils 2 (two) times daily. Use in each nostril as directed  . busPIRone (BUSPAR) 15 MG tablet TAKE 1/2 TABLET TWICE A DAY  . cholecalciferol (VITAMIN D) 1000 UNITS tablet Take 2,000 Units by mouth daily.  . clindamycin (CLEOCIN T) 1 % lotion Apply 1 application topically 2 (two) times daily.   . Cranberry 200 MG CAPS Take 200 mg by mouth 2 (two) times daily.  . cyanocobalamin (,VITAMIN B-12,) 1000 MCG/ML injection INJECT 1 ML INTO THE MUSCLE ONCE FOR 1 DOSE alternating every 2 weeks x 2 and then monthly (Patient taking differently: Inject 1,000 mcg into the muscle as directed. )  . erythromycin with ethanol (EMGEL) 2 % gel Apply topically 2 (two) times daily.  Marland Kitchen estradiol (ESTRACE) 2 MG tablet TAKE 1 TABLET BY MOUTH EVERY DAY  . fexofenadine-pseudoephedrine (ALLEGRA-D 24) 180-240 MG 24 hr tablet Take 1 tablet by mouth at bedtime.  . fluticasone (CUTIVATE) 0.05 % cream Apply 1 application topically 2 (two) times daily as needed (rash).   . fluticasone (FLONASE) 50 MCG/ACT nasal spray Place 2 sprays into both nostrils daily.  . folic acid (FOLVITE) 1 MG tablet Take 1 tablet (1 mg total) by mouth daily.  Marland Kitchen lamoTRIgine (LAMICTAL) 100 MG tablet Take 1 tablet (100 mg total) by mouth 2 (two) times daily.  (Patient taking differently: Take 100 mg by mouth 2 (two) times daily. Take 1/2 tab PO in the morning and 1 tablet at HS)  . levothyroxine (SYNTHROID) 125 MCG tablet TAKE 1 TABLET BY MOUTH EVERY DAY  . medroxyPROGESTERone (PROVERA) 2.5 MG tablet TAKE 1 TABLET ALTERNATING WITH 2 TABLETS DAILY  . metFORMIN (GLUCOPHAGE) 500 MG tablet Take 2 tabs q am, 1 tab qhs.  . methadone (DOLOPHINE) 10 MG tablet Take 1-2 tablets (10-20 mg total) by mouth in the morning, at noon, and at bedtime. Patient reports taking, 2 tablets in the morning, 2 tablets around noon, and 1 tablet at bedtime.  . methotrexate (RHEUMATREX) 2.5 MG tablet Take 4 tablets (10 mg total) by mouth once a week.  . Metoclopramide HCl 10 MG TBDP Take 1 tablet (10 mg total) by mouth 4 (four) times daily.  . montelukast (SINGULAIR) 10 MG tablet TAKE 1 TABLET BY MOUTH EVERYDAY AT BEDTIME  . nystatin cream (MYCOSTATIN) Apply 1 application topically 2 (two) times daily.  Earney Navy Bicarbonate (ZEGERID) 20-1100 MG CAPS capsule Take 1 capsule by mouth daily.  . ondansetron (ZOFRAN) 4 MG tablet Take 1 tablet (4 mg total) by mouth every 8 (eight) hours as needed for nausea.  . sertraline (ZOLOFT) 100 MG tablet TAKE 2 TABLETS BY MOUTH EVERY DAY  . sucralfate (CARAFATE) 1 g tablet Take 1 tablet (1 g total) by mouth 4 (four) times  daily -  with meals and at bedtime. (Patient taking differently: Take 1 g by mouth 3 (three) times daily as needed (indigestion). )  . Syringe/Needle, Disp, (SYRINGE 3CC/25GX1-1/2") 25G X 1-1/2" 3 ML MISC Inject 1cc (1069mcg) B12 q 2-4 weeks as directed  . tiZANidine (ZANAFLEX) 4 MG capsule Take 3 capsules (12 mg total) by mouth at bedtime as needed for muscle spasms.  Marland Kitchen topiramate (TOPAMAX) 25 MG tablet TAKE 1 TABLET 5 TIMES A DAY  . triamcinolone cream (KENALOG) 0.1 %    Facility-Administered Encounter Medications as of 09/23/2020  Medication  . cyanocobalamin ((VITAMIN B-12)) injection 1,000 mcg    Allergies  (verified) Bee pollen, Diazepam, Prednisone, Amoxicillin, Doxycycline, Erythromycin, Morphine, Propoxyphene n-acetaminophen, Sulfa antibiotics, Tetracyclines & related, and Propoxyphene   History: Past Medical History:  Diagnosis Date  . Allergy   . Anemia   . Anxiety   . Anxiety and depression   . Arthritis   . Asthma   . Chronic headaches   . Colon polyp   . COPD (chronic obstructive pulmonary disease) (HCC)    no o2  . Depression   . Fundic gland polyps of stomach, benign   . Gastritis   . GERD (gastroesophageal reflux disease)   . H. pylori infection 2013  . H/O gastric ulcer 1985  . History of anorexia nervosa   . History of bulimia   . History of pneumonia   . Hypothyroidism   . Irritable bowel syndrome (IBS)   . Obesity   . Personal history of other mental and behavioral disorders 01/14/2008   Qualifier: Diagnosis of  By: Carlean Purl MD, [name]   Overview:  Overview:  Annotation: BULIMIA Qualifier: Diagnosis of  By: Bertram Gala   . REFLEX SYMPATHETIC DYSTROPHY 02/26/2009   Qualifier: Diagnosis of  By: Carlean Purl MD, Tonna Boehringer E   . RSD (reflex sympathetic dystrophy)   . RSD (reflex sympathetic dystrophy)   . Ulcer    gastric, duodenal ulcer   Past Surgical History:  Procedure Laterality Date  . CERVICAL SPINE SURGERY  1990  . CERVIX SURGERY    . CHOLECYSTECTOMY    . COLONOSCOPY    . KNEE ARTHROSCOPY  04/21/2012   right  . LAPAROSCOPIC NISSEN FUNDOPLICATION  [id number]   Dr. Johnathan Hausen  . lumbar spondylosis injected  2013   Dr. Mina Marble  . NASAL SINUS SURGERY    . THORACOSCOPY W/ THORACIC SYMPATHECTOMY     for RSD  . TONSILLECTOMY    . UPPER GASTROINTESTINAL ENDOSCOPY     Family History  Problem Relation Age of Onset  . Colon polyps Mother        part of intestines removed  . Raynaud syndrome Mother   . Irritable bowel syndrome Mother   . Other Mother        cold agluten  . Stroke Mother   . Colon polyps Brother   . Kidney Stones Brother    . Breast cancer Maternal Aunt   . Lung cancer Maternal Grandfather   . Colon cancer Neg Hx   . Esophageal cancer Neg Hx   . Rectal cancer Neg Hx   . Stomach cancer Neg Hx   . Migraines Neg Hx   . Seizures Neg Hx    Social History   Socioeconomic History  . Marital status: Divorced    Spouse name: Not on file  . Number of children: 0  . Years of education: 18  . Highest education level: Not on  file  Occupational History  . Occupation: Dance movement psychotherapist: UNEMPLOYED  Tobacco Use  . Smoking status: Never Smoker  . Smokeless tobacco: Never Used  Substance and Sexual Activity  . Alcohol use: No  . Drug use: No  . Sexual activity: Not on file  Other Topics Concern  . Not on file  Social History Narrative   Lives at home alone.   Caffeine use: Drinks coffee- 1 cup in morning   Ginger ale   Social Determinants of Health   Financial Resource Strain: Low Risk   . Difficulty of Paying Living Expenses: Not hard at all  Food Insecurity: Not on file  Transportation Needs: No Transportation Needs  . Lack of Transportation (Medical): No  . Lack of Transportation (Non-Medical): No  Physical Activity: Not on file  Stress: Not on file  Social Connections: Not on file    Tobacco Counseling Counseling given: Not Answered   Clinical Intake:                 Diabetic?No          Activities of Daily Living No flowsheet data found.  Patient Care Team: Caren Macadam, MD as PCP - General (Family Medicine) Leone Payor, MD as Referring Physician (Anesthesiology) Gatha Mayer, MD as Consulting Physician (Gastroenterology) Magrinat, Virgie Dad, MD as Consulting Physician (Oncology) [name], MD as Consulting Physician (Rheumatology) Jacelyn Pi, MD as Referring Physician (Endocrinology) Viona Gilmore, [facility] as Pharmacist (Pharmacist)  Indicate any recent Medical Services you may have received from other than [facility] providers in  the past year (date may be approximate).     Assessment:   This is a routine wellness examination for Jo [name].  Hearing/Vision screen No exam data present  Dietary issues and exercise activities discussed:    Goals    . Pharmacy Care Plan     CARE PLAN ENTRY  Current Barriers:  . Chronic Disease Management support, education, and care coordination needs related to  Asthma, hypothyroidism, iron deficiency, PCOS, folate deficiency, systemic lupus, GERD, nausea, GI spasms, chronic regional pain syndrome, menopausal symptoms, depression/ anxiety, allergic rhinitis, vitamin B12 deficiency  Current Barriers:  . Polypharmacy; complex patient with multiple comorbidities  . Self-manages medications by organizing medications in empty pill bottles.  Does not use a pill box or other adherence strategies.   Uncontrolled asthma, treatment options limited by: not tolerating previous nebulizers and inhalers  . Current regimen: montelukast 10 mg, 1 tablet at bedtime Hypothyroidism . Current regimen: levothyroxine 124mcg, 1 tablet once daily  Iron deficiency:  . Current regimen: Ferumoxytol (FERAHEME) infusion  . Patient to return in 1 month after blood work.  PCOS . Current regimen: metformin 500mg , 2 tablets every morning and 1 tablet at bedtime Folate deficiency . Current regimen: folic acid 1 mg, 1 tablet once daily  Systemic lupus . Current regimen:  methotrexate 2.5mg , 4 tablets at bedtime Acid reflux  Current regimen: Zegerid 20-1100mg , 1 capsule once daily    sucralfate 1g, 1 tablet four times daily with meals and at bedtime  Nausea/ spasms Current regimen:   Metoclopramide 10mg , 1 tablet four times daily (patient has been using as needed)  ondansetron (Zofran) 4mg , 1 tablet every eight hours as needed for nausea Complex regional pain syndrome Current regimen:   hydromorphone 4mg , 1 tablet every 4 hours as needed for moderate pain or severe pain   methadone 10mg , (Patient  reports taking: 2 tablet in the morning, 2 tablets around  noon, and 1 tab at bedtime).  topiramate 25mg , 1 tablet in the morning and 3 tablets at bedtime   tizanidine 4mg , 3 capsules at bedtime as needed for muscle spasms  Menopausal symptoms Current regimen:  . estradiol 2mg , 1 tablet once daily  . medroxyprogesterone 2.5mg , 1 tablet alternating with 2 tablets once daily  Depression/anxiety Current regimen:  . buspirone (Buspar) 15mg , 0.5 tablet (7.5mg ) twice daily  . sertraline 100mg , 2 tablets once daily . lamotrigine 100mg ,1/2 tablet in AM and 1 tablet in PM  Allergic rhinitis Current regimen:  . azelastine (Astelin) 0.1% nasal spray, 1 spray into both nostrils twice daily  . Allegra, 180mg , 1 tablet at bedtime . fluticasone (Flonase) 8mcg/act nasal spray, 2 sprays into each nostril every day  Vitamin B12 deficiency . Current regimen: cyanocobalamin 1000 mcg/ml, inject 1 ML into muscle every 2 weeks for 2 doses and then once every month   Pharmacist Clinical Goal(s):  Marland Kitchen Over the next 120 days, patient will work with PharmD and provider towards optimized medication management.  . Patient to find and make appointment with a new gynecologist. Appropriate time to review medications for menopause.   Interventions: . Comprehensive medication review performed; medication list updated in electronic medical record . Inter-disciplinary care team collaboration . Instructed patient to continue all medications as instructed.  . Discussed non-pharmacological interventions for acid reflux. Take measures to prevent acid reflux, such as avoiding spicy foods, avoiding caffeine, avoid laying down a few hours after eating, and raising the head of the bed. . Discussed consistent intake of levothyroxine in regards to meals and other medications.   Patient Self Care Activities:  . Patient will take medications as prescribed   Please see past updates related to this goal by clicking on the "Past  Updates" button in the selected goal        Depression Screen PHQ 2/9 Scores 12/03/2019 07/12/2019  PHQ - 2 Score 3 1  PHQ- 9 Score 12 1    Fall Risk No flowsheet data found.  FALL RISK PREVENTION PERTAINING TO THE HOME:  Any stairs in or around the home? {YES/NO:21197} If so, are there any without handrails? No  Home free of loose throw rugs in walkways, pet beds, electrical cords, etc? Yes  Adequate lighting in your home to reduce risk of falls? Yes   ASSISTIVE DEVICES UTILIZED TO PREVENT FALLS:  Life alert? {YES/NO:21197} Use of a cane, walker or w/c? {YES/NO:21197} Grab bars in the bathroom? {YES/NO:21197} Shower chair or bench in shower? {YES/NO:21197} Elevated toilet seat or a handicapped toilet? {YES/NO:21197}    Cognitive Function:        Immunizations Immunization History  Administered Date(s) Administered  . Influenza, Seasonal, Injecte, Preservative Fre 06/21/2014, 06/26/2015, 07/29/2016  . Influenza,inj,Quad PF,6+ Mos 07/15/2017, 06/15/2018, 06/13/2019  . Influenza,inj,quad, With Preservative 07/11/2014, 07/19/2017  . Influenza,trivalent, recombinat, inj, PF 06/14/2013  . Influenza-Unspecified 06/18/2020  . PFIZER SARS-COV-2 Vaccination 01/20/2020, 02/18/2020  . Pneumococcal Polysaccharide-23 10/09/2015  . Tdap 10/23/2007    TDAP status: Due, Education has been provided regarding the importance of this vaccine. Advised may receive this vaccine at local pharmacy or Health Dept. Aware to provide a copy of the vaccination record if obtained from local pharmacy or Health Dept. Verbalized acceptance and understanding.  Flu Vaccine status: Up to date  Pneumococcal vaccine status: Up to date  Covid-19 vaccine status: Completed vaccines  Qualifies for Shingles Vaccine? Yes   Zostavax completed No   Shingrix Completed?: No.    Education  has been provided regarding the importance of this vaccine. Patient has been advised to call insurance company to determine  out of pocket expense if they have not yet received this vaccine. Advised may also receive vaccine at local pharmacy or Health Dept. Verbalized acceptance and understanding.  Screening Tests Health Maintenance  Topic Date Due  . TETANUS/TDAP  10/22/2017  . MAMMOGRAM  06/15/2020  . COVID-19 Vaccine (3 - Booster for Pfizer series) 08/20/2020  . PAP SMEAR-Modifier  10/27/2020 (Originally 08/19/1984)  . OPHTHALMOLOGY EXAM  10/27/2020 (Originally 08/19/1973)  . HIV Screening  04/26/2021 (Originally 08/19/1978)  . COLONOSCOPY  08/31/2022  . INFLUENZA VACCINE  Completed  . PNEUMOCOCCAL POLYSACCHARIDE VACCINE AGE 51-64 HIGH RISK  Completed  . Hepatitis C Screening  Completed  . FOOT EXAM  Discontinued  . HEMOGLOBIN A1C  Discontinued  . URINE MICROALBUMIN  Discontinued    Health Maintenance  Health Maintenance Due  Topic Date Due  . TETANUS/TDAP  10/22/2017  . MAMMOGRAM  06/15/2020  . COVID-19 Vaccine (3 - Booster for Pfizer series) 08/20/2020    Colorectal cancer screening: Type of screening: Colonoscopy. Completed 08/31/2012. Repeat every 10 years  Mammogram status: Ordered 09/23/2020. Pt provided with contact info and advised to call to schedule appt.   Bone Density Status: Not required at this age  Lung Cancer Screening: (Low Dose CT Chest recommended if Age 83-80 years, 30 pack-year currently smoking OR have quit w/in 15years.) does not qualify.   Lung Cancer Screening Referral: N/A   Additional Screening:  Hepatitis C Screening: does qualify; Completed 07/30/2020   Vision Screening: Recommended annual ophthalmology exams for early detection of glaucoma and other disorders of the eye. Is the patient up to date with their annual eye exam?  {YES/NO:21197} Who is the provider or what is the name of the office in which the patient attends annual eye exams? *** If pt is not established with a provider, would they like to be referred to a provider to establish care?  {YES/NO:21197}.   Dental Screening: Recommended annual dental exams for proper oral hygiene  Community Resource Referral / Chronic Care Management: CRR required this visit?  No   CCM required this visit?  No      Plan:     I have personally reviewed and noted the following in the patient's chart:   . Medical and social history . Use of alcohol, tobacco or illicit drugs  . Current medications and supplements . Functional ability and status . Nutritional status . Physical activity . Advanced directives . List of other physicians . Hospitalizations, surgeries, and ER visits in previous 12 months . Vitals . Screenings to include cognitive, depression, and falls . Referrals and appointments  In addition, I have reviewed and discussed with patient certain preventive protocols, quality metrics, and best practice recommendations. A written personalized care plan for preventive services as well as general preventive health recommendations were provided to patient.     Ofilia Neas, LPN   [id number]   Nurse Notes: None

## 2020-09-17 ENCOUNTER — Telehealth: Payer: Self-pay | Admitting: Pharmacist

## 2020-09-17 DIAGNOSIS — Z79891 Long term (current) use of opiate analgesic: Secondary | ICD-10-CM | POA: Diagnosis not present

## 2020-09-17 DIAGNOSIS — M542 Cervicalgia: Secondary | ICD-10-CM | POA: Diagnosis not present

## 2020-09-17 DIAGNOSIS — G90512 Complex regional pain syndrome I of left upper limb: Secondary | ICD-10-CM | POA: Diagnosis not present

## 2020-09-17 DIAGNOSIS — G894 Chronic pain syndrome: Secondary | ICD-10-CM | POA: Diagnosis not present

## 2020-09-17 NOTE — Progress Notes (Signed)
Called the patient to verify that she is still wanting Nucynta 50mg  to be included in her delivery for 09/18/2020. The patient did confirm that she does want the medication to be included the medication will be in a vial container and not in her medication packs due to the sig. The patient voiced an understanding.   09/20/2020, [facility]  Practice Team Manager/ CPA (Clinical Pharmacist Assistant) [phone]

## 2020-09-17 NOTE — Progress Notes (Signed)
Called Dr. Deanne Coffer office at [phone] to confirm the dose change of the patients folic acid 1gm from QD to BID. I spoke with the front desk they were unable to confirm the dose change so I left a message requesting ]for the nurse to return my call to confirm. And to request a new Rx for the folic Acid with corrected SIG.   Fredric Mare, [facility]  Practice Team Manager/ CPA (Clinical Pharmacist Assistant) [phone]

## 2020-09-23 ENCOUNTER — Ambulatory Visit: Payer: PPO

## 2020-10-09 ENCOUNTER — Ambulatory Visit (INDEPENDENT_AMBULATORY_CARE_PROVIDER_SITE_OTHER): Payer: PPO

## 2020-10-09 DIAGNOSIS — Z1231 Encounter for screening mammogram for malignant neoplasm of breast: Secondary | ICD-10-CM | POA: Diagnosis not present

## 2020-10-09 DIAGNOSIS — Z0000 Encounter for general adult medical examination without abnormal findings: Secondary | ICD-10-CM

## 2020-10-09 NOTE — Progress Notes (Signed)
Subjective:   Jo [name] is a 60 y.o. female who presents for an Initial Medicare Annual Wellness Visit.  I connected with Leta Jungling today by telephone and verified that I am speaking with the correct person using two identifiers. Location patient: home Location provider: work Persons participating in the virtual visit: patient, provider.   I discussed the limitations, risks, security and privacy concerns of performing an evaluation and management service by telephone and the availability of in person appointments. I also discussed with the patient that there may be a patient responsible charge related to this service. The patient expressed understanding and verbally consented to this telephonic visit.    Interactive audio and video telecommunications were attempted between this provider and patient, however failed, due to patient having technical difficulties OR patient did not have access to video capability.  We continued and completed visit with audio only.      Review of Systems    N/A Cardiac Risk Factors include: advanced age (>3men, >25 women)     Objective:    Today's Vitals   10/09/20 1452  PainSc: 6    There is no height or weight on file to calculate BMI.  Advanced Directives 10/09/2020 12/20/2019 12/11/2019 12/11/2019 10/29/2015  Does Patient Have a Medical Advance Directive? No No No No No  Would patient like information on creating a medical advance directive? No - Patient declined No - Patient declined No - Patient declined - Yes - Educational materials given    Current Medications (verified) Outpatient Encounter Medications as of 10/09/2020  Medication Sig  . azelastine (ASTELIN) 0.1 % nasal spray Place 1 spray into both nostrils 2 (two) times daily. Use in each nostril as directed  . Belimumab (BENLYSTA) 200 MG/ML SOSY 1 ml  . busPIRone (BUSPAR) 15 MG tablet TAKE 1/2 TABLET TWICE A DAY  . cholecalciferol (VITAMIN D) 1000 UNITS tablet Take 2,000 Units by  mouth daily.  . clindamycin (CLEOCIN T) 1 % lotion Apply 1 application topically 2 (two) times daily.   . Cranberry 200 MG CAPS Take 200 mg by mouth 2 (two) times daily.  . cyanocobalamin (,VITAMIN B-12,) 1000 MCG/ML injection INJECT 1 ML INTO THE MUSCLE ONCE FOR 1 DOSE alternating every 2 weeks x 2 and then monthly (Patient taking differently: Inject 1,000 mcg into the muscle as directed.)  . erythromycin with ethanol (EMGEL) 2 % gel Apply topically 2 (two) times daily.  Marland Kitchen estradiol (ESTRACE) 2 MG tablet TAKE 1 TABLET BY MOUTH EVERY DAY  . fexofenadine-pseudoephedrine (ALLEGRA-D 24) 180-240 MG 24 hr tablet Take 1 tablet by mouth at bedtime.  . fluticasone (CUTIVATE) 0.05 % cream Apply 1 application topically 2 (two) times daily as needed (rash).   . fluticasone (FLONASE) 50 MCG/ACT nasal spray Place 2 sprays into both nostrils daily.  . folic acid (FOLVITE) 1 MG tablet Take 1 tablet (1 mg total) by mouth daily.  Marland Kitchen lamoTRIgine (LAMICTAL) 100 MG tablet Take 1 tablet (100 mg total) by mouth 2 (two) times daily. (Patient taking differently: Take 100 mg by mouth 2 (two) times daily. Take 1/2 tab PO in the morning and 1 tablet at HS)  . levothyroxine (SYNTHROID) 125 MCG tablet TAKE 1 TABLET BY MOUTH EVERY DAY  . medroxyPROGESTERone (PROVERA) 2.5 MG tablet TAKE 1 TABLET ALTERNATING WITH 2 TABLETS DAILY  . metFORMIN (GLUCOPHAGE) 500 MG tablet Take 2 tabs q am, 1 tab qhs.  . methadone (DOLOPHINE) 10 MG tablet Take 1-2 tablets (10-20 mg total) by  mouth in the morning, at noon, and at bedtime. Patient reports taking, 2 tablets in the morning, 2 tablets around noon, and 1 tablet at bedtime.  . methotrexate (RHEUMATREX) 2.5 MG tablet Take 4 tablets (10 mg total) by mouth once a week.  . Metoclopramide HCl 10 MG TBDP Take 1 tablet (10 mg total) by mouth 4 (four) times daily.  . montelukast (SINGULAIR) 10 MG tablet TAKE 1 TABLET BY MOUTH EVERYDAY AT BEDTIME  . nystatin cream (MYCOSTATIN) Apply 1 application  topically 2 (two) times daily.  Earney Navy Bicarbonate (ZEGERID) 20-1100 MG CAPS capsule Take 1 capsule by mouth daily.  . ondansetron (ZOFRAN) 4 MG tablet Take 1 tablet (4 mg total) by mouth every 8 (eight) hours as needed for nausea.  . sertraline (ZOLOFT) 100 MG tablet TAKE 2 TABLETS BY MOUTH EVERY DAY  . sucralfate (CARAFATE) 1 g tablet Take 1 tablet (1 g total) by mouth 4 (four) times daily -  with meals and at bedtime. (Patient taking differently: Take 1 g by mouth 3 (three) times daily as needed (indigestion).)  . Syringe/Needle, Disp, (SYRINGE 3CC/25GX1-1/2") 25G X 1-1/2" 3 ML MISC Inject 1cc (1047mcg) B12 q 2-4 weeks as directed  . tiZANidine (ZANAFLEX) 4 MG capsule Take 3 capsules (12 mg total) by mouth at bedtime as needed for muscle spasms.  Marland Kitchen topiramate (TOPAMAX) 25 MG tablet TAKE 1 TABLET 5 TIMES A DAY  . triamcinolone cream (KENALOG) 0.1 %    Facility-Administered Encounter Medications as of 10/09/2020  Medication  . cyanocobalamin ((VITAMIN B-12)) injection 1,000 mcg    Allergies (verified) Bee pollen, Diazepam, Prednisone, Amoxicillin, Doxycycline, Erythromycin, Morphine, Propoxyphene n-acetaminophen, Sulfa antibiotics, Tetracyclines & related, and Propoxyphene   History: Past Medical History:  Diagnosis Date  . Allergy   . Anemia   . Anxiety   . Anxiety and depression   . Arthritis   . Asthma   . Chronic headaches   . Colon polyp   . COPD (chronic obstructive pulmonary disease) (HCC)    no o2  . Depression   . Fundic gland polyps of stomach, benign   . Gastritis   . GERD (gastroesophageal reflux disease)   . H. pylori infection 2013  . H/O gastric ulcer 1985  . History of anorexia nervosa   . History of bulimia   . History of pneumonia   . Hypothyroidism   . Irritable bowel syndrome (IBS)   . Obesity   . Personal history of other mental and behavioral disorders 01/14/2008   Qualifier: Diagnosis of  By: Carlean Purl MD, [name]   Overview:   Overview:  Annotation: BULIMIA Qualifier: Diagnosis of  By: Bertram Gala   . REFLEX SYMPATHETIC DYSTROPHY 02/26/2009   Qualifier: Diagnosis of  By: Carlean Purl MD, Tonna Boehringer E   . RSD (reflex sympathetic dystrophy)   . RSD (reflex sympathetic dystrophy)   . Ulcer    gastric, duodenal ulcer   Past Surgical History:  Procedure Laterality Date  . CERVICAL SPINE SURGERY  1990  . CERVIX SURGERY    . CHOLECYSTECTOMY    . COLONOSCOPY    . KNEE ARTHROSCOPY  04/21/2012   right  . LAPAROSCOPIC NISSEN FUNDOPLICATION  [id number]   Dr. Johnathan Hausen  . lumbar spondylosis injected  2013   Dr. Mina Marble  . NASAL SINUS SURGERY    . THORACOSCOPY W/ THORACIC SYMPATHECTOMY     for RSD  . TONSILLECTOMY    . UPPER GASTROINTESTINAL ENDOSCOPY     Family  History  Problem Relation Age of Onset  . Colon polyps Mother        part of intestines removed  . Raynaud syndrome Mother   . Irritable bowel syndrome Mother   . Other Mother        cold agluten  . Stroke Mother   . Colon polyps Brother   . Kidney Stones Brother   . Breast cancer Maternal Aunt   . Lung cancer Maternal Grandfather   . Colon cancer Neg Hx   . Esophageal cancer Neg Hx   . Rectal cancer Neg Hx   . Stomach cancer Neg Hx   . Migraines Neg Hx   . Seizures Neg Hx    Social History   Socioeconomic History  . Marital status: Divorced    Spouse name: Not on file  . Number of children: 0  . Years of education: 40  . Highest education level: Not on file  Occupational History  . Occupation: Dance movement psychotherapist: UNEMPLOYED  Tobacco Use  . Smoking status: Never Smoker  . Smokeless tobacco: Never Used  Substance and Sexual Activity  . Alcohol use: No  . Drug use: No  . Sexual activity: Not on file  Other Topics Concern  . Not on file  Social History Narrative   Lives at home alone.   Caffeine use: Drinks coffee- 1 cup in morning   Ginger ale   Social Determinants of Health   Financial Resource  Strain: Low Risk   . Difficulty of Paying Living Expenses: Not hard at all  Food Insecurity: No Food Insecurity  . Worried About Charity fundraiser in the Last Year: Never true  . Ran Out of Food in the Last Year: Never true  Transportation Needs: No Transportation Needs  . Lack of Transportation (Medical): No  . Lack of Transportation (Non-Medical): No  Physical Activity: Sufficiently Active  . Days of Exercise per Week: 6 days  . Minutes of Exercise per Session: 30 min  Stress: No Stress Concern Present  . Feeling of Stress : Not at all  Social Connections: Socially Isolated  . Frequency of Communication with Friends and Family: More than three times a week  . Frequency of Social Gatherings with Friends and Family: Once a week  . Attends Religious Services: Never  . Active Member of Clubs or Organizations: No  . Attends Archivist Meetings: Never  . Marital Status: Divorced    Tobacco Counseling Counseling given: Not Answered   Clinical Intake:  Pre-visit preparation completed: Yes  Pain : 0-10 Pain Score: 6  Pain Type: Chronic pain Pain Location: Leg (Arm, shoulder) Pain Orientation: Right,Left Pain Onset: More than a month ago Pain Frequency: Constant Pain Relieving Factors: Heat  Pain Relieving Factors: Heat  Nutritional Risks: Nausea/ vomitting/ diarrhea (Nausea) Diabetes: No  How often do you need to have someone help you when you read instructions, pamphlets, or other written materials from your doctor or pharmacy?: 1 - Never What is the last grade level you completed in school?: College  Diabetic? No   Interpreter Needed?: No  Information entered by :: [place] of Daily Living In your present state of health, do you have any difficulty performing the following activities: 10/09/2020  Hearing? N  Vision? Y  Difficulty concentrating or making decisions? Y  Walking or climbing stairs? Y  Dressing or bathing? N  Doing errands,  shopping? N  Preparing Food and eating ? N  Using the Toilet? N  In the past six months, have you accidently leaked urine? Y  Comment has occassional nocturnal incontinence  Do you have problems with loss of bowel control? N  Managing your Medications? N  Managing your Finances? N  Housekeeping or managing your Housekeeping? N  Some recent data might be hidden    Patient Care Team: Caren Macadam, MD as PCP - General (Family Medicine) Leone Payor, MD as Referring Physician (Anesthesiology) Gatha Mayer, MD as Consulting Physician (Gastroenterology) Magrinat, Virgie Dad, MD as Consulting Physician (Oncology) [name], MD as Consulting Physician (Rheumatology) Jacelyn Pi, MD as Referring Physician (Endocrinology) Viona Gilmore, [facility] as Pharmacist (Pharmacist)  Indicate any recent Medical Services you may have received from other than [facility] providers in the past year (date may be approximate).     Assessment:   This is a routine wellness examination for Trecie.  Hearing/Vision screen  Hearing Screening   125Hz  250Hz  500Hz  1000Hz  2000Hz  3000Hz  4000Hz  6000Hz  8000Hz   Right ear:           Left ear:           Vision Screening Comments: Patient states has not had any eye exam in a while    Dietary issues and exercise activities discussed: Current Exercise Habits: Home exercise routine, Type of exercise: walking, Time (Minutes): 30, Frequency (Times/Week): 6, Weekly Exercise (Minutes/Week): 180, Intensity: Mild  Goals    . Patient Stated     I will continue to walk daily     . Pharmacy Care Plan     CARE PLAN ENTRY  Current Barriers:  . Chronic Disease Management support, education, and care coordination needs related to  Asthma, hypothyroidism, iron deficiency, PCOS, folate deficiency, systemic lupus, GERD, nausea, GI spasms, chronic regional pain syndrome, menopausal symptoms, depression/ anxiety, allergic rhinitis, vitamin B12 deficiency  Current Barriers:   . Polypharmacy; complex patient with multiple comorbidities  . Self-manages medications by organizing medications in empty pill bottles.  Does not use a pill box or other adherence strategies.   Uncontrolled asthma, treatment options limited by: not tolerating previous nebulizers and inhalers  . Current regimen: montelukast 10 mg, 1 tablet at bedtime Hypothyroidism . Current regimen: levothyroxine 13mcg, 1 tablet once daily  Iron deficiency:  . Current regimen: Ferumoxytol (FERAHEME) infusion  . Patient to return in 1 month after blood work.  PCOS . Current regimen: metformin 500mg , 2 tablets every morning and 1 tablet at bedtime Folate deficiency . Current regimen: folic acid 1 mg, 1 tablet once daily  Systemic lupus . Current regimen:  methotrexate 2.5mg , 4 tablets at bedtime Acid reflux  Current regimen: Zegerid 20-1100mg , 1 capsule once daily    sucralfate 1g, 1 tablet four times daily with meals and at bedtime  Nausea/ spasms Current regimen:   Metoclopramide 10mg , 1 tablet four times daily (patient has been using as needed)  ondansetron (Zofran) 4mg , 1 tablet every eight hours as needed for nausea Complex regional pain syndrome Current regimen:   hydromorphone 4mg , 1 tablet every 4 hours as needed for moderate pain or severe pain   methadone 10mg , (Patient reports taking: 2 tablet in the morning, 2 tablets around noon, and 1 tab at bedtime).  topiramate 25mg , 1 tablet in the morning and 3 tablets at bedtime   tizanidine 4mg , 3 capsules at bedtime as needed for muscle spasms  Menopausal symptoms Current regimen:  . estradiol 2mg , 1 tablet once daily  . medroxyprogesterone 2.5mg , 1 tablet alternating with  2 tablets once daily  Depression/anxiety Current regimen:  . buspirone (Buspar) 15mg , 0.5 tablet (7.5mg ) twice daily  . sertraline 100mg , 2 tablets once daily . lamotrigine 100mg ,1/2 tablet in AM and 1 tablet in PM  Allergic rhinitis Current regimen:   . azelastine (Astelin) 0.1% nasal spray, 1 spray into both nostrils twice daily  . Allegra, 180mg , 1 tablet at bedtime . fluticasone (Flonase) 48mcg/act nasal spray, 2 sprays into each nostril every day  Vitamin B12 deficiency . Current regimen: cyanocobalamin 1000 mcg/ml, inject 1 ML into muscle every 2 weeks for 2 doses and then once every month   Pharmacist Clinical Goal(s):  Marland Kitchen Over the next 120 days, patient will work with PharmD and provider towards optimized medication management.  . Patient to find and make appointment with a new gynecologist. Appropriate time to review medications for menopause.   Interventions: . Comprehensive medication review performed; medication list updated in electronic medical record . Inter-disciplinary care team collaboration . Instructed patient to continue all medications as instructed.  . Discussed non-pharmacological interventions for acid reflux. Take measures to prevent acid reflux, such as avoiding spicy foods, avoiding caffeine, avoid laying down a few hours after eating, and raising the head of the bed. . Discussed consistent intake of levothyroxine in regards to meals and other medications.   Patient Self Care Activities:  . Patient will take medications as prescribed   Please see past updates related to this goal by clicking on the "Past Updates" button in the selected goal        Depression Screen PHQ 2/9 Scores 10/09/2020 12/03/2019 07/12/2019  PHQ - 2 Score 0 3 1  PHQ- 9 Score 0 12 1    Fall Risk Fall Risk  10/09/2020  Falls in the past year? 1  Number falls in past yr: 1  Injury with Fall? 1  Risk for fall due to : Impaired balance/gait;Medication side effect;History of fall(s)  Follow up Falls evaluation completed;Falls prevention discussed    FALL RISK PREVENTION PERTAINING TO THE HOME:  Any stairs in or around the home? No  If so, are there any without handrails? No  Home free of loose throw rugs in walkways, pet beds,  electrical cords, etc? Yes  Adequate lighting in your home to reduce risk of falls? Yes   ASSISTIVE DEVICES UTILIZED TO PREVENT FALLS:  Life alert? No  Use of a cane, walker or w/c? Yes  Grab bars in the bathroom? Yes  Shower chair or bench in shower? Yes  Elevated toilet seat or a handicapped toilet? No     Cognitive Function:     6CIT Screen 10/09/2020  What time? 0 points  Count back from 20 0 points  Months in reverse 0 points  Repeat phrase 0 points    Immunizations Immunization History  Administered Date(s) Administered  . Influenza, Quadrivalent, Recombinant, Inj, Pf 06/18/2020  . Influenza, Seasonal, Injecte, Preservative Fre 06/21/2014, 06/26/2015, 07/29/2016  . Influenza,inj,Quad PF,6+ Mos 07/15/2017, 06/15/2018, 06/13/2019  . Influenza,inj,quad, With Preservative 07/11/2014, 07/19/2017  . Influenza,trivalent, recombinat, inj, PF 06/14/2013  . Influenza-Unspecified 06/18/2020  . PFIZER(Purple Top)SARS-COV-2 Vaccination 01/20/2020, 01/29/2020, 02/18/2020  . Pneumococcal Polysaccharide-23 10/09/2015  . Tdap 10/23/2007    TDAP status: Due, Education has been provided regarding the importance of this vaccine. Advised may receive this vaccine at local pharmacy or Health Dept. Aware to provide a copy of the vaccination record if obtained from local pharmacy or Health Dept. Verbalized acceptance and understanding.  Flu Vaccine status: Up to  date  Pneumococcal vaccine status: Up to date  Covid-19 vaccine status: Completed vaccines  Qualifies for Shingles Vaccine? Yes   Zostavax completed No   Shingrix Completed?: No.    Education has been provided regarding the importance of this vaccine. Patient has been advised to call insurance company to determine out of pocket expense if they have not yet received this vaccine. Advised may also receive vaccine at local pharmacy or Health Dept. Verbalized acceptance and understanding.  Screening Tests Health Maintenance  Topic  Date Due  . TETANUS/TDAP  10/22/2017  . MAMMOGRAM  06/15/2020  . COVID-19 Vaccine (3 - Booster for Pfizer series) 08/20/2020  . PAP SMEAR-Modifier  10/27/2020 (Originally 08/19/1984)  . OPHTHALMOLOGY EXAM  10/27/2020 (Originally [birth year]19[birth year]1973)  . HIV Screening  04/26/2021 (Originally 08/19/1978)  . COLONOSCOPY (Pts 45-24yrs Insurance coverage will need to be confirmed)  08/31/2022  . INFLUENZA VACCINE  Completed  . PNEUMOCOCCAL POLYSACCHARIDE VACCINE AGE 25-64 HIGH RISK  Completed  . Hepatitis C Screening  Completed  . FOOT EXAM  Discontinued  . HEMOGLOBIN A1C  Discontinued  . URINE MICROALBUMIN  Discontinued    Health Maintenance  Health Maintenance Due  Topic Date Due  . TETANUS/TDAP  10/22/2017  . MAMMOGRAM  06/15/2020  . COVID-19 Vaccine (3 - Booster for Pfizer series) 08/20/2020    Colorectal cancer screening: Type of screening: Colonoscopy. Completed 08/31/2012. Repeat every 10 years  Mammogram status: Ordered 10/09/2020. Pt provided with contact info and advised to call to schedule appt.   Bone Density Status: Not required at this age   Lung Cancer Screening: (Low Dose CT Chest recommended if Age 7-80 years, 30 pack-year currently smoking OR have quit w/in 15years.) does not qualify.   Lung Cancer Screening Referral: N/A   Additional Screening:  Hepatitis C Screening: does qualify; Completed 07/30/2020  Vision Screening: Recommended annual ophthalmology exams for early detection of glaucoma and other disorders of the eye. Is the patient up to date with their annual eye exam?  No  Who is the provider or what is the name of the office in which the patient attends annual eye exams? Patient states does not have an eye doctor  If pt is not established with a provider, would they like to be referred to a provider to establish care? No .   Dental Screening: Recommended annual dental exams for proper oral hygiene  Community Resource Referral / Chronic Care Management: CRR  required this visit?  No   CCM required this visit?  No      Plan:     I have personally reviewed and noted the following in the patient's chart:   . Medical and social history . Use of alcohol, tobacco or illicit drugs  . Current medications and supplements . Functional ability and status . Nutritional status . Physical activity . Advanced directives . List of other physicians . Hospitalizations, surgeries, and ER visits in previous 12 months . Vitals . Screenings to include cognitive, depression, and falls . Referrals and appointments  In addition, I have reviewed and discussed with patient certain preventive protocols, quality metrics, and best practice recommendations. A written personalized care plan for preventive services as well as general preventive health recommendations were provided to patient.     Ofilia Neas, LPN   [id number]   Nurse Notes: None

## 2020-10-09 NOTE — Patient Instructions (Signed)
Jo [name] , Thank you for taking time to come for your Medicare Wellness Visit. I appreciate your ongoing commitment to your health goals. Please review the following plan we discussed and let me know if I can assist you in the future.   Screening recommendations/referrals: Colonoscopy: Up to date, next due 08/31/2022 Mammogram: Currently due orders placed this visit  Bone Density: Not currently due at this age  Recommended yearly ophthalmology/optometry visit for glaucoma screening and checkup Recommended yearly dental visit for hygiene and checkup  Vaccinations: Influenza vaccine: Up to date, next due fall 2022  Pneumococcal vaccine: Up to date, next due at age 89 Tdap vaccine: Currently due, you may contact your insurance company to discuss cost of tdap, or you may await and injury to receive  Shingles vaccine: Currently due for Shingrix, if you wish to receive we recommend that you do so at your local pharmacy.     Advanced directives: Advance directive discussed with you today. Even though you declined this today please call our office should you change your mind and we can give you the proper paperwork for you to fill out.   Conditions/risks identified: None   Next appointment: 10/10/2020 @ 12:30 PM with the Pharmacist  Preventive Care 40-64 Years, Female Preventive care refers to lifestyle choices and visits with your health care provider that can promote health and wellness. What does preventive care include?  A yearly physical exam. This is also called an annual well check.  Dental exams once or twice a year.  Routine eye exams. Ask your health care provider how often you should have your eyes checked.  Personal lifestyle choices, including:  Daily care of your teeth and gums.  Regular physical activity.  Eating a healthy diet.  Avoiding tobacco and drug use.  Limiting alcohol use.  Practicing safe sex.  Taking low-dose aspirin daily starting at age  19.  Taking vitamin and mineral supplements as recommended by your health care provider. What happens during an annual well check? The services and screenings done by your health care provider during your annual well check will depend on your age, overall health, lifestyle risk factors, and family history of disease. Counseling  Your health care provider may ask you questions about your:  Alcohol use.  Tobacco use.  Drug use.  Emotional well-being.  Home and relationship well-being.  Sexual activity.  Eating habits.  Work and work Astron[employer].  Method of birth control.  Menstrual cycle.  Pregnancy history. Screening  You may have the following tests or measurements:  Height, weight, and BMI.  Blood pressure.  Lipid and cholesterol levels. These may be checked every 5 years, or more frequently if you are over 69 years old.  Skin check.  Lung cancer screening. You may have this screening every year starting at age 23 if you have a 30-pack-year history of smoking and currently smoke or have quit within the past 15 years.  Fecal occult blood test (FOBT) of the stool. You may have this test every year starting at age 5.  Flexible sigmoidoscopy or colonoscopy. You may have a sigmoidoscopy every 5 years or a colonoscopy every 10 years starting at age 109.  Hepatitis C blood test.  Hepatitis B blood test.  Sexually transmitted disease (STD) testing.  Diabetes screening. This is done by checking your blood sugar (glucose) after you have not eaten for a while (fasting). You may have this done every 1-3 years.  Mammogram. This may be done every 1-2 years.  Talk to your health care provider about when you should start having regular mammograms. This may depend on whether you have a family history of breast cancer.  BRCA-related cancer screening. This may be done if you have a family history of breast, ovarian, tubal, or peritoneal cancers.  Pelvic exam and Pap test. This  may be done every 3 years starting at age 11. Starting at age 18, this may be done every 5 years if you have a Pap test in combination with an HPV test.  Bone density scan. This is done to screen for osteoporosis. You may have this scan if you are at high risk for osteoporosis. Discuss your test results, treatment options, and if necessary, the need for more tests with your health care provider. Vaccines  Your health care provider may recommend certain vaccines, such as:  Influenza vaccine. This is recommended every year.  Tetanus, diphtheria, and acellular pertussis (Tdap, Td) vaccine. You may need a Td booster every 10 years.  Zoster vaccine. You may need this after age 45.  Pneumococcal 13-valent conjugate (PCV13) vaccine. You may need this if you have certain conditions and were not previously vaccinated.  Pneumococcal polysaccharide (PPSV23) vaccine. You may need one or two doses if you smoke cigarettes or if you have certain conditions. Talk to your health care provider about which screenings and vaccines you need and how often you need them. This information is not intended to replace advice given to you by your health care provider. Make sure you discuss any questions you have with your health care provider. Document Released: 10/04/2015 Document Revised: 05/27/2016 Document Reviewed: 07/09/2015 Elsevier Interactive Patient Education  2017 [place] Prevention in the Home Falls can cause injuries. They can happen to people of all ages. There are many things you can do to make your home safe and to help prevent falls. What can I do on the outside of my home?  Regularly fix the edges of walkways and driveways and fix any cracks.  Remove anything that might make you trip as you walk through a door, such as a raised step or threshold.  Trim any bushes or trees on the path to your home.  Use bright outdoor lighting.  Clear any walking paths of anything that might  make someone trip, such as rocks or tools.  Regularly check to see if handrails are loose or broken. Make sure that both sides of any steps have handrails.  Any raised decks and porches should have guardrails on the edges.  Have any leaves, snow, or ice cleared regularly.  Use sand or salt on walking paths during winter.  Clean up any spills in your garage right away. This includes oil or grease spills. What can I do in the bathroom?  Use night lights.  Install grab bars by the toilet and in the tub and shower. Do not use towel bars as grab bars.  Use non-skid mats or decals in the tub or shower.  If you need to sit down in the shower, use a plastic, non-slip stool.  Keep the floor dry. Clean up any water that spills on the floor as soon as it happens.  Remove soap buildup in the tub or shower regularly.  Attach bath mats securely with double-sided non-slip rug tape.  Do not have throw rugs and other things on the floor that can make you trip. What can I do in the bedroom?  Use night lights.  Make sure that  you have a light by your bed that is easy to reach.  Do not use any sheets or blankets that are too big for your bed. They should not hang down onto the floor.  Have a firm chair that has side arms. You can use this for support while you get dressed.  Do not have throw rugs and other things on the floor that can make you trip. What can I do in the kitchen?  Clean up any spills right away.  Avoid walking on wet floors.  Keep items that you use a lot in easy-to-reach places.  If you need to reach something above you, use a strong step stool that has a grab bar.  Keep electrical cords out of the way.  Do not use floor polish or wax that makes floors slippery. If you must use wax, use non-skid floor wax.  Do not have throw rugs and other things on the floor that can make you trip. What can I do with my stairs?  Do not leave any items on the stairs.  Make sure  that there are handrails on both sides of the stairs and use them. Fix handrails that are broken or loose. Make sure that handrails are as long as the stairways.  Check any carpeting to make sure that it is firmly attached to the stairs. Fix any carpet that is loose or worn.  Avoid having throw rugs at the top or bottom of the stairs. If you do have throw rugs, attach them to the floor with carpet tape.  Make sure that you have a light switch at the top of the stairs and the bottom of the stairs. If you do not have them, ask someone to add them for you. What else can I do to help prevent falls?  Wear shoes that:  Do not have high heels.  Have rubber bottoms.  Are comfortable and fit you well.  Are closed at the toe. Do not wear sandals.  If you use a stepladder:  Make sure that it is fully opened. Do not climb a closed stepladder.  Make sure that both sides of the stepladder are locked into place.  Ask someone to hold it for you, if possible.  Clearly mark and make sure that you can see:  Any grab bars or handrails.  First and last steps.  Where the edge of each step is.  Use tools that help you move around (mobility aids) if they are needed. These include:  Canes.  Walkers.  Scooters.  Crutches.  Turn on the lights when you go into a dark area. Replace any light bulbs as soon as they burn out.  Set up your furniture so you have a clear path. Avoid moving your furniture around.  If any of your floors are uneven, fix them.  If there are any pets around you, be aware of where they are.  Review your medicines with your doctor. Some medicines can make you feel dizzy. This can increase your chance of falling. Ask your doctor what other things that you can do to help prevent falls. This information is not intended to replace advice given to you by your health care provider. Make sure you discuss any questions you have with your health care provider. Document  Released: 07/04/2009 Document Revised: 02/13/2016 Document Reviewed: 10/12/2014 Elsevier Interactive Patient Education  2017 [employer].

## 2020-10-10 ENCOUNTER — Ambulatory Visit: Payer: PPO | Admitting: Pharmacist

## 2020-10-10 DIAGNOSIS — F339 Major depressive disorder, recurrent, unspecified: Secondary | ICD-10-CM

## 2020-10-10 DIAGNOSIS — E039 Hypothyroidism, unspecified: Secondary | ICD-10-CM

## 2020-10-10 NOTE — Chronic Care Management (AMB) (Signed)
Chronic Care Management Pharmacy  Name: SUMANA OESTERLE  MRN: [id number] DOB: [birth year]  Initial Questions: 1. Have you seen any other providers since your last visit? Yes  2. Any changes in your medicines or health? Yes   Chief Complaint/ HPI  Jo [name],  60 y.o. , female presents for their Follow-Up CCM visit with the clinical pharmacist via telephone due to COVID-19 Pandemic.  PCP : Caren Macadam, MD  Their chronic conditions include: Asthma, hypothyroidism, iron deficiency, PCOS, folate deficiency, systemic lupus, GERD, nausea, GI spasms, chronic regional pain syndrome, menopausal symptoms, depression/ anxiety, allergic rhinitis, vitamin B12 deficiency  Office Visits: 10/09/20 Ofilia Neas, LPN: Patient presented for medicare annual wellness visit.  07/17/20 Micheline Rough, MD: Patient presented for video visit for chronic conditions follow up. Prescribed methotrexate 10 mg weekly.  04/26/20 Micheline Rough, MD - Patient presented for a video visit for follow up on chronic medical conditions. Patient reports allergies are getting worse. Pain doctor retired and needs records transferred to new pain doctor. Asked her to decrease Zanaflex to 8 mg nightly to help with fatigue. Recommended decreasing Lamictal to 1/2 tablet twice a day for her mood.   Consult Visit: 09/07/20 [name] (rheumatology): Patient presented for lupus follow up.  08/13/20 Nicholaus Bloom (pain medicine): Patient presented for chronic pain follow up. Unable to access notes.  05/15/20 Wilber Bihari, NP (oncology): Patient presented for iron deficiency follow up. Patient reports chronic fatigue and weakness have worsened over the past year. Continue IV iron as needed and folate supplementation regularly. Plan to repeat iron studies every 3 months and follow up visit in 1 year.   12/13/2019- Hematology and Oncology- Patient presented to Wilber Bihari, NP for iron deficiency anemia and folate deficiency.  Patient to begin IV iron infusion. Patient advised to start folic acid as recommended by PCP.    Medications: Outpatient Encounter Medications as of 10/10/2020  Medication Sig Note  . levothyroxine (SYNTHROID) 125 MCG tablet TAKE 1 TABLET BY MOUTH EVERY DAY   . methotrexate (RHEUMATREX) 2.5 MG tablet Take 4 tablets (10 mg total) by mouth once a week.   . tapentadol (NUCYNTA ER) 50 MG 12 hr tablet Take 50 mg by mouth every 12 (twelve) hours.   Marland Kitchen azelastine (ASTELIN) 0.1 % nasal spray Place 1 spray into both nostrils 2 (two) times daily. Use in each nostril as directed   . Belimumab (BENLYSTA) 200 MG/ML SOSY 1 ml   . busPIRone (BUSPAR) 15 MG tablet TAKE 1/2 TABLET TWICE A DAY   . cholecalciferol (VITAMIN D) 1000 UNITS tablet Take 2,000 Units by mouth daily.   . clindamycin (CLEOCIN T) 1 % lotion Apply 1 application topically 2 (two) times daily.    . Cranberry 200 MG CAPS Take 200 mg by mouth 2 (two) times daily. 09/25/2015: Received from: [facility]: Take by mouth.  . cyanocobalamin (,VITAMIN B-12,) 1000 MCG/ML injection INJECT 1 ML INTO THE MUSCLE ONCE FOR 1 DOSE alternating every 2 weeks x 2 and then monthly (Patient taking differently: Inject 1,000 mcg into the muscle as directed.)   . erythromycin with ethanol (EMGEL) 2 % gel Apply topically 2 (two) times daily.   Marland Kitchen estradiol (ESTRACE) 2 MG tablet TAKE 1 TABLET BY MOUTH EVERY DAY   . fexofenadine-pseudoephedrine (ALLEGRA-D 24) 180-240 MG 24 hr tablet Take 1 tablet by mouth at bedtime.   . fluticasone (CUTIVATE) 0.05 % cream Apply 1 application topically 2 (two) times daily as needed (rash).    Marland Kitchen  fluticasone (FLONASE) 50 MCG/ACT nasal spray Place 2 sprays into both nostrils daily.   . folic acid (FOLVITE) 1 MG tablet Take 1 tablet (1 mg total) by mouth daily.   Marland Kitchen lamoTRIgine (LAMICTAL) 100 MG tablet Take 1 tablet (100 mg total) by mouth 2 (two) times daily. (Patient taking differently: Take 100 mg by mouth 2 (two) times daily.  Take 1/2 tab PO in the morning and 1 tablet at HS)   . medroxyPROGESTERone (PROVERA) 2.5 MG tablet TAKE 1 TABLET ALTERNATING WITH 2 TABLETS DAILY   . metFORMIN (GLUCOPHAGE) 500 MG tablet Take 2 tabs q am, 1 tab qhs.   . methadone (DOLOPHINE) 10 MG tablet Take 1-2 tablets (10-20 mg total) by mouth in the morning, at noon, and at bedtime. Patient reports taking, 2 tablets in the morning, 2 tablets around noon, and 1 tablet at bedtime.   . Metoclopramide HCl 10 MG TBDP Take 1 tablet (10 mg total) by mouth 4 (four) times daily.   . montelukast (SINGULAIR) 10 MG tablet TAKE 1 TABLET BY MOUTH EVERYDAY AT BEDTIME   . nystatin cream (MYCOSTATIN) Apply 1 application topically 2 (two) times daily.   [place] Bicarbonate (ZEGERID) 20-1100 MG CAPS capsule Take 1 capsule by mouth daily.   . ondansetron (ZOFRAN) 4 MG tablet Take 1 tablet (4 mg total) by mouth every 8 (eight) hours as needed for nausea.   . sertraline (ZOLOFT) 100 MG tablet TAKE 2 TABLETS BY MOUTH EVERY DAY   . sucralfate (CARAFATE) 1 g tablet Take 1 tablet (1 g total) by mouth 4 (four) times daily -  with meals and at bedtime. (Patient taking differently: Take 1 g by mouth 3 (three) times daily as needed (indigestion).)   . Syringe/Needle, Disp, (SYRINGE 3CC/25GX1-1/2") 25G X 1-1/2" 3 ML MISC Inject 1cc (1038mcg) B12 q 2-4 weeks as directed   . tiZANidine (ZANAFLEX) 4 MG capsule Take 3 capsules (12 mg total) by mouth at bedtime as needed for muscle spasms.   Marland Kitchen topiramate (TOPAMAX) 25 MG tablet TAKE 1 TABLET 5 TIMES A DAY   . triamcinolone cream (KENALOG) 0.1 %     Facility-Administered Encounter Medications as of 10/10/2020  Medication  . cyanocobalamin ((VITAMIN B-12)) injection 1,000 mcg   Patient reports her biggest change is with starting Nucynta and she has an appt on 1/28 plan to continue to titrate. She hasn't noticed a big difference in her pain and they told her it will take up to 2 months. She has noticed a cravings for  carbs at night specifically. Patient is  Not consistent with eating and only eats 1 meal a day at dinner. Discussed trying to eat more throughout the day to avoid night cravings.   Current Diagnosis/Assessment:  Goals Addressed            This Visit's Progress   . Pharmacy Care Plan       CARE PLAN ENTRY  Current Barriers:  . Chronic Disease Management support, education, and care coordination needs related to  Asthma, hypothyroidism, iron deficiency, PCOS, folate deficiency, systemic lupus, GERD, nausea, GI spasms, chronic regional pain syndrome, menopausal symptoms, depression/ anxiety, allergic rhinitis, vitamin B12 deficiency  Current Barriers:  . Polypharmacy; complex patient with multiple comorbidities   Uncontrolled asthma, treatment options limited by: not tolerating previous nebulizers and inhalers  . Current regimen: montelukast 10 mg, 1 tablet at bedtime Hypothyroidism . Current regimen: levothyroxine 140mcg, 1 tablet once daily  Iron deficiency:  . Current regimen: Ferumoxytol (FERAHEME)  infusion  . Patient to return in 1 month after blood work.  PCOS . Current regimen: metformin 500mg , 2 tablets every morning and 1 tablet at bedtime Folate deficiency . Current regimen: folic acid 1 mg, 1 tablet once daily  Systemic lupus . Current regimen:  methotrexate 2.5mg , 4 tablets at bedtime Acid reflux  Current regimen: Zegerid 20-1100mg , 1 capsule once daily    sucralfate 1g, 1 tablet four times daily with meals and at bedtime  Nausea/ spasms Current regimen:   Metoclopramide 10mg , 1 tablet four times daily (patient has been using as needed)  ondansetron (Zofran) 4mg , 1 tablet every eight hours as needed for nausea Complex regional pain syndrome Current regimen:   hydromorphone 4mg , 1 tablet every 4 hours as needed for moderate pain or severe pain   methadone 10mg , (Patient reports taking: 2 tablet in the morning, 2 tablets around noon, and 1 tab at  bedtime).  Nucynta 50 mg 1 tablet every 12 hours  topiramate 25mg , 1 tablet in the morning and 3 tablets at bedtime   tizanidine 4mg , 3 capsules at bedtime as needed for muscle spasms  Menopausal symptoms Current regimen:  . estradiol 2mg , 1 tablet once daily  . medroxyprogesterone 2.5mg , 1 tablet alternating with 2 tablets once daily  Depression/anxiety Current regimen:  . buspirone (Buspar) 15mg , 0.5 tablet (7.5mg ) twice daily  . sertraline 100mg , 2 tablets once daily . lamotrigine 100mg ,1/2 tablet in AM and 1 tablet in PM  Allergic rhinitis Current regimen:  . azelastine (Astelin) 0.1% nasal spray, 1 spray into both nostrils twice daily  . Allegra, 180mg , 1 tablet at bedtime . fluticasone (Flonase) 88mcg/act nasal spray, 2 sprays into each nostril every day  Vitamin B12 deficiency . Current regimen: cyanocobalamin 1000 mcg/ml, inject 1 ML into muscle every 2 weeks for 2 doses and then once every month   Pharmacist Clinical Goal(s):  Marland Kitchen Over the next 150 days, patient will work with PharmD and provider towards optimized medication management.  . Patient to find and make appointment with a new gynecologist and discuss stopping hormone therapy  Interventions: . Comprehensive medication review performed; medication list updated in electronic medical record . Inter-disciplinary care team collaboration . Instructed patient to continue all medications as instructed.  . Discussed non-pharmacological interventions for acid reflux. Take measures to prevent acid reflux, such as avoiding spicy foods, avoiding caffeine, avoid laying down a few hours after eating, and raising the head of the bed. . Discussed consistent intake of levothyroxine in regards to meals and other medications.   Patient Self Care Activities:  . Patient will take medications as prescribed   Please see past updates related to this goal by clicking on the "Past Updates" button in the selected goal           Asthma  Patient notes shortness of breath has been the same since last time she saw Dr. Ethlyn Gallery. She notes still having a non- persistent cough.   Eosinophil count:   Lab Results  Component Value Date/Time   EOSPCT 5 08/13/2020 03:38 PM  %                               Eos (Absolute):  Lab Results  Component Value Date/Time   EOSABS 0.3 08/13/2020 03:38 PM   Tobacco Status:  Social History   Tobacco Use  Smoking Status Never Smoker  Smokeless Tobacco Never Used   Patient has failed these meds  in past: patient states previous nebulizers and inhalers have not worked for her (makes her shaky).   Patient is currently controlled on the following medications:   montelukast (Singulair) 10mg , 1 tablet daily at bedtime   Using maintenance inhaler regularly? No- does not use Frequency of rescue inhaler use:  NA- does not use   Plan Continue current medications.   Hypothyroidism   TSH  Date Value Ref Range Status  07/30/2020 1.82 0.40 - 4.50 mIU/L Final    Patient has failed these meds in past: none  Patient is currently controlled on the following medications:   levothyroxine 116mcg, 1 tablet once daily   We discussed:  consistent administration of levothyroxine (regards to food and other medications)  - Patient reports taking in the morning with all of her other medications -Plan to move levothyroxine to before lunch time so it can be given on an empty stomach  Plan Continue current medications.   Iron deficiency   Patient has failed these meds in past: oral supplement Patient is currently controlled on the following medications:   Ferumoxytol [facility]) infusion   Hemoglobin & Hematocrit     Component Value Date/Time   HGB 12.5 08/13/2020 1538   HCT 38.4 08/13/2020 1538   Iron/TIBC/Ferritin/ %Sat    Component Value Date/Time   IRON 108 08/13/2020 1538   TIBC 353 08/13/2020 1538   FERRITIN 152 08/13/2020 1538   IRONPCTSAT 31 08/13/2020 1538    Plan Patient is getting iron studies checked every 3 months and supplemented as needed - followed by hematology. Continue current medications.   PCOS   Recent Relevant Labs: Lab Results  Component Value Date/Time   HGBA1C 5.6 11/15/2019 09:49 AM    Patient has failed these meds in past: none   Patient is currently controlled on the following medications:   metformin 500mg , 2 tablets every morning and 1 tablet at bedtime   We discussed: diet and exercise extensively    Plan Managed by Dr. Wilber Bihari. Continue current medications  Folate deficiency  Patient is currently managed on the following medications:   folic acid 1 mg, 1 tablet once daily   Folate  05/15/20: 91.9 12/13/19: 26.3 11/15/19: 6.0  Plan Continue current medications.   Systemic lupus  Patient stated she was recently diagnosed with lupus. Experienced side effects with higher dose of methotrexate (mood instability) and the 4 tablets are working.  Patient has failed these meds in past: Leflunomide (headaches, pain), hydroxychloroquine (nausea) Patient is currently controlled on the following medications:  -methotrexate 2.5 mg  4 tablets once a week  -Benlysta - need information for patient assistance  Discussed interaction with levothyroxine and methotrexate to make sure she is separating them  Plan  Managed by Dr. [name] - still working on patient assistance. Continue current medications.   GI health (GERD, nausea, GI spasms)   Patient reports minor symptoms of reflux and only really started with lupus flare.  Patient has failed these meds in past:  Patient is currently controlled on the following medications:   Zegerid 20-1100mg , 1 capsule once daily    sucralfate 1g, 1 tablet four times daily with meals and at bedtime   Metoclopramide 10mg , 1 tablet four times daily (patient has been using as needed)  ondansetron (Zofran) 4mg , 1 tablet every eight hours as needed for  nausea  We discussed:  non-pharmacological interventions for acid reflux. Take measures to prevent acid reflux, such as avoiding spicy foods, avoiding caffeine, avoid laying down a  few hours after eating, and raising the head of the bed. -Noted more nausea and usually from pain  Plan Managed by Dr. Silvano Rusk. Continue current medications  Complex regional pain syndrome   Patient has failed these meds in past: patient states she has tried multiple treatments; she states she has been in several studies and states she does not want to participate in anymore trials.   Patient is currently controlled on the following medications:   hydromorphone 4mg , 1 tablet every 4 hours as needed for moderate pain or severe pain   methadone 10mg , (Patient reports taking: 2 tablet in the morning, 2 tablets around noon, and 1 tab at bedtime).  topiramate 25mg , 1 tablet in the morning and 3 tablets at bedtime   tizanidine 4mg , 4 capsules at bedtime as needed for muscle spasms    Nucynta ER 50 mg 2 tablets daily  Plan Managed by new pain management Dr. Nicholaus Bloom. Continue current medications  Menopausal symptoms   Patient stated taking this for about 10 years now. Patient is still in process of finding a new gynecologist. Symptoms included: hot flashes, insomnia  Patient has failed these meds in past: none Patient is currently controlled on the following medications:  - estradiol 2mg , 1 tablet once daily  - medroxyprogesterone 2.5mg , 1 tablet alternating with 2 tablets once daily   Discussed: long term risks of taking hormonal therapy (cancer, strokes, blot clots, etc); patient has been on since age 26  Plan Patient to obtain a new gynecologist and plans to discuss duration of hormonal therapy.  Depression/ anxiety   Patient has failed these meds in past: patient stated trying several medications and "did not do well" on them.   Patient is currently controlled on the following  medications:  - buspirone (Buspar) 15mg , 0.5 tablet (7.5mg ) twice daily  - sertraline 100mg , 2 tablets once daily - lamotrigine 100mg ,1/2 tablet in AM and 1 tablet in PM   Plan Continue current medications.  Allergic rhinitis   Patient stated still having some symptoms. Patient mentioned using azelastine nasal spray once daily.   Patient has failed these meds in past: none  Patient is currently controlled on the following medications:  - azelastine (Astelin) 0.1% nasal spray, 1 spray into both nostrils twice daily  - Allegra, 180mg , 1 tablet at bedtime - fluticasone (Flonase) 60mcg/act nasal spray, 2 sprays into each nostril every day   Plan Continue current medications  Vitamin B-12 deficiency   Patient has failed these meds in past: none Patient is currently controlled on the following medications:  - cyanocobalamin 1000 mcg/ml, inject 1 ML into muscle every 2 weeks for 2 doses and then once every month  Vitamin B12: 147 (12/13/2019)   Plan Continue current medications   Vaccines   Reviewed and discussed patient's vaccination history.    Immunization History  Administered Date(s) Administered  . Influenza, Quadrivalent, Recombinant, Inj, Pf 06/18/2020  . Influenza, Seasonal, Injecte, Preservative Fre 06/21/2014, 06/26/2015, 07/29/2016  . Influenza,inj,Quad PF,6+ Mos 07/15/2017, 06/15/2018, 06/13/2019  . Influenza,inj,quad, With Preservative 07/11/2014, 07/19/2017  . Influenza,trivalent, recombinat, inj, PF 06/14/2013  . Influenza-Unspecified 06/18/2020  . PFIZER(Purple Top)SARS-COV-2 Vaccination 12/28/2019, 01/25/2020, 08/06/2020  . Pneumococcal Polysaccharide-23 10/09/2015  . Tdap 10/23/2007   Patient received first dose of Shingles vaccine at [facility].  Plan Recommended patient receive second dose of Shingrix and tetanus vaccine in office/at pharmacy.    Medication Management   Pt uses [facility] for all medications Uses pill box? No - adherence  packaging Pt endorses 90% compliance - patient has missed some doses of evening medicine due to fatigue  We discussed: Discussed benefits of medication synchronization, packaging and delivery as well as enhanced pharmacist oversight with [facility].  Plan  Utilize [facility] pharmacy for medication synchronization, packaging and delivery   Follow up: 5 month phone visit   [name], PharmD Clinical Pharmacist [facility]

## 2020-10-14 DIAGNOSIS — M542 Cervicalgia: Secondary | ICD-10-CM | POA: Diagnosis not present

## 2020-10-14 DIAGNOSIS — M359 Systemic involvement of connective tissue, unspecified: Secondary | ICD-10-CM | POA: Diagnosis not present

## 2020-10-14 DIAGNOSIS — M791 Myalgia, unspecified site: Secondary | ICD-10-CM | POA: Diagnosis not present

## 2020-10-14 DIAGNOSIS — M329 Systemic lupus erythematosus, unspecified: Secondary | ICD-10-CM | POA: Diagnosis not present

## 2020-10-14 DIAGNOSIS — G8929 Other chronic pain: Secondary | ICD-10-CM | POA: Diagnosis not present

## 2020-10-14 DIAGNOSIS — Z79899 Other long term (current) drug therapy: Secondary | ICD-10-CM | POA: Diagnosis not present

## 2020-10-14 DIAGNOSIS — R5383 Other fatigue: Secondary | ICD-10-CM | POA: Diagnosis not present

## 2020-10-14 DIAGNOSIS — I7300 Raynaud's syndrome without gangrene: Secondary | ICD-10-CM | POA: Diagnosis not present

## 2020-10-14 DIAGNOSIS — G9050 Complex regional pain syndrome I, unspecified: Secondary | ICD-10-CM | POA: Diagnosis not present

## 2020-10-14 DIAGNOSIS — M1990 Unspecified osteoarthritis, unspecified site: Secondary | ICD-10-CM | POA: Diagnosis not present

## 2020-10-14 DIAGNOSIS — R768 Other specified abnormal immunological findings in serum: Secondary | ICD-10-CM | POA: Diagnosis not present

## 2020-10-16 DIAGNOSIS — G894 Chronic pain syndrome: Secondary | ICD-10-CM | POA: Diagnosis not present

## 2020-10-16 DIAGNOSIS — Z79891 Long term (current) use of opiate analgesic: Secondary | ICD-10-CM | POA: Diagnosis not present

## 2020-10-16 DIAGNOSIS — M542 Cervicalgia: Secondary | ICD-10-CM | POA: Diagnosis not present

## 2020-10-16 DIAGNOSIS — G90512 Complex regional pain syndrome I of left upper limb: Secondary | ICD-10-CM | POA: Diagnosis not present

## 2020-11-08 ENCOUNTER — Telehealth: Payer: Self-pay | Admitting: Pharmacist

## 2020-11-08 NOTE — Chronic Care Management (AMB) (Signed)
Chronic Care Management Pharmacy Assistant   Name: Jo [name]  MRN: [id number] DOB: [birth year]  Reason for Encounter: Medication Review  PCP : Caren Macadam, MD  Allergies:   Allergies  Allergen Reactions  . Bee Pollen Anaphylaxis  . Diazepam     sucidal depression  . Prednisone Nausea And Vomiting    Per pt can not take oral steroids   . Amoxicillin     Severe diarrhea At high dose   . Doxycycline     Pt does not remember reaction  . Erythromycin     n & v  . Morphine     n & v  . Propoxyphene N-Acetaminophen     n & V  . Sulfa Antibiotics     MIGRAINES  . Tetracyclines & Related     N & v  . Propoxyphene Other (See Comments)    Medications: Outpatient Encounter Medications as of 11/08/2020  Medication Sig Note  . azelastine (ASTELIN) 0.1 % nasal spray Place 1 spray into both nostrils 2 (two) times daily. Use in each nostril as directed   . Belimumab (BENLYSTA) 200 MG/ML SOSY 1 ml   . busPIRone (BUSPAR) 15 MG tablet TAKE 1/2 TABLET TWICE A DAY   . cholecalciferol (VITAMIN D) 1000 UNITS tablet Take 2,000 Units by mouth daily.   . clindamycin (CLEOCIN T) 1 % lotion Apply 1 application topically 2 (two) times daily.    . Cranberry 200 MG CAPS Take 200 mg by mouth 2 (two) times daily. 09/25/2015: Received from: [facility]: Take by mouth.  . cyanocobalamin (,VITAMIN B-12,) 1000 MCG/ML injection INJECT 1 ML INTO THE MUSCLE ONCE FOR 1 DOSE alternating every 2 weeks x 2 and then monthly (Patient taking differently: Inject 1,000 mcg into the muscle as directed.)   . erythromycin with ethanol (EMGEL) 2 % gel Apply topically 2 (two) times daily.   Marland Kitchen estradiol (ESTRACE) 2 MG tablet TAKE 1 TABLET BY MOUTH EVERY DAY   . fexofenadine-pseudoephedrine (ALLEGRA-D 24) 180-240 MG 24 hr tablet Take 1 tablet by mouth at bedtime.   . fluticasone (CUTIVATE) 0.05 % cream Apply 1 application topically 2 (two) times daily as needed (rash).    . fluticasone (FLONASE)  50 MCG/ACT nasal spray Place 2 sprays into both nostrils daily.   . folic acid (FOLVITE) 1 MG tablet Take 1 tablet (1 mg total) by mouth daily.   Marland Kitchen lamoTRIgine (LAMICTAL) 100 MG tablet Take 1 tablet (100 mg total) by mouth 2 (two) times daily. (Patient taking differently: Take 100 mg by mouth 2 (two) times daily. Take 1/2 tab PO in the morning and 1 tablet at HS)   . levothyroxine (SYNTHROID) 125 MCG tablet TAKE 1 TABLET BY MOUTH EVERY DAY   . medroxyPROGESTERone (PROVERA) 2.5 MG tablet TAKE 1 TABLET ALTERNATING WITH 2 TABLETS DAILY   . metFORMIN (GLUCOPHAGE) 500 MG tablet Take 2 tabs q am, 1 tab qhs.   . methadone (DOLOPHINE) 10 MG tablet Take 1-2 tablets (10-20 mg total) by mouth in the morning, at noon, and at bedtime. Patient reports taking, 2 tablets in the morning, 2 tablets around noon, and 1 tablet at bedtime.   . methotrexate (RHEUMATREX) 2.5 MG tablet Take 4 tablets (10 mg total) by mouth once a week.   . Metoclopramide HCl 10 MG TBDP Take 1 tablet (10 mg total) by mouth 4 (four) times daily.   . montelukast (SINGULAIR) 10 MG tablet TAKE 1 TABLET BY MOUTH  EVERYDAY AT BEDTIME   . nystatin cream (MYCOSTATIN) Apply 1 application topically 2 (two) times daily.   [place] Bicarbonate (ZEGERID) 20-1100 MG CAPS capsule Take 1 capsule by mouth daily.   . ondansetron (ZOFRAN) 4 MG tablet Take 1 tablet (4 mg total) by mouth every 8 (eight) hours as needed for nausea.   . sertraline (ZOLOFT) 100 MG tablet TAKE 2 TABLETS BY MOUTH EVERY DAY   . sucralfate (CARAFATE) 1 g tablet Take 1 tablet (1 g total) by mouth 4 (four) times daily -  with meals and at bedtime. (Patient taking differently: Take 1 g by mouth 3 (three) times daily as needed (indigestion).)   . Syringe/Needle, Disp, (SYRINGE 3CC/25GX1-1/2") 25G X 1-1/2" 3 ML MISC Inject 1cc (1024mcg) B12 q 2-4 weeks as directed   . tapentadol (NUCYNTA ER) 50 MG 12 hr tablet Take 50 mg by mouth every 12 (twelve) hours.   Marland Kitchen tiZANidine (ZANAFLEX)  4 MG capsule Take 3 capsules (12 mg total) by mouth at bedtime as needed for muscle spasms.   Marland Kitchen topiramate (TOPAMAX) 25 MG tablet TAKE 1 TABLET 5 TIMES A DAY   . triamcinolone cream (KENALOG) 0.1 %     Facility-Administered Encounter Medications as of 11/08/2020  Medication  . cyanocobalamin ((VITAMIN B-12)) injection 1,000 mcg    Current Diagnosis: Patient Active Problem List   Diagnosis Date Noted  . Depression, recurrent ([place]) 07/22/2020  . Systemic lupus erythematosus ([place]) 04/29/2020  . Iron deficiency 12/13/2019  . Reflex sympathetic dystrophy of lower extremity 06/12/2019  . Disorder of thyroid gland 06/12/2019  . Falls 09/25/2015  . Musculoskeletal neck pain 09/25/2015  . Headache 09/25/2015  . Memory changes 09/25/2015  . Blurry vision 09/25/2015  . Neck pain 09/25/2015  . Polycystic ovaries 07/11/2013  . Hypothyroidism 05/04/2013  . Complex regional pain syndrome I, unspecified 03/06/2013  . Extremity pain 03/06/2013  . Helicobacter pylori infection 12/22/2012  . History of colonic polyps 02/25/2009  . OBESITY 01/14/2008  . DEPRESSION/ANXIETY 01/14/2008  . Asthma 01/14/2008  . GERD 01/14/2008  . Irritable colon 01/14/2008    Goals Addressed   None    Reviewed chart for medication changes ahead of medication coordination call. No OVs, Consults, or hospital visits since last care coordination call/Pharmacist visit.  No medication changes indicated.  BP Readings from Last 3 Encounters:  07/17/20 117/70  05/15/20 110/84  12/27/19 105/66    Lab Results  Component Value Date   HGBA1C 5.6 11/15/2019     Patient obtains medications through Adherence Packaging  90 Days   Last adherence delivery included:  Marland Kitchen Methadone 10 mg; two tabs a breakfast, one tab at lunch, one tab at bedtime  . Levothyroxine 125 mcg; one tab before breakfast  . Topiramate 25 mg; one tab in the morning, four tabs at bedtime . Sertraline 10 mg; two tabs before breakfast . Buspirone 15  mg; one half tab before breakfast, one half tab at bedtime . Metformin 500 mg; two tabs before breakfast, one tab at bedtime . Tizanidine 4 mg; four tabs at bedtime   . Lamotrigine 10 mg; one tab before breakfast, one tab at bedtime  . Montelukast 10 mg; one tab at bedtime . Medroxyprogesterone 2.5 mg; one tabs M,W,F two tabs T,TH,S before breakfast . Estradiol 2 mg; one tab at breakfast . Methotrexate Sodium 2.5 mg; four tabs on Fridays at bedtime . Folic Acid 1 mg; one tab before breakfast . Fluticasone nasal spray; two sprays in both nostrils daily .  Cyanocobalamin ((VITAMIN B-12)) injection 1,000 mcg    . Fluticasone (CUTIVATE) 0.05 % cream: Apply 1 application topically 2 (two) times daily as needed  Patient declined the following medication last month due to PRN use/additional supply on hand. . Triamcinolone 0.1% cream; apply to face twice daily one week on, one week off PRN . Ketoconazole 2% cream; apply to feet twice daily PRN . Allegra 180 mg; one tab every day (does not need) . Zegerid 40 mg; one cap daily (does not need) . Hydromorphone 4 mg; one tab three times a day PRN (will call when needed) . Vitamin D3 2000; one tab once a day OTC (will call when needed)  I spoke with the patient and review medications. There are no changes in medications currently. Patient declined these medication this month due to PRN use/additional supply on hand.The patient is taking the following medications: Marland Kitchen Montelukast 10 mg, 1 tablet at bedtime . Levothyroxine 125 mcg, 1 tablet once daily  . Ferumoxytol (FERAHEME) infusion   . Metformin 500mg , 2 tablets every morning and 1 tablet at bedtime . Folic acid 1 mg, 1 tablet once daily  . methotrexate 2.5 mg, 4 tablets at bedtime . Zegerid 20-1100 mg, 1 capsule once daily   . Sucralfate 1g, 1 tablet four times daily with meals and at bedtime  . Metoclopramide 10 mg, 1 tablet four times daily (patient has been using as needed) . Ondansetron (Zofran)  4 mg, 1 tablet every eight hours as needed for nausea . Hydromorphone 4 mg, 1 tablet every 4 hours as needed for moderate pain or severe pain  . Methadone 10 mg, (Patient reports taking: 2 tablets in the morning, 2 tablets around noon, and 1 tab at bedtime). . Nucynta 50 mg 1 tablet every 12 hours . Topiramate 25 mg, 1 tablet in the morning and 3 tablets at bedtime  . Tizanidine 4 mg, 3 capsules at bedtime as needed for muscle spasms  . Estradiol 2 mg, 1 tablet once daily  . Medroxyprogesterone 2.5 mg, 1 tablet alternating with 2 tablets once daily  . Buspirone (Buspar) 15 mg, 0.5 tablet (7.5 mg) twice daily  . Sertraline 100 mg, 2 tablets once daily . Lamotrigine 100 mg,1/2 tablet in AM and 1 tablet in PM  . Azelastine (Astelin) 0.1% nasal spray, 1 spray into both nostrils twice daily  . Allegra, 180 mg, 1 tablet at bedtime . Fluticasone (Flonase) 50 mcg/act nasal spray, 2 sprays into each nostril every day  . Cyanocobalamin 1000 mcg/ml, inject 1 ML into muscle every 2 weeks for 2 doses and then once every month   She currently does not need refills Confirmed delivery date of 12/17/2020, advised patient that pharmacy will contact them the morning of delivery.  Follow-Up:  Coordination of Enhanced Pharmacy Services and Pharmacist Review  Maia Breslow, [place] Assistant [phone]

## 2020-11-13 ENCOUNTER — Inpatient Hospital Stay: Payer: HMO | Attending: Family Medicine

## 2020-11-14 ENCOUNTER — Telehealth: Payer: Self-pay | Admitting: Family Medicine

## 2020-11-14 NOTE — Telephone Encounter (Signed)
Jo [name] is calling and wanted to confirm if the patient had Congestive Heart Failure or Diabetes to stay in the program that she is currently in, please advise. [phone]

## 2020-11-15 NOTE — Telephone Encounter (Signed)
Who is Janett Billow and what program is she referring to? (Patient is not diabetic and has not been diagnosed with heart failure, but if she is getting assistance in some way that is helpful for her overall health and chronic medical issues she is managing, I don't want to interfere with that)

## 2020-11-15 NOTE — Telephone Encounter (Signed)
She does not have either.

## 2020-11-15 NOTE — Telephone Encounter (Signed)
Left a detailed message on the voicemail for Jo [name] with the information below.

## 2020-11-15 NOTE — Telephone Encounter (Signed)
Spoke with Felecia at the number below, which is [facility].  Felecia was informed of the message below, stated she will inform Janett Billow of this and call back if needed.

## 2020-11-15 NOTE — Telephone Encounter (Signed)
Jo [name] from [facility] called needing clarification on the program that the patient is on because the program that she is on now she has to have diabetes or either congestive heart failure in order to stay in the program. If she doesn't have either one then Jo [name] has to move her to another program.  Jo [name] [facility] Members Service Line [phone]

## 2020-11-18 ENCOUNTER — Telehealth: Payer: Self-pay | Admitting: Cardiovascular Disease

## 2020-11-18 NOTE — Telephone Encounter (Signed)
Spoke with jasmine at [employer], aware we have not seen the patient since 2020 and there is no cardiac diagnosis in her chart.

## 2020-11-18 NOTE — Telephone Encounter (Signed)
Jo [name] with [employer] calling to confirm the patient has CHF. She states they can get a call back at the member services number and they can give the message to her. She states their calls are recorded so they would just need verbal confirmation. Phone: [phone]

## 2020-12-04 DIAGNOSIS — S60212A Contusion of left wrist, initial encounter: Secondary | ICD-10-CM | POA: Diagnosis not present

## 2020-12-06 ENCOUNTER — Other Ambulatory Visit: Payer: Self-pay | Admitting: Family Medicine

## 2020-12-06 DIAGNOSIS — F341 Dysthymic disorder: Secondary | ICD-10-CM

## 2020-12-06 DIAGNOSIS — N951 Menopausal and female climacteric states: Secondary | ICD-10-CM

## 2020-12-09 ENCOUNTER — Telehealth: Payer: Self-pay | Admitting: Pharmacist

## 2020-12-09 NOTE — Chronic Care Management (AMB) (Addendum)
Chronic Care Management Pharmacy Assistant   Name: Jo [name]  MRN: [id number] DOB: 12-30-1960  Reason for Encounter: Medication Review   Recent office visits:  None  Recent consult visits:  None  Hospital visits:  None in previous 6 months  Medications: Outpatient Encounter Medications as of 12/09/2020  Medication Sig Note   azelastine (ASTELIN) 0.1 % nasal spray Place 1 spray into both nostrils 2 (two) times daily. Use in each nostril as directed    Belimumab (BENLYSTA) 200 MG/ML SOSY 1 ml    busPIRone (BUSPAR) 15 MG tablet TAKE 1/2 TABLET BY MOUTH AT BREAKFAST AND AT BEDTIME    cholecalciferol (VITAMIN D) 1000 UNITS tablet Take 2,000 Units by mouth daily.    clindamycin (CLEOCIN T) 1 % lotion Apply 1 application topically 2 (two) times daily.     Cranberry 200 MG CAPS Take 200 mg by mouth 2 (two) times daily. 09/25/2015: Received from: [facility]: Take by mouth.   cyanocobalamin (,VITAMIN B-12,) 1000 MCG/ML injection INJECT 1 ML INTO THE MUSCLE ONCE FOR 1 DOSE alternating every 2 weeks x 2 and then monthly (Patient taking differently: Inject 1,000 mcg into the muscle as directed.)    erythromycin with ethanol (EMGEL) 2 % gel Apply topically 2 (two) times daily.    estradiol (ESTRACE) 2 MG tablet TAKE ONE TABLET BY MOUTH EVERY MORNING    fexofenadine-pseudoephedrine (ALLEGRA-D 24) 180-240 MG 24 hr tablet Take 1 tablet by mouth at bedtime.    fluticasone (CUTIVATE) 0.05 % cream Apply 1 application topically 2 (two) times daily as needed (rash).     fluticasone (FLONASE) 50 MCG/ACT nasal spray Place 2 sprays into both nostrils daily.    folic acid (FOLVITE) 1 MG tablet Take 1 tablet (1 mg total) by mouth daily.    lamoTRIgine (LAMICTAL) 100 MG tablet Take 1 tablet (100 mg total) by mouth 2 (two) times daily. (Patient taking differently: Take 100 mg by mouth 2 (two) times daily. Take 1/2 tab PO in the morning and 1 tablet at HS)    levothyroxine (SYNTHROID) 125 MCG  tablet TAKE 1 TABLET BY MOUTH EVERY DAY    medroxyPROGESTERone (PROVERA) 2.5 MG tablet TAKE 1 TABLET ALTERNATING WITH 2 TABLETS DAILY    metFORMIN (GLUCOPHAGE) 500 MG tablet Take 2 tabs q am, 1 tab qhs.    methadone (DOLOPHINE) 10 MG tablet Take 1-2 tablets (10-20 mg total) by mouth in the morning, at noon, and at bedtime. Patient reports taking, 2 tablets in the morning, 2 tablets around noon, and 1 tablet at bedtime.    methotrexate (RHEUMATREX) 2.5 MG tablet Take 4 tablets (10 mg total) by mouth once a week.    Metoclopramide HCl 10 MG TBDP Take 1 tablet (10 mg total) by mouth 4 (four) times daily.    montelukast (SINGULAIR) 10 MG tablet TAKE 1 TABLET BY MOUTH EVERYDAY AT BEDTIME    nystatin cream (MYCOSTATIN) Apply 1 application topically 2 (two) times daily.    Omeprazole-Sodium Bicarbonate (ZEGERID) 20-1100 MG CAPS capsule Take 1 capsule by mouth daily.    ondansetron (ZOFRAN) 4 MG tablet Take 1 tablet (4 mg total) by mouth every 8 (eight) hours as needed for nausea.    sertraline (ZOLOFT) 100 MG tablet TAKE TWO TABLETS BY MOUTH EVERY MORNING    sucralfate (CARAFATE) 1 g tablet Take 1 tablet (1 g total) by mouth 4 (four) times daily -  with meals and at bedtime. (Patient taking differently: Take 1 g  by mouth 3 (three) times daily as needed (indigestion).)    Syringe/Needle, Disp, (SYRINGE 3CC/25GX1-1/2") 25G X 1-1/2" 3 ML MISC Inject 1cc (1044mcg) B12 q 2-4 weeks as directed    tapentadol (NUCYNTA ER) 50 MG 12 hr tablet Take 50 mg by mouth every 12 (twelve) hours.    tiZANidine (ZANAFLEX) 4 MG capsule Take 3 capsules (12 mg total) by mouth at bedtime as needed for muscle spasms.    topiramate (TOPAMAX) 25 MG tablet TAKE 1 TABLET 5 TIMES A DAY    triamcinolone cream (KENALOG) 0.1 %     Facility-Administered Encounter Medications as of 12/09/2020  Medication   cyanocobalamin ((VITAMIN B-12)) injection 1,000 mcg   Reviewed chart for medication changes ahead of medication coordination  call. No medication changes indicated.  BP Readings from Last 3 Encounters:  07/17/20 117/70  05/15/20 110/84  12/27/19 105/66    Lab Results  Component Value Date   HGBA1C 5.6 11/15/2019    Patient obtains medications through Adherence Packaging  90 Days  Last adherence delivery included: Levothyroxine 125 mcg, 1 tablet once daily    Metformin 500 mg, 2 tablets every morning and 1 tablet at bedtime Folic acid 1 mg, 1 tablet twice daily  methotrexate 2.5 mg, 4 tablets at bedtime on Fridays Sucralfate 1g, 1 tablet four times daily with meals and at bedtime  Metoclopramide 10 mg, 1 tablet four times daily (patient has been using as needed) Ondansetron (Zofran) 4 mg, 1 tablet every eight hours as needed for nausea Methadone 10 mg, (Patient reports taking: 2 tablets in the morning, 2 tablets around noon, and 1 tab at bedtime). Nucynta 50 mg 1 tablet every 12 hours Topiramate 25 mg, 1 tablet in the morning and 3 tablets at bedtime  Tizanidine 4 mg, 3 tablets at bedtime as needed for muscle spasms  Estradiol 2 mg, 1 tablet once daily  Medroxyprogesterone 2.5 mg, 1 tablet alternating with 2 tablets once daily  Buspirone (Buspar) 15 mg, 0.5 tablet (7.5 mg) twice daily  Sertraline 100 mg, 2 tablets once daily Lamotrigine 100 mg,1/2 tablet in AM and 1 tablet in PM  Azelastine (Astelin) 0.1% nasal spray, 1 spray into both nostrils twice daily  Fluticasone (Flonase) 50 mcg/act nasal spray, 2 sprays into each nostril every day  Cyanocobalamin 1000 mcg/ml, inject 1 ML into muscle every 2 weeks for 2 doses and then once every month  Patient is due for next adherence delivery on: 12/17/2020. Called patient and reviewed medications and coordinated delivery. This delivery to include: Montelukast 10 mg: one tablet at bedtime Levothyroxine 125 mcg: one tablet before breakfast Metformin 500 mg: two tablets every morning and one tablet at bedtime Folic acid 1 mg: one tablet at breakfast and one at  bedtime Methotrexate 2.5 mg, 4 tablets at bedtime on Fridays Ondansetron (Zofran) 4 mg, 1 tablet every eight hours as needed for nausea Methadone 10 mg:one tablet every twelve hours Topiramate 25 mg: one tablet in the morning and three tablets at bedtime  Tizanidine 4 mg: three tablets at bedtime   Estradiol 2 mg:one tablet at breakfast Medroxyprogesterone 2.5 mg, 1 tablet alternating with 2 tablets once daily  Buspirone (Buspar) 15 mg:  tablet at breakfast and  tab at bedtime Sertraline 100 mg: two tablets at breakfast Lamotrigine 100 mg: one tablet at breakfast and one at bedtime Fluticasone (Flonase) 50 mcg/act nasal spray, 2 sprays into each nostril every day  Cyanocobalamin 1000 mcg/ml, inject 1 ML into muscle every 2 weeks for  2 doses and then once every month Fluticasone (CUTIVATE) 0.05 % cream: apply twice daily to needed area  Patient declined the following medications  due to PRN use  Azelastine (Astelin) 0.1% nasal spray, 1 spray into both nostrils twice daily  Sucralfate 1g, 1 tablet four times daily with meals and at bedtime   Metoclopramide 10 mg, 1 tablet four times daily (patient has been using as needed)  She currently does not need refills  Confirmed delivery date of 12/17/2020, advised patient that pharmacy will contact them the morning of delivery.  Star Rating Drugs:  Dispensed Quantity Pharmacy  Metformin 5000 mg tablet 90 [address]   Amilia Revonda Standard, [place] Assistant [phone]

## 2020-12-13 DIAGNOSIS — G894 Chronic pain syndrome: Secondary | ICD-10-CM | POA: Diagnosis not present

## 2020-12-13 DIAGNOSIS — Z79891 Long term (current) use of opiate analgesic: Secondary | ICD-10-CM | POA: Diagnosis not present

## 2020-12-13 DIAGNOSIS — G90512 Complex regional pain syndrome I of left upper limb: Secondary | ICD-10-CM | POA: Diagnosis not present

## 2020-12-13 DIAGNOSIS — M542 Cervicalgia: Secondary | ICD-10-CM | POA: Diagnosis not present

## 2020-12-14 ENCOUNTER — Other Ambulatory Visit: Payer: Self-pay | Admitting: Family Medicine

## 2020-12-16 ENCOUNTER — Other Ambulatory Visit: Payer: Self-pay | Admitting: Orthopedic Surgery

## 2020-12-16 DIAGNOSIS — S60212D Contusion of left wrist, subsequent encounter: Secondary | ICD-10-CM | POA: Diagnosis not present

## 2020-12-16 DIAGNOSIS — M542 Cervicalgia: Secondary | ICD-10-CM | POA: Diagnosis not present

## 2020-12-16 DIAGNOSIS — M25512 Pain in left shoulder: Secondary | ICD-10-CM | POA: Diagnosis not present

## 2020-12-16 DIAGNOSIS — M25532 Pain in left wrist: Secondary | ICD-10-CM

## 2020-12-17 ENCOUNTER — Other Ambulatory Visit: Payer: Self-pay | Admitting: Orthopedic Surgery

## 2020-12-17 DIAGNOSIS — S63592A Other specified sprain of left wrist, initial encounter: Secondary | ICD-10-CM

## 2020-12-17 DIAGNOSIS — M25532 Pain in left wrist: Secondary | ICD-10-CM

## 2020-12-31 ENCOUNTER — Other Ambulatory Visit: Payer: HMO

## 2021-01-06 ENCOUNTER — Telehealth: Payer: Self-pay | Admitting: Cardiovascular Disease

## 2021-01-06 ENCOUNTER — Telehealth: Payer: Self-pay | Admitting: Internal Medicine

## 2021-01-06 NOTE — Telephone Encounter (Signed)
Returned call to patient of Dr. Gwenlyn Found -- her sister/brother-in-law called in on her behalf  She has been having heart palps, chest discomfort, chest heaviness, shortness of breath (for a few consecutive days last week), fatigue (chronic per chart review)  She lost her appetite 1.5-2 months ago She has gone 1-2 weeks without much food She had had diarrhea, nausea, burning up in chest/esophagus  She has visit with GI on 5/3 - couldn't get in sooner  She has lost close to 25lbs She feels weak   She has systemic lupus, rheum arthritis - was advised to contact cardiology to see if this could be affecting her heart  She has had ALL these symptoms for years (prior to [facility]) but her lupus got so bad, she never came in  She is leery of going to ED b/c of weakened immune system d/t to being on [facility])   Advised patient no soon visits with MD (unless there is room to [facility]) or cancellations  Will route message to MD/RN

## 2021-01-06 NOTE — Telephone Encounter (Signed)
Pt is requesting a call back from a nurse to discuss her abdominal pain/nausea/diarrhea, pt would like to be seen ASAP, first available is 5/3.

## 2021-01-06 NOTE — Telephone Encounter (Signed)
There are APP appts this week.  Please offer her one.

## 2021-01-06 NOTE — Telephone Encounter (Signed)
Pt c/o of Chest Pain: STAT if CP now or developed within 24 hours  1. Are you having CP right now? no  2. Are you experiencing any other symptoms (ex. SOB, nausea, vomiting, sweating)? Sob, nausea, vomiting  3. How long have you been experiencing CP? Couple   4. Is your CP continuous or coming and going? conitnuous  5. Have you taken Nitroglycerin? No  Patient stated that there is heart burn in the upper chest. ?

## 2021-01-07 ENCOUNTER — Emergency Department (HOSPITAL_COMMUNITY)
Admission: EM | Admit: 2021-01-07 | Discharge: 2021-01-07 | Disposition: A | Discharge status: Home / Self Care | Payer: PPO | Attending: Emergency Medicine | Admitting: Emergency Medicine

## 2021-01-07 ENCOUNTER — Telehealth: Payer: Self-pay | Admitting: Pharmacist

## 2021-01-07 ENCOUNTER — Encounter (HOSPITAL_COMMUNITY): Payer: Self-pay | Admitting: Emergency Medicine

## 2021-01-07 ENCOUNTER — Emergency Department (HOSPITAL_COMMUNITY): Payer: PPO

## 2021-01-07 DIAGNOSIS — R1084 Generalized abdominal pain: Secondary | ICD-10-CM | POA: Diagnosis not present

## 2021-01-07 DIAGNOSIS — R197 Diarrhea, unspecified: Secondary | ICD-10-CM | POA: Diagnosis not present

## 2021-01-07 DIAGNOSIS — J449 Chronic obstructive pulmonary disease, unspecified: Secondary | ICD-10-CM | POA: Diagnosis not present

## 2021-01-07 DIAGNOSIS — E039 Hypothyroidism, unspecified: Secondary | ICD-10-CM | POA: Insufficient documentation

## 2021-01-07 DIAGNOSIS — R112 Nausea with vomiting, unspecified: Secondary | ICD-10-CM | POA: Insufficient documentation

## 2021-01-07 DIAGNOSIS — R69 Illness, unspecified: Secondary | ICD-10-CM | POA: Diagnosis not present

## 2021-01-07 DIAGNOSIS — R109 Unspecified abdominal pain: Secondary | ICD-10-CM | POA: Diagnosis not present

## 2021-01-07 DIAGNOSIS — R9431 Abnormal electrocardiogram [ECG] [EKG]: Secondary | ICD-10-CM | POA: Diagnosis not present

## 2021-01-07 DIAGNOSIS — R531 Weakness: Secondary | ICD-10-CM | POA: Diagnosis not present

## 2021-01-07 DIAGNOSIS — Z7984 Long term (current) use of oral hypoglycemic drugs: Secondary | ICD-10-CM | POA: Diagnosis not present

## 2021-01-07 DIAGNOSIS — R1110 Vomiting, unspecified: Secondary | ICD-10-CM | POA: Diagnosis not present

## 2021-01-07 DIAGNOSIS — J45909 Unspecified asthma, uncomplicated: Secondary | ICD-10-CM | POA: Insufficient documentation

## 2021-01-07 DIAGNOSIS — R5381 Other malaise: Secondary | ICD-10-CM | POA: Diagnosis not present

## 2021-01-07 DIAGNOSIS — Z79899 Other long term (current) drug therapy: Secondary | ICD-10-CM | POA: Diagnosis not present

## 2021-01-07 LAB — CBC WITH DIFFERENTIAL/PLATELET
Abs Immature Granulocytes: 0.02 10*3/uL (ref 0.00–0.07)
Basophils Absolute: 0 10*3/uL (ref 0.0–0.1)
Basophils Relative: 1 %
Eosinophils Absolute: 0 10*3/uL (ref 0.0–0.5)
Eosinophils Relative: 1 %
HCT: 40.1 % (ref 36.0–46.0)
Hemoglobin: 13.2 g/dL (ref 12.0–15.0)
Immature Granulocytes: 0 %
Lymphocytes Relative: 27 %
Lymphs Abs: 1.4 10*3/uL (ref 0.7–4.0)
MCH: 31.2 pg (ref 26.0–34.0)
MCHC: 32.9 g/dL (ref 30.0–36.0)
MCV: 94.8 fL (ref 80.0–100.0)
Monocytes Absolute: 0.3 10*3/uL (ref 0.1–1.0)
Monocytes Relative: 5 %
Neutro Abs: 3.4 10*3/uL (ref 1.7–7.7)
Neutrophils Relative %: 66 %
Platelets: 288 10*3/uL (ref 150–400)
RBC: 4.23 MIL/uL (ref 3.87–5.11)
RDW: 12.5 % (ref 11.5–15.5)
WBC: 5.1 10*3/uL (ref 4.0–10.5)
nRBC: 0 % (ref 0.0–0.2)

## 2021-01-07 LAB — URINALYSIS, ROUTINE W REFLEX MICROSCOPIC
Bilirubin Urine: NEGATIVE
Glucose, UA: NEGATIVE mg/dL
Hgb urine dipstick: NEGATIVE
Ketones, ur: 20 mg/dL — AB
Nitrite: NEGATIVE
Protein, ur: NEGATIVE mg/dL
Specific Gravity, Urine: 1.012 (ref 1.005–1.030)
pH: 7 (ref 5.0–8.0)

## 2021-01-07 LAB — COMPREHENSIVE METABOLIC PANEL
ALT: 9 U/L (ref 0–44)
AST: 16 U/L (ref 15–41)
Albumin: 4.3 g/dL (ref 3.5–5.0)
Alkaline Phosphatase: 42 U/L (ref 38–126)
Anion gap: 12 (ref 5–15)
BUN: 8 mg/dL (ref 6–20)
CO2: 20 mmol/L — ABNORMAL LOW (ref 22–32)
Calcium: 9.7 mg/dL (ref 8.9–10.3)
Chloride: 106 mmol/L (ref 98–111)
Creatinine, Ser: 0.98 mg/dL (ref 0.44–1.00)
GFR, Estimated: >60 mL/min (ref >60)
Glucose, Bld: 97 mg/dL (ref 70–99)
Potassium: 3.9 mmol/L (ref 3.5–5.1)
Sodium: 138 mmol/L (ref 135–145)
Total Bilirubin: 0.6 mg/dL (ref 0.3–1.2)
Total Protein: 7 g/dL (ref 6.5–8.1)

## 2021-01-07 LAB — LIPASE, BLOOD: Lipase: 25 U/L (ref 11–51)

## 2021-01-07 MED ORDER — IOHEXOL 300 MG/ML  SOLN
100.0000 mL | Freq: Once | INTRAMUSCULAR | Status: AC | PRN
  Administered 2021-01-07: 100 mL via INTRAVENOUS

## 2021-01-07 MED ORDER — SUCRALFATE 1 G PO TABS: 1.0000 g | ORAL_TABLET | Freq: Three times a day (TID) | ORAL | 0 refills | Status: DC

## 2021-01-07 MED ORDER — SODIUM CHLORIDE 0.9 % IV BOLUS
1000.0000 mL | Freq: Once | INTRAVENOUS | Status: AC
  Administered 2021-01-07: 1000 mL via INTRAVENOUS

## 2021-01-07 MED ORDER — ONDANSETRON HCL 4 MG/2ML IJ SOLN
4.0000 mg | Freq: Once | INTRAMUSCULAR | Status: AC
  Administered 2021-01-07: 4 mg via INTRAVENOUS
  Filled 2021-01-07: qty 2

## 2021-01-07 MED ORDER — PROMETHAZINE HCL 25 MG PO TABS: 25.0000 mg | ORAL_TABLET | Freq: Three times a day (TID) | ORAL | 0 refills | Status: DC | PRN

## 2021-01-07 NOTE — Chronic Care Management (AMB) (Signed)
Chronic Care Management Pharmacy Assistant   Name: Jo [name]  MRN: [id number] DOB: 1961-08-31  Reason for Encounter: Medication Review   Recent office visits:  None  Recent consult visits:  None  Hospital visits:  Medication Reconciliation was completed by comparing discharge summary, patient's EMR and Pharmacy list, and upon discussion with patient.  Admitted to the hospital on 01/07/2021 due to Abdominal Pain. Discharge date was 01/07/2021. Discharged from [place]?Medications Started at [facility] Discharge:?? -started the following medication due to Abdominal pain and nausea. Promethazine HCl 25 mg Oral Every 8 hours PRN  Medication Changes at Hospital Discharge: -Changed  Sucralfate 1 g Oral 3 times daily with meals & bedtime  Patient taking differently:  Take 1 g by mouth 3 (three) times daily as needed (indigestion). Medications Discontinued at Hospital Discharge: None  Medications that remain the same after Hospital Discharge:??  -All other medications will remain the same.    Medications: Outpatient Encounter Medications as of 01/07/2021  Medication Sig Note  . azelastine (ASTELIN) 0.1 % nasal spray Place 1 spray into both nostrils 2 (two) times daily. Use in each nostril as directed   . Belimumab (BENLYSTA) 200 MG/ML SOSY 1 ml   . busPIRone (BUSPAR) 15 MG tablet TAKE 1/2 TABLET BY MOUTH AT BREAKFAST AND AT BEDTIME   . cholecalciferol (VITAMIN D) 1000 UNITS tablet Take 2,000 Units by mouth daily.   . clindamycin (CLEOCIN T) 1 % lotion Apply 1 application topically 2 (two) times daily.    . Cranberry 200 MG CAPS Take 200 mg by mouth 2 (two) times daily. 09/25/2015: Received from: [place]: Take by mouth.  . cyanocobalamin (,VITAMIN B-12,) 1000 MCG/ML injection INJECT ONE ML INTRAMUSCULARLY FOR ONE DOSE ALTERNATING EVERY TWO WEEKS, THEN MONHTLY   . erythromycin with ethanol (EMGEL) 2 % gel Apply topically 2 (two)  times daily.   Marland Kitchen estradiol (ESTRACE) 2 MG tablet TAKE ONE TABLET BY MOUTH EVERY MORNING   . fexofenadine-pseudoephedrine (ALLEGRA-D 24) 180-240 MG 24 hr tablet Take 1 tablet by mouth at bedtime.   . fluticasone (CUTIVATE) 0.05 % cream Apply 1 application topically 2 (two) times daily as needed (rash).    . fluticasone (FLONASE) 50 MCG/ACT nasal spray Place 2 sprays into both nostrils daily.   . folic acid (FOLVITE) 1 MG tablet Take 1 tablet (1 mg total) by mouth daily.   Marland Kitchen lamoTRIgine (LAMICTAL) 100 MG tablet Take 1 tablet (100 mg total) by mouth 2 (two) times daily. (Patient taking differently: Take 100 mg by mouth 2 (two) times daily. Take 1/2 tab PO in the morning and 1 tablet at HS)   . levothyroxine (SYNTHROID) 125 MCG tablet TAKE 1 TABLET BY MOUTH EVERY DAY   . medroxyPROGESTERone (PROVERA) 2.5 MG tablet TAKE 1 TABLET ALTERNATING WITH 2 TABLETS DAILY   . metFORMIN (GLUCOPHAGE) 500 MG tablet Take 2 tabs q am, 1 tab qhs.   . methadone (DOLOPHINE) 10 MG tablet Take 1-2 tablets (10-20 mg total) by mouth in the morning, at noon, and at bedtime. Patient reports taking, 2 tablets in the morning, 2 tablets around noon, and 1 tablet at bedtime.   . methotrexate (RHEUMATREX) 2.5 MG tablet Take 4 tablets (10 mg total) by mouth once a week.   . Metoclopramide HCl 10 MG TBDP Take 1 tablet (10 mg total) by mouth 4 (four) times daily.   . montelukast (SINGULAIR) 10 MG tablet TAKE 1 TABLET BY  MOUTH EVERYDAY AT BEDTIME   . nystatin cream (MYCOSTATIN) Apply 1 application topically 2 (two) times daily.   [place] Bicarbonate (ZEGERID) 20-1100 MG CAPS capsule Take 1 capsule by mouth daily.   . ondansetron (ZOFRAN) 4 MG tablet Take 1 tablet (4 mg total) by mouth every 8 (eight) hours as needed for nausea.   . sertraline (ZOLOFT) 100 MG tablet TAKE TWO TABLETS BY MOUTH EVERY MORNING   . sucralfate (CARAFATE) 1 g tablet Take 1 tablet (1 g total) by mouth 4 (four) times daily -  with meals and at bedtime.  (Patient taking differently: Take 1 g by mouth 3 (three) times daily as needed (indigestion).)   . Syringe/Needle, Disp, (SYRINGE 3CC/25GX1-1/2") 25G X 1-1/2" 3 ML MISC Inject 1cc (1045mcg) B12 q 2-4 weeks as directed   . tapentadol (NUCYNTA ER) 50 MG 12 hr tablet Take 50 mg by mouth every 12 (twelve) hours.   Marland Kitchen tiZANidine (ZANAFLEX) 4 MG capsule Take 3 capsules (12 mg total) by mouth at bedtime as needed for muscle spasms.   Marland Kitchen topiramate (TOPAMAX) 25 MG tablet TAKE 1 TABLET 5 TIMES A DAY   . triamcinolone cream (KENALOG) 0.1 %     Facility-Administered Encounter Medications as of 01/07/2021  Medication  . cyanocobalamin ((VITAMIN B-12)) injection 1,000 mcg    Reviewed chart for medication changes ahead of medication coordination call.  BP Readings from Last 3 Encounters:  07/17/20 117/70  05/15/20 110/84  12/27/19 105/66    Lab Results  Component Value Date   HGBA1C 5.6 11/15/2019     Patient obtains medications through Adherence Packaging  90 Days  Last adherence delivery included: Marland Kitchen Montelukast 10 mg: one tablet at bedtime . Levothyroxine 153mcg: one tablet before breakfast . Metformin 500mg : two tablets every morning and one tablet at bedtime . Folic acid 1 mg: one tablet at breakfast and one at bedtime . Methotrexate 2.5mg , 4 tablets on Friday at bedtime . Ondansetron (Zofran) 4mg , 1 tablet every eight hours as needed for nausea . Methadone 10mg :one tablet every twelve hours . Topiramate 25mg : one tablet in the morning and three tablets at bedtime  . Tizanidine 4mg : three tablets at bedtime   . Estradiol 2mg :one at breakfast . Medroxyprogesterone 2.5mg , 1 tablet on M,W, and F and 2 tablets on T, TH, S before breakfast . Buspirone (Buspar) 15mg :  tablet at breakfast and  tab at bedtime . Sertraline 100mg : two tablets at breakfast . Lamotrigine 100mg : one table at breakfast and one at bedtime . Fluticasone (Flonase) 37mcg/act nasal spray, 2 sprays into each nostril every  day  . Cyanocobalamin 1000 mcg/ml, inject 1 ML into muscle every 2 weeks for 2 doses and then once every month . Fluticasone (CUTIVATE) 0.05 % cream: apply twice daily to needed area  Patient declined the following medicaiton last month due to PRN use. . Azelastine (Astelin) 0.1% nasal spray, 1 spray into both nostrils twice daily . Sucralfate 1g, 1 tablet four times daily with meals and at bedtime  . Metoclopramide 10mg , 1 tablet four times daily (patient has been using as needed)  I spoke with the patient and reviewed medications. There are no changes in medications currently. The patient declined these medications this month due to PRN use/additional supply on hand. The patient is taking the following medications: 90 day supply was filled on 03.26.2022 .  Montelukast 10 mg: one tablet at bedtime . Levothyroxine 164mcg: one tablet before breakfast . Metformin 500mg : two tablets every morning and one  tablet at bedtime . Folic acid 1 mg: one tablet at breakfast and one at bedtime . Methotrexate 2.5mg , 4 tablets on Friday at bedtime . Ondansetron (Zofran) 4mg , 1 tablet every eight hours as needed for nausea . Methadone 10mg :one tablet every twelve hours . Topiramate 25mg : one tablet in the morning and three tablets at bedtime  . Tizanidine 4mg : three tablets at bedtime   . Estradiol 2mg :one at breakfast . Medroxyprogesterone 2.5mg , 1 tablet on M,W, and F and 2 tablets on T, TH, S before breakfast . Buspirone (Buspar) 15mg :  tablet at breakfast and  tab at bedtime . Sertraline 100mg : two tablets at breakfast . Lamotrigine 100mg : one table at breakfast and one at bedtime . Fluticasone (Flonase) 36mcg/act nasal spray, 2 sprays into each nostril every day  . Cyanocobalamin 1000 mcg/ml, inject 1 ML into muscle every 2 weeks for 2 doses and then once every month . Fluticasone (CUTIVATE) 0.05 % cream: apply twice daily to needed area  She currently does not need refills.  Star Rating Drugs:   Dispensed Quantity Pharmacy  Metformin 500 mg 03.26.2022 [address]   [address] [place], [place] [phone]

## 2021-01-07 NOTE — ED Notes (Signed)
Patient given discharge paperwork and prescriptions. Verbalized understanding of teaching. IV d/c with cath tip intact. Wheeled to exit in NAD.

## 2021-01-07 NOTE — Telephone Encounter (Signed)
Called patient, able to get patient in on May 18th with Almyra Deforest, [place]- patient advised to seek immediate care if symptoms worsen.  Patient verbalized understanding. Will notify MD of update.  Thanks!

## 2021-01-07 NOTE — Telephone Encounter (Signed)
I last spoke to her 4/20 on a virtual visit. Zio patch showed only occasional PACs/PVCs and CCS was 0. No further eval after that. She can see an APP back at next open appt.  JJB

## 2021-01-07 NOTE — ED Triage Notes (Signed)
Emergency Medicine Provider Triage Evaluation Note  Jo [name] , a 60 y.o. female  was evaluated in triage.  Pt complains of abdominal pain, nasuea, vomiting, diarrhea for a month. Hx of lupus. States that she is very weak.   Review of Systems  Positive: N,v,abdominal pain Negative: fever  Physical Exam  There were no vitals taken for this visit. Gen:   Tearful HEENT:  Atraumatic  Resp:  Normal effort  Cardiac:  Normal rate  Abd:   Nondistended, nontender MSK:   Moves extremities without difficulty Neuro:  Speech clear   Medical Decision Making  Medically screening exam initiated at 1:52 PM.  Appropriate orders placed.  Jo [name] was informed that the remainder of the evaluation will be completed by another provider, this initial triage assessment does not replace that evaluation, and the importance of remaining in the ED until their evaluation is complete.  Clinical Impression  Stable   MSE was initiated and I personally evaluated the patient and placed orders (if any) at  1:56 PM on January 07, 2021.  The patient appears stable so that the remainder of the MSE may be completed by another provider.    Alfredia Client, PA-C 01/07/21 1356

## 2021-01-07 NOTE — ED Provider Notes (Signed)
[facility] EMERGENCY DEPARTMENT Provider Note   CSN: [id number] Arrival date & time: 01/07/21  1337     History Chief Complaint  Patient presents with  . Abdominal Pain    Jo [name] is a 60 y.o. female.  The history is provided by the patient.  Abdominal Pain Pain location:  Generalized Pain quality: not aching   Pain radiates to:  Does not radiate Pain severity:  Mild Onset quality:  Gradual Timing:  Intermittent Progression:  Waxing and waning Chronicity:  Recurrent Relieved by:  Nothing Worsened by:  Nothing Associated symptoms: diarrhea, nausea and vomiting   Associated symptoms: no chest pain, no chills, no cough, no dysuria, no fever, no hematuria, no shortness of breath and no sore throat   Risk factors: multiple surgeries        Past Medical History:  Diagnosis Date  . Allergy   . Anemia   . Anxiety   . Anxiety and depression   . Arthritis   . Asthma   . Chronic headaches   . Colon polyp   . COPD (chronic obstructive pulmonary disease) (HCC)    no o2  . Depression   . Fundic gland polyps of stomach, benign   . Gastritis   . GERD (gastroesophageal reflux disease)   . H. pylori infection 2013  . H/O gastric ulcer 1985  . History of anorexia nervosa   . History of bulimia   . History of pneumonia   . Hypothyroidism   . Irritable bowel syndrome (IBS)   . Obesity   . Personal history of other mental and behavioral disorders 01/14/2008   Qualifier: Diagnosis of  By: Carlean Purl MD, [name]   Overview:  Overview:  Annotation: BULIMIA Qualifier: Diagnosis of  By: Bertram Gala   . REFLEX SYMPATHETIC DYSTROPHY 02/26/2009   Qualifier: Diagnosis of  By: Carlean Purl MD, Tonna Boehringer E   . RSD (reflex sympathetic dystrophy)   . RSD (reflex sympathetic dystrophy)   . Ulcer    gastric, duodenal ulcer    Patient Active Problem List   Diagnosis Date Noted  . Depression, recurrent ([place]) 07/22/2020  . Systemic lupus erythematosus  ([place]) 04/29/2020  . Iron deficiency 12/13/2019  . Reflex sympathetic dystrophy of lower extremity 06/12/2019  . Disorder of thyroid gland 06/12/2019  . Falls 09/25/2015  . Musculoskeletal neck pain 09/25/2015  . Headache 09/25/2015  . Memory changes 09/25/2015  . Blurry vision 09/25/2015  . Neck pain 09/25/2015  . Polycystic ovaries 07/11/2013  . Hypothyroidism 05/04/2013  . Complex regional pain syndrome I, unspecified 03/06/2013  . Extremity pain 03/06/2013  . Helicobacter pylori infection 12/22/2012  . History of colonic polyps 02/25/2009  . OBESITY 01/14/2008  . DEPRESSION/ANXIETY 01/14/2008  . Asthma 01/14/2008  . GERD 01/14/2008  . Irritable colon 01/14/2008    Past Surgical History:  Procedure Laterality Date  . CERVICAL SPINE SURGERY  1990  . CERVIX SURGERY    . CHOLECYSTECTOMY    . COLONOSCOPY    . KNEE ARTHROSCOPY  04/21/2012   right  . LAPAROSCOPIC NISSEN FUNDOPLICATION  [id number]   Dr. Johnathan Hausen  . lumbar spondylosis injected  2013   Dr. Mina Marble  . NASAL SINUS SURGERY    . THORACOSCOPY W/ THORACIC SYMPATHECTOMY     for RSD  . TONSILLECTOMY    . UPPER GASTROINTESTINAL ENDOSCOPY       OB History   No obstetric history on file.     Family History  Problem Relation Age of Onset  . Colon polyps Mother        part of intestines removed  . Raynaud syndrome Mother   . Irritable bowel syndrome Mother   . Other Mother        cold agluten  . Stroke Mother   . Colon polyps Brother   . Kidney Stones Brother   . Breast cancer Maternal Aunt   . Lung cancer Maternal Grandfather   . Colon cancer Neg Hx   . Esophageal cancer Neg Hx   . Rectal cancer Neg Hx   . Stomach cancer Neg Hx   . Migraines Neg Hx   . Seizures Neg Hx     Social History   Tobacco Use  . Smoking status: Never Smoker  . Smokeless tobacco: Never Used  Substance Use Topics  . Alcohol use: No  . Drug use: No    Home Medications Prior to Admission medications   Medication Sig  Start Date End Date Taking? Authorizing Provider  promethazine (PHENERGAN) 25 MG tablet Take 1 tablet (25 mg total) by mouth every 8 (eight) hours as needed for up to 15 doses for nausea or vomiting. 01/07/21  Yes Juwaun Inskeep, DO  sucralfate (CARAFATE) 1 g tablet Take 1 tablet (1 g total) by mouth 4 (four) times daily -  with meals and at bedtime for 14 days. 01/07/21 01/21/21 Yes Riddick Nuon, DO  azelastine (ASTELIN) 0.1 % nasal spray Place 1 spray into both nostrils 2 (two) times daily. Use in each nostril as directed 07/12/19   Koberlein, Steele Berg, MD  Belimumab (BENLYSTA) 200 MG/ML SOSY 1 ml 06/18/20   [provider]  busPIRone (BUSPAR) 15 MG tablet TAKE 1/2 TABLET BY MOUTH AT BREAKFAST AND AT BEDTIME 12/06/20   Koberlein, Steele Berg, MD  cholecalciferol (VITAMIN D) 1000 UNITS tablet Take 2,000 Units by mouth daily.    [provider]  clindamycin (CLEOCIN T) 1 % lotion Apply 1 application topically 2 (two) times daily.  10/31/19   [provider]  Cranberry 200 MG CAPS Take 200 mg by mouth 2 (two) times daily.    [provider]  cyanocobalamin (,VITAMIN B-12,) 1000 MCG/ML injection INJECT ONE ML INTRAMUSCULARLY FOR ONE DOSE ALTERNATING EVERY TWO WEEKS, THEN MONHTLY 12/16/20   Koberlein, Andris Flurry C, MD  erythromycin with ethanol (EMGEL) 2 % gel Apply topically 2 (two) times daily. 05/25/18   Koberlein, Steele Berg, MD  estradiol (ESTRACE) 2 MG tablet TAKE ONE TABLET BY MOUTH EVERY MORNING 12/06/20   Koberlein, Steele Berg, MD  fexofenadine-pseudoephedrine (ALLEGRA-D 24) 180-240 MG 24 hr tablet Take 1 tablet by mouth at bedtime.    [provider]  fluticasone (CUTIVATE) 0.05 % cream Apply 1 application topically 2 (two) times daily as needed (rash).  10/31/19   [provider]  fluticasone (FLONASE) 50 MCG/ACT nasal spray Place 2 sprays into both nostrils daily. 09/09/20   Caren Macadam, MD  folic acid (FOLVITE) 1 MG tablet Take 1 tablet (1 mg total) by  mouth daily. 12/13/19   Gardenia Phlegm, NP  lamoTRIgine (LAMICTAL) 100 MG tablet Take 1 tablet (100 mg total) by mouth 2 (two) times daily. Patient taking differently: Take 100 mg by mouth 2 (two) times daily. Take 1/2 tab PO in the morning and 1 tablet at [facility] 01/26/20   Koberlein, Junell C, MD  levothyroxine (SYNTHROID) 125 MCG tablet TAKE 1 TABLET BY MOUTH EVERY DAY 03/18/20   Caren Macadam, MD  medroxyPROGESTERone (PROVERA) 2.5 MG tablet TAKE 1 TABLET ALTERNATING WITH 2 TABLETS DAILY 03/07/20   Caren Macadam, MD  metFORMIN (GLUCOPHAGE) 500 MG tablet Take 2 tabs q am, 1 tab qhs. 01/26/20   Koberlein, Steele Berg, MD  methadone (DOLOPHINE) 10 MG tablet Take 1-2 tablets (10-20 mg total) by mouth in the morning, at noon, and at bedtime. Patient reports taking, 2 tablets in the morning, 2 tablets around noon, and 1 tablet at bedtime. 05/15/20   Caren Macadam, MD  methotrexate (RHEUMATREX) 2.5 MG tablet Take 4 tablets (10 mg total) by mouth once a week. 07/17/20   Caren Macadam, MD  Metoclopramide HCl 10 MG TBDP Take 1 tablet (10 mg total) by mouth 4 (four) times daily. 01/26/20   Koberlein, Steele Berg, MD  montelukast (SINGULAIR) 10 MG tablet TAKE 1 TABLET BY MOUTH EVERYDAY AT BEDTIME 01/26/20   Koberlein, Junell C, MD  nystatin cream (MYCOSTATIN) Apply 1 application topically 2 (two) times daily. 05/25/18   Caren Macadam, MD  Omeprazole-Sodium Bicarbonate (ZEGERID) 20-1100 MG CAPS capsule Take 1 capsule by mouth daily. 07/12/19   Koberlein, Steele Berg, MD  ondansetron (ZOFRAN) 4 MG tablet Take 1 tablet (4 mg total) by mouth every 8 (eight) hours as needed for nausea. 01/26/20   Koberlein, Steele Berg, MD  sertraline (ZOLOFT) 100 MG tablet TAKE TWO TABLETS BY MOUTH EVERY MORNING 12/06/20   Koberlein, Steele Berg, MD  sucralfate (CARAFATE) 1 g tablet Take 1 tablet (1 g total) by mouth 4 (four) times daily -  with meals and at bedtime. Patient taking differently: Take 1 g by mouth 3 (three) times  daily as needed (indigestion). 07/12/19   Koberlein, Steele Berg, MD  Syringe/Needle, Disp, (SYRINGE 3CC/25GX1-1/2") 25G X 1-1/2" 3 ML MISC Inject 1cc (1067mcg) B12 q 2-4 weeks as directed 03/20/20   Caren Macadam, MD  tapentadol (NUCYNTA ER) 50 MG 12 hr tablet Take 50 mg by mouth every 12 (twelve) hours.    [provider]  tiZANidine (ZANAFLEX) 4 MG capsule Take 3 capsules (12 mg total) by mouth at bedtime as needed for muscle spasms. 04/26/20   Caren Macadam, MD  topiramate (TOPAMAX) 25 MG tablet TAKE 1 TABLET 5 TIMES A DAY 04/26/20   Micheline Rough C, MD  triamcinolone cream (KENALOG) 0.1 %  01/02/20   [provider]    Allergies    Bee pollen, Diazepam, Prednisone, Amoxicillin, Doxycycline, Erythromycin, Morphine, Propoxyphene n-acetaminophen, Sulfa antibiotics, Tetracyclines & related, and Propoxyphene  Review of Systems   Review of Systems  Constitutional: Negative for chills and fever.  HENT: Negative for ear pain and sore throat.   Eyes: Negative for pain and visual disturbance.  Respiratory: Negative for cough and shortness of breath.   Cardiovascular: Negative for chest pain and palpitations.  Gastrointestinal: Positive for abdominal pain, diarrhea, nausea and vomiting.  Genitourinary: Negative for dysuria and hematuria.  Musculoskeletal: Negative for arthralgias and back pain.  Skin: Negative for color change and rash.  Neurological: Negative for seizures and syncope.  All other systems reviewed and are negative.   Physical Exam Updated Vital Signs  ED Triage Vitals  Enc Vitals Group     BP 01/07/21 1354 115/71     Pulse Rate 01/07/21 1354 96     Resp 01/07/21 1354 18     Temp 01/07/21 1354 98.7 F (37.1 C)     Temp src --      SpO2 01/07/21 1354 100 %  Weight --      Height --      Head Circumference --      Peak Flow --      Pain Score 01/07/21 1355 5     Pain Loc --      Pain Edu? --      Excl. in [place]? --     Physical  Exam Vitals and nursing note reviewed.  Constitutional:      General: She is not in acute distress.    Appearance: She is well-developed. She is not ill-appearing.  HENT:     Head: Normocephalic and atraumatic.     Mouth/Throat:     Mouth: Mucous membranes are moist.  Eyes:     Extraocular Movements: Extraocular movements intact.     Conjunctiva/sclera: Conjunctivae normal.     Pupils: Pupils are equal, round, and reactive to light.  Cardiovascular:     Rate and Rhythm: Normal rate and regular rhythm.     Heart sounds: Normal heart sounds. No murmur heard.   Pulmonary:     Effort: Pulmonary effort is normal. No respiratory distress.     Breath sounds: Normal breath sounds.  Abdominal:     General: Abdomen is flat.     Palpations: Abdomen is soft.     Tenderness: There is generalized abdominal tenderness. There is no guarding or rebound.  Musculoskeletal:     Cervical back: Neck supple.  Skin:    General: Skin is warm and dry.     Capillary Refill: Capillary refill takes less than 2 seconds.  Neurological:     Mental Status: She is alert.     ED Results / Procedures / Treatments   Labs (all labs ordered are listed, but only abnormal results are displayed) Labs Reviewed  COMPREHENSIVE METABOLIC PANEL - Abnormal; Notable for the following components:      Result Value   CO2 20 (*)    All other components within normal limits  URINALYSIS, ROUTINE W REFLEX MICROSCOPIC - Abnormal; Notable for the following components:   APPearance HAZY (*)    Ketones, ur 20 (*)    Leukocytes,Ua LARGE (*)    Bacteria, UA FEW (*)    All other components within normal limits  CBC WITH DIFFERENTIAL/PLATELET  LIPASE, BLOOD    EKG EKG Interpretation  Date/Time:  Tuesday January 07 2021 14:02:50 EDT Ventricular Rate:  85 PR Interval:  142 QRS Duration: 88 QT Interval:  366 QTC Calculation: 435 R Axis:   30 Text Interpretation: Normal sinus rhythm Normal ECG Confirmed by Lennice Sites  ([id number]) on 01/07/2021 4:13:08 PM   Radiology CT ABDOMEN PELVIS W CONTRAST  Result Date: 01/07/2021 CLINICAL DATA:  Abdominal pain with nausea and vomiting. EXAM: CT ABDOMEN AND PELVIS WITH CONTRAST TECHNIQUE: Multidetector CT imaging of the abdomen and pelvis was performed using the standard protocol following bolus administration of intravenous contrast. CONTRAST:  133mL OMNIPAQUE IOHEXOL 300 MG/ML  SOLN COMPARISON:  CT abdomen pelvis dated December 11, 2019. FINDINGS: Lower chest: No acute abnormality. Hepatobiliary: No focal liver abnormality. Status post cholecystectomy. Unchanged mild intra- and extrahepatic biliary dilatation likely related to reservoir effect. Pancreas: Unchanged moderate atrophy. No ductal dilatation or surrounding inflammatory changes. Spleen: Normal in size without focal abnormality. Adrenals/Urinary Tract: Adrenal glands are unremarkable. Unchanged small left renal cyst. Kidneys are otherwise normal, without renal calculi, focal lesion, or hydronephrosis. Bladder is unremarkable. Stomach/Bowel: Stomach is within normal limits. Appendix appears normal. No evidence of bowel wall thickening, distention, or  inflammatory changes. Vascular/Lymphatic: No significant vascular findings are present. No enlarged abdominal or pelvic lymph nodes. Reproductive: Oval 7 x 9 mm soft tissue density involving the endometrial canal within the lower uterine segment (series 3, image 76). No adnexal mass. Other: No abdominal wall hernia or abnormality. No abdominopelvic ascites. No pneumoperitoneum. Musculoskeletal: No acute or significant osseous findings. IMPRESSION: 1. No acute intra-abdominal process. 2. Oval 7 x 9 mm soft tissue density involving the endometrial canal within the lower uterine segment, potentially a polyp. Follow-up outpatient pelvic ultrasound is recommended. Electronically Signed   By: Titus Dubin M.D.   On: 01/07/2021 18:08    Procedures Procedures   Medications Ordered in  ED Medications  sodium chloride 0.9 % bolus 1,000 mL (0 mLs Intravenous Stopped 01/07/21 1927)  ondansetron (ZOFRAN) injection 4 mg (4 mg Intravenous Given 01/07/21 1656)  iohexol (OMNIPAQUE) 300 MG/ML solution 100 mL (100 mLs Intravenous Contrast Given 01/07/21 1725)    ED Course  I have reviewed the triage vital signs and the nursing notes.  Pertinent labs & imaging results that were available during my care of the patient were reviewed by me and considered in my medical decision making (see chart for details).    MDM Rules/Calculators/A&P                          Jo [name] is a 60 year old female with history of irritable bowel syndrome, anxiety, possibly lupus who presents to the ED with abdominal pain.  Lab work shows no significant anemia, electrolyte abnormality, kidney injury.  No UTI.  CT scan showed no bowel obstruction or colitis or appendicitis.  Overall suspect acute on chronic abdominal pain.  Suspect IBS flare.  Felt better after IV fluids and Zofran.  Will prescribe Carafate and Phenergan.  Discharged in good condition.  Recommend follow-up with GI.  This chart was dictated using voice recognition software.  Despite best efforts to proofread,  errors can occur which can change the documentation meaning.    Final Clinical Impression(s) / ED Diagnoses Final diagnoses:  Generalized abdominal pain    Rx / DC Orders ED Discharge Orders         Ordered    sucralfate (CARAFATE) 1 g tablet  3 times daily with meals & bedtime        01/07/21 1957    promethazine (PHENERGAN) 25 MG tablet  Every 8 hours PRN        01/07/21 1957           Lennice Sites, DO 01/07/21 1959

## 2021-01-07 NOTE — ED Triage Notes (Signed)
Pt here with c/o abd pain along with chronic n/v/d ,

## 2021-01-10 ENCOUNTER — Ambulatory Visit: Payer: PPO | Admitting: Nurse Practitioner

## 2021-01-10 ENCOUNTER — Other Ambulatory Visit: Payer: Self-pay

## 2021-01-10 ENCOUNTER — Other Ambulatory Visit: Payer: PPO

## 2021-01-10 ENCOUNTER — Encounter: Payer: Self-pay | Admitting: Nurse Practitioner

## 2021-01-10 VITALS — BP 108/68 | HR 76 | Ht 63.0 in | Wt 139.0 lb

## 2021-01-10 DIAGNOSIS — R1030 Lower abdominal pain, unspecified: Secondary | ICD-10-CM

## 2021-01-10 DIAGNOSIS — R110 Nausea: Secondary | ICD-10-CM

## 2021-01-10 DIAGNOSIS — R197 Diarrhea, unspecified: Secondary | ICD-10-CM

## 2021-01-10 MED ORDER — PROMETHAZINE HCL 25 MG PO TABS: 25.0000 mg | ORAL_TABLET | Freq: Three times a day (TID) | ORAL | 0 refills | Status: DC | PRN

## 2021-01-10 NOTE — Patient Instructions (Addendum)
If you are age 58 or older, your body mass index should be between 23-30. Your Body mass index is 24.62 kg/m. If this is out of the aforementioned range listed, please consider follow up with your Primary Care Provider.  If you are age 40 or younger, your body mass index should be between 19-25. Your Body mass index is 24.62 kg/m. If this is out of the aformentioned range listed, please consider follow up with your Primary Care Provider.   Your provider has requested that you go to the basement level for lab work before leaving today. Press "B" on the elevator. The lab is located at the first door on the left as you exit the elevator.  Push fluids and eat a bland diet.   Follow up with OBGYN for possible uterine polyp seen on CT.  It was a pleasure to see you today!  Carl Best, NP

## 2021-01-10 NOTE — Progress Notes (Deleted)
01/10/2021 JADESOLA ROSINE [id number] Feb 04, 1961   CHIEF COMPLAINT:  Abdominal pain   HISTORY OF PRESENT ILLNESS: ***         Past Medical History:  Diagnosis Date  . Allergy   . Anemia   . Anxiety   . Anxiety and depression   . Arthritis   . Asthma   . Chronic headaches   . Colon polyp   . COPD (chronic obstructive pulmonary disease) (HCC)    no o2  . Depression   . Fundic gland polyps of stomach, benign   . Gastritis   . GERD (gastroesophageal reflux disease)   . H. pylori infection 2013  . H/O gastric ulcer 1985  . History of anorexia nervosa   . History of bulimia   . History of pneumonia   . Hypothyroidism   . Irritable bowel syndrome (IBS)   . Obesity   . Personal history of other mental and behavioral disorders 01/14/2008   Qualifier: Diagnosis of  By: Carlean Purl MD, [name]   Overview:  Overview:  Annotation: BULIMIA Qualifier: Diagnosis of  By: Bertram Gala   . REFLEX SYMPATHETIC DYSTROPHY 02/26/2009   Qualifier: Diagnosis of  By: Carlean Purl MD, Tonna Boehringer E   . RSD (reflex sympathetic dystrophy)   . RSD (reflex sympathetic dystrophy)   . Ulcer    gastric, duodenal ulcer   Past Surgical History:  Procedure Laterality Date  . CERVICAL SPINE SURGERY  1990  . CERVIX SURGERY    . CHOLECYSTECTOMY    . COLONOSCOPY    . KNEE ARTHROSCOPY  04/21/2012   right  . LAPAROSCOPIC NISSEN FUNDOPLICATION  [id number]   Dr. Johnathan Hausen  . lumbar spondylosis injected  2013   Dr. Mina Marble  . NASAL SINUS SURGERY    . THORACOSCOPY W/ THORACIC SYMPATHECTOMY     for RSD  . TONSILLECTOMY    . UPPER GASTROINTESTINAL ENDOSCOPY      reports that she has never smoked. She has never used smokeless tobacco. She reports that she does not drink alcohol and does not use drugs. family history includes Breast cancer in her maternal aunt; Colon polyps in her brother and mother; Irritable bowel syndrome in her mother; Kidney Stones in her brother; Lung cancer in her  maternal grandfather; Other in her mother; Raynaud syndrome in her mother; Stroke in her mother. Allergies  Allergen Reactions  . Bee Pollen Anaphylaxis  . Diazepam     sucidal depression  . Prednisone Nausea And Vomiting    Per pt can not take oral steroids   . Amoxicillin     Severe diarrhea At high dose   . Doxycycline     Pt does not remember reaction  . Erythromycin     n & v  . Morphine     n & v  . Propoxyphene N-Acetaminophen     n & V  . Sulfa Antibiotics     MIGRAINES  . Tetracyclines & Related     N & v  . Propoxyphene Other (See Comments)      Outpatient Encounter Medications as of 01/10/2021  Medication Sig  . azelastine (ASTELIN) 0.1 % nasal spray Place 1 spray into both nostrils 2 (two) times daily. Use in each nostril as directed  . Belimumab (BENLYSTA) 200 MG/ML SOSY 1 ml  . busPIRone (BUSPAR) 15 MG tablet TAKE 1/2 TABLET BY MOUTH AT BREAKFAST AND AT BEDTIME  . cholecalciferol (VITAMIN D) 1000 UNITS tablet Take  2,000 Units by mouth daily.  . clindamycin (CLEOCIN T) 1 % lotion Apply 1 application topically 2 (two) times daily.   . Cranberry 200 MG CAPS Take 200 mg by mouth 2 (two) times daily.  . cyanocobalamin (,VITAMIN B-12,) 1000 MCG/ML injection INJECT ONE ML INTRAMUSCULARLY FOR ONE DOSE ALTERNATING EVERY TWO WEEKS, THEN MONHTLY  . erythromycin with ethanol (EMGEL) 2 % gel Apply topically 2 (two) times daily.  Marland Kitchen estradiol (ESTRACE) 2 MG tablet TAKE ONE TABLET BY MOUTH EVERY MORNING  . fexofenadine-pseudoephedrine (ALLEGRA-D 24) 180-240 MG 24 hr tablet Take 1 tablet by mouth at bedtime.  . fluticasone (CUTIVATE) 0.05 % cream Apply 1 application topically 2 (two) times daily as needed (rash).   . fluticasone (FLONASE) 50 MCG/ACT nasal spray Place 2 sprays into both nostrils daily.  . folic acid (FOLVITE) 1 MG tablet Take 1 tablet (1 mg total) by mouth daily.  Marland Kitchen lamoTRIgine (LAMICTAL) 100 MG tablet Take 1 tablet (100 mg total) by mouth 2 (two) times daily.  (Patient taking differently: Take 100 mg by mouth 2 (two) times daily. Take 1/2 tab PO in the morning and 1 tablet at HS)  . levothyroxine (SYNTHROID) 125 MCG tablet TAKE 1 TABLET BY MOUTH EVERY DAY  . medroxyPROGESTERone (PROVERA) 2.5 MG tablet TAKE 1 TABLET ALTERNATING WITH 2 TABLETS DAILY  . metFORMIN (GLUCOPHAGE) 500 MG tablet Take 2 tabs q am, 1 tab qhs.  . methadone (DOLOPHINE) 10 MG tablet Take 1-2 tablets (10-20 mg total) by mouth in the morning, at noon, and at bedtime. Patient reports taking, 2 tablets in the morning, 2 tablets around noon, and 1 tablet at bedtime.  . methotrexate (RHEUMATREX) 2.5 MG tablet Take 4 tablets (10 mg total) by mouth once a week.  . Metoclopramide HCl 10 MG TBDP Take 1 tablet (10 mg total) by mouth 4 (four) times daily.  . montelukast (SINGULAIR) 10 MG tablet TAKE 1 TABLET BY MOUTH EVERYDAY AT BEDTIME  . nystatin cream (MYCOSTATIN) Apply 1 application topically 2 (two) times daily.  Earney Navy Bicarbonate (ZEGERID) 20-1100 MG CAPS capsule Take 1 capsule by mouth daily.  . ondansetron (ZOFRAN) 4 MG tablet Take 1 tablet (4 mg total) by mouth every 8 (eight) hours as needed for nausea.  . promethazine (PHENERGAN) 25 MG tablet Take 1 tablet (25 mg total) by mouth every 8 (eight) hours as needed for up to 15 doses for nausea or vomiting.  . sertraline (ZOLOFT) 100 MG tablet TAKE TWO TABLETS BY MOUTH EVERY MORNING  . sucralfate (CARAFATE) 1 g tablet Take 1 tablet (1 g total) by mouth 4 (four) times daily -  with meals and at bedtime. (Patient taking differently: Take 1 g by mouth 3 (three) times daily as needed (indigestion).)  . sucralfate (CARAFATE) 1 g tablet Take 1 tablet (1 g total) by mouth 4 (four) times daily -  with meals and at bedtime for 14 days.  . Syringe/Needle, Disp, (SYRINGE 3CC/25GX1-1/2") 25G X 1-1/2" 3 ML MISC Inject 1cc (1039mcg) B12 q 2-4 weeks as directed  . tapentadol (NUCYNTA ER) 50 MG 12 hr tablet Take 50 mg by mouth every 12 (twelve)  hours.  Marland Kitchen tiZANidine (ZANAFLEX) 4 MG capsule Take 3 capsules (12 mg total) by mouth at bedtime as needed for muscle spasms.  Marland Kitchen topiramate (TOPAMAX) 25 MG tablet TAKE 1 TABLET 5 TIMES A DAY  . triamcinolone cream (KENALOG) 0.1 %    Facility-Administered Encounter Medications as of 01/10/2021  Medication  . cyanocobalamin ((VITAMIN B-12))  injection 1,000 mcg     REVIEW OF SYSTEMS: All other systems reviewed and negative except where noted in the History of Present Illness.  Gen: Denies fever, sweats or chills. No weight loss.  CV: Denies chest pain, palpitations or edema. Resp: Denies cough, shortness of breath of hemoptysis.  GI: Denies heartburn, dysphagia, stomach or lower abdominal pain. No diarrhea or constipation.  GU : Denies urinary burning, blood in urine, increased urinary frequency or incontinence. MS: Denies joint pain, muscles aches or weakness. Derm: Denies rash, itchiness, skin lesions or unhealing ulcers. Psych: Denies depression, anxiety, memory loss, suicidal ideation and confusion. Heme: Denies bruising, bleeding. Neuro:  Denies headaches, dizziness or paresthesias. Endo:  Denies any problems with DM, thyroid or adrenal function.    PHYSICAL EXAM: Ht 5\' 3"  (1.6 m)   Wt 139 lb (63 kg)   BMI 24.62 kg/m  General: Well developed ... in no acute distress. Head: Normocephalic and atraumatic. Eyes:  Sclerae non-icteric, conjunctive pink. Ears: Normal auditory acuity. Mouth: Dentition intact. No ulcers or lesions.  Neck: Supple, no lymphadenopathy or thyromegaly.  Lungs: Clear bilaterally to auscultation without wheezes, crackles or rhonchi. Heart: Regular rate and rhythm. No murmur, rub or gallop appreciated.  Abdomen: Soft, nontender, non distended. No masses. No hepatosplenomegaly. Normoactive bowel sounds x 4 quadrants.  Rectal:  Musculoskeletal: Symmetrical with no gross deformities. Skin: Warm and dry. No rash or lesions on visible extremities. Extremities:  No edema. Neurological: Alert oriented x 4, no focal deficits.  Psychological:  Alert and cooperative. Normal mood and affect.  ASSESSMENT AND PLAN:    CC:  Caren Macadam, MD

## 2021-01-10 NOTE — Progress Notes (Signed)
01/10/2021 AMBAR FLEITAS [id number] 02/12/1961   CHIEF COMPLAINT:  Abdominal pain, nausea and diarrhea   HISTORY OF PRESENT ILLNESS:  Bryer Hirose is a 60 year old female with a complex past medical history including anxiety, depression,  fibromyalgia, COPD, rheumatoid arthritis on Methotrexate, Lupus, iron deficiency, GERD s/p Nissen Fundoplication and an adenomatous colon polyp in 2008.  She presents to our office today accompanied by her sister. She presented to [facility] ED on 4/19/20222 with nausea and generalized abdominal pain. Labs in the ED showed WBC 5.1. Hg 13.2.BUN 8. Cr. 0.98.  Normal LFTs and Lipase level. CTAP without obstruction or inflammatory/infectious process, a 7 x 9 mm endometrial soft density/possible polyp was identified. She received Zofran and IV fluids. IBS flare was suspected and she prescribed Carafate and Phenergan and she was discharged home. She present today for further GI follow up. She complains of having intermittent nausea and diarrhea for the past 6 months which has progressively worsened over the past 4 months. She has constant nausea with occasional dry heaves, no vomiting. She is passing nonbloody brown watery diarrhea 3 to 4 times daily. No melena. No lower abdominal pain. She complain of having LUQ pain which started 7 days ago. She feels full easily. She is taking Zegrid 20mg  daily and Carafate tid. On Methadone daily. She reports losing 45 lbs over the past 6 months, the last 25 lbs of weight loss in the past 4 weeks. No dysphagia but feels she has occasional esophageal spasms. She took Zofran 8mg  Q 6 hrs without improvement so she is no longer taking it.  She is now taking Phenergan 25mg  one po Q 8 hours which is controlling her nausea. She requests a refill of this medication. Her most recent EGD was 04/05/2017 which showed evidence of a prior Nissen fundoplication with intact wrap, multiple gastric polyps and the esophagus was dilated. Her most recent   colonoscopy was 08/31/2012 which was normal, 10 year recall recommended. Prior adenomas per colonoscopy in 2008. She was having palpitations and chest pain, scheduled to see cardiology PA 02/05/2021.  No CP at this time. Zio monitor  12/2018 showed PACs/PVCs.   CBC Latest Ref Rng & Units 01/07/2021 08/13/2020 05/15/2020  WBC 4.0 - 10.5 K/uL 5.1 5.0 5.3  Hemoglobin 12.0 - 15.0 g/dL 13.2 12.5 12.0  Hematocrit 36.0 - 46.0 % 40.1 38.4 36.8  Platelets 150 - 400 K/uL 288 257 237   CMP Latest Ref Rng & Units 01/07/2021 08/13/2020 05/15/2020  Glucose 70 - 99 mg/dL 97 80 77  BUN 6 - 20 mg/dL 8 16 10   Creatinine 0.44 - 1.00 mg/dL 0.98 0.99 0.87  Sodium 135 - 145 mmol/L 138 140 139  Potassium 3.5 - 5.1 mmol/L 3.9 4.1 4.1  Chloride 98 - 111 mmol/L 106 106 109  CO2 22 - 32 mmol/L 20(L) 23 24  Calcium 8.9 - 10.3 mg/dL 9.7 9.4 9.1  Total Protein 6.5 - 8.1 g/dL 7.0 7.2 6.9  Total Bilirubin 0.3 - 1.2 mg/dL 0.6 0.2(L) 0.3  Alkaline Phos 38 - 126 U/L 42 55 51  AST 15 - 41 U/L 16 15 17   ALT 0 - 44 U/L 9 10 <6   CTAP 01/07/2021: 1. No acute intra-abdominal process. 2. Oval 7 x 9 mm soft tissue density involving the endometrial canal within the lower uterine segment, potentially a polyp. Follow-up outpatient pelvic ultrasound is recommended.  Past Medical History:  Diagnosis Date  . Allergy   . Anemia   .  Anxiety   . Anxiety and depression   . Arthritis   . Asthma   . Chronic headaches   . Colon polyp   . COPD (chronic obstructive pulmonary disease) (HCC)    no o2  . Depression   . Fundic gland polyps of stomach, benign   . Gastritis   . GERD (gastroesophageal reflux disease)   . H. pylori infection 2013  . H/O gastric ulcer 1985  . History of anorexia nervosa   . History of bulimia   . History of pneumonia   . Hypothyroidism   . Irritable bowel syndrome (IBS)   . Obesity   . Personal history of other mental and behavioral disorders 01/14/2008   Qualifier: Diagnosis of  By: Carlean Purl MD, [name]   Overview:  Overview:  Annotation: BULIMIA Qualifier: Diagnosis of  By: Bertram Gala   . REFLEX SYMPATHETIC DYSTROPHY 02/26/2009   Qualifier: Diagnosis of  By: Carlean Purl MD, Tonna Boehringer E   . RSD (reflex sympathetic dystrophy)   . RSD (reflex sympathetic dystrophy)   . Ulcer    gastric, duodenal ulcer   Past Surgical History:  Procedure Laterality Date  . CERVICAL SPINE SURGERY  1990  . CERVIX SURGERY    . CHOLECYSTECTOMY    . COLONOSCOPY    . KNEE ARTHROSCOPY  04/21/2012   right  . LAPAROSCOPIC NISSEN FUNDOPLICATION  13/04/6577   Dr. Johnathan Hausen  . lumbar spondylosis injected  2013   Dr. Mina Marble  . NASAL SINUS SURGERY    . THORACOSCOPY W/ THORACIC SYMPATHECTOMY     for RSD  . TONSILLECTOMY    . UPPER GASTROINTESTINAL ENDOSCOPY      Social History: Nonsmoker. No alcohol or drug use.   Family History: family history includes Breast cancer in her maternal aunt; Colon polyps in her brother and mother; Irritable bowel syndrome in her mother; Kidney Stones in her brother; Lung cancer in her maternal grandfather; Other in her mother; Raynaud syndrome in her mother; Stroke in her mother. No family history of esophageal, gastric cancer  Allergies  Allergen Reactions  . Bee Pollen Anaphylaxis  . Diazepam     sucidal depression  . Prednisone Nausea And Vomiting    Per pt can not take oral steroids   . Amoxicillin     Severe diarrhea At high dose   . Doxycycline     Pt does not remember reaction  . Erythromycin     n & v  . Morphine     n & v  . Propoxyphene N-Acetaminophen     n & V  . Sulfa Antibiotics     MIGRAINES  . Tetracyclines & Related     N & v  . Propoxyphene Other (See Comments)      Outpatient Encounter Medications as of 01/10/2021  Medication Sig  . azelastine (ASTELIN) 0.1 % nasal spray Place 1 spray into both nostrils 2 (two) times daily. Use in each nostril as directed  . Belimumab (BENLYSTA) 200 MG/ML SOSY 1 ml  . busPIRone (BUSPAR) 15  MG tablet TAKE 1/2 TABLET BY MOUTH AT BREAKFAST AND AT BEDTIME  . cholecalciferol (VITAMIN D) 1000 UNITS tablet Take 2,000 Units by mouth daily.  . clindamycin (CLEOCIN T) 1 % lotion Apply 1 application topically 2 (two) times daily.   . Cranberry 200 MG CAPS Take 200 mg by mouth 2 (two) times daily.  . cyanocobalamin (,VITAMIN B-12,) 1000 MCG/ML injection INJECT ONE ML INTRAMUSCULARLY FOR ONE  DOSE ALTERNATING EVERY TWO WEEKS, THEN MONHTLY  . erythromycin with ethanol (EMGEL) 2 % gel Apply topically 2 (two) times daily.  Marland Kitchen estradiol (ESTRACE) 2 MG tablet TAKE ONE TABLET BY MOUTH EVERY MORNING  . fexofenadine-pseudoephedrine (ALLEGRA-D 24) 180-240 MG 24 hr tablet Take 1 tablet by mouth at bedtime.  . fluticasone (CUTIVATE) 0.05 % cream Apply 1 application topically 2 (two) times daily as needed (rash).   . fluticasone (FLONASE) 50 MCG/ACT nasal spray Place 2 sprays into both nostrils daily.  . folic acid (FOLVITE) 1 MG tablet Take 1 tablet (1 mg total) by mouth daily.  Marland Kitchen lamoTRIgine (LAMICTAL) 100 MG tablet Take 1 tablet (100 mg total) by mouth 2 (two) times daily. (Patient taking differently: Take 100 mg by mouth 2 (two) times daily. Take 1/2 tab PO in the morning and 1 tablet at HS)  . levothyroxine (SYNTHROID) 125 MCG tablet TAKE 1 TABLET BY MOUTH EVERY DAY  . medroxyPROGESTERone (PROVERA) 2.5 MG tablet TAKE 1 TABLET ALTERNATING WITH 2 TABLETS DAILY  . metFORMIN (GLUCOPHAGE) 500 MG tablet Take 2 tabs q am, 1 tab qhs.  . methadone (DOLOPHINE) 10 MG tablet Take 1-2 tablets (10-20 mg total) by mouth in the morning, at noon, and at bedtime. Patient reports taking, 2 tablets in the morning, 2 tablets around noon, and 1 tablet at bedtime.  . methotrexate (RHEUMATREX) 2.5 MG tablet Take 4 tablets (10 mg total) by mouth once a week.  . Metoclopramide HCl 10 MG TBDP Take 1 tablet (10 mg total) by mouth 4 (four) times daily.  . montelukast (SINGULAIR) 10 MG tablet TAKE 1 TABLET BY MOUTH EVERYDAY AT BEDTIME   . nystatin cream (MYCOSTATIN) Apply 1 application topically 2 (two) times daily.  Earney Navy Bicarbonate (ZEGERID) 20-1100 MG CAPS capsule Take 1 capsule by mouth daily.  . ondansetron (ZOFRAN) 4 MG tablet Take 1 tablet (4 mg total) by mouth every 8 (eight) hours as needed for nausea.  . promethazine (PHENERGAN) 25 MG tablet Take 1 tablet (25 mg total) by mouth every 8 (eight) hours as needed for up to 15 doses for nausea or vomiting.  . sertraline (ZOLOFT) 100 MG tablet TAKE TWO TABLETS BY MOUTH EVERY MORNING  . sucralfate (CARAFATE) 1 g tablet Take 1 tablet (1 g total) by mouth 4 (four) times daily -  with meals and at bedtime. (Patient taking differently: Take 1 g by mouth 3 (three) times daily as needed (indigestion).)  . sucralfate (CARAFATE) 1 g tablet Take 1 tablet (1 g total) by mouth 4 (four) times daily -  with meals and at bedtime for 14 days.  . Syringe/Needle, Disp, (SYRINGE 3CC/25GX1-1/2") 25G X 1-1/2" 3 ML MISC Inject 1cc (1016mcg) B12 q 2-4 weeks as directed  . tapentadol (NUCYNTA ER) 50 MG 12 hr tablet Take 50 mg by mouth every 12 (twelve) hours.  Marland Kitchen tiZANidine (ZANAFLEX) 4 MG capsule Take 3 capsules (12 mg total) by mouth at bedtime as needed for muscle spasms.  Marland Kitchen topiramate (TOPAMAX) 25 MG tablet TAKE 1 TABLET 5 TIMES A DAY  . triamcinolone cream (KENALOG) 0.1 %    Facility-Administered Encounter Medications as of 01/10/2021  Medication  . cyanocobalamin ((VITAMIN B-12)) injection 1,000 mcg     REVIEW OF SYSTEMS:  See HPI,  all other systems reviewed and are negative   PHYSICAL EXAM: Ht 5\' 3"  (1.6 m)   Wt 139 lb (63 kg)   BMI 24.62 kg/m  General: 60 year old female in no acute distress.  Head: Normocephalic and atraumatic. Eyes:  Sclerae non-icteric, conjunctive pink. Ears: Normal auditory acuity. Mouth: No ulcers or lesions.  Neck: Supple, no lymphadenopathy or thyromegaly.  Lungs: Clear bilaterally to auscultation without wheezes, crackles or  rhonchi. Heart: Regular rate and rhythm. No murmur, rub or gallop appreciated.  Abdomen: Soft, non distended. LUQ and RLQ with mild tenderness without rebound or guarding. No masses. No hepatosplenomegaly. Normoactive bowel sounds x 4 quadrants. Central umbilical hernia.  Rectal: Deferred.  Musculoskeletal: Symmetrical with no gross deformities. Skin: Warm and dry. No rash or lesions on visible extremities. Extremities: No edema. Neurological: Alert oriented x 4, no focal deficits.  Psychological:  Alert and cooperative. Normal mood and affect.  ASSESSMENT AND PLAN:  55. 60 year old female with diarrhea -GI pathogen panel  -Imodium 1 po bid as needed  -Push fluids, bland diet  -Consider a diagnostic colonoscopy if symptoms persist or worsen   2. Nausea, dry heaves.  -Phenergan 25mg  one po Q 8 hrs as needed   3. GERD. Past Nissen Fundoplication. LUQ pain. CTAP 01/07/2021 without acute intra-abdominal process. -Continue Omeprazole 20mg  QD -May require repeat EGD is symptoms persist   4. Past Iron deficiency without anemia.  Intolerant to oral iron. Received IV iron 11/2019.   5. History of adenomatous colon polyp in 2008. Last colonoscopy  08/2012 was normal. -Next colonoscopy due 08/2022, earlier if symptoms warrant   5. Systemic Lupus  6. Rheumatoid Arthritis followed by Rheumatology   7. Chronic pain (complex regional pain syndrome) on Methadone followed by pain management specialist Dr. Hardin Negus  8. Recent chest pain with palpitations, scheduled to see cardiology PA-C  in May. -Patient will require cardiac clearance if near future endoscopic evaluation pursued.   Patient to follow up in our office in 2 to 3 weeks. She will call our office if her symptoms worsen.     CC:  Caren Macadam, MD

## 2021-01-13 ENCOUNTER — Telehealth: Payer: Self-pay | Admitting: Family Medicine

## 2021-01-13 ENCOUNTER — Other Ambulatory Visit: Payer: HMO

## 2021-01-13 DIAGNOSIS — G8929 Other chronic pain: Secondary | ICD-10-CM | POA: Diagnosis not present

## 2021-01-13 DIAGNOSIS — Z79899 Other long term (current) drug therapy: Secondary | ICD-10-CM | POA: Diagnosis not present

## 2021-01-13 DIAGNOSIS — R768 Other specified abnormal immunological findings in serum: Secondary | ICD-10-CM | POA: Diagnosis not present

## 2021-01-13 DIAGNOSIS — G9050 Complex regional pain syndrome I, unspecified: Secondary | ICD-10-CM | POA: Diagnosis not present

## 2021-01-13 DIAGNOSIS — M791 Myalgia, unspecified site: Secondary | ICD-10-CM | POA: Diagnosis not present

## 2021-01-13 DIAGNOSIS — M359 Systemic involvement of connective tissue, unspecified: Secondary | ICD-10-CM | POA: Diagnosis not present

## 2021-01-13 DIAGNOSIS — M542 Cervicalgia: Secondary | ICD-10-CM | POA: Diagnosis not present

## 2021-01-13 DIAGNOSIS — R197 Diarrhea, unspecified: Secondary | ICD-10-CM | POA: Diagnosis not present

## 2021-01-13 DIAGNOSIS — I7300 Raynaud's syndrome without gangrene: Secondary | ICD-10-CM | POA: Diagnosis not present

## 2021-01-13 DIAGNOSIS — M329 Systemic lupus erythematosus, unspecified: Secondary | ICD-10-CM | POA: Diagnosis not present

## 2021-01-13 DIAGNOSIS — R5383 Other fatigue: Secondary | ICD-10-CM | POA: Diagnosis not present

## 2021-01-13 DIAGNOSIS — M1990 Unspecified osteoarthritis, unspecified site: Secondary | ICD-10-CM | POA: Diagnosis not present

## 2021-01-13 NOTE — Telephone Encounter (Signed)
Patient is calling and stated that she has been experiencing diarrhea and nausea for months now and is wanting to come in and see the provider and to also have an antibody test for covid. Pt wanted to see if provider could work her in a day this week to be seen, please advise. CB is [phone]

## 2021-01-13 NOTE — Telephone Encounter (Signed)
Attempted to reach the pt at the home number and schedule a virtual visit with another provider as PCP does not have any openings and she stated could not hear me clearly. No answer at the pts cell number.

## 2021-01-14 ENCOUNTER — Other Ambulatory Visit: Payer: Self-pay

## 2021-01-14 ENCOUNTER — Other Ambulatory Visit: Payer: HMO

## 2021-01-14 ENCOUNTER — Telehealth (INDEPENDENT_AMBULATORY_CARE_PROVIDER_SITE_OTHER): Payer: PPO | Admitting: Family Medicine

## 2021-01-14 ENCOUNTER — Encounter: Payer: Self-pay | Admitting: Family Medicine

## 2021-01-14 DIAGNOSIS — R112 Nausea with vomiting, unspecified: Secondary | ICD-10-CM | POA: Diagnosis not present

## 2021-01-14 DIAGNOSIS — R197 Diarrhea, unspecified: Secondary | ICD-10-CM

## 2021-01-14 LAB — GI PROFILE, STOOL, PCR

## 2021-01-14 NOTE — Telephone Encounter (Signed)
Spoke with the pt and scheduled a virtual visit today at 11am with Dr Maudie Mercury as PCP is out of the office.

## 2021-01-14 NOTE — Patient Instructions (Signed)
Call the gynecology office today to schedule an appointment for the endometrial abnormality.  Follow-up with your gastroenterologist later this week, if you have not heard back regarding your results.  Keep a 2 to 3-week follow-up with them as was planned in their notes.  Call them sooner if your symptoms worsen or change.  Schedule a follow-up visit with your primary care doctor in person in around 2 weeks.  Please contact their office to schedule this appointment.  I sent them a message as well.  I hope you are feeling better soon!  Seek in person care promptly if your symptoms worsen, new concerns arise or you are not improving with treatment.  It was nice to meet you today. I help [facility] out with telemedicine visits on Tuesdays and Thursdays and am available for visits on those days. If you have any concerns or questions following this visit please schedule a follow up visit with your Primary Care doctor or seek care at a local urgent care clinic to avoid delays in care.

## 2021-01-14 NOTE — Progress Notes (Signed)
Virtual Visit via Video Note  I connected with Jo [name]  on 01/14/21 at 11:00 AM EDT by a video enabled telemedicine application and verified that I am speaking with the correct person using two identifiers.  Location patient: home, [place] Location provider:work or home office Persons participating in the virtual visit: patient, provider  I discussed the limitations of evaluation and management by telemedicine and the availability of in person appointments. The patient expressed understanding and agreed to proceed.   HPI:  Acute telemedicine visit for NVD: -Onset: diarrhea for a few months, worsening since the middle of March with also with some nausea, anorexia, abd pain, diarrhea, weight loss, GERD s/p Nissen Fundoplication and adenomatous colon polyp in 2008 -she was evaluated in the ER with CTAP (she is aware of the endometrial abnormality and plans to f/u with her gyn about this), labs, lipase, LFTs -she saw her gastroenterologist recently about this and results are pending for studies per patient -she was given phenergan and this helps with the nausea and imodium -see PMH below -Pertinent medication allergies:numerous -COVID-19 vaccine status: vaccinated and boosted -she is wondering if this could be covid  ROS: See pertinent positives and negatives per HPI.  Past Medical History:  Diagnosis Date  . Allergy   . Anemia   . Anxiety   . Anxiety and depression   . Arrhythmia 2021  . Arthritis   . Asthma   . Cervical cancer [facility])    age 61   . Chronic headaches   . Colon polyp   . COPD (chronic obstructive pulmonary disease) (HCC)    no o2  . Depression   . Endometriosis   . Fibromyalgia   . Fundic gland polyps of stomach, benign   . Gastritis   . GERD (gastroesophageal reflux disease)   . H. pylori infection 2013  . H/O gastric ulcer 1985  . History of anorexia nervosa   . History of bulimia   . History of pneumonia   . Hypothyroidism   . Irritable bowel syndrome (IBS)    . Obesity   . Personal history of other mental and behavioral disorders 01/14/2008   Qualifier: Diagnosis of  By: Carlean Purl MD, [name]   Overview:  Overview:  Annotation: BULIMIA Qualifier: Diagnosis of  By: Bertram Gala   . REFLEX SYMPATHETIC DYSTROPHY 02/26/2009   Qualifier: Diagnosis of  By: Carlean Purl MD, [name] Rheumatoid arteritis ([place])   . RSD (reflex sympathetic dystrophy)   . RSD (reflex sympathetic dystrophy)   . RSD (reflex sympathetic dystrophy)   . Sleep apnea   . Systemic lupus ([place]) 2021  . Ulcer    gastric, duodenal ulcer    Past Surgical History:  Procedure Laterality Date  . CERVICAL SPINE SURGERY  1990  . CERVIX SURGERY    . CHOLECYSTECTOMY    . COLONOSCOPY    . KNEE ARTHROSCOPY  04/21/2012   right  . LAPAROSCOPIC NISSEN FUNDOPLICATION  58/05/9832   Dr. Johnathan Hausen  . laparoscopy     for endometriosis  . lumbar spondylosis injected  2013   Dr. Mina Marble  . NASAL SINUS SURGERY    . THORACOSCOPY W/ THORACIC SYMPATHECTOMY     for RSD  . TONSILLECTOMY    . UPPER GASTROINTESTINAL ENDOSCOPY       Current Outpatient Medications:  .  azelastine (ASTELIN) 0.1 % nasal spray, Place 1 spray into both nostrils 2 (two) times daily. Use in each nostril as directed, Disp:  30 mL, Rfl: 2 .  Belimumab (BENLYSTA) 200 MG/ML SOSY, once a week., Disp: , Rfl:  .  busPIRone (BUSPAR) 15 MG tablet, TAKE 1/2 TABLET BY MOUTH AT BREAKFAST AND AT BEDTIME, Disp: 90 tablet, Rfl: 0 .  cholecalciferol (VITAMIN D) 1000 UNITS tablet, Take 2,000 Units by mouth daily., Disp: , Rfl:  .  clindamycin (CLEOCIN T) 1 % lotion, Apply 1 application topically 2 (two) times daily as needed., Disp: , Rfl:  .  Cranberry 200 MG CAPS, Take 200 mg by mouth 2 (two) times daily., Disp: , Rfl:  .  cyanocobalamin (,VITAMIN B-12,) 1000 MCG/ML injection, INJECT ONE ML INTRAMUSCULARLY FOR ONE DOSE ALTERNATING EVERY TWO WEEKS, THEN MONHTLY, Disp: 10 mL, Rfl: 3 .  erythromycin with ethanol (EMGEL)  2 % gel, Apply topically 2 (two) times daily. (Patient taking differently: Apply topically 2 (two) times daily as needed.), Disp: 30 g, Rfl: 1 .  estradiol (ESTRACE) 2 MG tablet, TAKE ONE TABLET BY MOUTH EVERY MORNING, Disp: 90 tablet, Rfl: 0 .  fexofenadine-pseudoephedrine (ALLEGRA-D 24) 180-240 MG 24 hr tablet, Take 1 tablet by mouth at bedtime., Disp: , Rfl:  .  fluticasone (CUTIVATE) 0.05 % cream, Apply 1 application topically 2 (two) times daily as needed (rash). , Disp: , Rfl:  .  fluticasone (FLONASE) 50 MCG/ACT nasal spray, Place 2 sprays into both nostrils daily., Disp: 16 g, Rfl: 5 .  folic acid (FOLVITE) 1 MG tablet, Take 1 tablet (1 mg total) by mouth daily. (Patient taking differently: Take 1 mg by mouth in the morning and at bedtime.), Disp: 30 tablet, Rfl: 6 .  HYDROmorphone (DILAUDID) 4 MG tablet, Take by mouth every 4 (four) hours as needed for severe pain., Disp: , Rfl:  .  lamoTRIgine (LAMICTAL) 100 MG tablet, Take 1 tablet (100 mg total) by mouth 2 (two) times daily., Disp: 180 tablet, Rfl: 3 .  levothyroxine (SYNTHROID) 125 MCG tablet, TAKE 1 TABLET BY MOUTH EVERY DAY, Disp: 90 tablet, Rfl: 1 .  LYSINE PO, Take 1 tablet by mouth daily., Disp: , Rfl:  .  medroxyPROGESTERone (PROVERA) 2.5 MG tablet, TAKE 1 TABLET ALTERNATING WITH 2 TABLETS DAILY (Patient taking differently: Take 2.5 mg by mouth daily.), Disp: 135 tablet, Rfl: 1 .  metFORMIN (GLUCOPHAGE) 500 MG tablet, Take 2 tabs q am, 1 tab qhs. (Patient taking differently: Take 500 mg by mouth in the morning and at bedtime.), Disp: 270 tablet, Rfl: 3 .  methadone (DOLOPHINE) 10 MG tablet, Take 1-2 tablets (10-20 mg total) by mouth in the morning, at noon, and at bedtime. Patient reports taking, 2 tablets in the morning, 2 tablets around noon, and 1 tablet at bedtime. (Patient taking differently: Take 10-20 mg by mouth in the morning, at noon, and at bedtime. Patient reports taking,  10 mg in  morning, 5 mg at bedtime.), Disp: 120  tablet, Rfl: 0 .  methotrexate (RHEUMATREX) 2.5 MG tablet, Take 4 tablets (10 mg total) by mouth once a week., Disp: 4 tablet, Rfl:  .  Metoclopramide HCl 10 MG TBDP, Take 1 tablet (10 mg total) by mouth 4 (four) times daily. (Patient taking differently: Take 10 mg by mouth as needed.), Disp: 360 tablet, Rfl: 3 .  montelukast (SINGULAIR) 10 MG tablet, TAKE 1 TABLET BY MOUTH EVERYDAY AT BEDTIME, Disp: 90 tablet, Rfl: 3 .  Omeprazole-Sodium Bicarbonate (ZEGERID) 20-1100 MG CAPS capsule, Take 1 capsule by mouth daily., Disp: 90 capsule, Rfl: 1 .  ondansetron (ZOFRAN) 4 MG tablet, Take 1  tablet (4 mg total) by mouth every 8 (eight) hours as needed for nausea., Disp: 30 tablet, Rfl: 3 .  promethazine (PHENERGAN) 25 MG tablet, Take 1 tablet (25 mg total) by mouth every 8 (eight) hours as needed for up to 20 doses for nausea or vomiting. Take one tablet every 8- 12 hours as needed., Disp: 20 tablet, Rfl: 0 .  sertraline (ZOLOFT) 100 MG tablet, TAKE TWO TABLETS BY MOUTH EVERY MORNING, Disp: 180 tablet, Rfl: 0 .  sucralfate (CARAFATE) 1 g tablet, Take 1 tablet (1 g total) by mouth 4 (four) times daily -  with meals and at bedtime for 14 days., Disp: 56 tablet, Rfl: 0 .  Syringe/Needle, Disp, (SYRINGE 3CC/25GX1-1/2") 25G X 1-1/2" 3 ML MISC, Inject 1cc (1019mcg) B12 q 2-4 weeks as directed, Disp: 100 each, Rfl: 3 .  tiZANidine (ZANAFLEX) 4 MG capsule, Take 3 capsules (12 mg total) by mouth at bedtime as needed for muscle spasms., Disp: 90 capsule, Rfl: 1 .  topiramate (TOPAMAX) 25 MG tablet, TAKE 1 TABLET 5 TIMES A DAY, Disp: 150 tablet, Rfl: 1 .  triamcinolone cream (KENALOG) 0.1 %, daily as needed., Disp: , Rfl:   Current Facility-Administered Medications:  .  cyanocobalamin ((VITAMIN B-12)) injection 1,000 mcg, 1,000 mcg, Intramuscular, Q14 Days, Koberlein, Junell C, MD, 1,000 mcg at 05/25/18 1244  EXAM:  VITALS per patient if applicable:  GENERAL: alert, oriented, appears well and in no acute  distress  HEENT: atraumatic, conjunttiva clear, no obvious abnormalities on inspection of external nose and ears  NECK: normal movements of the head and neck  LUNGS: on inspection no signs of respiratory distress, breathing rate appears normal, no obvious gross SOB, gasping or wheezing  CV: no obvious cyanosis  MS: moves all visible extremities without noticeable abnormality  PSYCH/NEURO: pleasant and cooperative, no obvious depression or anxiety, speech and thought processing grossly intact  ASSESSMENT AND PLAN:  Discussed the following assessment and plan:  Nausea and vomiting, intractability of vomiting not specified, unspecified vomiting type  Diarrhea, unspecified type  -we discussed possible serious and likely etiologies, options for evaluation and workup, limitations of telemedicine visit vs in person visit, treatment, treatment risks and precautions. Pt prefers to treat via telemedicine empirically rather than in person at this moment.  I am quite concerned about her symptoms and I am glad she is seeing the gastroenterologist.  Advised to stay on top of current plan to see GYN about the endometrial abnormality and continue work-up with GI.  It appears that GI is waiting on a GI pathogen panel, and plans appropriately to pursue possible EGD/colonoscopy if that is unrevealing.  I also recommended a close follow-up with primary care doctor's if these results are unrevealing as other etiologies may need to be considered.  She does admit she has been under a lot of stress.  She also is wondering if COVID could be causing her symptoms.  She was wondering about the utility of antibody testing for COVID.  Advised this would not change the work-up to look for other causes of her symptoms, as many people have had COVID throughout the pandemic, who had mild symptoms or did not know it.  She can request this when doing an in person visit with GI or her PCP if she feels strongly about this.  Advised  she call her GYN office today to schedule an appointment and follow-up with her GI office as planned, sooner if any worsening.  Sent a message to PCP office as  well to schedule in person follow-up.  Advised to seek prompt in person care if worsening, new symptoms arise, or if is not improving with treatment. Discussed options for inperson care if PCP office not available. Did let this patient know that I only do telemedicine on Tuesdays and Thursdays for [place]. Advised to schedule follow up visit with PCP or [facility] if any further questions or concerns to avoid delays in care.   I discussed the assessment and treatment plan with the patient. The patient was provided an opportunity to ask questions and all were answered. The patient agreed with the plan and demonstrated an understanding of the instructions.     Lucretia Kern, DO

## 2021-01-17 ENCOUNTER — Telehealth: Payer: Self-pay

## 2021-01-17 ENCOUNTER — Telehealth: Payer: Self-pay | Admitting: Family Medicine

## 2021-01-17 ENCOUNTER — Telehealth: Payer: Self-pay | Admitting: Nurse Practitioner

## 2021-01-17 NOTE — Telephone Encounter (Signed)
Not sure what she is referring to; can you get more information please?

## 2021-01-17 NOTE — Telephone Encounter (Signed)
Patient states she would like to have an antigen test for Covid due to recurrent diarrhea, after seeing her GI with no problem found and Dr Maudie Mercury to see if she has had Covid.  Please call the pts home number [phone] instead her cell.   Message sent to PCP.

## 2021-01-17 NOTE — Telephone Encounter (Signed)
-----   Message from Noralyn Pick, NP sent at 01/14/2021  7:59 PM EDT ----- Lenna Sciara, pls inform the patient her GI pathogen panel was negative. Patient to call office if symptoms worsen. Pls remind patient she will need to see her cardiologist prior to pursing EGD/colonoscopy. Thx

## 2021-01-17 NOTE — Telephone Encounter (Signed)
Patient has been notified of test results, she is rather frustrated because she said when she went to the ER prior to her appointment with [place], she said she was given "cardiac clearance" because they did testing on her heart and said it was fine. She does not understand why she needs to see cardiology and would like this message sent to Dr. Carlean Purl for when he returns next week.

## 2021-01-17 NOTE — Telephone Encounter (Signed)
Patient is requesting to speak with a nurse regarding lab results.

## 2021-01-17 NOTE — Telephone Encounter (Signed)
Pt call and want to know can she come and have the Antigen test and want a call back .

## 2021-01-20 NOTE — Telephone Encounter (Signed)
Hi Jo [name] - I spoke with Jo [name] on Friday because she was worried that symptoms were coming from [place] perhaps, but as we discussed I do not feel that there is testing which would be helpful for this at this point (she thinks if she had it then it was like 4-6 months ago and that the diarrhea may be from that, but we talked about testing being limited with her being vaccinated and that having infection would not necessarily change management now).   I told her I would review her chart and get back to her with any additional work up I thought would be helpful, but I feel between what the ER did and what you did the bases for at least starting evaluation are covered. She continues to have very significant nausea, diarrhea, and no appetite. She is not eating much and not eating dairy/anything that I feel is contributing.   Just hoping you can reach out and determine a follow up plan with her.   I'm going to ask her to consider holding metformin at this point; she just takes for PCOS history. She's not had issues with it in the past, but I feel it is an easy thing to leave off if she is willing and see if it helps.   Appreciate any help you can give her. Thanks! Halcyon Heck

## 2021-01-21 ENCOUNTER — Ambulatory Visit: Payer: HMO | Admitting: Nurse Practitioner

## 2021-01-21 NOTE — Telephone Encounter (Signed)
Chart reviewed.  Please explain to the patient that since she has been referred to cardiology for chest pain and palpitations prior to seeing [place] we need her to follow through with that before we sedate her and put her through any procedures.  She has a cardiology appointment for May 18.  Melissa, please get an update on her GI symptoms and I will see what we might do otherwise while awaiting for the cardiology appointment.

## 2021-01-22 NOTE — Telephone Encounter (Signed)
Noted  

## 2021-01-23 NOTE — Telephone Encounter (Signed)
I called and left her a message she did not pick up.  I think the best option here is for her to see me next week we can use one of my working spots and 1130 or 350.  We should cancel her follow-up with [facility].

## 2021-01-23 NOTE — Telephone Encounter (Signed)
Spoke to patient, he sx are the same and not improving.  Patient was told at the hospital that since they did a negative cardiac work up she no longer needed to see cardiology is is canceling that appointment.  Please advise.

## 2021-01-24 NOTE — Telephone Encounter (Signed)
Appointment has been made

## 2021-01-24 NOTE — Telephone Encounter (Signed)
Patient returned the call said she will come in on Tuesday 01/28/21 at 11:30am I am unable to schedule on those slots please reschedule. Thanks

## 2021-01-27 ENCOUNTER — Other Ambulatory Visit: Payer: HMO

## 2021-01-28 ENCOUNTER — Inpatient Hospital Stay: Admission: RE | Admit: 2021-01-28 | Payer: HMO | Source: Ambulatory Visit

## 2021-01-28 ENCOUNTER — Other Ambulatory Visit: Payer: HMO

## 2021-01-28 ENCOUNTER — Ambulatory Visit: Payer: PPO | Admitting: Internal Medicine

## 2021-01-28 ENCOUNTER — Encounter: Payer: Self-pay | Admitting: Internal Medicine

## 2021-01-28 ENCOUNTER — Other Ambulatory Visit: Payer: Self-pay

## 2021-01-28 VITALS — BP 100/70 | HR 64 | Ht 63.5 in | Wt 139.5 lb

## 2021-01-28 DIAGNOSIS — R1115 Cyclical vomiting syndrome unrelated to migraine: Secondary | ICD-10-CM | POA: Diagnosis not present

## 2021-01-28 DIAGNOSIS — F439 Reaction to severe stress, unspecified: Secondary | ICD-10-CM | POA: Diagnosis not present

## 2021-01-28 DIAGNOSIS — R197 Diarrhea, unspecified: Secondary | ICD-10-CM

## 2021-01-28 DIAGNOSIS — R634 Abnormal weight loss: Secondary | ICD-10-CM

## 2021-01-28 DIAGNOSIS — M329 Systemic lupus erythematosus, unspecified: Secondary | ICD-10-CM

## 2021-01-28 MED ORDER — CLENPIQ 10-3.5-12 MG-GM -GM/160ML PO SOLN: 1.0000 | ORAL | 0 refills | Status: DC
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
## 2021-01-29 ENCOUNTER — Ambulatory Visit: Payer: PPO | Admitting: Nurse Practitioner

## 2021-01-29 ENCOUNTER — Telehealth: Payer: Self-pay

## 2021-01-29 MED ORDER — METOCLOPRAMIDE HCL 10 MG PO TABS: 10.0000 mg | ORAL_TABLET | Freq: Four times a day (QID) | ORAL | 0 refills | Status: AC | PRN

## 2021-01-29 NOTE — Telephone Encounter (Signed)
-----   Message from Gatha Mayer, MD sent at 01/29/2021  7:36 AM EDT ----- Reglan 10 mg q 6 hrs prn nausea and vomiting # 30 no RF ----- Message ----- From: [name], Akili Corsetti E, CMA Sent: 01/28/2021   5:29 PM EDT To: Gatha Mayer, MD  I spoke with Lattie Haw and she said she thought you said she could take reglan to help her nausea as she preps. I told her I would run it by you again. She has some that's out dated by a year.

## 2021-01-29 NOTE — Telephone Encounter (Signed)
I informed Jo [name] that we were sending this in for her and per her request I put for them to deliver it to her.

## 2021-01-30 ENCOUNTER — Encounter: Payer: Self-pay | Admitting: Internal Medicine

## 2021-01-30 ENCOUNTER — Other Ambulatory Visit: Payer: Self-pay

## 2021-01-30 ENCOUNTER — Other Ambulatory Visit (INDEPENDENT_AMBULATORY_CARE_PROVIDER_SITE_OTHER): Payer: PPO

## 2021-01-30 ENCOUNTER — Ambulatory Visit (AMBULATORY_SURGERY_CENTER): Payer: PPO | Admitting: Internal Medicine

## 2021-01-30 VITALS — BP 108/69 | HR 73 | Temp 97.8°F | Resp 10 | Ht 63.5 in | Wt 139.0 lb

## 2021-01-30 DIAGNOSIS — G4733 Obstructive sleep apnea (adult) (pediatric): Secondary | ICD-10-CM | POA: Diagnosis not present

## 2021-01-30 DIAGNOSIS — R1115 Cyclical vomiting syndrome unrelated to migraine: Secondary | ICD-10-CM

## 2021-01-30 DIAGNOSIS — R1084 Generalized abdominal pain: Secondary | ICD-10-CM | POA: Diagnosis not present

## 2021-01-30 DIAGNOSIS — M797 Fibromyalgia: Secondary | ICD-10-CM | POA: Diagnosis not present

## 2021-01-30 DIAGNOSIS — K317 Polyp of stomach and duodenum: Secondary | ICD-10-CM

## 2021-01-30 DIAGNOSIS — M329 Systemic lupus erythematosus, unspecified: Secondary | ICD-10-CM

## 2021-01-30 DIAGNOSIS — R634 Abnormal weight loss: Secondary | ICD-10-CM

## 2021-01-30 DIAGNOSIS — R1110 Vomiting, unspecified: Secondary | ICD-10-CM | POA: Diagnosis not present

## 2021-01-30 DIAGNOSIS — J449 Chronic obstructive pulmonary disease, unspecified: Secondary | ICD-10-CM | POA: Diagnosis not present

## 2021-01-30 DIAGNOSIS — R197 Diarrhea, unspecified: Secondary | ICD-10-CM | POA: Diagnosis not present

## 2021-01-30 DIAGNOSIS — K2970 Gastritis, unspecified, without bleeding: Secondary | ICD-10-CM | POA: Diagnosis not present

## 2021-01-30 DIAGNOSIS — M069 Rheumatoid arthritis, unspecified: Secondary | ICD-10-CM | POA: Diagnosis not present

## 2021-01-30 DIAGNOSIS — F439 Reaction to severe stress, unspecified: Secondary | ICD-10-CM | POA: Diagnosis not present

## 2021-01-30 DIAGNOSIS — K219 Gastro-esophageal reflux disease without esophagitis: Secondary | ICD-10-CM | POA: Diagnosis not present

## 2021-01-30 DIAGNOSIS — E119 Type 2 diabetes mellitus without complications: Secondary | ICD-10-CM | POA: Diagnosis not present

## 2021-01-30 DIAGNOSIS — K2950 Unspecified chronic gastritis without bleeding: Secondary | ICD-10-CM | POA: Diagnosis not present

## 2021-01-30 LAB — TSH: TSH: 0.41 u[IU]/mL (ref 0.35–4.50)

## 2021-01-30 MED ORDER — SODIUM CHLORIDE 0.9 % IV SOLN: 500.0000 mL | Freq: Once | INTRAVENOUS | Status: DC

## 2021-01-30 NOTE — Progress Notes (Signed)
Called to room to assist during endoscopic procedure.  Patient ID and intended procedure confirmed with present staff. Received instructions for my participation in the procedure from the performing physician.  

## 2021-01-30 NOTE — Op Note (Signed)
[facility] Patient Name: Jo [name] Procedure Date: 01/30/2021 12:25 PM MRN: [id number] Endoscopist: Gatha Mayer , MD Age: 60 Referring MD:  Date of Birth: [birth year] Gender: Female Account #: [id number] Procedure:                Upper GI endoscopy Indications:              Generalized abdominal pain, Diarrhea, Nausea with                            vomiting, Weight loss Medicines:                Propofol per Anesthesia, Monitored Anesthesia Care Procedure:                Pre-Anesthesia Assessment:                           - Prior to the procedure, a History and Physical                            was performed, and patient medications and                            allergies were reviewed. The patient's tolerance of                            previous anesthesia was also reviewed. The risks                            and benefits of the procedure and the sedation                            options and risks were discussed with the patient.                            All questions were answered, and informed consent                            was obtained. Prior Anticoagulants: The patient has                            taken no previous anticoagulant or antiplatelet                            agents. ASA Grade Assessment: II - A patient with                            mild systemic disease. After reviewing the risks                            and benefits, the patient was deemed in                            satisfactory condition to undergo the procedure.  After obtaining informed consent, the endoscope was                            passed under direct vision. Throughout the                            procedure, the patient's blood pressure, pulse, and                            oxygen saturations were monitored continuously. The                            Endoscope was introduced through the mouth, and                            advanced to  the second part of duodenum. The upper                            GI endoscopy was accomplished without difficulty.                            The patient tolerated the procedure well. Scope In: Scope Out: Findings:                 The examined esophagus was normal.                           Striped moderately erythematous mucosa without                            bleeding was found in the gastric antrum. Biopsies                            were taken with a cold forceps for histology.                            Verification of patient identification for the                            specimen was done. Estimated blood loss was minimal.                           Multiple small semi-sessile polyps with no stigmata                            of recent bleeding were found in the gastric body.                            Biopsies were taken with a cold forceps for                            histology. Verification of patient identification  for the specimen was done. Estimated blood loss was                            minimal.                           The exam was otherwise without abnormality.                           The cardia and gastric fundus were normal on                            retroflexion. Complications:            No immediate complications. Estimated Blood Loss:     Estimated blood loss was minimal. Impression:               - Normal esophagus.                           - Erythematous mucosa in the antrum. Biopsied.                           - Multiple gastric polyps. Biopsied.                           - The examination was otherwise normal. Recommendation:           - Patient has a contact number available for                            emergencies. The signs and symptoms of potential                            delayed complications were discussed with the                            patient. Return to normal activities tomorrow.                             Written discharge instructions were provided to the                            patient.                           - Resume previous diet.                           - Continue present medications.                           - Await pathology results. Gatha Mayer, MD 01/30/2021 1:52:04 PM This report has been signed electronically.

## 2021-01-30 NOTE — Patient Instructions (Addendum)
I did not see anything bad - the stomach looks possibly irritated - biopsies taken. There were some small stomach polyps biopsied also - I do not think these are a problem.  No colon polyps or cancer seen - I took colon lining biopsies to see if there is microscopic inflammation.  Hang in there and I will contact with results next week or so.  I appreciate the opportunity to care for you. Gatha Mayer, MD, FACG  YOU HAD AN ENDOSCOPIC PROCEDURE TODAY AT [facility]:   Refer to the procedure report that was given to you for any specific questions about what was found during the examination.  If the procedure report does not answer your questions, please call your gastroenterologist to clarify.  If you requested that your care partner not be given the details of your procedure findings, then the procedure report has been included in a sealed envelope for you to review at your convenience later.  YOU SHOULD EXPECT: Some feelings of bloating in the abdomen. Passage of more gas than usual.  Walking can help get rid of the air that was put into your GI tract during the procedure and reduce the bloating. If you had a lower endoscopy (such as a colonoscopy or flexible sigmoidoscopy) you may notice spotting of blood in your stool or on the toilet paper. If you underwent a bowel prep for your procedure, you may not have a normal bowel movement for a few days.  Please Note:  You might notice some irritation and congestion in your nose or some drainage.  This is from the oxygen used during your procedure.  There is no need for concern and it should clear up in a day or so.  SYMPTOMS TO REPORT IMMEDIATELY:   Following lower endoscopy (colonoscopy or flexible sigmoidoscopy):  Excessive amounts of blood in the stool  Significant tenderness or worsening of abdominal pains  Swelling of the abdomen that is new, acute  Fever of 100F or higher   Following upper endoscopy (EGD)  Vomiting of  blood or coffee ground material  New chest pain or pain under the shoulder blades  Painful or persistently difficult swallowing  New shortness of breath  Fever of 100F or higher  Black, tarry-looking stools  For urgent or emergent issues, a gastroenterologist can be reached at any hour by calling [phone]. Do not use MyChart messaging for urgent concerns.    DIET:  We do recommend a small meal at first, but then you may proceed to your regular diet.  Drink plenty of fluids but you should avoid alcoholic beverages for 24 hours.  ACTIVITY:  You should plan to take it easy for the rest of today and you should NOT DRIVE or use heavy machinery until tomorrow (because of the sedation medicines used during the test).    FOLLOW UP: Our staff will call the number listed on your records 48-72 hours following your procedure to check on you and address any questions or concerns that you may have regarding the information given to you following your procedure. If we do not reach you, we will leave a message.  We will attempt to reach you two times.  During this call, we will ask if you have developed any symptoms of COVID 19. If you develop any symptoms (ie: fever, flu-like symptoms, shortness of breath, cough etc.) before then, please call [phone].  If you test positive for Covid 19 in the 2 weeks post procedure, please  call and report this information to [place].    If any biopsies were taken you will be contacted by phone or by letter within the next 1-3 weeks.  Please call us at [phone] if you have not heard about the biopsies in 3 weeks.    SIGNATURES/CONFIDENTIALITY: You and/or your care partner have signed paperwork which will be entered into your electronic medical record.  These signatures attest to the fact that that the information above on your After Visit Summary has been reviewed and is understood.  Full responsibility of the confidentiality of this discharge information lies  with you and/or your care-partner.

## 2021-01-30 NOTE — Progress Notes (Signed)
Pt. Held in RR until pt. And sister agreed pt. Fully alert and awake.

## 2021-01-30 NOTE — Progress Notes (Signed)
Report to PACU, RN, vss, BBS= Clear.  

## 2021-01-30 NOTE — Op Note (Signed)
[place] Patient Name: Jo [name] Procedure Date: 01/30/2021 12:17 PM MRN: [id number] Endoscopist: Gatha Mayer , MD Age: 60 Referring MD:  Date of Birth: 13-Mar-1961 Gender: Female Account #: [id number] Procedure:                Colonoscopy Indications:              Generalized abdominal pain, Clinically significant                            diarrhea of unexplained origin, Weight loss Medicines:                Propofol per Anesthesia, Monitored Anesthesia Care Procedure:                Pre-Anesthesia Assessment:                           - Prior to the procedure, a History and Physical                            was performed, and patient medications and                            allergies were reviewed. The patient's tolerance of                            previous anesthesia was also reviewed. The risks                            and benefits of the procedure and the sedation                            options and risks were discussed with the patient.                            All questions were answered, and informed consent                            was obtained. Prior Anticoagulants: The patient has                            taken no previous anticoagulant or antiplatelet                            agents. ASA Grade Assessment: II - A patient with                            mild systemic disease. After reviewing the risks                            and benefits, the patient was deemed in                            satisfactory condition to undergo the procedure.  After obtaining informed consent, the colonoscope                            was passed under direct vision. Throughout the                            procedure, the patient's blood pressure, pulse, and                            oxygen saturations were monitored continuously. The                            Olympus PFC-H190DL [id number] [id number]) Colonoscope was                             introduced through the anus and advanced to the the                            terminal ileum, with identification of the                            appendiceal orifice and IC valve. The colonoscopy                            was performed without difficulty. The patient                            tolerated the procedure well. The quality of the                            bowel preparation was good. The bowel preparation                            used was Clenpiq via split dose instruction. The                            terminal ileum, ileocecal valve, appendiceal                            orifice, and rectum were photographed. Scope In: 1:33:34 PM Scope Out: 1:45:37 PM Scope Withdrawal Time: 0 hours 6 minutes 54 seconds  Total Procedure Duration: 0 hours 12 minutes 3 seconds  Findings:                 The perianal and digital rectal examinations were                            normal.                           The terminal ileum appeared normal.                           The entire examined colon appeared normal on direct  and retroflexion views.                           Biopsies for histology were taken with a cold                            forceps from the ascending colon, transverse colon,                            descending colon and sigmoid colon for evaluation                            of microscopic colitis. Estimated blood loss was                            minimal. Complications:            No immediate complications. Estimated Blood Loss:     Estimated blood loss was minimal. Impression:               - The examined portion of the ileum was normal.                           - The entire examined colon is normal on direct and                            retroflexion views.                           - Biopsies were taken with a cold forceps from the                            ascending colon, transverse colon, descending colon                             and sigmoid colon for evaluation of microscopic                            colitis. Recommendation:           - Patient has a contact number available for                            emergencies. The signs and symptoms of potential                            delayed complications were discussed with the                            patient. Return to normal activities tomorrow.                            Written discharge instructions were provided to the                            patient.                           -  Resume previous diet.                           - Continue present medications.                           - Await pathology results. Gatha Mayer, MD 01/30/2021 1:54:11 PM This report has been signed electronically.

## 2021-02-03 ENCOUNTER — Telehealth: Payer: Self-pay

## 2021-02-03 ENCOUNTER — Telehealth: Payer: Self-pay | Admitting: *Deleted

## 2021-02-03 NOTE — Telephone Encounter (Signed)
  Follow up Call-  Call back number 01/30/2021  Post procedure Call Back phone  # [phone]  Permission to leave phone message Yes  Some recent data might be hidden     Left Message

## 2021-02-03 NOTE — Telephone Encounter (Signed)
  Follow up Call-  Call back number 01/30/2021  Post procedure Call Back phone  # [phone]  Permission to leave phone message Yes  Some recent data might be hidden     Patient questions:  Do you have a fever, pain , or abdominal swelling? No. Pain Score  0 *  Have you tolerated food without any problems? Yes.     Have you been able to return to your normal activities? Yes.    Do you have any questions about your discharge instructions: Diet   No. Medications  No. Follow up visit  No.  Do you have questions or concerns about your Care? No.  PT said the phenergan is helping with her nausea!  Actions: * If pain score is 4 or above: No action needed, pain <4.  1. Have you developed a fever since your procedure? no  2.   Have you had an respiratory symptoms (SOB or cough) since your procedure? no  3.   Have you tested positive for COVID 19 since your procedure no  4.   Have you had any family members/close contacts diagnosed with the COVID 19 since your procedure?  no   If yes to any of these questions please route to Joylene John, RN and Joella Prince, RN

## 2021-02-04 ENCOUNTER — Telehealth: Payer: Self-pay | Admitting: Pharmacist

## 2021-02-04 NOTE — Chronic Care Management (AMB) (Signed)
Chronic Care Management Pharmacy Assistant   Name: Jo [name]  MRN: [id number] DOB: 12/07/1960  Reason for Encounter: Medication Review   Received call from [facility].  Patient requested an acute fill for Methadone 5 mg tablets be delivered: 01/20/2021 Pharmacy needs refills? Yes  Confirmed delivery date of 02/11/2021, advised patient that pharmacy will contact them the morning of delivery.  Recent office visits:  None  Recent consult visits:  . 05.12.2022 Gatha Mayer, MD Gastroenterology patient present for colonoscopy . 05.11.2022 Phone call from gastroenterology o Medication prescribed metoclopramide (REGLAN) 10 MG tablet PRN . 05.10.2022 Gatha Mayer, MD Gastroenterology patient seen for follow up for persistent vomiting and diarrhea period  o Medications prescribed o Sod Picosulfate-Mag Ox-Cit Acd 10-3.5-12 MG-GM /160ML 1 kit Oral As directed . 04.26.2022 Lucretia Kern, DO video visit. Patient seen for vomiting and diarrhea . 04.22.2022 Noralyn Pick, NP Gastroenterology patient present for follow up visit from abdominal pain and emergency department visit on 4/19. o medications changed o Promethazine HCl 25 mg Take one tablet every 8- 12 hours as needed.  o Nystatin 183358 UNIT/GM 1 application Topical 2 times daily (Change in therapy) o Tapentadol HCl 50 mg Oral Every 12 hours (No longer needed (for PRN medications))  Hospital visits:  None in previous 6 months  Medications: Outpatient Encounter Medications as of 02/04/2021  Medication Sig Note  . azelastine (ASTELIN) 0.1 % nasal spray Place 1 spray into both nostrils 2 (two) times daily. Use in each nostril as directed   . Belimumab (BENLYSTA) 200 MG/ML SOSY once a week.   . busPIRone (BUSPAR) 15 MG tablet TAKE 1/2 TABLET BY MOUTH AT BREAKFAST AND AT BEDTIME   . cholecalciferol (VITAMIN D) 1000 UNITS tablet Take 2,000 Units by mouth daily.   . clindamycin (CLEOCIN T) 1 % lotion Apply 1  application topically 2 (two) times daily as needed.   . Cranberry 200 MG CAPS Take 200 mg by mouth 2 (two) times daily. 09/25/2015: Received from: [facility]: Take by mouth.  . cyanocobalamin (,VITAMIN B-12,) 1000 MCG/ML injection INJECT ONE ML INTRAMUSCULARLY FOR ONE DOSE ALTERNATING EVERY TWO WEEKS, THEN MONHTLY   . erythromycin with ethanol (EMGEL) 2 % gel Apply topically 2 (two) times daily. (Patient taking differently: Apply topically 2 (two) times daily as needed.)   . estradiol (ESTRACE) 2 MG tablet TAKE ONE TABLET BY MOUTH EVERY MORNING   . fexofenadine-pseudoephedrine (ALLEGRA-D 24) 180-240 MG 24 hr tablet Take 1 tablet by mouth at bedtime.   . fluticasone (CUTIVATE) 0.05 % cream Apply 1 application topically 2 (two) times daily as needed (rash).    . fluticasone (FLONASE) 50 MCG/ACT nasal spray Place 2 sprays into both nostrils daily.   . folic acid (FOLVITE) 1 MG tablet Take 1 tablet (1 mg total) by mouth daily. (Patient taking differently: Take 1 mg by mouth in the morning and at bedtime.)   . HYDROmorphone (DILAUDID) 4 MG tablet Take by mouth every 4 (four) hours as needed for severe pain.   Marland Kitchen lamoTRIgine (LAMICTAL) 100 MG tablet Take 1 tablet (100 mg total) by mouth 2 (two) times daily.   Marland Kitchen levothyroxine (SYNTHROID) 125 MCG tablet TAKE 1 TABLET BY MOUTH EVERY DAY   . LYSINE PO Take 1 tablet by mouth daily.   . medroxyPROGESTERone (PROVERA) 2.5 MG tablet Take 2.5 mg by mouth daily.   . metFORMIN (GLUCOPHAGE) 500 MG tablet Take 1,000 mg by mouth daily with breakfast.   .  methadone (DOLOPHINE) 5 MG tablet Take 1 tablet by mouth 2 (two) times daily.   . methotrexate (RHEUMATREX) 2.5 MG tablet Take 4 tablets (10 mg total) by mouth once a week.   . metoCLOPramide (REGLAN) 10 MG tablet Take 1 tablet (10 mg total) by mouth every 6 (six) hours as needed for nausea.   . montelukast (SINGULAIR) 10 MG tablet TAKE 1 TABLET BY MOUTH EVERYDAY AT BEDTIME   . Omeprazole-Sodium  Bicarbonate (ZEGERID) 20-1100 MG CAPS capsule Take 1 capsule by mouth daily.   . ondansetron (ZOFRAN) 4 MG tablet Take 1 tablet (4 mg total) by mouth every 8 (eight) hours as needed for nausea.   . promethazine (PHENERGAN) 25 MG tablet Take 1 tablet (25 mg total) by mouth every 8 (eight) hours as needed for up to 20 doses for nausea or vomiting. Take one tablet every 8- 12 hours as needed.   . sertraline (ZOLOFT) 100 MG tablet TAKE TWO TABLETS BY MOUTH EVERY MORNING   . sucralfate (CARAFATE) 1 g tablet Take 1 tablet (1 g total) by mouth 4 (four) times daily -  with meals and at bedtime for 14 days.   . Syringe/Needle, Disp, (SYRINGE 3CC/25GX1-1/2") 25G X 1-1/2" 3 ML MISC Inject 1cc (1068mcg) B12 q 2-4 weeks as directed   . tiZANidine (ZANAFLEX) 4 MG capsule Take 3 capsules (12 mg total) by mouth at bedtime as needed for muscle spasms.   Marland Kitchen topiramate (TOPAMAX) 25 MG tablet TAKE 1 TABLET 5 TIMES A DAY (Patient not taking: Reported on 01/30/2021)   . triamcinolone cream (KENALOG) 0.1 % daily as needed.    Facility-Administered Encounter Medications as of 02/04/2021  Medication  . 0.9 %  sodium chloride infusion  . cyanocobalamin ((VITAMIN B-12)) injection 1,000 mcg   I spoke with the patient and reviewed medications. There are no changes in medications currently. The patient declined these medications this month due to PRN use/additional supply on hand. The patient is taking the following medications: last medication was fill need for 90 days on  03.26.2022 . Marland Kitchen Montelukast 10 mg: one tablet at bedtime . Levothyroxine 125 mcg: one tablet before breakfast . Metformin 1,000 mg: one tablets every morning  . Folic acid 1 mg: one tablet at breakfast and one at bedtime . Methotrexate 2.5 mg: 4 tablets on Friday at bedtime . Ondansetron (Zofran) 4 mg, 1 tablet every eight hours as needed for nausea . Methadone 5 mg:two tablet at breakfast and one at bedtime( patient reports) . Totipalmate 25 mg: one tablet  in the morning and three tablets at bedtime  . Tizanidine 4 mg: three tablets at bedtime   . Estradiol 2 mg:one at breakfast . Medroxyprogesterone 2.5 mg: one tablet daily . Buspirone (Buspar) 15 mg:  tablet at breakfast and  tab at bedtime . Sertraline 100 mg: two tablets at breakfast . Lamotrigine 100 mg: one table at breakfast and one at bedtime . Fluticasone (Flonase) 50 mcg/act nasal spray, 2 sprays into each nostril every day  . Cyanocobalamin 1000 mcg/ml, inject 1 ML into muscle every 2 weeks for 2 doses and then once every month . Fluticasone (CUTIVATE) 0.05 % cream: apply twice daily to needed area  Star Rating Drugs:  [facility]  Metformin 500 mg 03.26.2022 [facility]    Amilia Revonda Standard, [place] [phone]

## 2021-02-05 ENCOUNTER — Encounter: Payer: Self-pay | Admitting: Physician Assistant

## 2021-02-05 ENCOUNTER — Ambulatory Visit (INDEPENDENT_AMBULATORY_CARE_PROVIDER_SITE_OTHER): Payer: PPO | Admitting: Obstetrics & Gynecology

## 2021-02-05 ENCOUNTER — Encounter: Payer: Self-pay | Admitting: Obstetrics & Gynecology

## 2021-02-05 ENCOUNTER — Other Ambulatory Visit: Payer: Self-pay

## 2021-02-05 ENCOUNTER — Ambulatory Visit (INDEPENDENT_AMBULATORY_CARE_PROVIDER_SITE_OTHER): Payer: PPO

## 2021-02-05 ENCOUNTER — Ambulatory Visit: Payer: PPO | Admitting: Physician Assistant

## 2021-02-05 VITALS — BP 73/42 | HR 47 | Ht 64.0 in | Wt 139.3 lb

## 2021-02-05 VITALS — BP 103/67 | HR 57 | Ht 64.0 in | Wt 138.8 lb

## 2021-02-05 DIAGNOSIS — M797 Fibromyalgia: Secondary | ICD-10-CM

## 2021-02-05 DIAGNOSIS — R0789 Other chest pain: Secondary | ICD-10-CM | POA: Diagnosis not present

## 2021-02-05 DIAGNOSIS — N951 Menopausal and female climacteric states: Secondary | ICD-10-CM

## 2021-02-05 DIAGNOSIS — R002 Palpitations: Secondary | ICD-10-CM

## 2021-02-05 DIAGNOSIS — N9489 Other specified conditions associated with female genital organs and menstrual cycle: Secondary | ICD-10-CM | POA: Diagnosis not present

## 2021-02-05 MED ORDER — ESTRADIOL 2 MG PO TABS
1.0000 mg | ORAL_TABLET | Freq: Every morning | ORAL | 0 refills | Status: DC
Start: 2021-02-05 — End: 2021-06-13

## 2021-02-05 NOTE — Progress Notes (Signed)
Patient ID: Jo [name], female   DOB: Aug 30, 1961, 60 y.o.   MRN: [id number]  Chief Complaint  Patient presents with  . Gynecologic Exam    HPI Jo [name] is a 59 y.o. female.  No obstetric history on file. Postmenopausal 20 years, referred to evaluate CT that showed 7x7 mm endometrial mass possible polyp. No vaginal bleeding. She has lupus and IBS. She had been dx with endometriosis and PCOS, has HRT and metformin. Last pap was about 5 years ago HPI  Past Medical History:  Diagnosis Date  . Allergy   . Anemia   . Anxiety   . Anxiety and depression   . Arrhythmia 2021  . Arthritis   . Asthma   . Cervical cancer [facility])    age 57   . Chronic headaches   . Colon polyp   . COPD (chronic obstructive pulmonary disease) (HCC)    no o2  . Depression   . Endometriosis   . Fibromyalgia   . Fundic gland polyps of stomach, benign   . Gastritis   . GERD (gastroesophageal reflux disease)   . H. pylori infection 2013  . H/O gastric ulcer 1985  . History of anorexia nervosa   . History of bulimia   . History of pneumonia   . Hypothyroidism   . Irritable bowel syndrome (IBS)   . Obesity   . Personal history of other mental and behavioral disorders 01/14/2008   Qualifier: Diagnosis of  By: Carlean Purl MD, [name]   Overview:  Overview:  Annotation: BULIMIA Qualifier: Diagnosis of  By: Bertram Gala   . REFLEX SYMPATHETIC DYSTROPHY 02/26/2009   Qualifier: Diagnosis of  By: Carlean Purl MD, [name] Rheumatoid arteritis ([place])   . RSD (reflex sympathetic dystrophy)   . Sleep apnea   . Systemic lupus ([place]) 2021  . Ulcer    gastric, duodenal ulcer    Past Surgical History:  Procedure Laterality Date  . CERVICAL SPINE SURGERY  1990  . CERVIX SURGERY    . CHOLECYSTECTOMY    . COLONOSCOPY    . KNEE ARTHROSCOPY  04/21/2012   right  . LAPAROSCOPIC NISSEN FUNDOPLICATION  78/29/5621   Dr. Johnathan Hausen  . laparoscopy     for endometriosis  . lumbar spondylosis injected   2013   Dr. Mina Marble  . NASAL SINUS SURGERY    . THORACOSCOPY W/ THORACIC SYMPATHECTOMY     for RSD  . TONSILLECTOMY    . UPPER GASTROINTESTINAL ENDOSCOPY      Family History  Problem Relation Age of Onset  . Colon polyps Mother        part of intestines removed  . Raynaud syndrome Mother   . Irritable bowel syndrome Mother   . Other Mother        cold agluten  . Stroke Mother   . Colon polyps Brother   . Kidney Stones Brother   . Breast cancer Maternal Aunt   . Colon polyps Sister   . Lung cancer Maternal Grandfather   . Colon cancer Neg Hx   . Esophageal cancer Neg Hx   . Rectal cancer Neg Hx   . Stomach cancer Neg Hx   . Migraines Neg Hx   . Seizures Neg Hx     Social History Social History   Tobacco Use  . Smoking status: Never Smoker  . Smokeless tobacco: Never Used  Vaping Use  . Vaping Use: Never used  Substance  Use Topics  . Alcohol use: No  . Drug use: No    Allergies  Allergen Reactions  . Bee Pollen Anaphylaxis  . Diazepam     sucidal depression  . Prednisone Nausea And Vomiting    Per pt can not take oral steroids   . Amoxicillin     Severe diarrhea At high dose   . Doxycycline     Pt does not remember reaction  . Erythromycin     n & v  . Morphine     n & v  . Propoxyphene N-Acetaminophen     n & V  . Sulfa Antibiotics     MIGRAINES  . Tetracyclines & Related     N & v  . Propoxyphene Other (See Comments)    Current Outpatient Medications  Medication Sig Dispense Refill  . azelastine (ASTELIN) 0.1 % nasal spray Place 1 spray into both nostrils 2 (two) times daily. Use in each nostril as directed 30 mL 2  . busPIRone (BUSPAR) 15 MG tablet TAKE 1/2 TABLET BY MOUTH AT BREAKFAST AND AT BEDTIME 90 tablet 0  . cholecalciferol (VITAMIN D) 1000 UNITS tablet Take 2,000 Units by mouth daily.    . clindamycin (CLEOCIN T) 1 % lotion Apply 1 application topically 2 (two) times daily as needed.    . Cranberry 200 MG CAPS Take 200 mg by mouth 2  (two) times daily.    . cyanocobalamin (,VITAMIN B-12,) 1000 MCG/ML injection INJECT ONE ML INTRAMUSCULARLY FOR ONE DOSE ALTERNATING EVERY TWO WEEKS, THEN MONHTLY 10 mL 3  . erythromycin with ethanol (EMGEL) 2 % gel Apply topically 2 (two) times daily. (Patient taking differently: Apply topically 2 (two) times daily as needed.) 30 g 1  . fexofenadine-pseudoephedrine (ALLEGRA-D 24) 180-240 MG 24 hr tablet Take 1 tablet by mouth at bedtime.    . fluticasone (CUTIVATE) 0.05 % cream Apply 1 application topically 2 (two) times daily as needed (rash).     . fluticasone (FLONASE) 50 MCG/ACT nasal spray Place 2 sprays into both nostrils daily. 16 g 5  . folic acid (FOLVITE) 1 MG tablet Take 1 tablet (1 mg total) by mouth daily. (Patient taking differently: Take 1 mg by mouth in the morning and at bedtime.) 30 tablet 6  . HYDROmorphone (DILAUDID) 4 MG tablet Take by mouth every 4 (four) hours as needed for severe pain.    Marland Kitchen lamoTRIgine (LAMICTAL) 100 MG tablet Take 1 tablet (100 mg total) by mouth 2 (two) times daily. 180 tablet 3  . levothyroxine (SYNTHROID) 125 MCG tablet TAKE 1 TABLET BY MOUTH EVERY DAY 90 tablet 1  . LYSINE PO Take 1 tablet by mouth daily.    . medroxyPROGESTERone (PROVERA) 2.5 MG tablet Take 2.5 mg by mouth daily.    . metFORMIN (GLUCOPHAGE) 500 MG tablet Take 1,000 mg by mouth daily with breakfast.    . methadone (DOLOPHINE) 5 MG tablet Take 1 tablet by mouth 2 (two) times daily.    . metoCLOPramide (REGLAN) 10 MG tablet Take 1 tablet (10 mg total) by mouth every 6 (six) hours as needed for nausea. 30 tablet 0  . montelukast (SINGULAIR) 10 MG tablet TAKE 1 TABLET BY MOUTH EVERYDAY AT BEDTIME 90 tablet 3  . Omeprazole-Sodium Bicarbonate (ZEGERID) 20-1100 MG CAPS capsule Take 1 capsule by mouth daily. 90 capsule 1  . ondansetron (ZOFRAN) 4 MG tablet Take 1 tablet (4 mg total) by mouth every 8 (eight) hours as needed for nausea. 30 tablet 3  .  promethazine (PHENERGAN) 25 MG tablet Take 1  tablet (25 mg total) by mouth every 8 (eight) hours as needed for up to 20 doses for nausea or vomiting. Take one tablet every 8- 12 hours as needed. 20 tablet 0  . sertraline (ZOLOFT) 100 MG tablet TAKE TWO TABLETS BY MOUTH EVERY MORNING 180 tablet 0  . Syringe/Needle, Disp, (SYRINGE 3CC/25GX1-1/2") 25G X 1-1/2" 3 ML MISC Inject 1cc (1065mcg) B12 q 2-4 weeks as directed 100 each 3  . tiZANidine (ZANAFLEX) 4 MG capsule Take 3 capsules (12 mg total) by mouth at bedtime as needed for muscle spasms. 90 capsule 1  . topiramate (TOPAMAX) 25 MG tablet TAKE 1 TABLET 5 TIMES A DAY 150 tablet 1  . triamcinolone cream (KENALOG) 0.1 % daily as needed.    . Belimumab (BENLYSTA) 200 MG/ML SOSY once a week. (Patient not taking: Reported on 02/05/2021)    . estradiol (ESTRACE) 2 MG tablet Take 0.5 tablets (1 mg total) by mouth every morning. 90 tablet 0  . methotrexate (RHEUMATREX) 2.5 MG tablet Take 4 tablets (10 mg total) by mouth once a week. (Patient not taking: Reported on 02/05/2021) 4 tablet   . sucralfate (CARAFATE) 1 g tablet Take 1 tablet (1 g total) by mouth 4 (four) times daily -  with meals and at bedtime for 14 days. 56 tablet 0   Current Facility-Administered Medications  Medication Dose Route Frequency Provider Last Rate Last Admin  . 0.9 %  sodium chloride infusion  500 mL Intravenous Once Gatha Mayer, MD      . cyanocobalamin ((VITAMIN B-12)) injection 1,000 mcg  1,000 mcg Intramuscular Q14 Days Koberlein, Junell C, MD   1,000 mcg at 05/25/18 1244    Review of Systems Review of Systems  Constitutional: Positive for fatigue.  Gastrointestinal: Positive for abdominal pain and diarrhea.  Genitourinary: Negative for pelvic pain, vaginal bleeding and vaginal discharge.    Blood pressure (!) 73/42, pulse (!) 47, height 5\' 4"  (1.626 m), weight 139 lb 4.8 oz (63.2 kg).  Physical Exam Physical Exam Vitals and nursing note reviewed.  Constitutional:      Appearance: She is not ill-appearing.   Pulmonary:     Effort: Pulmonary effort is normal.  Neurological:     Mental Status: She is alert.  Psychiatric:        Mood and Affect: Mood normal.        Behavior: Behavior normal.     Data Reviewed Narrative & Impression  CLINICAL DATA:  Abdominal pain with nausea and vomiting.  EXAM: CT ABDOMEN AND PELVIS WITH CONTRAST  TECHNIQUE: Multidetector CT imaging of the abdomen and pelvis was performed using the standard protocol following bolus administration of intravenous contrast.  CONTRAST:  128mL OMNIPAQUE IOHEXOL 300 MG/ML  SOLN  COMPARISON:  CT abdomen pelvis dated December 11, 2019.  FINDINGS: Lower chest: No acute abnormality.  Hepatobiliary: No focal liver abnormality. Status post cholecystectomy. Unchanged mild intra- and extrahepatic biliary dilatation likely related to reservoir effect.  Pancreas: Unchanged moderate atrophy. No ductal dilatation or surrounding inflammatory changes.  Spleen: Normal in size without focal abnormality.  Adrenals/Urinary Tract: Adrenal glands are unremarkable. Unchanged small left renal cyst. Kidneys are otherwise normal, without renal calculi, focal lesion, or hydronephrosis. Bladder is unremarkable.  Stomach/Bowel: Stomach is within normal limits. Appendix appears normal. No evidence of bowel wall thickening, distention, or inflammatory changes.  Vascular/Lymphatic: No significant vascular findings are present. No enlarged abdominal or pelvic lymph nodes.  Reproductive: Oval 7  x 9 mm soft tissue density involving the endometrial canal within the lower uterine segment (series 3, image 76). No adnexal mass.  Other: No abdominal wall hernia or abnormality. No abdominopelvic ascites. No pneumoperitoneum.  Musculoskeletal: No acute or significant osseous findings.  IMPRESSION: 1. No acute intra-abdominal process. 2. Oval 7 x 9 mm soft tissue density involving the endometrial canal within the lower uterine  segment, potentially a polyp. Follow-up outpatient pelvic ultrasound is recommended.   Electronically Signed   By: Titus Dubin M.D.   On: 01/07/2021 18:08     Assessment Posilble endometrial polyp on CT, no PMP bleeding Needs nl gyn f/u  Plan Pelvic US F/U for Gyn exam, pap, and discuss her HRT, which we will decrease today Meds ordered this encounter  Medications  . estradiol (ESTRACE) 2 MG tablet    Sig: Take 0.5 tablets (1 mg total) by mouth every morning.    Dispense:  90 tablet    Refill:  0    Patient needs an appt      Emeterio Reeve 02/05/2021, 3:04 PM

## 2021-02-05 NOTE — Progress Notes (Signed)
Pelvic ultrasound has been scheduled for June 7, 22 at [facility] at 1:15pm. Patient is aware of appointment.   02/05/21 Jo [name], CMA

## 2021-02-05 NOTE — Patient Instructions (Signed)
https://www.womenshealth.gov/menopause/menopause-basics"> https://www.clinicalkey.com">  Menopause Menopause is the normal time of a woman's life when menstrual periods stop completely. It marks the natural end to a woman's ability to become pregnant. It can be defined as the absence of a menstrual period for 12 months without another medical cause. The transition to menopause (perimenopause) most often happens between the ages of 45 and 55, and can last for many years. During perimenopause, hormone levels change in your body, which can cause symptoms and affect your health. Menopause may increase your risk for:  Weakened bones (osteoporosis), which causes fractures.  Depression.  Hardening and narrowing of the arteries (atherosclerosis), which can cause heart attacks and strokes. What are the causes? This condition is usually caused by a natural change in hormone levels that happens as you get older. The condition may also be caused by changes that are not natural, including:  Surgery to remove both ovaries (surgical menopause).  Side effects from some medicines, such as chemotherapy used to treat cancer (chemical menopause). What increases the risk? This condition is more likely to start at an earlier age if you have certain medical conditions or have undergone treatments, including:  A tumor of the pituitary gland in the brain.  A disease that affects the ovaries and hormones.  Certain cancer treatments, such as chemotherapy or hormone therapy, or radiation therapy on the pelvis.  Heavy smoking and excessive alcohol use.  Family history of early menopause. This condition is also more likely to develop earlier in women who are very thin. What are the signs or symptoms? Symptoms of this condition include:  Hot flashes.  Irregular menstrual periods.  Night sweats.  Changes in feelings about sex. This could be a decrease in sex drive or an increased discomfort around your  sexuality.  Vaginal dryness and thinning of the vaginal walls. This may cause painful sex.  Dryness of the skin and development of wrinkles.  Headaches.  Problems sleeping (insomnia).  Mood swings or irritability.  Memory problems.  Weight gain.  Hair growth on the face and chest.  Bladder infections or problems with urinating. How is this diagnosed? This condition is diagnosed based on your medical history, a physical exam, your age, your menstrual history, and your symptoms. Hormone tests may also be done. How is this treated? In some cases, no treatment is needed. You and your health care provider should make a decision together about whether treatment is necessary. Treatment will be based on your individual condition and preferences. Treatment for this condition focuses on managing symptoms. Treatment may include:  Menopausal hormone therapy (MHT).  Medicines to treat specific symptoms or complications.  Acupuncture.  Vitamin or herbal supplements. Before starting treatment, make sure to let your health care provider know if you have a personal or family history of these conditions:  Heart disease.  Breast cancer.  Blood clots.  Diabetes.  Osteoporosis. Follow these instructions at home: Lifestyle  Do not use any products that contain nicotine or tobacco, such as cigarettes, e-cigarettes, and chewing tobacco. If you need help quitting, ask your health care provider.  Get at least 30 minutes of physical activity on 5 or more days each week.  Avoid alcoholic and caffeinated beverages, as well as spicy foods. This may help prevent hot flashes.  Get 7-8 hours of sleep each night.  If you have hot flashes, try: ? Dressing in layers. ? Avoiding things that may trigger hot flashes, such as spicy food, warm places, or stress. ? Taking slow, deep   breaths when a hot flash starts. ? Keeping a fan in your home and office.  Find ways to manage stress, such as deep  breathing, meditation, or journaling.  Consider going to group therapy with other women who are having menopause symptoms. Ask your health care provider about recommended group therapy meetings. Eating and drinking  Eat a healthy, balanced diet that contains whole grains, lean protein, low-fat dairy, and plenty of fruits and vegetables.  Your health care provider may recommend adding more soy to your diet. Foods that contain soy include tofu, tempeh, and soy milk.  Eat plenty of foods that contain calcium and vitamin D for bone health. Items that are rich in calcium include low-fat milk, yogurt, beans, almonds, sardines, broccoli, and kale.   Medicines  Take over-the-counter and prescription medicines only as told by your health care provider.  Talk with your health care provider before starting any herbal supplements. If prescribed, take vitamins and supplements as told by your health care provider. General instructions  Keep track of your menstrual periods, including: ? When they occur. ? How heavy they are and how long they last. ? How much time passes between periods.  Keep track of your symptoms, noting when they start, how often you have them, and how long they last.  Use vaginal lubricants or moisturizers to help with vaginal dryness and improve comfort during sex.  Keep all follow-up visits. This is important. This includes any group therapy or counseling.   Contact a health care provider if:  You are still having menstrual periods after age 55.  You have pain during sex.  You have not had a period for 12 months and you develop vaginal bleeding. Get help right away if you have:  Severe depression.  Excessive vaginal bleeding.  Pain when you urinate.  A fast or irregular heartbeat (palpitations).  Severe headaches.  Abdominal pain or severe indigestion. Summary  Menopause is a normal time of life when menstrual periods stop completely. It is usually defined as  the absence of a menstrual period for 12 months without another medical cause.  The transition to menopause (perimenopause) most often happens between the ages of 45 and 55 and can last for several years.  Symptoms can be managed through medicines, lifestyle changes, and complementary therapies such as acupuncture.  Eat a balanced diet that is rich in nutrients to promote bone health and heart health and to manage symptoms during menopause. This information is not intended to replace advice given to you by your health care provider. Make sure you discuss any questions you have with your health care provider. Document Revised: 06/07/2020 Document Reviewed: 02/22/2020 Elsevier Patient Education  2021 Elsevier Inc.  

## 2021-02-05 NOTE — Progress Notes (Signed)
Cardiology Office Note:    Date:  02/06/2021   ID:  Jo [name], DOB 1961/02/21, MRN [id number]  PCP:  Caren Macadam, MD   [facility] Providers Cardiologist:  Quay Burow, MD {    Referring MD: Caren Macadam, MD   Chief Complaint  Patient presents with  . Follow-up    Seen for Dr. Gwenlyn Found    History of Present Illness:    Jo [name] is a 60 y.o. female with a hx of syncope, fibromyalgia, GERD, COPD, obstructive sleep apnea and lupus.  Patient was last seen virtually by Dr. Gwenlyn Found on 12/21/2018 for evaluation of syncope.  Prior to that, she never had a heart attack or stroke.  She did complain of atypical chest pain during the previous visit.  Coronary calcium scoring and heart monitor was recommended.  Heart monitor showed only occasional PAC and PVC.  Coronary calcium score was 0.  More recently, family member called in for her complaining of heart palpitation and chest discomfort along with shortness of breath.  She reportedly lost close to 25 pounds recently.  Recent TSH obtained on 01/30/2021 was 0.41, which was normal.   Patient presents today for follow-up.  She had a lupus flare last year and significant fibromyalgia this year.  She has body ache all over.  In the past few months, she has been having GI issues including nausea, loss of appetite and diarrhea.  She says she has lost more than 40 pounds in the past several months.  She has been having atypical chest pain that is worse with body rotation and palpation, this is appears to be musculoskeletal in etiology, I would not recommend further work-up given reassuring coronary calcium score of 0 in 2020.  She also complains of intermittent palpitation.  She says while she was wearing the heart monitor previously, her palpitation actually resolved during that 2 weeks.  In the past several months, she has been noticing increased palpitation on a almost daily basis.  I will run another 2-week heart monitor with live  monitoring.  Patient has been instructed to press on the button if she does feel with the palpitations so we can correlate with underlying rhythm.  Otherwise, we will see the patient back in 5 weeks.  Past Medical History:  Diagnosis Date  . Allergy   . Anemia   . Anxiety   . Anxiety and depression   . Arrhythmia 2021  . Arthritis   . Asthma   . Cervical cancer [facility])    age 41   . Chronic headaches   . Colon polyp   . COPD (chronic obstructive pulmonary disease) (HCC)    no o2  . Depression   . Endometriosis   . Fibromyalgia   . Fundic gland polyps of stomach, benign   . Gastritis   . GERD (gastroesophageal reflux disease)   . H. pylori infection 2013  . H/O gastric ulcer 1985  . History of anorexia nervosa   . History of bulimia   . History of pneumonia   . Hypothyroidism   . Irritable bowel syndrome (IBS)   . Obesity   . Personal history of other mental and behavioral disorders 01/14/2008   Qualifier: Diagnosis of  By: Carlean Purl MD, [name]   Overview:  Overview:  Annotation: BULIMIA Qualifier: Diagnosis of  By: Bertram Gala   . REFLEX SYMPATHETIC DYSTROPHY 02/26/2009   Qualifier: Diagnosis of  By: Carlean Purl MD, [name]  Rheumatoid arteritis ([place])   . RSD (reflex sympathetic dystrophy)   . Sleep apnea   . Systemic lupus ([place]) 2021  . Ulcer    gastric, duodenal ulcer    Past Surgical History:  Procedure Laterality Date  . CERVICAL SPINE SURGERY  1990  . CERVIX SURGERY    . CHOLECYSTECTOMY    . COLONOSCOPY    . KNEE ARTHROSCOPY  04/21/2012   right  . LAPAROSCOPIC NISSEN FUNDOPLICATION  09/38/1829   Dr. Johnathan Hausen  . laparoscopy     for endometriosis  . lumbar spondylosis injected  2013   Dr. Mina Marble  . NASAL SINUS SURGERY    . THORACOSCOPY W/ THORACIC SYMPATHECTOMY     for RSD  . TONSILLECTOMY    . UPPER GASTROINTESTINAL ENDOSCOPY      Current Medications: Current Meds  Medication Sig  . azelastine (ASTELIN) 0.1 % nasal spray Place 1  spray into both nostrils 2 (two) times daily. Use in each nostril as directed  . Belimumab (BENLYSTA) 200 MG/ML SOSY once a week. (Patient not taking: Reported on 02/05/2021)  . busPIRone (BUSPAR) 15 MG tablet TAKE 1/2 TABLET BY MOUTH AT BREAKFAST AND AT BEDTIME  . cholecalciferol (VITAMIN D) 1000 UNITS tablet Take 2,000 Units by mouth daily.  . clindamycin (CLEOCIN T) 1 % lotion Apply 1 application topically 2 (two) times daily as needed.  . Cranberry 200 MG CAPS Take 200 mg by mouth 2 (two) times daily.  . cyanocobalamin (,VITAMIN B-12,) 1000 MCG/ML injection INJECT ONE ML INTRAMUSCULARLY FOR ONE DOSE ALTERNATING EVERY TWO WEEKS, THEN MONHTLY  . erythromycin with ethanol (EMGEL) 2 % gel Apply topically 2 (two) times daily. (Patient taking differently: Apply topically 2 (two) times daily as needed.)  . fexofenadine-pseudoephedrine (ALLEGRA-D 24) 180-240 MG 24 hr tablet Take 1 tablet by mouth at bedtime.  . fluticasone (CUTIVATE) 0.05 % cream Apply 1 application topically 2 (two) times daily as needed (rash).   . fluticasone (FLONASE) 50 MCG/ACT nasal spray Place 2 sprays into both nostrils daily.  . folic acid (FOLVITE) 1 MG tablet Take 1 tablet (1 mg total) by mouth daily. (Patient taking differently: Take 1 mg by mouth in the morning and at bedtime.)  . HYDROmorphone (DILAUDID) 4 MG tablet Take by mouth every 4 (four) hours as needed for severe pain.  Marland Kitchen lamoTRIgine (LAMICTAL) 100 MG tablet Take 1 tablet (100 mg total) by mouth 2 (two) times daily.  Marland Kitchen levothyroxine (SYNTHROID) 125 MCG tablet TAKE 1 TABLET BY MOUTH EVERY DAY  . LYSINE PO Take 1 tablet by mouth daily.  . medroxyPROGESTERone (PROVERA) 2.5 MG tablet Take 2.5 mg by mouth daily.  . metFORMIN (GLUCOPHAGE) 500 MG tablet Take 1,000 mg by mouth daily with breakfast.  . methadone (DOLOPHINE) 5 MG tablet Take 1 tablet by mouth 2 (two) times daily.  . methotrexate (RHEUMATREX) 2.5 MG tablet Take 4 tablets (10 mg total) by mouth once a week.  (Patient not taking: Reported on 02/05/2021)  . metoCLOPramide (REGLAN) 10 MG tablet Take 1 tablet (10 mg total) by mouth every 6 (six) hours as needed for nausea.  . montelukast (SINGULAIR) 10 MG tablet TAKE 1 TABLET BY MOUTH EVERYDAY AT BEDTIME  . Omeprazole-Sodium Bicarbonate (ZEGERID) 20-1100 MG CAPS capsule Take 1 capsule by mouth daily.  . ondansetron (ZOFRAN) 4 MG tablet Take 1 tablet (4 mg total) by mouth every 8 (eight) hours as needed for nausea.  . promethazine (PHENERGAN) 25 MG tablet Take 1 tablet (25 mg total)  by mouth every 8 (eight) hours as needed for up to 20 doses for nausea or vomiting. Take one tablet every 8- 12 hours as needed.  . sertraline (ZOLOFT) 100 MG tablet TAKE TWO TABLETS BY MOUTH EVERY MORNING  . Syringe/Needle, Disp, (SYRINGE 3CC/25GX1-1/2") 25G X 1-1/2" 3 ML MISC Inject 1cc (1071mcg) B12 q 2-4 weeks as directed  . tiZANidine (ZANAFLEX) 4 MG capsule Take 3 capsules (12 mg total) by mouth at bedtime as needed for muscle spasms.  Marland Kitchen topiramate (TOPAMAX) 25 MG tablet TAKE 1 TABLET 5 TIMES A DAY  . triamcinolone cream (KENALOG) 0.1 % daily as needed.  . [DISCONTINUED] estradiol (ESTRACE) 2 MG tablet TAKE ONE TABLET BY MOUTH EVERY MORNING   Current Facility-Administered Medications for the 02/05/21 encounter (Office Visit) with Almyra Deforest, PA  Medication  . 0.9 %  sodium chloride infusion  . cyanocobalamin ((VITAMIN B-12)) injection 1,000 mcg     Allergies:   Bee pollen, Diazepam, Prednisone, Amoxicillin, Doxycycline, Erythromycin, Morphine, Propoxyphene n-acetaminophen, Sulfa antibiotics, Tetracyclines & related, and Propoxyphene   Social History   Socioeconomic History  . Marital status: Divorced    Spouse name: Not on file  . Number of children: 0  . Years of education: 4  . Highest education level: Not on file  Occupational History  . Occupation: Dance movement psychotherapist: UNEMPLOYED  Tobacco Use  . Smoking status: Never Smoker  . Smokeless  tobacco: Never Used  Vaping Use  . Vaping Use: Never used  Substance and Sexual Activity  . Alcohol use: No  . Drug use: No  . Sexual activity: Not Currently  Other Topics Concern  . Not on file  Social History Narrative   Lives at home alone.   Caffeine use: Drinks coffee- 1 cup in morning   Ginger ale   Social Determinants of Health   Financial Resource Strain: Low Risk   . Difficulty of Paying Living Expenses: Not hard at all  Food Insecurity: No Food Insecurity  . Worried About Charity fundraiser in the Last Year: Never true  . Ran Out of Food in the Last Year: Never true  Transportation Needs: No Transportation Needs  . Lack of Transportation (Medical): No  . Lack of Transportation (Non-Medical): No  Physical Activity: Sufficiently Active  . Days of Exercise per Week: 6 days  . Minutes of Exercise per Session: 30 min  Stress: No Stress Concern Present  . Feeling of Stress : Not at all  Social Connections: Socially Isolated  . Frequency of Communication with Friends and Family: More than three times a week  . Frequency of Social Gatherings with Friends and Family: Once a week  . Attends Religious Services: Never  . Active Member of Clubs or Organizations: No  . Attends Archivist Meetings: Never  . Marital Status: Divorced     Family History: The patient's family history includes Breast cancer in her maternal aunt; Colon polyps in her brother, mother, and sister; Irritable bowel syndrome in her mother; Kidney Stones in her brother; Lung cancer in her maternal grandfather; Other in her mother; Raynaud syndrome in her mother; Stroke in her mother. There is no history of Colon cancer, Esophageal cancer, Rectal cancer, Stomach cancer, Migraines, or Seizures.  ROS:   Please see the history of present illness.     All other systems reviewed and are negative.  EKGs/Labs/Other Studies Reviewed:    The following studies were reviewed today:  N/A  EKG:  EKG  is  ordered today.  The ekg ordered today demonstrates normal sinus rhythm with PACs.  Recent Labs: 01/07/2021: ALT 9; BUN 8; Creatinine, Ser 0.98; Hemoglobin 13.2; Platelets 288; Potassium 3.9; Sodium 138 01/30/2021: TSH 0.41  Recent Lipid Panel    Component Value Date/Time   CHOL 175 11/15/2019 0949   TRIG 130.0 11/15/2019 0949   HDL 75.00 11/15/2019 0949   CHOLHDL 2 11/15/2019 0949   VLDL 26.0 11/15/2019 0949   LDLCALC 74 11/15/2019 0949     Risk Assessment/Calculations:       Physical Exam:    VS:  BP 103/67 (BP Location: Right Arm, Patient Position: Sitting)   Pulse (!) 57   Ht $R'5\' 4"'ya$  (1.626 m)   Wt 138 lb 12.8 oz (63 kg)   SpO2 96%   BMI 23.82 kg/m     Wt Readings from Last 3 Encounters:  02/05/21 139 lb 4.8 oz (63.2 kg)  02/05/21 138 lb 12.8 oz (63 kg)  01/30/21 139 lb (63 kg)     GEN:  Well nourished, well developed in no acute distress HEENT: Normal NECK: No JVD; No carotid bruits LYMPHATICS: No lymphadenopathy CARDIAC: RRR, no murmurs, rubs, gallops RESPIRATORY:  Clear to auscultation without rales, wheezing or rhonchi  ABDOMEN: Soft, non-tender, non-distended MUSCULOSKELETAL:  No edema; No deformity  SKIN: Warm and dry NEUROLOGIC:  Alert and oriented x 3 PSYCHIATRIC:  Normal affect   ASSESSMENT:    1. Palpitations   2. Atypical chest pain   3. Fibromyalgia    PLAN:    In order of problems listed above:  1. Palpitation: I recommended 2-week ZIO monitor to assess her symptoms  2. Atypical chest pain: No further work-up, symptom is clearly musculoskeletal in nature.  3. Fibromyalgia: Likely contributing to her chest pain as she described generalized pain all over her body.        Medication Adjustments/Labs and Tests Ordered: Current medicines are reviewed at length with the patient today.  Concerns regarding medicines are outlined above.  Orders Placed This Encounter  Procedures  . LONG TERM MONITOR-LIVE TELEMETRY (3-14 DAYS)  . Rhythm strip    No orders of the defined types were placed in this encounter.   Patient Instructions  Medication Instructions:  Your physician recommends that you continue on your current medications as directed. Please refer to the Current Medication list given to you today.  *If you need a refill on your cardiac medications before your next appointment, please call your pharmacy*  Lab Work: NONE ordered at this time of appointment   If you have labs (blood work) drawn today and your tests are completely normal, you will receive your results only by: Marland Kitchen MyChart Message (if you have MyChart) OR . A paper copy in the mail If you have any lab test that is abnormal or we need to change your treatment, we will call you to review the results.  Testing/Procedures:  ZIO AT Long term monitor-Live Telemetry  Your physician has requested you wear a ZIO patch monitor for 14 days.  This is a single patch monitor. [employer] supplies one patch monitor per enrollment. Additional stickers are not available.  Please do not apply patch if you will be having a Nuclear Stress Test, Echocardiogram, Cardiac CT, MRI, or Chest Xray during the time frame you would be wearing the monitor. The patch cannot be worn during these tests. You cannot remove and re-apply the ZIO AT patch monitor.   Your ZIO patch monitor will be sent  [employer] Ex from [employer] directly to your home address. The monitor may also be mailed to a PO BOX if home delivery is not available. It may take 3-5 days to receive your monitor after you have been enrolled.  Once you have received you monitor, please review enclosed instructions. Your monitor has already been registered assigning a specific monitor serial # to you.   Applying the monitor  Shave hair from upper left chest.  Hold abrader disc by orange tab. Rub abrader in 40 strokes over left upper chest as indicated in your monitor instructions.  Clean area with 4 enclosed alcohol pads. Use all  pads to ensure the area is cleaned thoroughly. Let dry.  Apply patch as indicated in monitor instructions. Patch will be placed under collarbone on left side of chest with arrow pointing upward.  Rub patch adhesive wings for 2 minutes. Remove the white label marked "1". Remove the white label marked "2". Rub patch adhesive wings for 2 additional minutes.  While looking in a mirror, press and release button in center of patch. A small green light will flash 3-4 times. This will be your only indicator the monitor has been turned on.  Do not shower for the first 24 hours. You may shower after the first 24 hours.  Press the button if you feel a symptom. You will hear a small click. Record Date, Time and Symptom in the Patient Log.   Starting the Gateway  In your kit there is a Hydrographic surveyor box the size of a cellphone. This is Airline pilot. It transmits all your recorded data to [facility]. This box must stay within 10 feet of you at all times. Open the box and push the * button. There will be a light that blinks orange and then green a few times. When the light stops blinking, the Gateway is connected to the ZIO patch.  Call [employer] at [phone] to confirm your monitor is transmitting.   Returning your monitor  Remove your patch and place it inside the [place]. In the lower half of the Gateway there is a white bag with prepaid postage on it. Place Gateway in bag and seal. Mail package back to [place] as soon as possible. Your physician should have your final report approximately 7 days after you have mailed back your monitor.   Call [place] at [phone] if you have questions regarding your ZIO AT patch monitor. Call them immediately if you see an orange light blinking on your monitor.  If your monitor falls off in less than 4 days contact our Monitor department at [phone]. If your monitor becomes loose or falls off after 4 days call [employer] at [phone] for  suggestions on securing your monitor.    Follow-Up: At [facility], you and your health needs are our priority.  As part of our continuing mission to provide you with exceptional heart care, we have created designated Provider Care Teams.  These Care Teams include your primary Cardiologist (physician) and Advanced Practice Providers (APPs -  Physician Assistants and Nurse Practitioners) who all work together to provide you with the care you need, when you need it.  We recommend signing up for the patient portal called "MyChart".  Sign up information is provided on this After Visit Summary.  MyChart is used to connect with patients for Virtual Visits (Telemedicine).  Patients are able to view lab/test results, encounter notes, upcoming appointments, etc.  Non-urgent messages can be sent to your provider  as well.   To learn more about what you can do with MyChart, go to [email or web address].     Your next appointment:   5 week(s)  The format for your next appointment:   In Person  Provider:   Almyra Deforest, PA-C  Other Instructions      Signed, Almyra Deforest, [place]  02/06/2021 11:06 PM    [place]

## 2021-02-05 NOTE — Patient Instructions (Addendum)
Medication Instructions:  Your physician recommends that you continue on your current medications as directed. Please refer to the Current Medication list given to you today.  *If you need a refill on your cardiac medications before your next appointment, please call your pharmacy*  Lab Work: NONE ordered at this time of appointment   If you have labs (blood work) drawn today and your tests are completely normal, you will receive your results only by: Marland Kitchen MyChart Message (if you have MyChart) OR . A paper copy in the mail If you have any lab test that is abnormal or we need to change your treatment, we will call you to review the results.  Testing/Procedures:  ZIO AT Long term monitor-Live Telemetry  Your physician has requested you wear a ZIO patch monitor for 14 days.  This is a single patch monitor. [employer] supplies one patch monitor per enrollment. Additional stickers are not available.  Please do not apply patch if you will be having a Nuclear Stress Test, Echocardiogram, Cardiac CT, MRI, or Chest Xray during the time frame you would be wearing the monitor. The patch cannot be worn during these tests. You cannot remove and re-apply the ZIO AT patch monitor.   Your ZIO patch monitor will be sent Fed Ex from [employer] directly to your home address. The monitor may also be mailed to a PO BOX if home delivery is not available. It may take 3-5 days to receive your monitor after you have been enrolled.  Once you have received you monitor, please review enclosed instructions. Your monitor has already been registered assigning a specific monitor serial # to you.   Applying the monitor  Shave hair from upper left chest.  Hold abrader disc by orange tab. Rub abrader in 40 strokes over left upper chest as indicated in your monitor instructions.  Clean area with 4 enclosed alcohol pads. Use all pads to ensure the area is cleaned thoroughly. Let dry.  Apply patch as indicated in monitor  instructions. Patch will be placed under collarbone on left side of chest with arrow pointing upward.  Rub patch adhesive wings for 2 minutes. Remove the white label marked "1". Remove the white label marked "2". Rub patch adhesive wings for 2 additional minutes.  While looking in a mirror, press and release button in center of patch. A small green light will flash 3-4 times. This will be your only indicator the monitor has been turned on.  Do not shower for the first 24 hours. You may shower after the first 24 hours.  Press the button if you feel a symptom. You will hear a small click. Record Date, Time and Symptom in the Patient Log.   Starting the Gateway  In your kit there is a Hydrographic surveyor box the size of a cellphone. This is Airline pilot. It transmits all your recorded data to [facility]. This box must stay within 10 feet of you at all times. Open the box and push the * button. There will be a light that blinks orange and then green a few times. When the light stops blinking, the Gateway is connected to the ZIO patch.  Call [employer] at [phone] to confirm your monitor is transmitting.   Returning your monitor  Remove your patch and place it inside the [place]. In the lower half of the Gateway there is a white bag with prepaid postage on it. Place Gateway in bag and seal. Mail package back to [place] as soon as possible.  Your physician should have your final report approximately 7 days after you have mailed back your monitor.   Call [place] at [phone] if you have questions regarding your ZIO AT patch monitor. Call them immediately if you see an orange light blinking on your monitor.  If your monitor falls off in less than 4 days contact our Monitor department at [phone]. If your monitor becomes loose or falls off after 4 days call [employer] at [phone] for suggestions on securing your monitor.    Follow-Up: At [facility], you and your health  needs are our priority.  As part of our continuing mission to provide you with exceptional heart care, we have created designated Provider Care Teams.  These Care Teams include your primary Cardiologist (physician) and Advanced Practice Providers (APPs -  Physician Assistants and Nurse Practitioners) who all work together to provide you with the care you need, when you need it.  We recommend signing up for the patient portal called "MyChart".  Sign up information is provided on this After Visit Summary.  MyChart is used to connect with patients for Virtual Visits (Telemedicine).  Patients are able to view lab/test results, encounter notes, upcoming appointments, etc.  Non-urgent messages can be sent to your provider as well.   To learn more about what you can do with MyChart, go to [email or web address].     Your next appointment:   5 week(s)  The format for your next appointment:   In Person  Provider:   Almyra Deforest, PA-C  Other Instructions

## 2021-02-05 NOTE — Progress Notes (Unsigned)
Patient enrolled for [employer] to mail a 14 day ZIO AT long term monitor-Live Telemetry to her home.

## 2021-02-06 ENCOUNTER — Encounter: Payer: Self-pay | Admitting: Physician Assistant

## 2021-02-09 DIAGNOSIS — R002 Palpitations: Secondary | ICD-10-CM | POA: Diagnosis not present

## 2021-02-10 ENCOUNTER — Telehealth: Payer: Self-pay

## 2021-02-10 DIAGNOSIS — R002 Palpitations: Secondary | ICD-10-CM | POA: Diagnosis not present

## 2021-02-10 NOTE — Telephone Encounter (Signed)
RN returned call regarding questions about labs.   RN attempted X 3, busy signal X 3.

## 2021-02-11 ENCOUNTER — Telehealth: Payer: Self-pay | Admitting: Internal Medicine

## 2021-02-11 MED ORDER — PROMETHAZINE HCL 25 MG PO TABS: 25.0000 mg | ORAL_TABLET | Freq: Three times a day (TID) | ORAL | 0 refills | Status: DC | PRN

## 2021-02-11 NOTE — Telephone Encounter (Signed)
Inbound call from patient stating patient is completely out of promethazine medication and is requesting additional refills please.

## 2021-02-11 NOTE — Addendum Note (Signed)
Addended by: Jacqulynn Cadet on: 02/11/2021 10:24 AM   Modules accepted: Orders

## 2021-02-11 NOTE — Telephone Encounter (Signed)
I called and told Jo [name] I sent in her promethazine to [facility] (faxed it to them)

## 2021-02-12 ENCOUNTER — Inpatient Hospital Stay: Payer: PPO | Attending: Adult Health

## 2021-02-13 ENCOUNTER — Other Ambulatory Visit: Payer: Self-pay | Admitting: Internal Medicine

## 2021-02-13 MED ORDER — ALOSETRON HCL 0.5 MG PO TABS: 0.5000 mg | ORAL_TABLET | Freq: Two times a day (BID) | ORAL | 1 refills | Status: DC

## 2021-02-24 ENCOUNTER — Ambulatory Visit
Admission: RE | Admit: 2021-02-24 | Discharge: 2021-02-24 | Disposition: A | Discharge status: Home / Self Care | Payer: PPO | Source: Ambulatory Visit | Attending: Orthopedic Surgery | Admitting: Orthopedic Surgery

## 2021-02-24 ENCOUNTER — Other Ambulatory Visit: Payer: Self-pay

## 2021-02-24 DIAGNOSIS — M79642 Pain in left hand: Secondary | ICD-10-CM | POA: Diagnosis not present

## 2021-02-24 DIAGNOSIS — R531 Weakness: Secondary | ICD-10-CM | POA: Diagnosis not present

## 2021-02-24 DIAGNOSIS — M25532 Pain in left wrist: Secondary | ICD-10-CM

## 2021-02-25 ENCOUNTER — Ambulatory Visit (HOSPITAL_COMMUNITY): Payer: PPO

## 2021-02-25 DIAGNOSIS — R5383 Other fatigue: Secondary | ICD-10-CM | POA: Diagnosis not present

## 2021-02-25 DIAGNOSIS — R768 Other specified abnormal immunological findings in serum: Secondary | ICD-10-CM | POA: Diagnosis not present

## 2021-02-25 DIAGNOSIS — M791 Myalgia, unspecified site: Secondary | ICD-10-CM | POA: Diagnosis not present

## 2021-02-25 DIAGNOSIS — M329 Systemic lupus erythematosus, unspecified: Secondary | ICD-10-CM | POA: Diagnosis not present

## 2021-02-25 DIAGNOSIS — G9050 Complex regional pain syndrome I, unspecified: Secondary | ICD-10-CM | POA: Diagnosis not present

## 2021-02-25 DIAGNOSIS — M1990 Unspecified osteoarthritis, unspecified site: Secondary | ICD-10-CM | POA: Diagnosis not present

## 2021-02-25 DIAGNOSIS — G8929 Other chronic pain: Secondary | ICD-10-CM | POA: Diagnosis not present

## 2021-02-25 DIAGNOSIS — K219 Gastro-esophageal reflux disease without esophagitis: Secondary | ICD-10-CM | POA: Diagnosis not present

## 2021-02-25 DIAGNOSIS — M359 Systemic involvement of connective tissue, unspecified: Secondary | ICD-10-CM | POA: Diagnosis not present

## 2021-02-25 DIAGNOSIS — I7300 Raynaud's syndrome without gangrene: Secondary | ICD-10-CM | POA: Diagnosis not present

## 2021-02-25 DIAGNOSIS — Z79899 Other long term (current) drug therapy: Secondary | ICD-10-CM | POA: Diagnosis not present

## 2021-03-04 ENCOUNTER — Other Ambulatory Visit: Payer: Self-pay | Admitting: Family Medicine

## 2021-03-04 DIAGNOSIS — F341 Dysthymic disorder: Secondary | ICD-10-CM

## 2021-03-04 DIAGNOSIS — J302 Other seasonal allergic rhinitis: Secondary | ICD-10-CM

## 2021-03-04 DIAGNOSIS — E039 Hypothyroidism, unspecified: Secondary | ICD-10-CM

## 2021-03-04 DIAGNOSIS — N951 Menopausal and female climacteric states: Secondary | ICD-10-CM

## 2021-03-04 DIAGNOSIS — E282 Polycystic ovarian syndrome: Secondary | ICD-10-CM

## 2021-03-06 ENCOUNTER — Telehealth: Payer: Self-pay | Admitting: Pharmacist

## 2021-03-06 NOTE — Chronic Care Management (AMB) (Signed)
Chronic Care Management Pharmacy Assistant   Name: Jo [name]  MRN: [id number] DOB: 1961-06-18  Reason for Encounter: Medication Review/ Medication Coordination Call.    Recent office visits:  None.   Recent consult visits:  02/05/21 Emeterio Reeve MD (Obstetrics and Gynecology) - seen for intial consult for endometrial mass. No medication changes and follow up in 6 weeks.   02/05/21 Almyra Deforest PA (Cardiology) -  seen for palpitations and other issues. No medication changes. Follow up in 5 weeks.   Hospital visits:  None in previous 6 months  Medications: Outpatient Encounter Medications as of 03/06/2021  Medication Sig   alosetron (LOTRONEX) 0.5 MG tablet Take 1 tablet (0.5 mg total) by mouth 2 (two) times daily. Stop if severe constipation or abdominal pain   azelastine (ASTELIN) 0.1 % nasal spray Place 1 spray into both nostrils 2 (two) times daily. Use in each nostril as directed   Belimumab (BENLYSTA) 200 MG/ML SOSY once a week. (Patient not taking: Reported on 02/05/2021)   busPIRone (BUSPAR) 15 MG tablet TAKE 1/2 TABLET BY MOUTH AT BREAKFAST AND AT BEDTIME   cholecalciferol (VITAMIN D) 1000 UNITS tablet Take 2,000 Units by mouth daily.   clindamycin (CLEOCIN T) 1 % lotion Apply 1 application topically 2 (two) times daily as needed.   Cranberry 200 MG CAPS Take 200 mg by mouth 2 (two) times daily.   cyanocobalamin (,VITAMIN B-12,) 1000 MCG/ML injection INJECT ONE ML INTRAMUSCULARLY FOR ONE DOSE ALTERNATING EVERY TWO WEEKS, THEN MONHTLY   erythromycin with ethanol (EMGEL) 2 % gel Apply topically 2 (two) times daily. (Patient taking differently: Apply topically 2 (two) times daily as needed.)   estradiol (ESTRACE) 2 MG tablet Take 0.5 tablets (1 mg total) by mouth every morning.   fexofenadine-pseudoephedrine (ALLEGRA-D 24) 180-240 MG 24 hr tablet Take 1 tablet by mouth at bedtime.   fluticasone (CUTIVATE) 0.05 % cream Apply 1 application topically 2 (two) times daily as needed  (rash).    fluticasone (FLONASE) 50 MCG/ACT nasal spray Place 2 sprays into both nostrils daily.   folic acid (FOLVITE) 1 MG tablet Take 1 tablet (1 mg total) by mouth daily. (Patient taking differently: Take 1 mg by mouth in the morning and at bedtime.)   HYDROmorphone (DILAUDID) 4 MG tablet Take by mouth every 4 (four) hours as needed for severe pain.   lamoTRIgine (LAMICTAL) 100 MG tablet TAKE ONE TABLET BY MOUTH AT BREAKFAST AND AT BEDTIME   levothyroxine (SYNTHROID) 125 MCG tablet TAKE ONE TABLET BY MOUTH EVERY MORNING   LYSINE PO Take 1 tablet by mouth daily.   medroxyPROGESTERone (PROVERA) 2.5 MG tablet Take 2.5 mg by mouth daily.   metFORMIN (GLUCOPHAGE) 500 MG tablet Take 1,000 mg by mouth daily with breakfast.   methadone (DOLOPHINE) 5 MG tablet Take 1 tablet by mouth 2 (two) times daily.   methotrexate (RHEUMATREX) 2.5 MG tablet Take 4 tablets (10 mg total) by mouth once a week. (Patient not taking: Reported on 02/05/2021)   metoCLOPramide (REGLAN) 10 MG tablet Take 1 tablet (10 mg total) by mouth every 6 (six) hours as needed for nausea.   montelukast (SINGULAIR) 10 MG tablet TAKE ONE TABLET BY MOUTH EVERYDAY AT BEDTIME   Omeprazole-Sodium Bicarbonate (ZEGERID) 20-1100 MG CAPS capsule Take 1 capsule by mouth daily.   ondansetron (ZOFRAN) 4 MG tablet Take 1 tablet (4 mg total) by mouth every 8 (eight) hours as needed for nausea.   promethazine (PHENERGAN) 25 MG tablet Take 1 tablet (  25 mg total) by mouth every 8 (eight) hours as needed for up to 20 doses for nausea or vomiting. Take one tablet every 8- 12 hours as needed.   sertraline (ZOLOFT) 100 MG tablet TAKE TWO TABLETS BY MOUTH EVERY MORNING   sucralfate (CARAFATE) 1 g tablet Take 1 tablet (1 g total) by mouth 4 (four) times daily -  with meals and at bedtime for 14 days.   Syringe/Needle, Disp, (SYRINGE 3CC/25GX1-1/2") 25G X 1-1/2" 3 ML MISC Inject 1cc (1022mcg) B12 q 2-4 weeks as directed   tiZANidine (ZANAFLEX) 4 MG capsule Take  3 capsules (12 mg total) by mouth at bedtime as needed for muscle spasms.   topiramate (TOPAMAX) 25 MG tablet TAKE 1 TABLET 5 TIMES A DAY   triamcinolone cream (KENALOG) 0.1 % daily as needed.   Facility-Administered Encounter Medications as of 03/06/2021  Medication   cyanocobalamin ((VITAMIN B-12)) injection 1,000 mcg    Reviewed chart for medication changes ahead of medication coordination call.  No OVs, Consults, or hospital visits since last care coordination call/Pharmacist visit. (If appropriate, list visit date, provider name)  No medication changes indicated OR if recent visit, treatment plan here.  BP Readings from Last 3 Encounters:  02/05/21 (!) 73/42  02/05/21 103/67  01/30/21 108/69    Lab Results  Component Value Date   HGBA1C 5.6 11/15/2019     Patient obtains medications through Adherence Packaging  90 Days   Last adherence delivery included: (medication name and frequency)  Patient declined (meds) last month due to PRN use/additional supply on hand. Explanation of abundance on hand (ie #30 due to overlapping fills or previous adherence issues etc)  Patient is due for next adherence delivery on: Marland Kitchen Called patient and reviewed medications and coordinated delivery.  This delivery to include: Patient will need a short fill of (med), prior to adherence delivery. (To align with sync date or if PRN med)  Coordinated acute fill for (med) to be delivered (date).  Patient declined the following medications (meds) due to (reason)  Patient needs refills for   Confirmed delivery date of  advised patient that pharmacy will contact them the morning of delivery.   Notes: Spoke with patient this morning who rescheduled her appointment with maddie to 2pm 03/10/21 instead of 12 message and task sent to [facility] to complete med sync at appointment.   Star Rating Drugs:  [place]  Clinical Pharmacist Assistant [phone]

## 2021-03-07 ENCOUNTER — Telehealth: Payer: Self-pay | Admitting: Pharmacist

## 2021-03-07 NOTE — Chronic Care Management (AMB) (Signed)
    Chronic Care Management Pharmacy Assistant   Name: ASLI TOKARSKI  MRN: [id number] DOB: 11-Mar-1961  03/07/21- Called patient to remind of appointment with [name]) on (03/10/21 at 2pm by phone)   Patient aware of appointment date, time, and type of appointment (either telephone or in person). Patient aware to have/bring all medications, supplements, blood pressure and/or blood sugar logs to visit.  Message sent to [facility] to reschedule patient Monday to 2pm instead of 12 pm   Questions: Have you had any recent office visit or specialist visit outside of [place]?  Yes her orthopedist at [facility]. Are there any concerns you would like to discuss during your office visit? She has went off her methotrexate because it was making her sick. She said her topamax is suppose to be four at night and not 3 at night.  Are you having any problems obtaining your medications? (Whether it pharmacy issues or cost) as listed above.  If patient has any PAP medications ask if they are having any problems getting their PAP medication or refill?  Yes for venlysta.   Star Rating Drug:  Metformin 500mg  - last filled on 12/14/20 90DS at [facility]  Any gaps in medications fill history? No.  [place]  Clinical Pharmacist Assistant [phone]

## 2021-03-10 ENCOUNTER — Ambulatory Visit (INDEPENDENT_AMBULATORY_CARE_PROVIDER_SITE_OTHER): Payer: PPO | Admitting: Pharmacist

## 2021-03-10 DIAGNOSIS — M79642 Pain in left hand: Secondary | ICD-10-CM | POA: Diagnosis not present

## 2021-03-10 DIAGNOSIS — F339 Major depressive disorder, recurrent, unspecified: Secondary | ICD-10-CM | POA: Diagnosis not present

## 2021-03-10 DIAGNOSIS — E039 Hypothyroidism, unspecified: Secondary | ICD-10-CM | POA: Diagnosis not present

## 2021-03-10 NOTE — Progress Notes (Signed)
Chronic Care Management Pharmacy Note  03/20/2021 Name:  Jo [name] MRN:  [id number] DOB:  [birth year]  Summary: Patient is tapering off hormone therapy and was unsure of next steps  Recommendations/Changes made from today's visit: -Collaborated with OBGYN and instructed patient to take both estradiol and medroxyprogesterone every other day for 2 weeks and then stop  Plan: Follow up in 6 months   Subjective: Jo [name] is an 60 y.o. year old female who is a primary patient of Koberlein, Paris Lore, MD.  The CCM team was consulted for assistance with disease management and care coordination needs.    Engaged with patient by telephone for follow up visit in response to provider referral for pharmacy case management and/or care coordination services.   Consent to Services:  The patient was given information about Chronic Care Management services, agreed to services, and gave verbal consent prior to initiation of services.  Please see initial visit note for detailed documentation.   Patient Care Team: Wynn Banker, MD as PCP - General (Family Medicine) Runell Gess, MD as PCP - Cardiology (Cardiology) Rochele Raring, MD as Referring Physician (Anesthesiology) Iva Boop, MD as Consulting Physician (Gastroenterology) Magrinat, Valentino Hue, MD as Consulting Physician (Oncology) Casimer Lanius, MD as Consulting Physician (Rheumatology) Dorisann Frames, MD as Referring Physician (Endocrinology) Verner Chol, [facility] as Pharmacist (Pharmacist)  Recent office visits: 01/14/21 Kriste Basque, DO: Patient presented for video visit for nausea/vomiting and requesting a COVID antibody test. Recommended follow up with GI and OBGYN.  10/09/20 Theodora Blow, LPN: Patient presented for medicare annual wellness visit.  Recent consult visits: 02/25/21 Casimer Lanius (rheumatology): Patient presented for lupus follow up.  02/05/21 Scheryl Darter MD (Obstetrics and Gynecology) - seen for  intial consult for endometrial mass. Decreased estradiol to 1/2 tablet every morning.   02/05/21 Azalee Course PA (Cardiology) -  seen for palpitations and other issues. No medication changes. Follow up in 5 weeks.  01/28/21 Stan Head, MD (gastro): Patient presented for diarrhea/nausea/vomiting and weight loss follow up. Decreased metformin to 1000 mg with breakfast.  01/13/21 Casimer Lanius (rheumatology): Patient presented for lupus follow up.  01/10/21 Alcide Evener, NP (gastro): Patient presented for ED follow up for abdominal pain, weight loss and nausea.   12/13/20 Thyra Breed (pain medicine): Patient presented for chronic pain follow up. Unable to access notes.  12/04/20 Teryl Lucy (ortho): Patient presented for wrist contusion. Unable to access notes.  10/16/20 Thyra Breed (pain medicine): Patient presented for chronic pain follow up. Unable to access notes.  10/14/20 Casimer Lanius (rheumatology): Patient presented for lupus follow up.  05/15/20 Lillard Anes, NP (oncology): Patient presented for iron deficiency follow up.  Hospital visits: 01/07/21 Patient presented to the ED with abdominal pain.   Objective:  Lab Results  Component Value Date   CREATININE 1.03 (H) 03/11/2021   BUN 17 03/11/2021   GFR 68.20 11/15/2019   GFRNONAA >60 01/07/2021   GFRAA >60 05/15/2020   NA 141 03/11/2021   K 4.7 03/11/2021   CALCIUM 9.8 03/11/2021   CO2 21 03/11/2021   GLUCOSE 95 03/11/2021    Lab Results  Component Value Date/Time   HGBA1C 5.6 11/15/2019 09:49 AM   GFR 68.20 11/15/2019 09:49 AM   GFR 64.03 04/20/2019 11:25 AM    Last diabetic Eye exam: No results found for: HMDIABEYEEXA  Last diabetic Foot exam: No results found for: HMDIABFOOTEX   Lab Results  Component Value Date   CHOL 175 11/15/2019  HDL 75.00 11/15/2019   LDLCALC 74 11/15/2019   TRIG 130.0 11/15/2019   CHOLHDL 2 11/15/2019    Hepatic Function Latest Ref Rng & Units 01/07/2021 08/13/2020  05/15/2020  Total Protein 6.5 - 8.1 g/dL 7.0 7.2 6.9  Albumin 3.5 - 5.0 g/dL 4.3 4.2 4.2  AST 15 - 41 U/L $Remo'16 15 17  'ynURt$ ALT 0 - 44 U/L 9 10 <6  Alk Phosphatase 38 - 126 U/L 42 55 51  Total Bilirubin 0.3 - 1.2 mg/dL 0.6 0.2(L) 0.3    Lab Results  Component Value Date/Time   TSH 1.260 03/11/2021 03:36 PM   TSH 0.41 01/30/2021 11:59 AM    CBC Latest Ref Rng & Units 03/11/2021 01/07/2021 08/13/2020  WBC 3.4 - 10.8 x10E3/uL 4.9 5.1 5.0  Hemoglobin 11.1 - 15.9 g/dL 13.2 13.2 12.5  Hematocrit 34.0 - 46.6 % 39.8 40.1 38.4  Platelets 150 - 450 x10E3/uL 291 288 257    Lab Results  Component Value Date/Time   VD25OH 83.61 04/27/2018 03:38 PM    Clinical ASCVD: No  The 10-year ASCVD risk score Mikey Bussing DC Jr., et al., 2013) is: 2%   Values used to calculate the score:     Age: 56 years     Sex: Female     Is Non-Hispanic African American: No     Diabetic: Yes     Tobacco smoker: No     Systolic Blood Pressure: 409 mmHg     Is BP treated: No     HDL Cholesterol: 75 mg/dL     Total Cholesterol: 175 mg/dL    Depression screen [facility] 2/9 02/05/2021 10/09/2020 12/03/2019  Decreased Interest 0 0 2  Down, Depressed, Hopeless 0 0 1  PHQ - 2 Score 0 0 3  Altered sleeping 2 0 3  Tired, decreased energy 3 0 3  Change in appetite 3 0 3  Feeling bad or failure about yourself  1 0 0  Trouble concentrating 2 0 0  Moving slowly or fidgety/restless - 0 0  Suicidal thoughts 0 0 0  PHQ-9 Score 11 0 12  Difficult doing work/chores - Not difficult at all Somewhat difficult      Social History   Tobacco Use  Smoking Status Never  Smokeless Tobacco Never   BP Readings from Last 3 Encounters:  03/11/21 100/78  02/05/21 (!) 73/42  02/05/21 103/67   Pulse Readings from Last 3 Encounters:  03/11/21 71  02/05/21 (!) 47  02/05/21 (!) 57   Wt Readings from Last 3 Encounters:  03/11/21 140 lb 3.2 oz (63.6 kg)  02/05/21 139 lb 4.8 oz (63.2 kg)  02/05/21 138 lb 12.8 oz (63 kg)   BMI Readings from  Last 3 Encounters:  03/11/21 24.07 kg/m  02/05/21 23.91 kg/m  02/05/21 23.82 kg/m    Assessment/Interventions: Review of patient past medical history, allergies, medications, health status, including review of consultants reports, laboratory and other test data, was performed as part of comprehensive evaluation and provision of chronic care management services.   SDOH:  (Social Determinants of Health) assessments and interventions performed: No  SDOH Screenings   Alcohol Screen: Low Risk    Last Alcohol Screening Score (AUDIT): 0  Depression (PHQ2-9): Medium Risk   PHQ-2 Score: 11  Financial Resource Strain: Low Risk    Difficulty of Paying Living Expenses: Not hard at all  Food Insecurity: No Food Insecurity   Worried About Charity fundraiser in the Last Year: Never true   Ran  Out of Food in the Last Year: Never true  Housing: Low Risk    Last Housing Risk Score: 0  Physical Activity: Sufficiently Active   Days of Exercise per Week: 6 days   Minutes of Exercise per Session: 30 min  Social Connections: Socially Isolated   Frequency of Communication with Friends and Family: More than three times a week   Frequency of Social Gatherings with Friends and Family: Once a week   Attends Religious Services: Never   Marine scientist or Organizations: No   Attends Music therapist: Never   Marital Status: Divorced  Stress: No Stress Concern Present   Feeling of Stress : Not at all  Tobacco Use: Low Risk    Smoking Tobacco Use: Never   Smokeless Tobacco Use: Never  Transportation Needs: No Transportation Needs   Lack of Transportation (Medical): No   Lack of Transportation (Non-Medical): No    CCM Care Plan  Allergies  Allergen Reactions   Bee Pollen Anaphylaxis   Diazepam     sucidal depression   Prednisone Nausea And Vomiting    Per pt can not take oral steroids    Amoxicillin     Severe diarrhea At high dose    Doxycycline     Pt does not  remember reaction   Erythromycin     n & v   Morphine     n & v   Propoxyphene N-Acetaminophen     n & V   Sulfa Antibiotics     MIGRAINES   Tetracyclines & Related     N & v   Propoxyphene Other (See Comments)    Medications Reviewed Today     Reviewed by Almyra Deforest, [place] (Physician Assistant) on 03/13/21 at 2345  Med List Status: <None>   Medication Order Taking? Sig Documenting Provider Last Dose Status Informant  azelastine (ASTELIN) 0.1 % nasal spray [id number] Yes Place 1 spray into both nostrils 2 (two) times daily. Use in each nostril as directed Caren Macadam, MD Taking Active Self  Belimumab ([place]) 200 MG/ML SOSY [id number] Yes once a week. [provider] Taking Active   busPIRone (BUSPAR) 15 MG tablet [id number] Yes TAKE 1/2 TABLET BY MOUTH AT BREAKFAST AND AT BEDTIME Koberlein, Junell C, MD Taking Active   cholecalciferol (VITAMIN D) 1000 UNITS tablet [id number] Yes Take 2,000 Units by mouth daily. [provider] Taking Active Self  clindamycin (CLEOCIN T) 1 % lotion [id number] Yes Apply 1 application topically 2 (two) times daily as needed. [provider] Taking Active Self  Cranberry 200 MG CAPS [id number] Yes Take 200 mg by mouth 2 (two) times daily. [provider] Taking Active Self           Med Note Alvin Critchley   Wed Feb 05, 2021 10:45 AM)    cyanocobalamin ((VITAMIN B-12)) injection 1,000 mcg [id number]   Caren Macadam, MD  Active   cyanocobalamin (,VITAMIN B-12,) 1000 MCG/ML injection [id number] Yes INJECT ONE ML INTRAMUSCULARLY FOR ONE DOSE ALTERNATING EVERY TWO WEEKS, THEN MONHTLY Koberlein, Junell C, MD Taking Active   erythromycin with ethanol (EMGEL) 2 % gel [id number] Yes Apply topically 2 (two) times daily.  Patient taking differently: Apply topically 2 (two) times daily as needed.   Caren Macadam, MD Taking Active   estradiol (ESTRACE) 2 MG tablet [id number] Yes Take 0.5 tablets (1 mg total) by mouth  every morning. Woodroe Mode, MD Taking Active   fexofenadine-pseudoephedrine [facility]  24) 180-240 MG 24 hr tablet [id number] Yes Take 1 tablet by mouth at bedtime. [provider] Taking Active Self  fluticasone (CUTIVATE) 0.05 % cream [id number] Yes Apply 1 application topically 2 (two) times daily as needed (rash).  [provider] Taking Active Self  fluticasone (FLONASE) 50 MCG/ACT nasal spray [id number] Yes Place 2 sprays into both nostrils daily. Caren Macadam, MD Taking Active   folic acid (FOLVITE) 1 MG tablet [id number] Yes Take 1 tablet (1 mg total) by mouth daily.  Patient taking differently: Take 1 mg by mouth in the morning and at bedtime.   Gardenia Phlegm, NP Taking Active   HYDROmorphone (DILAUDID) 4 MG tablet [id number] Yes Take by mouth every 4 (four) hours as needed for severe pain. [provider] Taking Active   lamoTRIgine (LAMICTAL) 100 MG tablet [id number] Yes TAKE ONE TABLET BY MOUTH AT BREAKFAST AND AT BEDTIME Koberlein, Steele Berg, MD Taking Active   levothyroxine (SYNTHROID) 125 MCG tablet [id number] Yes TAKE ONE TABLET BY MOUTH EVERY MORNING Caren Macadam, MD Taking Active   LYSINE PO [id number] Yes Take 1 tablet by mouth daily. [provider] Taking Active   medroxyPROGESTERone (PROVERA) 2.5 MG tablet [id number] Yes Take 2.5 mg by mouth daily. [provider] Taking Active   metFORMIN (GLUCOPHAGE) 500 MG tablet [id number] Yes Take 1,000 mg by mouth daily with breakfast. [provider] Taking Active   methadone (DOLOPHINE) 5 MG tablet [id number] Yes Take 1 tablet by mouth 2 (two) times daily. [provider] Taking Active   metoCLOPramide (REGLAN) 10 MG tablet [id number] Yes Take 1 tablet (10 mg total) by mouth every 6 (six) hours as needed for nausea. Gatha Mayer, MD Taking Active   montelukast (SINGULAIR) 10 MG tablet [id number] Yes TAKE ONE TABLET BY MOUTH EVERYDAY AT BEDTIME Koberlein,  Junell C, MD Taking Active   Omeprazole-Sodium Bicarbonate (ZEGERID) 20-1100 MG CAPS capsule [id number] Yes Take 1 capsule by mouth daily. Caren Macadam, MD Taking Active Self  ondansetron (ZOFRAN) 4 MG tablet [id number] Yes Take 1 tablet (4 mg total) by mouth every 8 (eight) hours as needed for nausea. Caren Macadam, MD Taking Active   promethazine (PHENERGAN) 25 MG tablet [id number] Yes Take 1 tablet (25 mg total) by mouth every 8 (eight) hours as needed for up to 20 doses for nausea or vomiting. Take one tablet every 8- 12 hours as needed. Gatha Mayer, MD Taking Active   sertraline (ZOLOFT) 100 MG tablet [id number] Yes TAKE TWO TABLETS BY MOUTH EVERY MORNING Koberlein, Steele Berg, MD Taking Active   sucralfate (CARAFATE) 1 g tablet [id number]  Take 1 tablet (1 g total) by mouth 4 (four) times daily -  with meals and at bedtime for 14 days. Lennice Sites, DO  Expired 01/21/21 2359   Syringe/Needle, Disp, (SYRINGE 3CC/25GX1-1/2") 25G X 1-1/2" 3 ML MISC [id number] Yes Inject 1cc (101mcg) B12 q 2-4 weeks as directed Koberlein, Steele Berg, MD Taking Active   tiZANidine (ZANAFLEX) 4 MG capsule [id number] Yes Take 3 capsules (12 mg total) by mouth at bedtime as needed for muscle spasms. Caren Macadam, MD Taking Active   topiramate (TOPAMAX) 25 MG tablet [id number] Yes TAKE 1 TABLET 5 TIMES A DAY Koberlein, Junell C, MD Taking Active   triamcinolone cream (KENALOG) 0.1 % [id number] Yes daily as needed. [provider] Taking Active             Patient Active Problem List   Diagnosis Date  Noted   Depression, recurrent ([place]) 07/22/2020   Systemic lupus erythematosus ([place]) 04/29/2020   Iron deficiency 12/13/2019   Reflex sympathetic dystrophy of lower extremity 06/12/2019   Disorder of thyroid gland 06/12/2019   Falls 09/25/2015   Musculoskeletal neck pain 09/25/2015   Headache 09/25/2015   Memory changes 09/25/2015   Blurry vision 09/25/2015   Neck pain 09/25/2015    Polycystic ovaries 07/11/2013   Hypothyroidism 05/04/2013   Complex regional pain syndrome I, unspecified 03/06/2013   Extremity pain 41/58/3094   Helicobacter pylori infection 12/22/2012   History of colonic polyps 02/25/2009   OBESITY 01/14/2008   DEPRESSION/ANXIETY 01/14/2008   Asthma 01/14/2008   GERD 01/14/2008   Irritable colon 01/14/2008    Immunization History  Administered Date(s) Administered   Influenza, Quadrivalent, Recombinant, Inj, Pf 06/18/2020   Influenza, Seasonal, Injecte, Preservative Fre 06/21/2014, 06/26/2015, 07/29/2016   Influenza,inj,Quad PF,6+ Mos 07/15/2017, 06/15/2018, 06/13/2019   Influenza,inj,quad, With Preservative 07/11/2014, 07/19/2017   Influenza,trivalent, recombinat, inj, PF 06/14/2013   Influenza-Unspecified 06/18/2020   PFIZER(Purple Top)SARS-COV-2 Vaccination 12/28/2019, 01/25/2020, 08/06/2020, 02/03/2021   Pneumococcal Polysaccharide-23 10/09/2015   Tdap 10/23/2007   Zoster Recombinat (Shingrix) 11/01/2020    Conditions to be addressed/monitored:  GERD, Asthma, Hypothyroidism, Depression, Anxiety, Allergic Rhinitis, and PCOS, iron deficiency, vitamin B12 deficiency, chronic pain, lupus  Care Plan : CCM Pharmacy Care Plan  Updates made by Viona Gilmore, [place] since 03/20/2021 12:00 AM     Problem: Problem: GERD, Asthma, Hypothyroidism, Depression, Anxiety, Allergic Rhinitis, and PCOS, iron deficiency, vitamin B12 deficiency, chronic pain, lupus      Long-Range Goal: Patient-Specific Goal   Start Date: 03/10/2021  Expected End Date: 03/10/2022  This Visit's Progress: On track  Priority: High  Note:   Current Barriers:  Unable to independently monitor therapeutic efficacy  Pharmacist Clinical Goal(s):  Patient will achieve adherence to monitoring guidelines and medication adherence to achieve therapeutic efficacy through collaboration with PharmD and provider.   Interventions: 1:1 collaboration with Caren Macadam, MD  regarding development and update of comprehensive plan of care as evidenced by provider attestation and co-signature Inter-disciplinary care team collaboration (see longitudinal plan of care) Comprehensive medication review performed; medication list updated in electronic medical record  Asthma (Goal: control symptoms) -Controlled -Current treatment  montelukast (Singulair) 10mg , 1 tablet daily at bedtime -Medications previously tried: patient states previous nebulizers and inhalers have not worked for her (makes her shaky).  -Pulmonary function testing: n/a -Exacerbations requiring treatment in last 6 months: none -Patient denies consistent use of maintenance inhaler -Frequency of rescue inhaler use: does not have -Counseled on  benefits of having a rescue inhaler -Recommended to continue current medication  Hypothyroidism (Goal: TSH 0.35-4.5) -Controlled -Current treatment  levothyroxine 126mcg, 1 tablet once daily -Medications previously tried: none  -Counseled on consistent administration of levothyroxine (regards to food and other medications)   Iron deficiency (Goal: ferritin 46-336) -Controlled -Current treatment  Ferumoxytol (FERAHEME) infusion -Medications previously tried: n/a  -Recommended to continue current medication  PCOS (Goal: minimize symptoms) -Controlled -Current treatment  No medications -Medications previously tried: metformin (tapered off with GI symptoms)  -Recommended to continue as is  Folate deficiency (Goal: normal folate) -Controlled -Current treatment  folic acid 1 mg, 1 tablet once daily -Medications previously tried: none  -Recommended to continue current medication  Lupus (Goal: minimize symptoms) -Controlled -Current treatment  Benlysta  -Medications previously tried: methotrexate (n/v and fatigue) -Recommended to continue current medication  GI health (GERD, nausea, GI spasms (Goal: minimize symptoms)) -Uncontrolled -  Current  treatment  Zegerid 20-1100mg , 1 capsule once daily   sucralfate 1g, 1 tablet four times daily with meals and at bedtime Metoclopramide 10mg , 1 tablet four times daily (patient has been using as needed) ondansetron (Zofran) 4mg , 1 tablet every eight hours as needed for nausea -Medications previously tried: n/a  -Recommended to continue current medication   Depression/Anxiety (Goal: minimize symptoms) -Controlled -Current treatment: buspirone (Buspar) 15mg , 0.5 tablet (7.5mg ) twice daily sertraline 100mg , 2 tablets once daily lamotrigine 100mg ,1/2 tablet in AM and 1 tablet in PM -Medications previously tried/failed: n/a -PHQ9: 11 -GAD7: 4 -Educated on Benefits of medication for symptom control Benefits of cognitive-behavioral therapy with or without medication -Recommended to continue current medication  Pain (Goal: minimize pain) -Uncontrolled -Current treatment  hydromorphone 4mg , 1 tablet every 4 hours as needed for moderate pain or severe pain methadone 10mg , (Patient reports taking: 2 tablet in the morning, 2 tablets around noon, and 1 tab at bedtime). topiramate 25mg , 1 tablet in the morning and 3 tablets at bedtime tizanidine 4mg , 4 capsules at bedtime as needed for muscle spasms   Nucynta ER 50 mg 2 tablets daily -Medications previously tried: n/a  -Recommended to continue current medication  Menopausal symptoms (Goal: minimize symptoms) -Controlled -Current treatment  estradiol 2mg , 1/2 tablet once daily medroxyprogesterone 2.5mg , 1 tablet daily -Medications previously tried: none  -Collaborated with OBGYN and it was recommended to go to every other day for 2 weeks and then stop  Allergic rhinitis (Goal: minimize symptoms) -Controlled -Current treatment  azelastine (Astelin) 0.1% nasal spray, 1 spray into both nostrils twice daily Allegra, 180mg , 1 tablet at bedtime fluticasone (Flonase) 13mcg/act nasal spray, 2 sprays into each nostril every day -Medications  previously tried: none  -Recommended to continue current medication  Vitamin B12 deficiency (Goal: 211-911) -Controlled -Current treatment  cyanocobalamin 1000 mcg/ml, inject 1 ML into muscle every 2 weeks for 2 doses and then once every month -Medications previously tried: none  -Recommended to continue current medication Recommended repeat vitamin B12 level   Health Maintenance -Vaccine gaps: 2nd shingrix dose, tetanus -Current therapy:  Cranberry 200 mg 1 capsule twice daily Vitamin D 2000 units daily Lysine 1 tablet daily -Educated on Cost vs benefit of each product must be carefully weighed by individual consumer -Patient is satisfied with current therapy and denies issues -Recommended to continue current medication  Patient Goals/Self-Care Activities Patient will:  - take medications as prescribed  Follow Up Plan: Telephone follow up appointment with care management team member scheduled for:6 months        Medication Assistance: None required.  Patient affirms current coverage meets needs.  Compliance/Adherence/Medication fill history: Care Gaps: Second dose of shingrix, tetanus  Star-Rating Drugs: None  Patient's preferred pharmacy is:  [facility] - [place], [place] - [address] [place] [address] Phone: [phone] Fax: [phone]  Uses pill box? No - adherence packaging Pt endorses 100% compliance  We discussed: Benefits of medication synchronization, packaging and delivery as well as enhanced pharmacist oversight with [facility]. Patient decided to: Utilize [facility] pharmacy for medication synchronization, packaging and delivery  Care Plan and Follow Up Patient Decision:  Patient agrees to Care Plan and Follow-up.  Plan: Telephone follow up appointment with care management team member scheduled for:  6 months  [name], PharmD, [place] Pharmacist [place] at  [place] [phone]

## 2021-03-11 ENCOUNTER — Telehealth: Payer: Self-pay

## 2021-03-11 ENCOUNTER — Encounter: Payer: Self-pay | Admitting: Physician Assistant

## 2021-03-11 ENCOUNTER — Ambulatory Visit (INDEPENDENT_AMBULATORY_CARE_PROVIDER_SITE_OTHER): Payer: PPO | Admitting: Physician Assistant

## 2021-03-11 ENCOUNTER — Other Ambulatory Visit: Payer: Self-pay | Admitting: Family Medicine

## 2021-03-11 ENCOUNTER — Other Ambulatory Visit: Payer: Self-pay

## 2021-03-11 VITALS — BP 100/78 | HR 71 | Ht 64.0 in | Wt 140.2 lb

## 2021-03-11 DIAGNOSIS — I959 Hypotension, unspecified: Secondary | ICD-10-CM | POA: Diagnosis not present

## 2021-03-11 DIAGNOSIS — R5383 Other fatigue: Secondary | ICD-10-CM

## 2021-03-11 DIAGNOSIS — I471 Supraventricular tachycardia: Secondary | ICD-10-CM | POA: Diagnosis not present

## 2021-03-11 DIAGNOSIS — F341 Dysthymic disorder: Secondary | ICD-10-CM

## 2021-03-11 DIAGNOSIS — R0600 Dyspnea, unspecified: Secondary | ICD-10-CM | POA: Diagnosis not present

## 2021-03-11 NOTE — Progress Notes (Signed)
Cardiology Office Note:    Date:  03/13/2021   ID:  Jo [name], DOB 05-13-1961, MRN [id number]  PCP:  Caren Macadam, MD   [facility] Providers Cardiologist:  Quay Burow, MD     Referring MD: Caren Macadam, MD   Chief Complaint  Patient presents with   Follow-up    Seen for Dr. Gwenlyn Found    History of Present Illness:    Jo [name] is a 60 y.o. female with a hx of syncope, fibromyalgia, GERD, COPD, obstructive sleep apnea and lupus.  Patient was last seen virtually by Dr. Gwenlyn Found on 12/21/2018 for evaluation of syncope.  Prior to that, she never had a heart attack or stroke.  She did complain of atypical chest pain during the previous visit.  Coronary calcium scoring and heart monitor was recommended.  Heart monitor showed only occasional PAC and PVC.  Coronary calcium score was 0.  More recently, family member called in for her complaining of heart palpitation and chest discomfort along with shortness of breath.  She reportedly lost close to 25 pounds recently.  Recent TSH obtained on 01/30/2021 was 0.41, which was normal.  I last saw the patient on 02/05/2021 at which time she complained of body ache all over.  She also has been having significant GI issues including nausea, loss of appetite and diarrhea.  She mentions she has lost more than 40 pounds in the past several months.  She also complained of atypical chest pain that is worse with body rotation and palpation which I felt is likely musculoskeletal in etiology.  She has been noticing some palpitation, I recommended 2-week heart monitor.  Blood pressure that day was 103/67.  However when she was seen by her OB/GYN physician that same afternoon, her blood pressure was 73/42, pulse was 47.  However there was no mention of the low blood pressure in the note.  Heart monitor demonstrated minimal heart rate 44 bpm, maximal heart rate 207, average heart rate of 66 bpm.  Predominantly sinus rhythm with 863 supraventricular  tachycardia runs.  Fastest interval 9 beats with maximal heart rate of 207 bpm, longest lasting 18 beats with average rate of 134 bpm.  The patient presents today for follow-up.  We reviewed the recent heart monitor report.  She continues to have fatigue, shortness of breath.  She mostly attributed to her lupus.  I would recommend additional lab work including CBC, basic metabolic panel and a TSH to rule out secondary causes for her symptoms.  She has periodic hypotension and had to increase salt intake.  Given the significant PVC burden, I recommended echocardiogram to assess the ejection fraction.  Even though she has frequent SVT, those are relatively transient and her blood pressure does not allow me to increase her medication.  I recommended continued observation.   Past Medical History:  Diagnosis Date   Allergy    Anemia    Anxiety    Anxiety and depression    Arrhythmia 2021   Arthritis    Asthma    Cervical cancer [facility])    age 28    Chronic headaches    Colon polyp    COPD (chronic obstructive pulmonary disease) (HCC)    no o2   Depression    Endometriosis    Fibromyalgia    Fundic gland polyps of stomach, benign    Gastritis    GERD (gastroesophageal reflux disease)    H. pylori infection 2013   H/O gastric ulcer  1985   History of anorexia nervosa    History of bulimia    History of pneumonia    Hypothyroidism    Irritable bowel syndrome (IBS)    Obesity    Personal history of other mental and behavioral disorders 01/14/2008   Qualifier: Diagnosis of  By: Carlean Purl MD, [name]   Overview:  Overview:  Annotation: BULIMIA Qualifier: Diagnosis of  By: Bertram Gala    REFLEX SYMPATHETIC DYSTROPHY 02/26/2009   Qualifier: Diagnosis of  By: Carlean Purl MD, Tonna Boehringer E    Rheumatoid arteritis ([place])    RSD (reflex sympathetic dystrophy)    Sleep apnea    Systemic lupus ([place]) 2021   Ulcer    gastric, duodenal ulcer    Past Surgical History:  Procedure  Laterality Date   CERVICAL SPINE SURGERY  1990   CERVIX SURGERY     CHOLECYSTECTOMY     COLONOSCOPY     KNEE ARTHROSCOPY  04/21/2012   right   LAPAROSCOPIC NISSEN FUNDOPLICATION  39/11/90   Dr. Johnathan Hausen   laparoscopy     for endometriosis   lumbar spondylosis injected  2013   Dr. Mina Marble   NASAL SINUS SURGERY     THORACOSCOPY W/ THORACIC SYMPATHECTOMY     for RSD   TONSILLECTOMY     UPPER GASTROINTESTINAL ENDOSCOPY      Current Medications: Current Meds  Medication Sig   azelastine (ASTELIN) 0.1 % nasal spray Place 1 spray into both nostrils 2 (two) times daily. Use in each nostril as directed   Belimumab (BENLYSTA) 200 MG/ML SOSY once a week.   busPIRone (BUSPAR) 15 MG tablet TAKE 1/2 TABLET BY MOUTH AT BREAKFAST AND AT BEDTIME   cholecalciferol (VITAMIN D) 1000 UNITS tablet Take 2,000 Units by mouth daily.   clindamycin (CLEOCIN T) 1 % lotion Apply 1 application topically 2 (two) times daily as needed.   Cranberry 200 MG CAPS Take 200 mg by mouth 2 (two) times daily.   cyanocobalamin (,VITAMIN B-12,) 1000 MCG/ML injection INJECT ONE ML INTRAMUSCULARLY FOR ONE DOSE ALTERNATING EVERY TWO WEEKS, THEN MONHTLY   erythromycin with ethanol (EMGEL) 2 % gel Apply topically 2 (two) times daily. (Patient taking differently: Apply topically 2 (two) times daily as needed.)   estradiol (ESTRACE) 2 MG tablet Take 0.5 tablets (1 mg total) by mouth every morning.   fexofenadine-pseudoephedrine (ALLEGRA-D 24) 180-240 MG 24 hr tablet Take 1 tablet by mouth at bedtime.   fluticasone (CUTIVATE) 0.05 % cream Apply 1 application topically 2 (two) times daily as needed (rash).    fluticasone (FLONASE) 50 MCG/ACT nasal spray Place 2 sprays into both nostrils daily.   folic acid (FOLVITE) 1 MG tablet Take 1 tablet (1 mg total) by mouth daily. (Patient taking differently: Take 1 mg by mouth in the morning and at bedtime.)   HYDROmorphone (DILAUDID) 4 MG tablet Take by mouth every 4 (four) hours as  needed for severe pain.   lamoTRIgine (LAMICTAL) 100 MG tablet TAKE ONE TABLET BY MOUTH AT BREAKFAST AND AT BEDTIME   levothyroxine (SYNTHROID) 125 MCG tablet TAKE ONE TABLET BY MOUTH EVERY MORNING   LYSINE PO Take 1 tablet by mouth daily.   medroxyPROGESTERone (PROVERA) 2.5 MG tablet Take 2.5 mg by mouth daily.   metFORMIN (GLUCOPHAGE) 500 MG tablet Take 1,000 mg by mouth daily with breakfast.   methadone (DOLOPHINE) 5 MG tablet Take 1 tablet by mouth 2 (two) times daily.   metoCLOPramide (REGLAN) 10 MG tablet  Take 1 tablet (10 mg total) by mouth every 6 (six) hours as needed for nausea.   montelukast (SINGULAIR) 10 MG tablet TAKE ONE TABLET BY MOUTH EVERYDAY AT BEDTIME   Omeprazole-Sodium Bicarbonate (ZEGERID) 20-1100 MG CAPS capsule Take 1 capsule by mouth daily.   ondansetron (ZOFRAN) 4 MG tablet Take 1 tablet (4 mg total) by mouth every 8 (eight) hours as needed for nausea.   promethazine (PHENERGAN) 25 MG tablet Take 1 tablet (25 mg total) by mouth every 8 (eight) hours as needed for up to 20 doses for nausea or vomiting. Take one tablet every 8- 12 hours as needed.   sertraline (ZOLOFT) 100 MG tablet TAKE TWO TABLETS BY MOUTH EVERY MORNING   Syringe/Needle, Disp, (SYRINGE 3CC/25GX1-1/2") 25G X 1-1/2" 3 ML MISC Inject 1cc (1034mcg) B12 q 2-4 weeks as directed   tiZANidine (ZANAFLEX) 4 MG capsule Take 3 capsules (12 mg total) by mouth at bedtime as needed for muscle spasms.   topiramate (TOPAMAX) 25 MG tablet TAKE 1 TABLET 5 TIMES A DAY   triamcinolone cream (KENALOG) 0.1 % daily as needed.   [DISCONTINUED] alosetron (LOTRONEX) 0.5 MG tablet Take 1 tablet (0.5 mg total) by mouth 2 (two) times daily. Stop if severe constipation or abdominal pain   Current Facility-Administered Medications for the 03/11/21 encounter (Office Visit) with Almyra Deforest, PA  Medication   cyanocobalamin ((VITAMIN B-12)) injection 1,000 mcg     Allergies:   Bee pollen, Diazepam, Prednisone, Amoxicillin, Doxycycline,  Erythromycin, Morphine, Propoxyphene n-acetaminophen, Sulfa antibiotics, Tetracyclines & related, and Propoxyphene   Social History   Socioeconomic History   Marital status: Divorced    Spouse name: Not on file   Number of children: 0   Years of education: 16   Highest education level: Not on file  Occupational History   Occupation: flight attendant-disabled    Employer: UNEMPLOYED  Tobacco Use   Smoking status: Never   Smokeless tobacco: Never  Vaping Use   Vaping Use: Never used  Substance and Sexual Activity   Alcohol use: No   Drug use: No   Sexual activity: Not Currently  Other Topics Concern   Not on file  Social History Narrative   Lives at home alone.   Caffeine use: Drinks coffee- 1 cup in morning   Ginger ale   Social Determinants of Health   Financial Resource Strain: Low Risk    Difficulty of Paying Living Expenses: Not hard at all  Food Insecurity: No Food Insecurity   Worried About Charity fundraiser in the Last Year: Never true   Arboriculturist in the Last Year: Never true  Transportation Needs: No Transportation Needs   Lack of Transportation (Medical): No   Lack of Transportation (Non-Medical): No  Physical Activity: Sufficiently Active   Days of Exercise per Week: 6 days   Minutes of Exercise per Session: 30 min  Stress: No Stress Concern Present   Feeling of Stress : Not at all  Social Connections: Socially Isolated   Frequency of Communication with Friends and Family: More than three times a week   Frequency of Social Gatherings with Friends and Family: Once a week   Attends Religious Services: Never   Marine scientist or Organizations: No   Attends Music therapist: Never   Marital Status: Divorced     Family History: The patient's family history includes Breast cancer in her maternal aunt; Colon polyps in her brother, mother, and sister; Irritable bowel syndrome  in her mother; Kidney Stones in her brother; Lung cancer in  her maternal grandfather; Other in her mother; Raynaud syndrome in her mother; Stroke in her mother. There is no history of Colon cancer, Esophageal cancer, Rectal cancer, Stomach cancer, Migraines, or Seizures.  ROS:   Please see the history of present illness.     All other systems reviewed and are negative.  EKGs/Labs/Other Studies Reviewed:    The following studies were reviewed today:  N/A  EKG:  EKG is ordered today.  The ekg ordered today demonstrates normal sinus rhythm, no significant ST-T wave changes, occasional PAC.  Recent Labs: 01/07/2021: ALT 9 03/11/2021: BUN 17; Creatinine, Ser 1.03; Hemoglobin 13.2; Platelets 291; Potassium 4.7; Sodium 141; TSH 1.260  Recent Lipid Panel    Component Value Date/Time   CHOL 175 11/15/2019 0949   TRIG 130.0 11/15/2019 0949   HDL 75.00 11/15/2019 0949   CHOLHDL 2 11/15/2019 0949   VLDL 26.0 11/15/2019 0949   LDLCALC 74 11/15/2019 0949     Risk Assessment/Calculations:           Physical Exam:    VS:  BP 100/78   Pulse 71   Ht 5\' 4"  (1.626 m)   Wt 140 lb 3.2 oz (63.6 kg)   SpO2 97%   BMI 24.07 kg/m     Wt Readings from Last 3 Encounters:  03/11/21 140 lb 3.2 oz (63.6 kg)  02/05/21 139 lb 4.8 oz (63.2 kg)  02/05/21 138 lb 12.8 oz (63 kg)     GEN:  Well nourished, well developed in no acute distress HEENT: Normal NECK: No JVD; No carotid bruits LYMPHATICS: No lymphadenopathy CARDIAC: RRR, no murmurs, rubs, gallops RESPIRATORY:  Clear to auscultation without rales, wheezing or rhonchi  ABDOMEN: Soft, non-tender, non-distended MUSCULOSKELETAL:  No edema; No deformity  SKIN: Warm and dry NEUROLOGIC:  Alert and oriented x 3 PSYCHIATRIC:  Normal affect   ASSESSMENT:    1. Other fatigue   2. Dyspnea, unspecified type   3. Hypotension, unspecified hypotension type    PLAN:    In order of problems listed above:  Fatigue: Unclear cause.  I recommended initial lab work with CBC, TSH and basic metabolic panel.  I  also recommended echocardiogram as well given the recent hypotension and frequent PVCs and SVT seen on heart monitor to rule out structural heart abnormality.  Dyspnea: Recommend to proceed with echocardiogram.  Periodic hypotension: Likely contributing to her fatigue.  SVT: Recent heart monitor showed 863 episode of very transient SVT.  Unfortunately due to periodic hypotension, I cannot further uptitrate rate controlling medication.  Given the transient nature of SVT, we will continue observation for now.        Medication Adjustments/Labs and Tests Ordered: Current medicines are reviewed at length with the patient today.  Concerns regarding medicines are outlined above.  Orders Placed This Encounter  Procedures   Basic metabolic panel   CBC   TSH   EKG 12-Lead   ECHOCARDIOGRAM COMPLETE   No orders of the defined types were placed in this encounter.   Patient Instructions  Medication Instructions:  No Changes *If you need a refill on your cardiac medications before your next appointment, please call your pharmacy*   Lab Work: [facility] If you have labs (blood work) drawn today and your tests are completely normal, you will receive your results only by: [place] (if you have MyChart) OR A paper copy in the mail If you have any lab test  that is abnormal or we need to change your treatment, we will call you to review the results.   Testing/Procedures: [place] has requested that you have an echocardiogram. Echocardiography is a painless test that uses sound waves to create images of your heart. It provides your doctor with information about the size and shape of your heart and how well your heart's chambers and valves are working. This procedure takes approximately one hour. There are no restrictions for this procedure.    Follow-Up: At [facility], you and your health needs are our priority.  As part of our continuing  mission to provide you with exceptional heart care, we have created designated Provider Care Teams.  These Care Teams include your primary Cardiologist (physician) and Advanced Practice Providers (APPs -  Physician Assistants and Nurse Practitioners) who all work together to provide you with the care you need, when you need it.  We recommend signing up for the patient portal called "MyChart".  Sign up information is provided on this After Visit Summary.  MyChart is used to connect with patients for Virtual Visits (Telemedicine).  Patients are able to view lab/test results, encounter notes, upcoming appointments, etc.  Non-urgent messages can be sent to your provider as well.   To learn more about what you can do with MyChart, go to [email or web address].    Your next appointment:   3 month(s)  The format for your next appointment:   In Person  Provider:   Quay Burow, MD       Signed, Almyra Deforest, [place]  03/13/2021 11:47 PM    [place]

## 2021-03-11 NOTE — Telephone Encounter (Signed)
Pt called front office to inquire about [place] appt. Per chart review, pt was scheduled at provider appt on 02/05/21. Pt no showed appt for [place] on 02/25/21. 03/18/21 at 1130; check-in at 1115 with full bladder.   Called pt with [place] appt. Pt cannot keep this appt due to conflict. Pt given phone number for centralized scheduling to reschedule.

## 2021-03-11 NOTE — Patient Instructions (Signed)
Medication Instructions:  No Changes *If you need a refill on your cardiac medications before your next appointment, please call your pharmacy*   Lab Work: [facility] If you have labs (blood work) drawn today and your tests are completely normal, you will receive your results only by: [place] (if you have MyChart) OR A paper copy in the mail If you have any lab test that is abnormal or we need to change your treatment, we will call you to review the results.   Testing/Procedures: [place] has requested that you have an echocardiogram. Echocardiography is a painless test that uses sound waves to create images of your heart. It provides your doctor with information about the size and shape of your heart and how well your heart's chambers and valves are working. This procedure takes approximately one hour. There are no restrictions for this procedure.    Follow-Up: At [facility], you and your health needs are our priority.  As part of our continuing mission to provide you with exceptional heart care, we have created designated Provider Care Teams.  These Care Teams include your primary Cardiologist (physician) and Advanced Practice Providers (APPs -  Physician Assistants and Nurse Practitioners) who all work together to provide you with the care you need, when you need it.  We recommend signing up for the patient portal called "MyChart".  Sign up information is provided on this After Visit Summary.  MyChart is used to connect with patients for Virtual Visits (Telemedicine).  Patients are able to view lab/test results, encounter notes, upcoming appointments, etc.  Non-urgent messages can be sent to your provider as well.   To learn more about what you can do with MyChart, go to [email or web address].    Your next appointment:   3 month(s)  The format for your next appointment:   In Person  Provider:   Quay Burow, MD

## 2021-03-12 ENCOUNTER — Encounter: Payer: Self-pay | Admitting: *Deleted

## 2021-03-12 LAB — BASIC METABOLIC PANEL
BUN/Creatinine Ratio: 17 (ref 9–23)
BUN: 17 mg/dL (ref 6–24)
CO2: 21 mmol/L (ref 20–29)
Calcium: 9.8 mg/dL (ref 8.7–10.2)
Chloride: 104 mmol/L (ref 96–106)
Creatinine, Ser: 1.03 mg/dL — ABNORMAL HIGH (ref 0.57–1.00)
Glucose: 95 mg/dL (ref 65–99)
Potassium: 4.7 mmol/L (ref 3.5–5.2)
Sodium: 141 mmol/L (ref 134–144)
eGFR: 63 mL/min/{1.73_m2} (ref >59)

## 2021-03-12 LAB — CBC
Hematocrit: 39.8 % (ref 34.0–46.6)
Hemoglobin: 13.2 g/dL (ref 11.1–15.9)
MCH: 29.9 pg (ref 26.6–33.0)
MCHC: 33.2 g/dL (ref 31.5–35.7)
MCV: 90 fL (ref 79–97)
Platelets: 291 10*3/uL (ref 150–450)
RBC: 4.42 x10E6/uL (ref 3.77–5.28)
RDW: 12.5 % (ref 11.7–15.4)
WBC: 4.9 10*3/uL (ref 3.4–10.8)

## 2021-03-12 LAB — TSH: TSH: 1.26 u[IU]/mL (ref 0.450–4.500)

## 2021-03-12 NOTE — Progress Notes (Signed)
Stable renal function, thyroid level. No sign of anemia. Normal electrolyte.

## 2021-03-13 ENCOUNTER — Encounter: Payer: Self-pay | Admitting: Physician Assistant

## 2021-03-18 ENCOUNTER — Ambulatory Visit (HOSPITAL_COMMUNITY): Payer: PPO

## 2021-03-18 ENCOUNTER — Ambulatory Visit (HOSPITAL_COMMUNITY)
Admission: RE | Admit: 2021-03-18 | Discharge: 2021-03-18 | Disposition: A | Discharge status: Home / Self Care | Payer: PPO | Source: Ambulatory Visit | Attending: Obstetrics & Gynecology | Admitting: Obstetrics & Gynecology

## 2021-03-18 ENCOUNTER — Other Ambulatory Visit: Payer: Self-pay

## 2021-03-18 DIAGNOSIS — M542 Cervicalgia: Secondary | ICD-10-CM | POA: Diagnosis not present

## 2021-03-18 DIAGNOSIS — G894 Chronic pain syndrome: Secondary | ICD-10-CM | POA: Diagnosis not present

## 2021-03-18 DIAGNOSIS — G90512 Complex regional pain syndrome I of left upper limb: Secondary | ICD-10-CM | POA: Diagnosis not present

## 2021-03-18 DIAGNOSIS — N9489 Other specified conditions associated with female genital organs and menstrual cycle: Secondary | ICD-10-CM | POA: Insufficient documentation

## 2021-03-18 DIAGNOSIS — N888 Other specified noninflammatory disorders of cervix uteri: Secondary | ICD-10-CM | POA: Diagnosis not present

## 2021-03-18 DIAGNOSIS — N841 Polyp of cervix uteri: Secondary | ICD-10-CM | POA: Diagnosis not present

## 2021-03-18 DIAGNOSIS — Z79891 Long term (current) use of opiate analgesic: Secondary | ICD-10-CM | POA: Diagnosis not present

## 2021-03-27 ENCOUNTER — Telehealth: Payer: Self-pay

## 2021-03-27 NOTE — Telephone Encounter (Addendum)
-----   Message from Woodroe Mode, MD sent at 03/26/2021  1:50 PM EDT ----- Please have the patient return for routine gyn f/u and management of her HRT   Called pt; pt would like to return for exam with PAP smear. [place] office notified to call pt to schedule.

## 2021-04-02 ENCOUNTER — Ambulatory Visit (HOSPITAL_COMMUNITY): Payer: PPO | Attending: Cardiology

## 2021-04-02 ENCOUNTER — Other Ambulatory Visit: Payer: Self-pay

## 2021-04-02 DIAGNOSIS — R5383 Other fatigue: Secondary | ICD-10-CM

## 2021-04-02 DIAGNOSIS — R0600 Dyspnea, unspecified: Secondary | ICD-10-CM | POA: Insufficient documentation

## 2021-04-02 LAB — ECHOCARDIOGRAM COMPLETE
Area-P 1/2: 2.56 cm2
S' Lateral: 2.8 cm

## 2021-04-14 ENCOUNTER — Ambulatory Visit: Payer: PPO

## 2021-04-16 ENCOUNTER — Telehealth: Payer: Self-pay | Admitting: Adult Health

## 2021-04-16 NOTE — Telephone Encounter (Signed)
Scheduled appointment per provider. Patient is aware. 

## 2021-04-28 ENCOUNTER — Ambulatory Visit: Payer: PPO | Admitting: Adult Health

## 2021-04-28 ENCOUNTER — Encounter: Payer: Self-pay | Admitting: Adult Health

## 2021-04-28 ENCOUNTER — Ambulatory Visit: Payer: PPO

## 2021-04-28 ENCOUNTER — Inpatient Hospital Stay: Payer: PPO | Attending: Adult Health

## 2021-04-28 ENCOUNTER — Other Ambulatory Visit: Payer: Self-pay

## 2021-04-28 ENCOUNTER — Inpatient Hospital Stay (HOSPITAL_BASED_OUTPATIENT_CLINIC_OR_DEPARTMENT_OTHER): Payer: PPO | Admitting: Adult Health

## 2021-04-28 VITALS — BP 113/73 | HR 53 | Temp 97.9°F | Resp 18 | Ht 64.0 in | Wt 147.2 lb

## 2021-04-28 DIAGNOSIS — G894 Chronic pain syndrome: Secondary | ICD-10-CM | POA: Diagnosis not present

## 2021-04-28 DIAGNOSIS — E538 Deficiency of other specified B group vitamins: Secondary | ICD-10-CM | POA: Insufficient documentation

## 2021-04-28 DIAGNOSIS — E611 Iron deficiency: Secondary | ICD-10-CM

## 2021-04-28 DIAGNOSIS — M542 Cervicalgia: Secondary | ICD-10-CM | POA: Diagnosis not present

## 2021-04-28 DIAGNOSIS — Z79891 Long term (current) use of opiate analgesic: Secondary | ICD-10-CM | POA: Diagnosis not present

## 2021-04-28 DIAGNOSIS — G90512 Complex regional pain syndrome I of left upper limb: Secondary | ICD-10-CM | POA: Diagnosis not present

## 2021-04-28 LAB — CMP (CANCER CENTER ONLY)
ALT: 8 U/L (ref 0–44)
AST: 15 U/L (ref 15–41)
Albumin: 4.3 g/dL (ref 3.5–5.0)
Alkaline Phosphatase: 71 U/L (ref 38–126)
Anion gap: 8 (ref 5–15)
BUN: 14 mg/dL (ref 6–20)
CO2: 24 mmol/L (ref 22–32)
Calcium: 9.9 mg/dL (ref 8.9–10.3)
Chloride: 110 mmol/L (ref 98–111)
Creatinine: 1.08 mg/dL — ABNORMAL HIGH (ref 0.44–1.00)
GFR, Estimated: 60 mL/min — ABNORMAL LOW (ref >60)
Glucose, Bld: 93 mg/dL (ref 70–99)
Potassium: 4.3 mmol/L (ref 3.5–5.1)
Sodium: 142 mmol/L (ref 135–145)
Total Bilirubin: <0.2 mg/dL — ABNORMAL LOW (ref 0.3–1.2)
Total Protein: 7.1 g/dL (ref 6.5–8.1)

## 2021-04-28 LAB — IRON AND TIBC
Iron: 72 ug/dL (ref 41–142)
Saturation Ratios: 22 % (ref 21–57)
TIBC: 334 ug/dL (ref 236–444)
UIBC: 261 ug/dL (ref 120–384)

## 2021-04-28 LAB — FERRITIN: Ferritin: 105 ng/mL (ref 11–307)

## 2021-04-28 LAB — CBC WITH DIFFERENTIAL (CANCER CENTER ONLY)
Abs Immature Granulocytes: 0.01 10*3/uL (ref 0.00–0.07)
Basophils Absolute: 0 10*3/uL (ref 0.0–0.1)
Basophils Relative: 1 %
Eosinophils Absolute: 0.1 10*3/uL (ref 0.0–0.5)
Eosinophils Relative: 3 %
HCT: 37.7 % (ref 36.0–46.0)
Hemoglobin: 12.7 g/dL (ref 12.0–15.0)
Immature Granulocytes: 0 %
Lymphocytes Relative: 30 %
Lymphs Abs: 1.3 10*3/uL (ref 0.7–4.0)
MCH: 30.5 pg (ref 26.0–34.0)
MCHC: 33.7 g/dL (ref 30.0–36.0)
MCV: 90.6 fL (ref 80.0–100.0)
Monocytes Absolute: 0.4 10*3/uL (ref 0.1–1.0)
Monocytes Relative: 8 %
Neutro Abs: 2.5 10*3/uL (ref 1.7–7.7)
Neutrophils Relative %: 58 %
Platelet Count: 241 10*3/uL (ref 150–400)
RBC: 4.16 MIL/uL (ref 3.87–5.11)
RDW: 13 % (ref 11.5–15.5)
WBC Count: 4.3 10*3/uL (ref 4.0–10.5)
nRBC: 0 % (ref 0.0–0.2)

## 2021-04-28 LAB — FOLATE: Folate: >100 ng/mL (ref >5.9)

## 2021-04-28 MED ORDER — SUCRALFATE 1 G PO TABS: 1.0000 g | ORAL_TABLET | Freq: Three times a day (TID) | ORAL | 0 refills | Status: DC | PRN

## 2021-04-28 NOTE — Progress Notes (Signed)
[place]  [phone] [phone]     ID: Jo [name] DOB: [birth year]  MR#: [id number]  HGD#:[id number]  Patient Care Team: Caren Macadam, MD as PCP - General (Family Medicine) Lorretta Harp, MD as PCP - Cardiology (Cardiology) Leone Payor, MD as Referring Physician (Anesthesiology) Gatha Mayer, MD as Consulting Physician (Gastroenterology) Magrinat, Virgie Dad, MD as Consulting Physician (Oncology) [name], MD as Consulting Physician (Rheumatology) Jacelyn Pi, MD as Referring Physician (Endocrinology) Viona Gilmore, [facility] as Pharmacist (Pharmacist) Scot Dock, NP OTHER MD:  CHIEF COMPLAINT: Iron deficiency  CURRENT TREATMENT:  IV iron when needed   HISTORY OF CURRENT ILLNESS:  Jo [name] is here today for evaluation and management for iron deficiency at the request of Dr. Ethlyn Gallery.  Jo [name] has had chronic fatigue, however notes that her fatigue and weakness have worsened over the past year.  Her iron studies have been as follows:  Results for LAKETA, SANDOZ (MRN [id number]) as of 12/13/2019 12:01  Ref. Range 02/21/2007 15:32 08/01/2012 16:05 09/27/2018 13:03 11/15/2019 09:49  Iron Latest Ref Range: 42 - 145 ug/dL 80   61  Saturation Ratios Latest Ref Range: 20.0 - 50.0 % 19.0 (L)   13.2 (L)  Ferritin Latest Ref Range: 10.0 - 291.0 ng/mL 15.0 5.6 (L)  8.1 (L)  Transferrin Latest Ref Range: 212.0 - 360.0 mg/dL 301.1   329.0  Folate Latest Ref Range: >5.9 ng/mL 19.0  17.6 6.0   Jo [name] cannot tolerate oral iron, and she was referred here for evaluation of iron deficiency and management with IV iron.  Jo [name]'s CBC has maintained a stable and normal hemoglobin without evidence of microcytosis:  Results for ABEERA, FLANNERY (MRN [id number]) as of 12/13/2019 12:01  Ref. Range 11/15/2019 09:49 12/11/2019 15:59  WBC Latest Ref Range: 4.0 - 10.5 K/uL 4.9 6.5  RBC Latest Ref Range: 3.87 - 5.11 MIL/uL 4.16 4.39  Hemoglobin Latest Ref Range:  12.0 - 15.0 g/dL 12.2 12.9  HCT Latest Ref Range: 36.0 - 46.0 % 37.4 40.4  MCV Latest Ref Range: 80.0 - 100.0 fL 89.8 92.0  MCH Latest Ref Range: 26.0 - 34.0 pg  29.4  MCHC Latest Ref Range: 30.0 - 36.0 g/dL 32.6 31.9  RDW Latest Ref Range: 11.5 - 15.5 % 13.4 13.2  Platelets Latest Ref Range: 150 - 400 K/uL 248.0 273   Jo [name] is also in the middle of undergoing evaluation by rheumatology for lupus.  She has a worsening facial rash and also positive ANA.    The patient's subsequent history is as detailed below.  INTERVAL HISTORY: Jo [name] is here today for evaluation and management of her iron deficiency and inability to tolerate oral iron.  She has folate deficiency and is taking folic acid BID.  Her folate and iron levels are pending.  She notes that this past year she has had tremendous difficulty with nausea, vomiting, and diarrhea, and it recently resolved about a month ago.  Her hemoglobin has remained stable.  She denies any blood in her stool or black tarry stool.  She underwent a colonoscopy and upper endoscopy this year which were unrevealing.  She denies any vaginal bleeding and notes that she follows up with gynecology regularly.    REVIEW OF SYSTEMS: Review of Systems  Constitutional:  Negative for appetite change, chills, fatigue, fever and unexpected weight change.  HENT:   Negative for hearing loss, lump/mass and trouble swallowing.   Eyes:  Negative for eye problems and icterus.  Respiratory:  Negative for chest tightness, cough and shortness of breath.   Cardiovascular:  Negative for chest pain, leg swelling and palpitations.  Gastrointestinal:  Negative for abdominal distention, abdominal pain, blood in stool, constipation, diarrhea, nausea and vomiting.  Endocrine: Negative for hot flashes.  Genitourinary:  Negative for difficulty urinating, hematuria and vaginal bleeding.   Musculoskeletal:  Negative for arthralgias.  Skin:  Negative for itching and rash.  Neurological:   Negative for dizziness, extremity weakness, headaches and numbness.  Hematological:  Negative for adenopathy. Does not bruise/bleed easily.  Psychiatric/Behavioral:  Negative for depression. The patient is not nervous/anxious.     PAST MEDICAL HISTORY: Past Medical History:  Diagnosis Date   Allergy    Anemia    Anxiety    Anxiety and depression    Arrhythmia 2021   Arthritis    Asthma    Cervical cancer ([place])    age 60    Chronic headaches    Colon polyp    COPD (chronic obstructive pulmonary disease) (HCC)    no o2   Depression    Endometriosis    Fibromyalgia    Fundic gland polyps of stomach, benign    Gastritis    GERD (gastroesophageal reflux disease)    H. pylori infection 2013   H/O gastric ulcer 1985   History of anorexia nervosa    History of bulimia    History of pneumonia    Hypothyroidism    Irritable bowel syndrome (IBS)    Obesity    Personal history of other mental and behavioral disorders 01/14/2008   Qualifier: Diagnosis of  By: Carlean Purl MD, [name]   Overview:  Overview:  Annotation: BULIMIA Qualifier: Diagnosis of  By: Bertram Gala    REFLEX SYMPATHETIC DYSTROPHY 02/26/2009   Qualifier: Diagnosis of  By: Carlean Purl MD, Tonna Boehringer E    Rheumatoid arteritis ([place])    RSD (reflex sympathetic dystrophy)    Sleep apnea    Systemic lupus ([facility]) 2021   Ulcer    gastric, duodenal ulcer    PAST SURGICAL HISTORY: Past Surgical History:  Procedure Laterality Date   CERVICAL SPINE SURGERY  1990   CERVIX SURGERY     CHOLECYSTECTOMY     COLONOSCOPY     KNEE ARTHROSCOPY  04/21/2012   right   LAPAROSCOPIC NISSEN FUNDOPLICATION  54/65/6812   Dr. Johnathan Hausen   laparoscopy     for endometriosis   lumbar spondylosis injected  2013   Dr. Mina Marble   NASAL SINUS SURGERY     THORACOSCOPY W/ THORACIC SYMPATHECTOMY     for RSD   TONSILLECTOMY     UPPER GASTROINTESTINAL ENDOSCOPY      FAMILY HISTORY Family History  Problem Relation Age of Onset    Colon polyps Mother        part of intestines removed   Raynaud syndrome Mother    Irritable bowel syndrome Mother    Other Mother        cold agluten   Stroke Mother    Colon polyps Brother    Kidney Stones Brother    Breast cancer Maternal Aunt    Colon polyps Sister    Lung cancer Maternal Grandfather    Colon cancer Neg Hx    Esophageal cancer Neg Hx    Rectal cancer Neg Hx    Stomach cancer Neg Hx    Migraines Neg Hx    Seizures Neg Hx     GYNECOLOGIC HISTORY:  No LMP recorded. Patient is postmenopausal. Menarche: 60 years old [place] P 0 LMP 3/23 Contraceptivenone HRT no  Hysterectomy? no Salpingo-oophorectomy?no    SOCIAL HISTORY:  Jo [name] is divorced and lives alone in [place], [place].  She was previously a flight attendant, but is unable to work with her current physical issues. She does not have any children and her sister and brother in law are her emergency contact, and live nearby.      ADVANCED DIRECTIVES:    HEALTH MAINTENANCE: Social History   Tobacco Use   Smoking status: Never   Smokeless tobacco: Never  Vaping Use   Vaping Use: Never used  Substance Use Topics   Alcohol use: No   Drug use: No     Colonoscopy:2013  PAP:   Bone density:   Allergies  Allergen Reactions   Bee Pollen Anaphylaxis   Diazepam     sucidal depression   Prednisone Nausea And Vomiting    Per pt can not take oral steroids    Amoxicillin     Severe diarrhea At high dose    Doxycycline     Pt does not remember reaction   Erythromycin     n & v   Morphine     n & v   Propoxyphene N-Acetaminophen     n & V   Sulfa Antibiotics     MIGRAINES   Tetracyclines & Related     N & v   Propoxyphene Other (See Comments)    Current Outpatient Medications  Medication Sig Dispense Refill   azelastine (ASTELIN) 0.1 % nasal spray Place 1 spray into both nostrils 2 (two) times daily. Use in each nostril as directed 30 mL 2   Belimumab (BENLYSTA) 200 MG/ML SOSY once a week.      busPIRone (BUSPAR) 15 MG tablet TAKE 1/2 TABLET BY MOUTH AT BREAKFAST AND AT BEDTIME 90 tablet 0   cholecalciferol (VITAMIN D) 1000 UNITS tablet Take 2,000 Units by mouth daily.     clindamycin (CLEOCIN T) 1 % lotion Apply 1 application topically 2 (two) times daily as needed.     Cranberry 200 MG CAPS Take 200 mg by mouth 2 (two) times daily.     cyanocobalamin (,VITAMIN B-12,) 1000 MCG/ML injection INJECT ONE ML INTRAMUSCULARLY FOR ONE DOSE ALTERNATING EVERY TWO WEEKS, THEN MONHTLY 10 mL 3   erythromycin with ethanol (EMGEL) 2 % gel Apply topically 2 (two) times daily. (Patient taking differently: Apply topically 2 (two) times daily as needed.) 30 g 1   estradiol (ESTRACE) 2 MG tablet Take 0.5 tablets (1 mg total) by mouth every morning. 90 tablet 0   fexofenadine-pseudoephedrine (ALLEGRA-D 24) 180-240 MG 24 hr tablet Take 1 tablet by mouth at bedtime.     fluticasone (CUTIVATE) 0.05 % cream Apply 1 application topically 2 (two) times daily as needed (rash).      fluticasone (FLONASE) 50 MCG/ACT nasal spray Place 2 sprays into both nostrils daily. 16 g 5   folic acid (FOLVITE) 1 MG tablet Take 1 tablet (1 mg total) by mouth daily. (Patient taking differently: Take 1 mg by mouth in the morning and at bedtime.) 30 tablet 6   HYDROmorphone (DILAUDID) 4 MG tablet Take by mouth every 4 (four) hours as needed for severe pain.     lamoTRIgine (LAMICTAL) 100 MG tablet TAKE ONE TABLET BY MOUTH AT BREAKFAST AND AT BEDTIME 180 tablet 0   levothyroxine (SYNTHROID) 125 MCG tablet TAKE ONE TABLET BY MOUTH EVERY MORNING  90 tablet 0   LYSINE PO Take 1 tablet by mouth daily.     medroxyPROGESTERone (PROVERA) 2.5 MG tablet Take 2.5 mg by mouth daily.     metFORMIN (GLUCOPHAGE) 500 MG tablet Take 1,000 mg by mouth daily with breakfast.     methadone (DOLOPHINE) 5 MG tablet Take 1 tablet by mouth 2 (two) times daily.     metoCLOPramide (REGLAN) 10 MG tablet Take 1 tablet (10 mg total) by mouth every 6 (six)  hours as needed for nausea. 30 tablet 0   montelukast (SINGULAIR) 10 MG tablet TAKE ONE TABLET BY MOUTH EVERYDAY AT BEDTIME 90 tablet 0   Omeprazole-Sodium Bicarbonate (ZEGERID) 20-1100 MG CAPS capsule Take 1 capsule by mouth daily. 90 capsule 1   ondansetron (ZOFRAN) 4 MG tablet Take 1 tablet (4 mg total) by mouth every 8 (eight) hours as needed for nausea. 30 tablet 3   promethazine (PHENERGAN) 25 MG tablet Take 1 tablet (25 mg total) by mouth every 8 (eight) hours as needed for up to 20 doses for nausea or vomiting. Take one tablet every 8- 12 hours as needed. 20 tablet 0   sertraline (ZOLOFT) 100 MG tablet TAKE TWO TABLETS BY MOUTH EVERY MORNING 180 tablet 0   sucralfate (CARAFATE) 1 g tablet Take 1 tablet (1 g total) by mouth 4 (four) times daily -  with meals and at bedtime for 14 days. 56 tablet 0   Syringe/Needle, Disp, (SYRINGE 3CC/25GX1-1/2") 25G X 1-1/2" 3 ML MISC Inject 1cc (1048mg) B12 q 2-4 weeks as directed 100 each 3   tiZANidine (ZANAFLEX) 4 MG capsule Take 3 capsules (12 mg total) by mouth at bedtime as needed for muscle spasms. 90 capsule 1   topiramate (TOPAMAX) 25 MG tablet TAKE 1 TABLET 5 TIMES A DAY 150 tablet 1   triamcinolone cream (KENALOG) 0.1 % daily as needed.     Current Facility-Administered Medications  Medication Dose Route Frequency Provider Last Rate Last Admin   cyanocobalamin ((VITAMIN B-12)) injection 1,000 mcg  1,000 mcg Intramuscular Q14 Days Koberlein, Junell C, MD   1,000 mcg at 05/25/18 1244    OBJECTIVE:  There were no vitals filed for this visit.    There is no height or weight on file to calculate BMI.   Wt Readings from Last 3 Encounters:  03/11/21 140 lb 3.2 oz (63.6 kg)  02/05/21 139 lb 4.8 oz (63.2 kg)  02/05/21 138 lb 12.8 oz (63 kg)      ECOG FS:1 - Symptomatic but completely ambulatory GENERAL: Patient is a well appearing female in no acute distress HEENT:  Sclerae anicteric.  Mask in place. Neck is supple.  NODES:  No cervical,  supraclavicular, or axillary lymphadenopathy palpated.  BREAST EXAM:  Deferred. LUNGS:  Clear to auscultation bilaterally.  No wheezes or rhonchi. HEART:  Regular rate and rhythm. No murmur appreciated. ABDOMEN:  Soft, nontender.  Positive, normoactive bowel sounds. No organomegaly palpated. MSK:  No focal spinal tenderness to palpation.  EXTREMITIES:  No peripheral edema.   SKIN:  Intact, no rashes or lesions noted. NEURO:  Nonfocal. Well oriented.  Appropriate affect.     LAB RESULTS:  CMP     Component Value Date/Time   NA 141 03/11/2021 1536   K 4.7 03/11/2021 1536   CL 104 03/11/2021 1536   CO2 21 03/11/2021 1536   GLUCOSE 95 03/11/2021 1536   GLUCOSE 97 01/07/2021 1410   BUN 17 03/11/2021 1536   CREATININE 1.03 (H) 03/11/2021 1536  CREATININE 0.99 08/13/2020 1538   CALCIUM 9.8 03/11/2021 1536   PROT 7.0 01/07/2021 1410   ALBUMIN 4.3 01/07/2021 1410   AST 16 01/07/2021 1410   AST 15 08/13/2020 1538   ALT 9 01/07/2021 1410   ALT 10 08/13/2020 1538   ALKPHOS 42 01/07/2021 1410   BILITOT 0.6 01/07/2021 1410   BILITOT 0.2 (L) 08/13/2020 1538   GFRNONAA >60 01/07/2021 1410   GFRNONAA >60 08/13/2020 1538   GFRAA >60 05/15/2020 1508    No results found for: TOTALPROTELP, ALBUMINELP, A1GS, A2GS, BETS, BETA2SER, GAMS, MSPIKE, SPEI  No results found for: Nils Pyle, [facility]  Lab Results  Component Value Date   WBC 4.9 03/11/2021   NEUTROABS 3.4 01/07/2021   HGB 13.2 03/11/2021   HCT 39.8 03/11/2021   MCV 90 03/11/2021   PLT 291 03/11/2021      Chemistry      Component Value Date/Time   NA 141 03/11/2021 1536   K 4.7 03/11/2021 1536   CL 104 03/11/2021 1536   CO2 21 03/11/2021 1536   BUN 17 03/11/2021 1536   CREATININE 1.03 (H) 03/11/2021 1536   CREATININE 0.99 08/13/2020 1538      Component Value Date/Time   CALCIUM 9.8 03/11/2021 1536   ALKPHOS 42 01/07/2021 1410   AST 16 01/07/2021 1410   AST 15 08/13/2020 1538   ALT 9 01/07/2021  1410   ALT 10 08/13/2020 1538   BILITOT 0.6 01/07/2021 1410   BILITOT 0.2 (L) 08/13/2020 1538       No results found for: LABCA2  No components found for: [id number]  No results for input(s): INR in the last 168 hours.  No results found for: LABCA2  No results found for: [id number]  No results found for: HYI502  No results found for: DXA128  No results found for: CA2729  No components found for: HGQUANT  No results found for: CEA1 / No results found for: CEA1   No results found for: AFPTUMOR  No results found for: CHROMOGRNA  No results found for: PSA1  No visits with results within 3 Day(s) from this visit.  Latest known visit with results is:  Appointment on 04/02/2021  Component Date Value Ref Range Status   Area-P 1/2 04/02/2021 2.56  cm2 Final   S' Lateral 04/02/2021 2.80  cm Final    (this displays the last labs from the last 3 days)  No results found for: TOTALPROTELP, ALBUMINELP, A1GS, A2GS, BETS, BETA2SER, GAMS, MSPIKE, SPEI (this displays SPEP labs)  No results found for: KPAFRELGTCHN, LAMBDASER, KAPLAMBRATIO (kappa/lambda light chains)  No results found for: HGBA, HGBA2QUANT, HGBFQUANT, HGBSQUAN (Hemoglobinopathy evaluation)   No results found for: LDH  Lab Results  Component Value Date   IRON 108 08/13/2020   TIBC 353 08/13/2020   IRONPCTSAT 31 08/13/2020   (Iron and TIBC)  Lab Results  Component Value Date   FERRITIN 152 08/13/2020    Urinalysis    Component Value Date/Time   COLORURINE YELLOW 01/07/2021 1358   APPEARANCEUR HAZY (A) 01/07/2021 1358   LABSPEC 1.012 01/07/2021 1358   PHURINE 7.0 01/07/2021 1358   GLUCOSEU NEGATIVE 01/07/2021 1358   [place] 01/07/2021 1358   BILIRUBINUR NEGATIVE 01/07/2021 1358   KETONESUR 20 (A) 01/07/2021 1358   PROTEINUR NEGATIVE 01/07/2021 1358   UROBILINOGEN 0.2 12/16/2010 1802   NITRITE NEGATIVE 01/07/2021 1358   LEUKOCYTESUR LARGE (A) 01/07/2021 1358     STUDIES: ECHOCARDIOGRAM  COMPLETE  Result Date: 04/02/2021  ECHOCARDIOGRAM REPORT   Patient Name:   Jo [name]  Date of Exam: 04/02/2021 Medical Rec #:  [id number]     Height:       64.0 in Accession #:    [id number]    Weight:       140.2 lb Date of Birth:  1961-03-29    BSA:          1.682 m Patient Age:    60 years      BP:           120/82 mmHg Patient Gender: F             HR:           61 bpm. Exam Location:  [address] Procedure: 2D Echo, Color Doppler, Cardiac Doppler and Strain Analysis Indications:    R06.00 SOB  History:        Patient has no prior history of Echocardiogram examinations.                 COPD, Arrythmias:SVT, Signs/Symptoms:Syncope; Risk Factors:Sleep                 Apnea.  Sonographer:    Marygrace Drought RCS Referring Phys: [phone] HAO MENG IMPRESSIONS  1. Left ventricular ejection fraction, by estimation, is 55 to 60%. The left ventricle has normal function. The left ventricle has no regional wall motion abnormalities. Left ventricular diastolic parameters were normal. The average left ventricular global longitudinal strain is -19.2 %. The global longitudinal strain is normal.  2. Right ventricular systolic function is normal. The right ventricular size is normal. There is normal pulmonary artery systolic pressure.  3. The mitral valve is normal in structure. Trivial mitral valve regurgitation. No evidence of mitral stenosis.  4. The aortic valve is tricuspid. There is mild thickening of the aortic valve. Aortic valve regurgitation is not visualized. Mild aortic valve sclerosis is present, with no evidence of aortic valve stenosis.  5. The inferior vena cava is normal in size with <50% respiratory variability, suggesting right atrial pressure of 8 mmHg. Comparison(s): No prior Echocardiogram. FINDINGS  Left Ventricle: Left ventricular ejection fraction, by estimation, is 55 to 60%. The left ventricle has normal function. The left ventricle has no regional wall motion abnormalities. The average left  ventricular global longitudinal strain is -19.2 %. The global longitudinal strain is normal. The left ventricular internal cavity size was normal in size. There is borderline concentric left ventricular hypertrophy. Left ventricular diastolic parameters were normal. Right Ventricle: The right ventricular size is normal. No increase in right ventricular wall thickness. Right ventricular systolic function is normal. There is normal pulmonary artery systolic pressure. The tricuspid regurgitant velocity is 2.23 m/s, and  with an assumed right atrial pressure of 3 mmHg, the estimated right ventricular systolic pressure is 07.3 mmHg. Left Atrium: Left atrial size was normal in size. Right Atrium: Right atrial size was normal in size. Pericardium: There is no evidence of pericardial effusion. Mitral Valve: The mitral valve is normal in structure. There is mild thickening of the mitral valve leaflet(s). Trivial mitral valve regurgitation. No evidence of mitral valve stenosis. Tricuspid Valve: The tricuspid valve is normal in structure. Tricuspid valve regurgitation is trivial. Aortic Valve: The aortic valve is tricuspid. There is mild thickening of the aortic valve. Aortic valve regurgitation is not visualized. Mild aortic valve sclerosis is present, with no evidence of aortic valve stenosis. Pulmonic Valve: The pulmonic valve was normal in structure. Pulmonic valve regurgitation  is not visualized. Aorta: The aortic root and ascending aorta are structurally normal, with no evidence of dilitation. Venous: The inferior vena cava is normal in size with less than 50% respiratory variability, suggesting right atrial pressure of 8 mmHg. IAS/Shunts: No atrial level shunt detected by color flow Doppler.  LEFT VENTRICLE PLAX 2D LVIDd:         4.00 cm  Diastology LVIDs:         2.80 cm  LV e' medial:    7.94 cm/s LV PW:         1.00 cm  LV E/e' medial:  8.8 LV IVS:        1.00 cm  LV e' lateral:   17.20 cm/s LVOT diam:     2.00 cm   LV E/e' lateral: 4.1 LV SV:         69 LV SV Index:   41       2D Longitudinal Strain LVOT Area:     3.14 cm 2D Strain GLS Avg:     -19.2 %  RIGHT VENTRICLE RV Basal diam:  3.80 cm RV S prime:     12.40 cm/s TAPSE (M-mode): 2.2 cm RVSP:           22.9 mmHg LEFT ATRIUM             Index       RIGHT ATRIUM           Index LA diam:        3.80 cm 2.26 cm/m  RA Pressure: 3.00 mmHg LA Vol (A2C):   42.8 ml 25.44 ml/m RA Area:     13.90 cm LA Vol (A4C):   33.7 ml 20.03 ml/m RA Volume:   33.50 ml  19.91 ml/m LA Biplane Vol: 40.6 ml 24.13 ml/m  AORTIC VALVE LVOT Vmax:   93.30 cm/s LVOT Vmean:  71.800 cm/s LVOT VTI:    0.219 m  AORTA Ao Root diam: 3.00 cm Ao Asc diam:  3.50 cm MITRAL VALVE               TRICUSPID VALVE MV Area (PHT):             TR Peak grad:   19.9 mmHg MV Decel Time:             TR Vmax:        223.00 cm/s MV E velocity: 69.80 cm/s  Estimated RAP:  3.00 mmHg MV A velocity: 37.70 cm/s  RVSP:           22.9 mmHg MV E/A ratio:  1.85                            SHUNTS                            Systemic VTI:  0.22 m                            Systemic Diam: 2.00 cm Gwyndolyn Kaufman MD Electronically signed by Gwyndolyn Kaufman MD Signature Date/Time: 04/02/2021/4:42:51 PM    Final     ASSESSMENT: 60 y.o. Jo [name] woman who is here today for management of her iron deficiency.   1. Longstanding chronic fatigue, worsened since 2020  (a) positive ANA, CRP, ESR noted on 11/29/2019  (b) under care of Dr. [name]--dx with  lupus  2.  Iron deficiency dating back to 2013  (a) unable to tolerate oral iron  (b) [name] recent ferritin on 2/24 was 8  (c) s/p IV iron with Feraheme on 3/31 & 12/27/2019  3. Folate deficiency  (a) 11/15/2019 was noted to be 6  (b) Taking oral BID, and last level was 56.7 (normal) on 08/13/2020  PLAN:  Ovie is here today for follow up of her labs. Her hemoglobin is stable today.  She continues under the care of Dr. Kathlene November for lupus and will continue to f/u with him for her  treatment.    We will await her iron studies and folate to see if there are any indication for repeat administration of IV iron.  I am reassured that she has undergone colonoscopy, upper endoscopy, and gynecologic evaluation this past year.    Kareem will return in 6 months for lab only and in 1 year for lab and f/u.   Durinda is in agreement with this plan.  She knows to call if she needs [place] between now and her next visit with [place].  Total encounter time: 20 minutes* in chart review, lab review, face to face visit time, and documentation of the encounter.    Wilber Bihari, NP 04/28/21 7:59 AM Medical Oncology and Hematology [facility] [place], [place] [address] Tel. [phone]    Fax. [phone]   *Total Encounter Time as defined by the Centers for Medicare and Medicaid Services includes, in addition to the face-to-face time of a patient visit (documented in the note above) non-face-to-face time: obtaining and reviewing outside history, ordering and reviewing medications, tests or procedures, care coordination (communications with other health care professionals or caregivers) and documentation in the medical record.

## 2021-04-30 ENCOUNTER — Ambulatory Visit: Payer: PPO | Admitting: Internal Medicine

## 2021-04-30 ENCOUNTER — Telehealth: Payer: Self-pay

## 2021-04-30 NOTE — Telephone Encounter (Signed)
Patient notified.  All questions answered, no further needs at this time.

## 2021-04-30 NOTE — Telephone Encounter (Signed)
-----   Message from Gardenia Phlegm, NP sent at 04/29/2021 10:33 AM EDT ----- Please let Tnia know that her labs are good and we will see her back as we discussed.  Thank you! ----- Message ----- From: Interface, Lab In [place] Sent: 04/28/2021   1:29 PM EDT To: Gardenia Phlegm, NP

## 2021-05-05 ENCOUNTER — Ambulatory Visit: Payer: PPO

## 2021-05-05 ENCOUNTER — Ambulatory Visit: Payer: PPO | Admitting: Adult Health

## 2021-05-14 ENCOUNTER — Ambulatory Visit: Payer: PPO

## 2021-05-14 ENCOUNTER — Ambulatory Visit: Payer: PPO | Admitting: Adult Health

## 2021-05-19 ENCOUNTER — Other Ambulatory Visit: Payer: Self-pay | Admitting: Family Medicine

## 2021-05-19 DIAGNOSIS — N951 Menopausal and female climacteric states: Secondary | ICD-10-CM

## 2021-06-02 ENCOUNTER — Ambulatory Visit: Payer: PPO | Admitting: Obstetrics & Gynecology

## 2021-06-02 DIAGNOSIS — I7300 Raynaud's syndrome without gangrene: Secondary | ICD-10-CM | POA: Diagnosis not present

## 2021-06-02 DIAGNOSIS — Z23 Encounter for immunization: Secondary | ICD-10-CM | POA: Diagnosis not present

## 2021-06-02 DIAGNOSIS — G9050 Complex regional pain syndrome I, unspecified: Secondary | ICD-10-CM | POA: Diagnosis not present

## 2021-06-02 DIAGNOSIS — M791 Myalgia, unspecified site: Secondary | ICD-10-CM | POA: Diagnosis not present

## 2021-06-02 DIAGNOSIS — K219 Gastro-esophageal reflux disease without esophagitis: Secondary | ICD-10-CM | POA: Diagnosis not present

## 2021-06-02 DIAGNOSIS — M1990 Unspecified osteoarthritis, unspecified site: Secondary | ICD-10-CM | POA: Diagnosis not present

## 2021-06-02 DIAGNOSIS — M329 Systemic lupus erythematosus, unspecified: Secondary | ICD-10-CM | POA: Diagnosis not present

## 2021-06-02 DIAGNOSIS — G8929 Other chronic pain: Secondary | ICD-10-CM | POA: Diagnosis not present

## 2021-06-02 DIAGNOSIS — R5383 Other fatigue: Secondary | ICD-10-CM | POA: Diagnosis not present

## 2021-06-02 DIAGNOSIS — R768 Other specified abnormal immunological findings in serum: Secondary | ICD-10-CM | POA: Diagnosis not present

## 2021-06-02 DIAGNOSIS — M359 Systemic involvement of connective tissue, unspecified: Secondary | ICD-10-CM | POA: Diagnosis not present

## 2021-06-04 ENCOUNTER — Encounter: Payer: Self-pay | Admitting: Family Medicine

## 2021-06-04 ENCOUNTER — Telehealth (INDEPENDENT_AMBULATORY_CARE_PROVIDER_SITE_OTHER): Payer: PPO | Admitting: Family Medicine

## 2021-06-04 VITALS — Wt 155.0 lb

## 2021-06-04 DIAGNOSIS — G9059 Complex regional pain syndrome I of other specified site: Secondary | ICD-10-CM | POA: Diagnosis not present

## 2021-06-04 DIAGNOSIS — M2550 Pain in unspecified joint: Secondary | ICD-10-CM

## 2021-06-04 DIAGNOSIS — M329 Systemic lupus erythematosus, unspecified: Secondary | ICD-10-CM | POA: Diagnosis not present

## 2021-06-04 DIAGNOSIS — J4520 Mild intermittent asthma, uncomplicated: Secondary | ICD-10-CM

## 2021-06-04 MED ORDER — "SYRINGE 25G X 1-1/2"" 3 ML MISC": 3 refills | Status: DC

## 2021-06-04 MED ORDER — KETOROLAC TROMETHAMINE 30 MG/ML IJ SOLN: 30.0000 mg | Freq: Every day | INTRAMUSCULAR | 2 refills | Status: DC | PRN

## 2021-06-04 NOTE — Progress Notes (Signed)
Virtual Visit via Video Note  I connected with Jo [name] on 06/04/21 at  1:00 PM EDT by a video enabled telemedicine application and verified that I am speaking with the correct person using two identifiers.  Location patient: home Location provider: [place], [place] [address] Persons participating in the virtual visit: patient, provider  I discussed the limitations of evaluation and management by telemedicine and the availability of in person appointments. The patient expressed understanding and agreed to proceed.   Jo [name] DOB: May 28, 1961 Encounter date: 06/04/2021  This is a 60 y.o. female who presents with Chief Complaint  Patient presents with   Follow-up   Abdominal Pain    Patient complains of abdominal pain, "inside body" and joint pain since flu  and Covid vaccines 2 days ago    History of present illness: Has had a lot of heart issues in last couple of weeks; acting up. Went Monday and got covid vaccination and flu shot. Tuesday woke up in severe pain in joints, torso, back. Increased pain and painful to breathe. Feels about the same today as yesterday. Upper abdominal area.   Does have pain in fingers and wrists. Didn't react this way with other immunizations.   Pacer isn't working right. Sometimes feels like palpitations are so bad that heart is going to beat through chest.   Has always had some cough and feels like it is worse when she is having heart issues.   Bowels have been about the same. Either diarrhea, constipation. Nauseous [name] of the time. Zofran doesn't work as well as it used to; esp not since recent hospitalization. Follows back up with GI at the end of the month. Having to take 8 mg of zofran to get same effect; not always effective. Sometimes needing phenergan as well.   Had to restart hormones - hot flashes, not sleeping; just felt like she was dying off of medication. Hot flashes off medication were  really bad all day and terrible at night.   In truth hasn't felt well since she was in her late 14's early 72's. Hasn't felt well since getting lupron injections. For years had some edema, face seemed swollen. Fatigue just kept getting worse and worse until she just couldn't go anymore. Depression has improved.   Took some excedrin yesterday. Did seem to help with pain.   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 Tries to limit to twice a day. Nucynta was not good med for her.methotrexate seemed to help pain in areas like legs - even walking was better.   Allergies  Allergen Reactions   Bee Pollen Anaphylaxis   Diazepam     sucidal depression   Prednisone Nausea And Vomiting    Per pt can not take oral steroids    Amoxicillin     Severe diarrhea At high dose    Doxycycline     Pt does not remember reaction   Erythromycin     n & v   Gabapentin     Doesn't remember; didn't tolerate   Methotrexate Derivatives     diarrhea   Morphine     n & v   Propoxyphene N-Acetaminophen     n & V   Sulfa Antibiotics     MIGRAINES   Tetracyclines & Related     N & v   Propoxyphene Other (See Comments)   Current Meds  Medication Sig   azelastine (ASTELIN) 0.1 % nasal spray Place  1 spray into both nostrils 2 (two) times daily. Use in each nostril as directed   Belimumab (BENLYSTA) 200 MG/ML SOSY once a week.   cholecalciferol (VITAMIN D) 1000 UNITS tablet Take 2,000 Units by mouth daily.   clindamycin (CLEOCIN T) 1 % lotion Apply 1 application topically 2 (two) times daily as needed.   Cranberry 200 MG CAPS Take 200 mg by mouth 2 (two) times daily.   cyanocobalamin (,VITAMIN B-12,) 1000 MCG/ML injection INJECT ONE ML INTRAMUSCULARLY FOR ONE DOSE ALTERNATING EVERY TWO WEEKS, THEN MONHTLY   erythromycin with ethanol (EMGEL) 2 % gel Apply topically 2 (two) times daily. (Patient taking differently: Apply topically 2 (two) times daily as needed.)   estradiol (ESTRACE) 2 MG tablet Take 0.5 tablets (1  mg total) by mouth every morning.   fexofenadine-pseudoephedrine (ALLEGRA-D 24) 180-240 MG 24 hr tablet Take 1 tablet by mouth at bedtime.   fluticasone (CUTIVATE) 0.05 % cream Apply 1 application topically 2 (two) times daily as needed (rash).    fluticasone (FLONASE) 50 MCG/ACT nasal spray Place 2 sprays into both nostrils daily.   folic acid (FOLVITE) 1 MG tablet Take 1 tablet (1 mg total) by mouth daily. (Patient taking differently: Take 1 mg by mouth in the morning and at bedtime.)   HYDROmorphone (DILAUDID) 4 MG tablet Take by mouth every 4 (four) hours as needed for severe pain.   ketorolac (TORADOL) 30 MG/ML injection Inject 1 mL (30 mg total) into the muscle daily as needed for moderate pain.   lamoTRIgine (LAMICTAL) 100 MG tablet TAKE ONE TABLET BY MOUTH AT BREAKFAST AND AT BEDTIME   levothyroxine (SYNTHROID) 125 MCG tablet TAKE ONE TABLET BY MOUTH EVERY MORNING   LYSINE PO Take 1 tablet by mouth daily.   medroxyPROGESTERone (PROVERA) 2.5 MG tablet Take 2.5 mg by mouth daily.   methadone (DOLOPHINE) 5 MG tablet Take 1 tablet by mouth 2 (two) times daily.   metoCLOPramide (REGLAN) 10 MG tablet Take 1 tablet (10 mg total) by mouth every 6 (six) hours as needed for nausea.   Omeprazole-Sodium Bicarbonate (ZEGERID) 20-1100 MG CAPS capsule Take 1 capsule by mouth daily.   ondansetron (ZOFRAN) 4 MG tablet Take 1 tablet (4 mg total) by mouth every 8 (eight) hours as needed for nausea.   PROGESTERONE PO Take 2.5 mg by mouth daily.   promethazine (PHENERGAN) 25 MG tablet Take 1 tablet (25 mg total) by mouth every 8 (eight) hours as needed for up to 20 doses for nausea or vomiting. Take one tablet every 8- 12 hours as needed.   Syringe/Needle, Disp, (SYRINGE 3CC/25GX1-1/2") 25G X 1-1/2" 3 ML MISC Inject 1cc (1056mg) B12 q 2-4 weeks as directed   tiZANidine (ZANAFLEX) 4 MG capsule Take 3 capsules (12 mg total) by mouth at bedtime as needed for muscle spasms.   triamcinolone cream (KENALOG) 0.1 %  daily as needed.   [DISCONTINUED] busPIRone (BUSPAR) 15 MG tablet TAKE 1/2 TABLET BY MOUTH AT BREAKFAST AND AT BEDTIME   [DISCONTINUED] sertraline (ZOLOFT) 100 MG tablet TAKE TWO TABLETS BY MOUTH EVERY MORNING   [DISCONTINUED] Syringe/Needle, Disp, (SYRINGE 3CC/25GX1-1/2") 25G X 1-1/2" 3 ML MISC Inject 1cc (10022m) B12 q 2-4 weeks as directed   [DISCONTINUED] topiramate (TOPAMAX) 25 MG tablet TAKE 1 TABLET 5 TIMES A DAY   Current Facility-Administered Medications for the 06/04/21 encounter (Video Visit) with KoCaren MacadamMD  Medication   cyanocobalamin ((VITAMIN B-12)) injection 1,000 mcg    Review of Systems  Constitutional:  Positive  for fatigue. Negative for chills and fever.  Respiratory:  Negative for cough, chest tightness, shortness of breath and wheezing.   Cardiovascular:  Negative for chest pain, palpitations and leg swelling.  Gastrointestinal:  Positive for abdominal pain (chronic).  Neurological:  Positive for weakness.   Objective:  Wt 155 lb (70.3 kg)   BMI 26.61 kg/m   Weight: 155 lb (70.3 kg)   BP Readings from Last 3 Encounters:  04/28/21 113/73  03/11/21 100/78  02/13/21 118/77   Wt Readings from Last 3 Encounters:  06/04/21 155 lb (70.3 kg)  04/28/21 147 lb 3.2 oz (66.8 kg)  03/11/21 140 lb 3.2 oz (63.6 kg)    EXAM:  GENERAL: alert, oriented, appears sick, but in no acute distress  HEENT: atraumatic, conjunctiva clear, no obvious abnormalities on inspection of external nose and ears  NECK: normal movements of the head and neck  LUNGS: on inspection no signs of respiratory distress, breathing rate appears normal, no obvious gross SOB, gasping or wheezing  CV: no obvious cyanosis  MS: moves all visible extremities without noticeable abnormality  PSYCH/NEURO: pleasant and cooperative, no obvious depression or anxiety, speech and thought processing grossly intact   Assessment/Plan  1. Arthralgia, unspecified joint She has had a flare of  pain since getting vaccination. She has responded well in the past to toradol injections and cannot take anti-inflammatories well by mouth due to chronic stomach issues. After discussion, she does feel she would do well with being able to give home injection of toradol and this would help her from having to come out to doctor when she has pain flares in future. This will be used sparingly.  She is knowledgeable and being able to give self injections and she already does her B12 injection.  She will let me know if this does not help with pain.  2. Mild intermittent asthma without complication Breathing has been stable with allergy control.  3. Complex regional pain syndrome type 1 affecting other site See above.  She does see pain management.  4. Systemic lupus erythematosus, unspecified SLE type, unspecified organ involvement status [facility]) She is following regularly with rheumatology.  I am hopeful that home Toradol injection will help get her back to a better level of functioning.   Return if symptoms worsen or fail to improve.    I discussed the assessment and treatment plan with the patient. The patient was provided an opportunity to ask questions and all were answered. The patient agreed with the plan and demonstrated an understanding of the instructions.   The patient was advised to call back or seek an in-person evaluation if the symptoms worsen or if the condition fails to improve as anticipated.  I provided 30 minutes of non-face-to-face time during this encounter.   Micheline Rough, MD

## 2021-06-09 ENCOUNTER — Telehealth: Payer: Self-pay | Admitting: Cardiovascular Disease

## 2021-06-09 DIAGNOSIS — Z79891 Long term (current) use of opiate analgesic: Secondary | ICD-10-CM | POA: Diagnosis not present

## 2021-06-09 DIAGNOSIS — M542 Cervicalgia: Secondary | ICD-10-CM | POA: Diagnosis not present

## 2021-06-09 DIAGNOSIS — G90512 Complex regional pain syndrome I of left upper limb: Secondary | ICD-10-CM | POA: Diagnosis not present

## 2021-06-09 DIAGNOSIS — G894 Chronic pain syndrome: Secondary | ICD-10-CM | POA: Diagnosis not present

## 2021-06-09 NOTE — Telephone Encounter (Signed)
Patient c/o Palpitations:  High priority if patient c/o lightheadedness, shortness of breath, or chest pain  How long have you had palpitations/irregular HR/ Afib? Are you having the symptoms now? yes  Are you currently experiencing lightheadedness, SOB or CP? SOB, CP  Do you have a history of afib (atrial fibrillation) or irregular heart rhythm? yes  Have you checked your BP or HR? (document readings if available): no  Are you experiencing any other symptoms? Dizziness and nausea

## 2021-06-09 NOTE — Telephone Encounter (Signed)
Received call into triage- patient has had bad palpitations, chest pain/discomfort, shortness of breath (with exertion), for the past 2 weeks. Patient states that her blood pressure has been all over the place, going low 70/40, and higher for her 135/70 HR in the 40's at times. Patient denies changes to medications. Does mention having weight gain- 12 lbs in the last 2 weeks, and swelling in her legs. She denies improvement over night with them elevated. Patient has an appointment with Dr.Berry on 06/18/2021, but with symptoms, weight gain and hard painful palpitations I did mention to patient to go to the ED to seek care and be evaluated. Patient refused to go to ED, she was wanting to be seen today or this week, I did not have any opening spots- but did recommend she be evaluated. Patient states she will wait for her appointment and hope that she is still having these same symptoms during her visit. Patient advised I would send a message and call back with any other recommendations from MD.  Patient verbalized understanding.

## 2021-06-12 ENCOUNTER — Other Ambulatory Visit: Payer: Self-pay | Admitting: Family Medicine

## 2021-06-12 DIAGNOSIS — N951 Menopausal and female climacteric states: Secondary | ICD-10-CM

## 2021-06-12 DIAGNOSIS — F341 Dysthymic disorder: Secondary | ICD-10-CM

## 2021-06-13 ENCOUNTER — Other Ambulatory Visit: Payer: Self-pay | Admitting: Family Medicine

## 2021-06-13 ENCOUNTER — Telehealth: Payer: Self-pay | Admitting: Family Medicine

## 2021-06-13 ENCOUNTER — Other Ambulatory Visit: Payer: Self-pay

## 2021-06-13 ENCOUNTER — Encounter: Payer: Self-pay | Admitting: Family Medicine

## 2021-06-13 DIAGNOSIS — N951 Menopausal and female climacteric states: Secondary | ICD-10-CM

## 2021-06-13 MED ORDER — MEDROXYPROGESTERONE ACETATE 2.5 MG PO TABS: 2.5000 mg | ORAL_TABLET | Freq: Every day | ORAL | 1 refills | Status: DC

## 2021-06-13 MED ORDER — ESTRADIOL 2 MG PO TABS: 1.0000 mg | ORAL_TABLET | Freq: Every morning | ORAL | 1 refills | Status: DC

## 2021-06-13 NOTE — Telephone Encounter (Signed)
Patient wants refill on both estradiol (ESTRACE) 2 MG tablet and medroxyPROGESTERone (PROVERA) 2.5 MG tablet    Patient wants 90 day supply as they prepackage it  Please send to  [place], [place] - [address] Phone:  [phone]  Fax:  [phone]      Good callback number is [phone]     Please Advise

## 2021-06-13 NOTE — Telephone Encounter (Signed)
Attempted to explain to the patient to contact [facility] for refills as there is a note in the system and the Rxs were given by their office previously.  Patient stated Dr Ethlyn Gallery has always given her refills on this medication and she talked with her about this 2 days ago.  Message sent to PCP.

## 2021-06-16 ENCOUNTER — Telehealth: Payer: Self-pay | Admitting: Family Medicine

## 2021-06-16 NOTE — Telephone Encounter (Signed)
This is what was on her med list. Please clarify what medications are supposed to be?

## 2021-06-16 NOTE — Telephone Encounter (Signed)
Patient stated that the medication she was prescribed was wrong. The medication that was prescribed was estradiol (ESTRACE) 2 MG tablet [[id number]]  and medroxyPROGESTERone (PROVERA) 2.5 MG tablet [[id number]] .  Patient could be contacted at [phone] with any questions.  Please advise.

## 2021-06-17 ENCOUNTER — Other Ambulatory Visit: Payer: Self-pay | Admitting: Family Medicine

## 2021-06-17 DIAGNOSIS — N951 Menopausal and female climacteric states: Secondary | ICD-10-CM

## 2021-06-17 MED ORDER — ESTRADIOL 2 MG PO TABS: 2.0000 mg | ORAL_TABLET | Freq: Every day | ORAL | 1 refills | Status: DC

## 2021-06-17 MED ORDER — MEDROXYPROGESTERONE ACETATE 2.5 MG PO TABS: ORAL_TABLET | ORAL | 1 refills | Status: DC

## 2021-06-17 NOTE — Telephone Encounter (Signed)
Noted! Rx sent in 

## 2021-06-18 ENCOUNTER — Encounter: Payer: Self-pay | Admitting: Cardiovascular Disease

## 2021-06-18 ENCOUNTER — Telehealth: Payer: Self-pay | Admitting: Pharmacist

## 2021-06-18 ENCOUNTER — Ambulatory Visit: Payer: PPO | Admitting: Internal Medicine

## 2021-06-18 ENCOUNTER — Ambulatory Visit: Payer: PPO | Admitting: Cardiovascular Disease

## 2021-06-18 ENCOUNTER — Other Ambulatory Visit: Payer: Self-pay

## 2021-06-18 ENCOUNTER — Encounter: Payer: Self-pay | Admitting: Internal Medicine

## 2021-06-18 VITALS — BP 100/62 | HR 73 | Ht 64.0 in | Wt 156.1 lb

## 2021-06-18 DIAGNOSIS — R110 Nausea: Secondary | ICD-10-CM | POA: Diagnosis not present

## 2021-06-18 DIAGNOSIS — R12 Heartburn: Secondary | ICD-10-CM

## 2021-06-18 DIAGNOSIS — M329 Systemic lupus erythematosus, unspecified: Secondary | ICD-10-CM | POA: Diagnosis not present

## 2021-06-18 DIAGNOSIS — R002 Palpitations: Secondary | ICD-10-CM | POA: Diagnosis not present

## 2021-06-18 DIAGNOSIS — R0789 Other chest pain: Secondary | ICD-10-CM | POA: Insufficient documentation

## 2021-06-18 DIAGNOSIS — K582 Mixed irritable bowel syndrome: Secondary | ICD-10-CM | POA: Diagnosis not present

## 2021-06-18 MED ORDER — ONDANSETRON HCL 8 MG PO TABS: 8.0000 mg | ORAL_TABLET | Freq: Three times a day (TID) | ORAL | 5 refills | Status: DC | PRN

## 2021-06-18 MED ORDER — DEXLANSOPRAZOLE 30 MG PO CPDR: 30.0000 mg | DELAYED_RELEASE_CAPSULE | Freq: Every day | ORAL | 3 refills | Status: DC

## 2021-06-18 NOTE — Progress Notes (Signed)
Jo [name] 60 y.o. 11-Oct-1960 [id number]  Assessment & Plan:   Encounter Diagnoses  Name Primary?   Irritable bowel syndrome with both constipation and diarrhea Yes   Nausea without vomiting    Systemic lupus erythematosus, unspecified SLE type, unspecified organ involvement status ([facility])    Heartburn    I think she is better than when we met earlier this year but still significantly symptomatic.  Medication plans as below she will discontinue Zegerid and try the Dexilant and we will increase the strength of ondansetron.  Additionally she will try fiber supplementation 1 to 2 tablespoons daily of Benefiber and add or replace with MiraLAX if that is ineffective.  I explained that she has an IBS mixed pattern and I think we need to promote better regular defecation and avoid the extreme swings.  She will message me via MyChart for help going forward at this time follow-up as needed. Meds ordered this encounter  Medications   Dexlansoprazole (DEXILANT) 30 MG capsule    Sig: Take 1 capsule (30 mg total) by mouth daily before breakfast.    Dispense:  90 capsule    Refill:  3   ondansetron (ZOFRAN) 8 MG tablet    Sig: Take 1 tablet (8 mg total) by mouth every 8 (eight) hours as needed for nausea or vomiting.    Dispense:  60 tablet    Refill:  5    Fill when patient asks not before    Note we did discuss the possibility of using mirtazapine for nausea it would probably stimulate appetite as well and she is concerned about weight gain she has a complicated medication regimen and is already on antianxiety/antidepressant agents.  I still think it is a viable option that may help her but would defer to other providers who are prescribing SSRIs etc.  Polypharmacy is likely at play here as well.  Difficult situation with connective tissue disease and reflex sympathetic dystrophy requiring multimodality treatment including chronic narcotics.  CC: Caren Macadam, MD [facility]   Subjective:   Chief Complaint: Nausea, IBS  HPI Patient is here for follow-up of diarrhea problems.  She was evaluated as below. Colonoscopy and terminal ileum inspection 01/30/2021 normal with random biopsies showing normal mucosa.  EGD same date gastric polyps and patchy erythema in a striped fashion in the stomach with pathology showing fundic gland polyps and nonspecific chronic inflammation  When we contacted her after this her diarrhea had stopped.  She actually was constipated.  She thought diarrhea was coming from lupus perhaps.  Follow-up was arranged for July but is now rescheduled to this date.  Wt Readings from Last 3 Encounters:  06/18/21 156 lb 2 oz (70.8 kg)  06/18/21 156 lb (70.8 kg)  06/04/21 155 lb (70.3 kg)  She was 63 kg in April   She is now reporting alternating her mixed pattern will should go for days with very tiny stools or no stools and then have multiple numerous bowel movements that are bothersome that are very loose and mushy.  Reports this starts as she will take MiraLAX for a couple or few days after days of poor defecation.  Still has a lot of nausea and is having to take 2 ondansetron in the mornings.  She does not relate the ondansetron to constipation.  Overall medication regimen is stable.  She does take narcotics chronically as well.  She sometimes uses Phenergan if Zofran fails and will rarely use  some Reglan.  She feels like she is having increased heartburn with retrosternal pyrosis symptoms but not regurgitation of fluid.  She has been using Zegerid over-the-counter.  She reports pantoprazole stopped working in the past as well.  She has been through several PPIs.  Carafate helps some but it constipates her.  Suffers with insomnia much of the time.  About the start azathioprine for her lupus. Allergies  Allergen Reactions   Bee Pollen Anaphylaxis   Diazepam     sucidal depression   Prednisone Nausea And  Vomiting    Per pt can not take oral steroids    Amoxicillin     Severe diarrhea At high dose    Doxycycline     Pt does not remember reaction   Emetrol     Other reaction(s): GI upset   Erythromycin     n & v   Gabapentin     Doesn't remember; didn't tolerate   Methotrexate Derivatives     diarrhea   Morphine     n & v   Propoxyphene N-Acetaminophen     n & V   Sulfa Antibiotics     MIGRAINES   Tetracyclines & Related     N & v   Propoxyphene Other (See Comments)   Current Meds  Medication Sig   azelastine (ASTELIN) 0.1 % nasal spray Place 1 spray into both nostrils 2 (two) times daily. Use in each nostril as directed   B-D 3CC LUER-LOK SYR 23GX1" 23G X 1" 3 ML MISC See admin instructions.   Belimumab (BENLYSTA) 200 MG/ML SOSY once a week.   busPIRone (BUSPAR) 15 MG tablet TAKE 1/2 TABLET BY MOUTH EVERY MORNING and TAKE 1/2 TABLET BY MOUTH EVERYDAY AT BEDTIME   cholecalciferol (VITAMIN D) 1000 UNITS tablet Take 2,000 Units by mouth daily.   clindamycin (CLEOCIN T) 1 % lotion Apply 1 application topically 2 (two) times daily as needed.   Cranberry 200 MG CAPS Take 200 mg by mouth 2 (two) times daily.   Dexlansoprazole (DEXILANT) 30 MG capsule Take 1 capsule (30 mg total) by mouth daily before breakfast.   erythromycin with ethanol (EMGEL) 2 % gel Apply topically 2 (two) times daily. (Patient taking differently: Apply topically 2 (two) times daily as needed.)   estradiol (ESTRACE) 2 MG tablet Take 1 tablet (2 mg total) by mouth daily.   fluticasone (CUTIVATE) 0.05 % cream Apply 1 application topically 2 (two) times daily as needed (rash).    fluticasone (FLONASE) 50 MCG/ACT nasal spray Place 2 sprays into both nostrils daily.   folic acid (FOLVITE) 1 MG tablet Take 1 tablet (1 mg total) by mouth daily. (Patient taking differently: Take 1 mg by mouth in the morning and at bedtime.)   HYDROmorphone (DILAUDID) 4 MG tablet Take by mouth as needed for severe pain.   ketorolac  (TORADOL) 30 MG/ML injection Inject 1 mL (30 mg total) into the muscle daily as needed for moderate pain.   lamoTRIgine (LAMICTAL) 100 MG tablet TAKE ONE TABLET BY MOUTH AT BREAKFAST AND AT BEDTIME   levothyroxine (SYNTHROID) 125 MCG tablet TAKE ONE TABLET BY MOUTH EVERY MORNING   Loratadine (CLARITIN PO) Take 1 mg by mouth at bedtime.   LYSINE PO Take 1 tablet by mouth daily.   medroxyPROGESTERone (PROVERA) 2.5 MG tablet TAKE 1 TABLET ALTERNATING WITH 2 TABLETS DAILY   methadone (DOLOPHINE) 5 MG tablet Take 1 tablet by mouth 2 (two) times daily.   metoCLOPramide (REGLAN) 10 MG tablet  Take 1 tablet (10 mg total) by mouth every 6 (six) hours as needed for nausea.   ondansetron (ZOFRAN) 8 MG tablet Take 1 tablet (8 mg total) by mouth every 8 (eight) hours as needed for nausea or vomiting.   promethazine (PHENERGAN) 25 MG tablet Take 1 tablet (25 mg total) by mouth every 8 (eight) hours as needed for up to 20 doses for nausea or vomiting. Take one tablet every 8- 12 hours as needed.   sertraline (ZOLOFT) 100 MG tablet TAKE TWO TABLETS BY MOUTH EVERY MORNING   Syringe/Needle, Disp, (SYRINGE 3CC/25GX1-1/2") 25G X 1-1/2" 3 ML MISC Inject 1cc (108mcg) B12 q 2-4 weeks as directed   tiZANidine (ZANAFLEX) 4 MG capsule Take 3 capsules (12 mg total) by mouth at bedtime as needed for muscle spasms.   tiZANidine (ZANAFLEX) 4 MG tablet Take 12 mg by mouth at bedtime.   topiramate (TOPAMAX) 25 MG tablet Take by mouth.   triamcinolone cream (KENALOG) 0.1 % daily as needed.   [DISCONTINUED] Omeprazole-Sodium Bicarbonate (ZEGERID) 20-1100 MG CAPS capsule Take 1 capsule by mouth daily.   [DISCONTINUED] ondansetron (ZOFRAN) 4 MG tablet Take 1 tablet (4 mg total) by mouth every 8 (eight) hours as needed for nausea.   Current Facility-Administered Medications for the 06/18/21 encounter (Office Visit) with Gatha Mayer, MD  Medication   cyanocobalamin ((VITAMIN B-12)) injection 1,000 mcg   Past Medical History:   Diagnosis Date   Allergy    Anemia    Anxiety    Anxiety and depression    Arrhythmia 2021   Arthritis    Asthma    Cervical cancer [facility])    age 21    Chronic headaches    Colon polyp    COPD (chronic obstructive pulmonary disease) (HCC)    no o2   Depression    Endometriosis    Fibromyalgia    Fundic gland polyps of stomach, benign    Gastritis    GERD (gastroesophageal reflux disease)    H. pylori infection 2013   H/O gastric ulcer 1985   History of anorexia nervosa    History of bulimia    History of pneumonia    Hypothyroidism    Irritable bowel syndrome (IBS)    Obesity    Personal history of other mental and behavioral disorders 01/14/2008   Qualifier: Diagnosis of  By: Carlean Purl MD, [name]   Overview:  Overview:  Annotation: BULIMIA Qualifier: Diagnosis of  By: Bertram Gala    REFLEX SYMPATHETIC DYSTROPHY 02/26/2009   Qualifier: Diagnosis of  By: Carlean Purl MD, Tonna Boehringer E    Rheumatoid arteritis ([place])    RSD (reflex sympathetic dystrophy)    Sleep apnea    Systemic lupus ([place]) 2021   Ulcer    gastric, duodenal ulcer   Past Surgical History:  Procedure Laterality Date   CERVICAL SPINE SURGERY  1990   CERVIX SURGERY     CHOLECYSTECTOMY     COLONOSCOPY     KNEE ARTHROSCOPY  04/21/2012   right   LAPAROSCOPIC NISSEN FUNDOPLICATION  77/41/2878   Dr. Johnathan Hausen   laparoscopy     for endometriosis   lumbar spondylosis injected  2013   Dr. Mina Marble   NASAL SINUS SURGERY     THORACOSCOPY W/ THORACIC SYMPATHECTOMY     for RSD   TONSILLECTOMY     UPPER GASTROINTESTINAL ENDOSCOPY     Social History   Social History Narrative   Lives at home alone.  Caffeine use: Drinks coffee- 1 cup in morning   Ginger ale   family history includes Breast cancer in her maternal aunt; Colon polyps in her brother, mother, and sister; Irritable bowel syndrome in her mother; Kidney Stones in her brother; Lung cancer in her maternal grandfather; Other in her mother;  Raynaud syndrome in her mother; Stroke in her mother.   Review of Systems As per HPI  Objective:   Physical Exam BP 100/62   Pulse 73   Ht $R'5\' 4"'Oj$  (1.626 m)   Wt 156 lb 2 oz (70.8 kg)   BMI 26.80 kg/m  32 minutes total time spent on this patient visit

## 2021-06-18 NOTE — Assessment & Plan Note (Signed)
History of atypical chest pain with a coronary calcium score performed 05/08/2019 that was 0.
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
## 2021-06-18 NOTE — Patient Instructions (Signed)
For the bowels:  1) Take Benefiber 1-2 tablespoons daily 2) If this is not helpful add MiraLx every day. Try 1/2 dose and adjust up or down. 3) Goal is to try to have more regular soft or formed stools and not the small hard ones or mushy loose ones that are too frequent.  Changing Zegerid to [place].  Increased dose of Zofran  Message me if needed  I appreciate the opportunity to care for you.  ,Gatha Mayer, MD, [facility]

## 2021-06-18 NOTE — Assessment & Plan Note (Signed)
History of palpitations with event monitor performed 03/02/2021 revealing frequent PACs and short runs of PSVT.  I apparently had started her on a drug in the past which apparently did not work but she is not sure what that was.  I am going to refer her to EP for further evaluation of her palpitations.  She did have a 2D echo performed 04/02/2021 that was normal.

## 2021-06-18 NOTE — Patient Instructions (Signed)
Medication Instructions:  Your physician recommends that you continue on your current medications as directed. Please refer to the Current Medication list given to you today.  *If you need a refill on your cardiac medications before your next appointment, please call your pharmacy*   Follow-Up: At [facility], you and your health needs are our priority.  As part of our continuing mission to provide you with exceptional heart care, we have created designated Provider Care Teams.  These Care Teams include your primary Cardiologist (physician) and Advanced Practice Providers (APPs -  Physician Assistants and Nurse Practitioners) who all work together to provide you with the care you need, when you need it.  We recommend signing up for the patient portal called "MyChart".  Sign up information is provided on this After Visit Summary.  MyChart is used to connect with patients for Virtual Visits (Telemedicine).  Patients are able to view lab/test results, encounter notes, upcoming appointments, etc.  Non-urgent messages can be sent to your provider as well.   To learn more about what you can do with MyChart, go to [email or web address].    Your next appointment:   6 month(s)  The format for your next appointment:   In Person  Provider:   You will see one of the following Advanced Practice Providers on your designated Care Team:    -Almyra Deforest, PA-C  Then, Quay Burow, MD will plan to see you again in 12 month(s).

## 2021-06-18 NOTE — Chronic Care Management (AMB) (Signed)
Chronic Care Management Pharmacy Assistant   Name: Jo [name]  MRN: [id number] DOB: 07/16/1961  Reason for Encounter: Reschedule Appointment with [name] as she will be at a different office on Thursdays per Thurmond Butts PTM.  Call to patient to advise of the above, cancelled appointment for 12-15 and offered new date of 1-17 she agreed.     Medications: Outpatient Encounter Medications as of 06/18/2021  Medication Sig   azelastine (ASTELIN) 0.1 % nasal spray Place 1 spray into both nostrils 2 (two) times daily. Use in each nostril as directed   Belimumab (BENLYSTA) 200 MG/ML SOSY once a week.   busPIRone (BUSPAR) 15 MG tablet TAKE 1/2 TABLET BY MOUTH EVERY MORNING and TAKE 1/2 TABLET BY MOUTH EVERYDAY AT BEDTIME   cholecalciferol (VITAMIN D) 1000 UNITS tablet Take 2,000 Units by mouth daily.   clindamycin (CLEOCIN T) 1 % lotion Apply 1 application topically 2 (two) times daily as needed.   Cranberry 200 MG CAPS Take 200 mg by mouth 2 (two) times daily.   cyanocobalamin (,VITAMIN B-12,) 1000 MCG/ML injection INJECT ONE ML INTRAMUSCULARLY FOR ONE DOSE ALTERNATING EVERY TWO WEEKS, THEN MONHTLY   erythromycin with ethanol (EMGEL) 2 % gel Apply topically 2 (two) times daily. (Patient taking differently: Apply topically 2 (two) times daily as needed.)   estradiol (ESTRACE) 2 MG tablet Take 1 tablet (2 mg total) by mouth daily.   fexofenadine-pseudoephedrine (ALLEGRA-D 24) 180-240 MG 24 hr tablet Take 1 tablet by mouth at bedtime.   fluticasone (CUTIVATE) 0.05 % cream Apply 1 application topically 2 (two) times daily as needed (rash).    fluticasone (FLONASE) 50 MCG/ACT nasal spray Place 2 sprays into both nostrils daily.   folic acid (FOLVITE) 1 MG tablet Take 1 tablet (1 mg total) by mouth daily. (Patient taking differently: Take 1 mg by mouth in the morning and at bedtime.)   HYDROmorphone (DILAUDID) 4 MG tablet Take by mouth every 4 (four) hours as needed for severe pain.    ketorolac (TORADOL) 30 MG/ML injection Inject 1 mL (30 mg total) into the muscle daily as needed for moderate pain.   lamoTRIgine (LAMICTAL) 100 MG tablet TAKE ONE TABLET BY MOUTH AT BREAKFAST AND AT BEDTIME   levothyroxine (SYNTHROID) 125 MCG tablet TAKE ONE TABLET BY MOUTH EVERY MORNING   LYSINE PO Take 1 tablet by mouth daily.   medroxyPROGESTERone (PROVERA) 2.5 MG tablet TAKE 1 TABLET ALTERNATING WITH 2 TABLETS DAILY   methadone (DOLOPHINE) 5 MG tablet Take 1 tablet by mouth 2 (two) times daily.   metoCLOPramide (REGLAN) 10 MG tablet Take 1 tablet (10 mg total) by mouth every 6 (six) hours as needed for nausea.   Omeprazole-Sodium Bicarbonate (ZEGERID) 20-1100 MG CAPS capsule Take 1 capsule by mouth daily.   ondansetron (ZOFRAN) 4 MG tablet Take 1 tablet (4 mg total) by mouth every 8 (eight) hours as needed for nausea.   promethazine (PHENERGAN) 25 MG tablet Take 1 tablet (25 mg total) by mouth every 8 (eight) hours as needed for up to 20 doses for nausea or vomiting. Take one tablet every 8- 12 hours as needed.   sertraline (ZOLOFT) 100 MG tablet TAKE TWO TABLETS BY MOUTH EVERY MORNING   sucralfate (CARAFATE) 1 g tablet Take 1 tablet (1 g total) by mouth 3 (three) times daily as needed for up to 14 days.   Syringe/Needle, Disp, (SYRINGE 3CC/25GX1-1/2") 25G X 1-1/2" 3 ML MISC Inject 1cc (1061mcg) B12 q 2-4 weeks as directed  tiZANidine (ZANAFLEX) 4 MG capsule Take 3 capsules (12 mg total) by mouth at bedtime as needed for muscle spasms.   triamcinolone cream (KENALOG) 0.1 % daily as needed.   Facility-Administered Encounter Medications as of 06/18/2021  Medication   cyanocobalamin ((VITAMIN B-12)) injection 1,000 mcg    Care Gaps: HIV Screening - Overdue TDAP - Overdue Mammogram - Overdue Zoster Vaccine - Overdue AWV- 10-09-20 CCM 10-06-21  Star Rating Drugs: None  Ned Clines [place] Clinical Pharmacist Assistant [phone]

## 2021-06-19 ENCOUNTER — Telehealth: Payer: Self-pay | Admitting: Internal Medicine

## 2021-06-19 MED ORDER — LANSOPRAZOLE 30 MG PO CPDR: 30.0000 mg | DELAYED_RELEASE_CAPSULE | Freq: Two times a day (BID) | ORAL | 3 refills | Status: DC

## 2021-06-19 NOTE — Telephone Encounter (Signed)
Meds ordered this encounter  Medications   lansoprazole (PREVACID) 30 MG capsule    Sig: Take 1 capsule (30 mg total) by mouth 2 (two) times daily before a meal.    Dispense:  180 capsule    Refill:  3

## 2021-06-19 NOTE — Telephone Encounter (Signed)
Inbound call from pt stating that the medication Dexlansoprazole is too expensive. Pt wanted to see if Dr. Carlean Purl can call in a prescription that's less expensive. Please advise. Thank you.

## 2021-06-19 NOTE — Telephone Encounter (Signed)
Jo [name] informed and said she will try the lansoprazole 30mg  capsules BID , taking it on an empty stomach to make sure and get the full benefit of it. She will call back if it doesn't work.

## 2021-06-20 ENCOUNTER — Telehealth: Payer: PPO | Admitting: Family Medicine

## 2021-06-21 HISTORY — PX: LOOP RECORDER INSERTION: EP1214

## 2021-06-23 ENCOUNTER — Telehealth: Payer: Self-pay | Admitting: Internal Medicine

## 2021-06-23 NOTE — Telephone Encounter (Signed)
I called and told Jo [name] that in the office note from 06/18/21 Dr Carlean Purl wants her to talk with the providers that do her antianxiety/anti depression medicines to discuss changing to mirtazapine.

## 2021-06-23 NOTE — Telephone Encounter (Signed)
Inbound call from patient. States she was to have Mirtazapine sent to her pharmacy to help at night with her nausea but it was never sent.

## 2021-06-25 NOTE — Progress Notes (Signed)
Electrophysiology Office Note:    Date:  06/26/2021   ID:  Jo [name], DOB 11/06/1960, MRN [id number]  PCP:  Caren Macadam, MD  [facility] Cardiologist:  Quay Burow, MD  [facility] Electrophysiologist:  Vickie Epley, MD   Referring MD: Lorretta Harp, MD   Chief Complaint: SVT  History of Present Illness:    Jo [name] is a 60 y.o. female who presents for an evaluation of SVT at the request of Dr. Gwenlyn Found. Their medical history includes anxiety, COPD, fibromyalgia, hypothyroidism, lupus.  The patient last saw Dr. Gwenlyn Found June 18, 2021.  She reported occasional palpitations.  She also reported a history of syncope.  She reported 4-5 episodes of syncope in the last year.  There is no prodrome prior to the syncopal episodes.  She has had personal injury. A heart monitor from June 2022 there were short episodes of SVT. Patient has been her no clear triggers for her passing out spells.  She is typically unconscious for just a few seconds at a time.  She has had multiple episodes with personal injury.  She has no prodrome. She tells me that when she had the heart monitor on, she did not feel as bad as she has in the past.   Past Medical History:  Diagnosis Date   Allergy    Anemia    Anxiety    Anxiety and depression    Arrhythmia 2021   Arthritis    Asthma    Cervical cancer (HCC)    age 66    Chronic headaches    Colon polyp    COPD (chronic obstructive pulmonary disease) (HCC)    no o2   Depression    Endometriosis    Fibromyalgia    Fundic gland polyps of stomach, benign    Gastritis    GERD (gastroesophageal reflux disease)    H. pylori infection 2013   H/O gastric ulcer 1985   History of anorexia nervosa    History of bulimia    History of pneumonia    Hypothyroidism    Irritable bowel syndrome (IBS)    Obesity    Personal history of other mental and behavioral disorders 01/14/2008   Qualifier: Diagnosis of  By: Carlean Purl MD, [name]   Overview:  Overview:  Annotation: BULIMIA Qualifier: Diagnosis of  By: Bertram Gala    REFLEX SYMPATHETIC DYSTROPHY 02/26/2009   Qualifier: Diagnosis of  By: Carlean Purl MD, Tonna Boehringer E    Rheumatoid arteritis ([place])    RSD (reflex sympathetic dystrophy)    Sleep apnea    Systemic lupus ([place]) 2021   Ulcer    gastric, duodenal ulcer    Past Surgical History:  Procedure Laterality Date   CERVICAL SPINE SURGERY  1990   CERVIX SURGERY     CHOLECYSTECTOMY     COLONOSCOPY     KNEE ARTHROSCOPY  04/21/2012   right   LAPAROSCOPIC NISSEN FUNDOPLICATION  45/36/4680   Dr. Johnathan Hausen   laparoscopy     for endometriosis   lumbar spondylosis injected  2013   Dr. Mina Marble   NASAL SINUS SURGERY     THORACOSCOPY W/ THORACIC SYMPATHECTOMY     for RSD   TONSILLECTOMY     UPPER GASTROINTESTINAL ENDOSCOPY      Current Medications: Current Meds  Medication Sig   azaTHIOprine (IMURAN) 50 MG tablet Take 50 mg by mouth daily.   azelastine (ASTELIN) 0.1 % nasal spray Place  1 spray into both nostrils 2 (two) times daily. Use in each nostril as directed   B-D 3CC LUER-LOK SYR 23GX1" 23G X 1" 3 ML MISC See admin instructions.   Belimumab (BENLYSTA) 200 MG/ML SOSY once a week.   busPIRone (BUSPAR) 15 MG tablet TAKE 1/2 TABLET BY MOUTH EVERY MORNING and TAKE 1/2 TABLET BY MOUTH EVERYDAY AT BEDTIME   cholecalciferol (VITAMIN D) 1000 UNITS tablet Take 2,000 Units by mouth daily.   clindamycin (CLEOCIN T) 1 % lotion Apply 1 application topically 2 (two) times daily as needed.   Cranberry 200 MG CAPS Take 200 mg by mouth 2 (two) times daily.   erythromycin with ethanol (EMGEL) 2 % gel Apply topically 2 (two) times daily. (Patient taking differently: Apply topically 2 (two) times daily as needed.)   estradiol (ESTRACE) 2 MG tablet Take 1 tablet (2 mg total) by mouth daily.   fluticasone (CUTIVATE) 0.05 % cream Apply 1 application topically 2 (two) times daily as needed (rash).    fluticasone  (FLONASE) 50 MCG/ACT nasal spray Place 2 sprays into both nostrils daily.   folic acid (FOLVITE) 1 MG tablet Take 1 tablet (1 mg total) by mouth daily. (Patient taking differently: Take 1 mg by mouth in the morning and at bedtime.)   HYDROmorphone (DILAUDID) 4 MG tablet Take by mouth as needed for severe pain.   ketorolac (TORADOL) 30 MG/ML injection Inject 1 mL (30 mg total) into the muscle daily as needed for moderate pain.   lamoTRIgine (LAMICTAL) 100 MG tablet TAKE ONE TABLET BY MOUTH AT BREAKFAST AND AT BEDTIME   lansoprazole (PREVACID) 30 MG capsule Take 1 capsule (30 mg total) by mouth 2 (two) times daily before a meal.   levothyroxine (SYNTHROID) 125 MCG tablet TAKE ONE TABLET BY MOUTH EVERY MORNING   Loratadine (CLARITIN PO) Take 1 mg by mouth at bedtime.   LYSINE PO Take 1 tablet by mouth daily.   medroxyPROGESTERone (PROVERA) 2.5 MG tablet TAKE 1 TABLET ALTERNATING WITH 2 TABLETS DAILY   methadone (DOLOPHINE) 5 MG tablet Take 1 tablet by mouth 2 (two) times daily.   metoCLOPramide (REGLAN) 10 MG tablet Take 1 tablet (10 mg total) by mouth every 6 (six) hours as needed for nausea.   ondansetron (ZOFRAN) 8 MG tablet Take 1 tablet (8 mg total) by mouth every 8 (eight) hours as needed for nausea or vomiting.   promethazine (PHENERGAN) 25 MG tablet Take 1 tablet (25 mg total) by mouth every 8 (eight) hours as needed for up to 20 doses for nausea or vomiting. Take one tablet every 8- 12 hours as needed.   sertraline (ZOLOFT) 100 MG tablet TAKE TWO TABLETS BY MOUTH EVERY MORNING   Syringe/Needle, Disp, (SYRINGE 3CC/25GX1-1/2") 25G X 1-1/2" 3 ML MISC Inject 1cc (1018mcg) B12 q 2-4 weeks as directed   tiZANidine (ZANAFLEX) 4 MG capsule Take 3 capsules (12 mg total) by mouth at bedtime as needed for muscle spasms.   tiZANidine (ZANAFLEX) 4 MG tablet Take 12 mg by mouth at bedtime.   topiramate (TOPAMAX) 25 MG tablet Take by mouth.   triamcinolone cream (KENALOG) 0.1 % daily as needed.   Current  Facility-Administered Medications for the 06/26/21 encounter (Office Visit) with Vickie Epley, MD  Medication   cyanocobalamin ((VITAMIN B-12)) injection 1,000 mcg     Allergies:   Bee pollen, Diazepam, Prednisone, Amoxicillin, Doxycycline, Emetrol, Erythromycin, Gabapentin, Methotrexate derivatives, Morphine, Propoxyphene n-acetaminophen, Sulfa antibiotics, Tetracyclines & related, and Propoxyphene   Social History  Socioeconomic History   Marital status: Divorced    Spouse name: Not on file   Number of children: 0   Years of education: 79   Highest education level: Not on file  Occupational History   Occupation: flight attendant-disabled    Employer: UNEMPLOYED  Tobacco Use   Smoking status: Never   Smokeless tobacco: Never  Vaping Use   Vaping Use: Never used  Substance and Sexual Activity   Alcohol use: No   Drug use: No   Sexual activity: Not Currently  Other Topics Concern   Not on file  Social History Narrative   Lives at home alone.   Caffeine use: Drinks coffee- 1 cup in morning   Ginger ale   Social Determinants of Health   Financial Resource Strain: Low Risk    Difficulty of Paying Living Expenses: Not hard at all  Food Insecurity: No Food Insecurity   Worried About Charity fundraiser in the Last Year: Never true   Arboriculturist in the Last Year: Never true  Transportation Needs: No Transportation Needs   Lack of Transportation (Medical): No   Lack of Transportation (Non-Medical): No  Physical Activity: Sufficiently Active   Days of Exercise per Week: 6 days   Minutes of Exercise per Session: 30 min  Stress: No Stress Concern Present   Feeling of Stress : Not at all  Social Connections: Socially Isolated   Frequency of Communication with Friends and Family: More than three times a week   Frequency of Social Gatherings with Friends and Family: Once a week   Attends Religious Services: Never   Marine scientist or Organizations: No    Attends Music therapist: Never   Marital Status: Divorced     Family History: The patient's family history includes Breast cancer in her maternal aunt; Colon polyps in her brother, mother, and sister; Irritable bowel syndrome in her mother; Kidney Stones in her brother; Lung cancer in her maternal grandfather; Other in her mother; Raynaud syndrome in her mother; Stroke in her mother. There is no history of Colon cancer, Esophageal cancer, Rectal cancer, Stomach cancer, Migraines, or Seizures.  ROS:   Please see the history of present illness.    All other systems reviewed and are negative.  EKGs/Labs/Other Studies Reviewed:    The following studies were reviewed today:  April 02, 2021 echo Left ventricular function normal, 55% Right ventricular function normal No significant valvular abnormalities  February 28, 2021 ZIO personally reviewed Frequent PACs and short runs of SVT. 10 to 15% burden of PACs Rare ventricular ectopy 863 SVT episodes, longest lasting 18 beats with a rate of 135 bpm.     Recent Labs: 03/11/2021: TSH 1.260 04/28/2021: ALT 8; BUN 14; Creatinine 1.08; Hemoglobin 12.7; Platelet Count 241; Potassium 4.3; Sodium 142  Recent Lipid Panel    Component Value Date/Time   CHOL 175 11/15/2019 0949   TRIG 130.0 11/15/2019 0949   HDL 75.00 11/15/2019 0949   CHOLHDL 2 11/15/2019 0949   VLDL 26.0 11/15/2019 0949   LDLCALC 74 11/15/2019 0949    Physical Exam:    VS:  BP 112/72   Pulse 71   Ht 5\' 4"  (1.626 m)   Wt 157 lb (71.2 kg)   SpO2 97%   BMI 26.95 kg/m     Wt Readings from Last 3 Encounters:  06/26/21 157 lb (71.2 kg)  06/18/21 156 lb 2 oz (70.8 kg)  06/18/21 156 lb (70.8 kg)  GEN:  Well nourished, well developed in no acute distress HEENT: Normal NECK: No JVD; No carotid bruits LYMPHATICS: No lymphadenopathy CARDIAC: RRR, no murmurs, rubs, gallops RESPIRATORY:  Clear to auscultation without rales, wheezing or rhonchi  ABDOMEN: Soft,  non-tender, non-distended MUSCULOSKELETAL:  No edema; No deformity  SKIN: Warm and dry NEUROLOGIC:  Alert and oriented x 3 PSYCHIATRIC:  Normal affect   ASSESSMENT:    1. Syncope and collapse   2. Palpitations   3. Fibromyalgia    PLAN:    In order of problems listed above:  #Syncope Multiple episodes.  Some with personal injury.  Has worn a heart monitor in the past that was unrevealing.  I discussed using a loop recorder for ongoing surveillance of the heart rhythm to see if there is any link between these episodes and an arrhythmia.  I discussed the loop recorder implant procedure in detail with the patient and she wishes to proceed.  #Palpitations is likely Her ZIO monitor showed frequent PACs.  She tells me that her symptoms of palpitations are different than anything she experienced while wearing the heart monitor.  Loop recorder as above.  #Regional pain/fibromyalgia   Total time spent with patient today 50 minutes. This includes reviewing records, evaluating the patient and coordinating care.  Medication Adjustments/Labs and Tests Ordered: Current medicines are reviewed at length with the patient today.  Concerns regarding medicines are outlined above.  No orders of the defined types were placed in this encounter.  No orders of the defined types were placed in this encounter.    Signed, Hilton Cork. Quentin Ore, MD, [facility], [facility] 06/26/2021 1:57 PM    Electrophysiology [facility]   -------------------------------------------------------------------------------   SURGEON:  Lars Mage, MD    PREPROCEDURE DIAGNOSIS:  Syncope    POSTPROCEDURE DIAGNOSIS:  Syncope     PROCEDURES:   1. Implantable loop recorder implantation    INTRODUCTION:  Jo [name] is a 60 y.o. patient with syncope who presents today for implantable loop implantation.      DESCRIPTION OF PROCEDURE:  Informed written consent was obtained.  The patient required no sedation  for the procedure today.   The patients left chest was therefore prepped and draped in the usual sterile fashion. The skin overlying the left parasternal region was infiltrated with lidocaine for local analgesia.  A 0.5-cm incision was made over the left parasternal region over the 3rd intercostal space.  A Medtronic Reveal Linq model [id number]) implantable loop recorder was then placed into the pocket  R waves were very prominent and measured >0.76mV.  Steri- Strips and a sterile dressing were then applied.  There were no early apparent complications.     CONCLUSIONS:   1. Successful implantation of a Medtronic Reveal LINQ implantable loop recorder for syncope.  2. No early apparent complications.   Lysbeth Galas T. Quentin Ore, MD, [facility] Cardiac Electrophysiology

## 2021-06-26 ENCOUNTER — Telehealth: Payer: Self-pay | Admitting: Pharmacist

## 2021-06-26 ENCOUNTER — Other Ambulatory Visit: Payer: Self-pay

## 2021-06-26 ENCOUNTER — Ambulatory Visit: Payer: PPO | Admitting: Cardiology

## 2021-06-26 VITALS — BP 112/72 | HR 71 | Ht 64.0 in | Wt 157.0 lb

## 2021-06-26 DIAGNOSIS — R55 Syncope and collapse: Secondary | ICD-10-CM

## 2021-06-26 DIAGNOSIS — M797 Fibromyalgia: Secondary | ICD-10-CM

## 2021-06-26 DIAGNOSIS — R002 Palpitations: Secondary | ICD-10-CM | POA: Diagnosis not present

## 2021-06-26 DIAGNOSIS — I471 Supraventricular tachycardia: Secondary | ICD-10-CM

## 2021-06-26 NOTE — Patient Instructions (Addendum)
Medication Instructions:  Your physician recommends that you continue on your current medications as directed. Please refer to the Current Medication list given to you today.  Labwork: None ordered.  Testing/Procedures: None ordered.  Follow-Up:  Your physician wants you to follow-up as needed with Dr. Quentin Ore   Implantable Loop Recorder Placement, Care After This sheet gives you information about how to care for yourself after your procedure. Your health care provider may also give you more specific instructions. If you have problems or questions, contact your health care provider. What can I expect after the procedure? After the procedure, it is common to have: Soreness or discomfort near the incision. Some swelling or bruising near the incision.  Follow these instructions at home: Incision care   Leave your outer dressing on for 72 hours.  After 72 hours you can remove your outer dressing and shower. Leave adhesive strips in place. These skin closures may need to stay in place for 1-2 weeks. If adhesive strip edges start to loosen and curl up, you may trim the loose edges.  You may remove the strips if they have not fallen off after 2 weeks. Check your incision area every day for signs of infection. Check for: Redness, swelling, or pain. Fluid or blood. Warmth. Pus or a bad smell. Do not take baths, swim, or use a hot tub until your incision is completely healed. If your wound site starts to bleed apply pressure.      If you have any questions/concerns please call the device clinic at [phone].  Activity  Return to your normal activities.  General instructions Follow instructions from your health care provider about how to manage your implantable loop recorder and transmit the information. Learn how to activate a recording if this is necessary for your type of device. Take over-the-counter and prescription medicines only as told by your health care provider. Keep all  follow-up visits as told by your health care provider. This is important. Contact a health care provider if: You have redness, swelling, or pain around your incision. You have a fever. You have pain that is not relieved by your pain medicine. You have triggered your device because of fainting (syncope) or because of a heartbeat that feels like it is racing, slow, fluttering, or skipping (palpitations). Get help right away if you have: Chest pain. Difficulty breathing. Summary After the procedure, it is common to have soreness or discomfort near the incision. Change your dressing as told by your health care provider. Follow instructions from your health care provider about how to manage your implantable loop recorder and transmit the information. Keep all follow-up visits as told by your health care provider. This is important. This information is not intended to replace advice given to you by your health care provider. Make sure you discuss any questions you have with your health care provider. Document Released: 08/19/2015 Document Revised: 10/23/2017 Document Reviewed: 10/23/2017 Elsevier Patient Education  2020 [employer].

## 2021-06-26 NOTE — Chronic Care Management (AMB) (Signed)
Chronic Care Management Pharmacy Assistant   Name: Jo [name]  MRN: [id number] DOB: 10/02/1960  Reason for Encounter: General Assessment Call    Recent office visits:  None  Recent consult visits:  06-26-2021 Vickie Epley, MD (Cardiology)- Patient presented for Syncope and collapse and other concerns. No medication changes.  06-18-2021 Gatha Mayer, MD [facility]ey) - Patient presented for IBS and other concerns. Prescribed Dexlansoprazole .Changed Ketorolac to 30 mg IM PRN daily. Increased Onasteron to 8 mg every 8 hours PRN.  06-18-2021 Lorretta Harp, MD (Cardiology) - Patient presented for palpitations and other concerns. Changed B-12 injections to every 2 weeks.  Hospital visits:  Medication Reconciliation was completed by comparing discharge summary, patient's EMR and Pharmacy list, and upon discussion with patient.  Patient presented to [facility] ED on 01-07-2021 due to Generalized abdominal pain. She was there for 6 hours.   New?Medications Started at [facility] Discharge:?? -started  promethazine 25 MG tablet Take 1 tablet (25 mg total) by mouth every 8 (eight) hours as needed for up to 15 doses for nausea or vomiting.  Medication Changes at Hospital Discharge: -Changed   sucralfate 1 g tablet Take 1 tablet (1 g total) by mouth 4 (four) times daily - with meals and at bedtime for 14 days.  Medications Discontinued at Hospital Discharge: -Stopped  None  Medications that remain the same after Hospital Discharge:??  -All other medications will remain the same.    Medications: Outpatient Encounter Medications as of 06/26/2021  Medication Sig   azaTHIOprine (IMURAN) 50 MG tablet Take 50 mg by mouth daily.   azelastine (ASTELIN) 0.1 % nasal spray Place 1 spray into both nostrils 2 (two) times daily. Use in each nostril as directed   B-D 3CC LUER-LOK SYR 23GX1" 23G X 1" 3 ML MISC See admin instructions.   Belimumab (BENLYSTA) 200 MG/ML SOSY  once a week.   busPIRone (BUSPAR) 15 MG tablet TAKE 1/2 TABLET BY MOUTH EVERY MORNING and TAKE 1/2 TABLET BY MOUTH EVERYDAY AT BEDTIME   cholecalciferol (VITAMIN D) 1000 UNITS tablet Take 2,000 Units by mouth daily.   clindamycin (CLEOCIN T) 1 % lotion Apply 1 application topically 2 (two) times daily as needed.   Cranberry 200 MG CAPS Take 200 mg by mouth 2 (two) times daily.   erythromycin with ethanol (EMGEL) 2 % gel Apply topically 2 (two) times daily. (Patient taking differently: Apply topically 2 (two) times daily as needed.)   estradiol (ESTRACE) 2 MG tablet Take 1 tablet (2 mg total) by mouth daily.   fluticasone (CUTIVATE) 0.05 % cream Apply 1 application topically 2 (two) times daily as needed (rash).    fluticasone (FLONASE) 50 MCG/ACT nasal spray Place 2 sprays into both nostrils daily.   folic acid (FOLVITE) 1 MG tablet Take 1 tablet (1 mg total) by mouth daily. (Patient taking differently: Take 1 mg by mouth in the morning and at bedtime.)   HYDROmorphone (DILAUDID) 4 MG tablet Take by mouth as needed for severe pain.   ketorolac (TORADOL) 30 MG/ML injection Inject 1 mL (30 mg total) into the muscle daily as needed for moderate pain.   lamoTRIgine (LAMICTAL) 100 MG tablet TAKE ONE TABLET BY MOUTH AT BREAKFAST AND AT BEDTIME   lansoprazole (PREVACID) 30 MG capsule Take 1 capsule (30 mg total) by mouth 2 (two) times daily before a meal.   levothyroxine (SYNTHROID) 125 MCG tablet TAKE ONE TABLET BY MOUTH EVERY MORNING   Loratadine (CLARITIN  PO) Take 1 mg by mouth at bedtime.   LYSINE PO Take 1 tablet by mouth daily.   medroxyPROGESTERone (PROVERA) 2.5 MG tablet TAKE 1 TABLET ALTERNATING WITH 2 TABLETS DAILY   methadone (DOLOPHINE) 5 MG tablet Take 1 tablet by mouth 2 (two) times daily.   metoCLOPramide (REGLAN) 10 MG tablet Take 1 tablet (10 mg total) by mouth every 6 (six) hours as needed for nausea.   ondansetron (ZOFRAN) 8 MG tablet Take 1 tablet (8 mg total) by mouth every 8  (eight) hours as needed for nausea or vomiting.   promethazine (PHENERGAN) 25 MG tablet Take 1 tablet (25 mg total) by mouth every 8 (eight) hours as needed for up to 20 doses for nausea or vomiting. Take one tablet every 8- 12 hours as needed.   sertraline (ZOLOFT) 100 MG tablet TAKE TWO TABLETS BY MOUTH EVERY MORNING   sucralfate (CARAFATE) 1 g tablet Take 1 tablet (1 g total) by mouth 3 (three) times daily as needed for up to 14 days. (Patient taking differently: Take 1 g by mouth as needed.)   Syringe/Needle, Disp, (SYRINGE 3CC/25GX1-1/2") 25G X 1-1/2" 3 ML MISC Inject 1cc (1062mcg) B12 q 2-4 weeks as directed   tiZANidine (ZANAFLEX) 4 MG capsule Take 3 capsules (12 mg total) by mouth at bedtime as needed for muscle spasms.   tiZANidine (ZANAFLEX) 4 MG tablet Take 12 mg by mouth at bedtime.   topiramate (TOPAMAX) 25 MG tablet Take by mouth.   triamcinolone cream (KENALOG) 0.1 % daily as needed.   Facility-Administered Encounter Medications as of 06/26/2021  Medication   cyanocobalamin ((VITAMIN B-12)) injection 1,000 mcg  Notes: Call to patient for General Assessment no dot phrases used as she is controlled on all diagnoses per last visit with Clinical Pharmacist.  She reports she is tolerating her medications well. She reports she just started taking Imurin and is happy with the medication as it has increased her appetite and given her so much energy she was able to get to the fair recently. She reports she is experiencing some mouth sores, her upper right side of her gums as well as sore throat that she notes is worse at night. She reports she is not ready to give up on his medication with the positive effect it has had and she states she was advised of the oral issues that might come with taking it. She reports she is not having any issues with the pharmacy at this time reports there was a mix up with her delivery before the last one, advised her she may give me a call if she has any issues in  the future as well as I would be checking in with her periodically, she was in agreement.  Care Gaps: HIV Screening - Overdue TDAP - Overdue Mammogram- Overdue Zoster Vaccine - Overdue CCM 09-27-21 AWV 10-10-21  Star Rating Drugs: None  Ned Clines [place] Clinical Pharmacist Assistant [phone]

## 2021-07-17 DIAGNOSIS — Z79891 Long term (current) use of opiate analgesic: Secondary | ICD-10-CM | POA: Diagnosis not present

## 2021-07-17 DIAGNOSIS — M1990 Unspecified osteoarthritis, unspecified site: Secondary | ICD-10-CM | POA: Diagnosis not present

## 2021-07-17 DIAGNOSIS — G90512 Complex regional pain syndrome I of left upper limb: Secondary | ICD-10-CM | POA: Diagnosis not present

## 2021-07-17 DIAGNOSIS — G894 Chronic pain syndrome: Secondary | ICD-10-CM | POA: Diagnosis not present

## 2021-07-17 DIAGNOSIS — M542 Cervicalgia: Secondary | ICD-10-CM | POA: Diagnosis not present

## 2021-07-24 ENCOUNTER — Institutional Professional Consult (permissible substitution): Payer: PPO | Admitting: Cardiology

## 2021-08-04 ENCOUNTER — Ambulatory Visit (INDEPENDENT_AMBULATORY_CARE_PROVIDER_SITE_OTHER): Payer: PPO

## 2021-08-04 DIAGNOSIS — R55 Syncope and collapse: Secondary | ICD-10-CM

## 2021-08-04 LAB — CUP PACEART REMOTE DEVICE CHECK
Date Time Interrogation Session: 20221113073452
Implantable Pulse Generator Implant Date: 20221006

## 2021-08-11 NOTE — Progress Notes (Signed)
Carelink Summary Report / Loop Recorder 

## 2021-08-18 DIAGNOSIS — G90512 Complex regional pain syndrome I of left upper limb: Secondary | ICD-10-CM | POA: Diagnosis not present

## 2021-08-18 DIAGNOSIS — M542 Cervicalgia: Secondary | ICD-10-CM | POA: Diagnosis not present

## 2021-08-18 DIAGNOSIS — Z79891 Long term (current) use of opiate analgesic: Secondary | ICD-10-CM | POA: Diagnosis not present

## 2021-08-18 DIAGNOSIS — G894 Chronic pain syndrome: Secondary | ICD-10-CM | POA: Diagnosis not present

## 2021-08-27 DIAGNOSIS — M542 Cervicalgia: Secondary | ICD-10-CM | POA: Diagnosis not present

## 2021-08-27 DIAGNOSIS — G894 Chronic pain syndrome: Secondary | ICD-10-CM | POA: Diagnosis not present

## 2021-08-27 DIAGNOSIS — G90512 Complex regional pain syndrome I of left upper limb: Secondary | ICD-10-CM | POA: Diagnosis not present

## 2021-08-27 DIAGNOSIS — Z79891 Long term (current) use of opiate analgesic: Secondary | ICD-10-CM | POA: Diagnosis not present

## 2021-08-28 DIAGNOSIS — M329 Systemic lupus erythematosus, unspecified: Secondary | ICD-10-CM | POA: Diagnosis not present

## 2021-08-28 DIAGNOSIS — M797 Fibromyalgia: Secondary | ICD-10-CM | POA: Diagnosis not present

## 2021-08-28 DIAGNOSIS — I7300 Raynaud's syndrome without gangrene: Secondary | ICD-10-CM | POA: Diagnosis not present

## 2021-08-28 DIAGNOSIS — G8929 Other chronic pain: Secondary | ICD-10-CM | POA: Diagnosis not present

## 2021-08-28 DIAGNOSIS — M359 Systemic involvement of connective tissue, unspecified: Secondary | ICD-10-CM | POA: Diagnosis not present

## 2021-08-28 DIAGNOSIS — M1990 Unspecified osteoarthritis, unspecified site: Secondary | ICD-10-CM | POA: Diagnosis not present

## 2021-08-28 DIAGNOSIS — G9050 Complex regional pain syndrome I, unspecified: Secondary | ICD-10-CM | POA: Diagnosis not present

## 2021-08-28 DIAGNOSIS — Z79899 Other long term (current) drug therapy: Secondary | ICD-10-CM | POA: Diagnosis not present

## 2021-08-28 DIAGNOSIS — R5383 Other fatigue: Secondary | ICD-10-CM | POA: Diagnosis not present

## 2021-08-28 DIAGNOSIS — R768 Other specified abnormal immunological findings in serum: Secondary | ICD-10-CM | POA: Diagnosis not present

## 2021-08-28 DIAGNOSIS — K219 Gastro-esophageal reflux disease without esophagitis: Secondary | ICD-10-CM | POA: Diagnosis not present

## 2021-08-28 LAB — BASIC METABOLIC PANEL
BUN: 13 (ref 4–21)
CO2: 26 — AB (ref 13–22)
Chloride: 106 (ref 99–108)
Creatinine: 1 (ref 0.5–1.1)
Glucose: 89
Potassium: 4.3 meq/L (ref 3.5–5.1)
Sodium: 139 (ref 137–147)

## 2021-08-28 LAB — CBC AND DIFFERENTIAL
HCT: 40 (ref 36–46)
Hemoglobin: 13.2 (ref 12.0–16.0)
Platelets: 272 10*3/uL (ref 150–400)
WBC: 6.2

## 2021-08-28 LAB — HEPATIC FUNCTION PANEL
ALT: 11 U/L (ref 7–35)
AST: 17 (ref 13–35)
Alkaline Phosphatase: 59 (ref 25–125)
Bilirubin, Total: 0.2

## 2021-08-28 LAB — COMPREHENSIVE METABOLIC PANEL
Albumin: 4.3 (ref 3.5–5.0)
Calcium: 8.9 (ref 8.7–10.7)

## 2021-08-29 ENCOUNTER — Telehealth: Payer: Self-pay | Admitting: Pharmacist

## 2021-08-29 NOTE — Chronic Care Management (AMB) (Signed)
Chronic Care Management Pharmacy Assistant   Name: Jo [name]  MRN: [id number] DOB: 06-May-1961  Reason for Encounter: Medication Review Medication Coordination    Recent office visits:  None  Recent consult visits:  08/04/21 Simone Curia, RN (Cardiology) - Patient presented for Syncope and collapse. No medication changes.   Hospital visits:  None in previous 6 months  Medications: Outpatient Encounter Medications as of 08/29/2021  Medication Sig   azaTHIOprine (IMURAN) 50 MG tablet Take 50 mg by mouth daily.   azelastine (ASTELIN) 0.1 % nasal spray Place 1 spray into both nostrils 2 (two) times daily. Use in each nostril as directed   B-D 3CC LUER-LOK SYR 23GX1" 23G X 1" 3 ML MISC See admin instructions.   Belimumab (BENLYSTA) 200 MG/ML SOSY once a week.   busPIRone (BUSPAR) 15 MG tablet TAKE 1/2 TABLET BY MOUTH EVERY MORNING and TAKE 1/2 TABLET BY MOUTH EVERYDAY AT BEDTIME   cholecalciferol (VITAMIN D) 1000 UNITS tablet Take 2,000 Units by mouth daily.   clindamycin (CLEOCIN T) 1 % lotion Apply 1 application topically 2 (two) times daily as needed.   Cranberry 200 MG CAPS Take 200 mg by mouth 2 (two) times daily.   erythromycin with ethanol (EMGEL) 2 % gel Apply topically 2 (two) times daily. (Patient taking differently: Apply topically 2 (two) times daily as needed.)   estradiol (ESTRACE) 2 MG tablet Take 1 tablet (2 mg total) by mouth daily.   fluticasone (CUTIVATE) 0.05 % cream Apply 1 application topically 2 (two) times daily as needed (rash).    fluticasone (FLONASE) 50 MCG/ACT nasal spray Place 2 sprays into both nostrils daily.   folic acid (FOLVITE) 1 MG tablet Take 1 tablet (1 mg total) by mouth daily. (Patient taking differently: Take 1 mg by mouth in the morning and at bedtime.)   HYDROmorphone (DILAUDID) 4 MG tablet Take by mouth as needed for severe pain.   ketorolac (TORADOL) 30 MG/ML injection Inject 1 mL (30 mg total) into the muscle daily as needed  for moderate pain.   lamoTRIgine (LAMICTAL) 100 MG tablet TAKE ONE TABLET BY MOUTH AT BREAKFAST AND AT BEDTIME   lansoprazole (PREVACID) 30 MG capsule Take 1 capsule (30 mg total) by mouth 2 (two) times daily before a meal.   levothyroxine (SYNTHROID) 125 MCG tablet TAKE ONE TABLET BY MOUTH EVERY MORNING   Loratadine (CLARITIN PO) Take 1 mg by mouth at bedtime.   LYSINE PO Take 1 tablet by mouth daily.   medroxyPROGESTERone (PROVERA) 2.5 MG tablet TAKE 1 TABLET ALTERNATING WITH 2 TABLETS DAILY   methadone (DOLOPHINE) 5 MG tablet Take 1 tablet by mouth 2 (two) times daily.   metoCLOPramide (REGLAN) 10 MG tablet Take 1 tablet (10 mg total) by mouth every 6 (six) hours as needed for nausea.   ondansetron (ZOFRAN) 8 MG tablet Take 1 tablet (8 mg total) by mouth every 8 (eight) hours as needed for nausea or vomiting.   promethazine (PHENERGAN) 25 MG tablet Take 1 tablet (25 mg total) by mouth every 8 (eight) hours as needed for up to 20 doses for nausea or vomiting. Take one tablet every 8- 12 hours as needed.   sertraline (ZOLOFT) 100 MG tablet TAKE TWO TABLETS BY MOUTH EVERY MORNING   sucralfate (CARAFATE) 1 g tablet Take 1 tablet (1 g total) by mouth 3 (three) times daily as needed for up to 14 days. (Patient taking differently: Take 1 g by mouth as needed.)   Syringe/Needle,  Disp, (SYRINGE 3CC/25GX1-1/2") 25G X 1-1/2" 3 ML MISC Inject 1cc (1055mcg) B12 q 2-4 weeks as directed   tiZANidine (ZANAFLEX) 4 MG capsule Take 3 capsules (12 mg total) by mouth at bedtime as needed for muscle spasms.   tiZANidine (ZANAFLEX) 4 MG tablet Take 12 mg by mouth at bedtime.   topiramate (TOPAMAX) 25 MG tablet Take by mouth.   triamcinolone cream (KENALOG) 0.1 % daily as needed.   Facility-Administered Encounter Medications as of 08/29/2021  Medication   cyanocobalamin ((VITAMIN B-12)) injection 1,000 mcg  Reviewed chart for medication changes ahead of medication coordination call.  No OVs, Consults, or hospital  visits since last care coordination call/Pharmacist visit.   No medication changes indicated OR if recent visit, treatment plan here.  BP Readings from Last 3 Encounters:  06/26/21 112/72  06/18/21 100/62  06/18/21 124/72    Lab Results  Component Value Date   HGBA1C 5.6 11/15/2019     Patient obtains medications through Adherence Packaging  30 Days   Last adherence delivery included: unavailable pr stated she missed her call last month for med coord.  Patient is due for next adherence delivery on: 09/11/21. Called patient and reviewed medications and coordinated delivery. Confirmed packaging for 90 DS  This delivery to include: Estradiol (Estrace) 2 mg : Take one at Breakfast Lamotrigine (Lamictal) 100 mg: Take one at Breakfast and one at Bedtime Levothyroxine (Synthroid) 125 mg : Take one at Breakfast Buspirone (Buspar) 15 mg : Take half tab at Breakfast and half at bedtime Sertraline (Zoloft) 100 mg : Take two with Breakfast Toprimate (Topomax) 25 BW:GYKZ one at Breakfast and 4 at Bedtime Tizanidine (Zanaflex) 4 mg : Take 3 at bedtime Folic Acid 1 mg: Take one at breakfast and one at bedtime  Medroxyprogesterone (Provera) 2.5 mg : Take one daily at breakfast Lansoprazole ( Prevacid) 30 mg: Take one tablet twice a day before meals    Patient needs refills for  Methadone 5 mg - Patient reports that she will be due for this on the 22nd had her dosing increased but can wait for her delivery on the 22nd.  Hydromorphone 4 mg - Patient ok to wait for delivery of packs on the 22nd  Confirmed delivery date of 09/11/21, advised patient that pharmacy will contact them the morning of delivery.   Care Gaps: Eye Exam - Overdue  Pap Smear - Overdue PNA Vaccine- Overdue COVID Booster -Overdue HIV Screening - Overdue TDAP - Overdue Mammogram- Overdue Zoster Vaccine - Overdue CCM 1/23 AWV 1/23  Star Rating Drugs: None    Ned Clines [place] Clinical Pharmacist  Assistant [phone]

## 2021-09-01 ENCOUNTER — Other Ambulatory Visit: Payer: Self-pay | Admitting: Family Medicine

## 2021-09-01 DIAGNOSIS — E039 Hypothyroidism, unspecified: Secondary | ICD-10-CM

## 2021-09-01 DIAGNOSIS — F341 Dysthymic disorder: Secondary | ICD-10-CM

## 2021-09-03 ENCOUNTER — Telehealth (INDEPENDENT_AMBULATORY_CARE_PROVIDER_SITE_OTHER): Payer: PPO | Admitting: Family Medicine

## 2021-09-03 VITALS — Temp 96.0°F

## 2021-09-03 DIAGNOSIS — K1379 Other lesions of oral mucosa: Secondary | ICD-10-CM | POA: Diagnosis not present

## 2021-09-03 DIAGNOSIS — M329 Systemic lupus erythematosus, unspecified: Secondary | ICD-10-CM

## 2021-09-03 DIAGNOSIS — E538 Deficiency of other specified B group vitamins: Secondary | ICD-10-CM | POA: Diagnosis not present

## 2021-09-03 DIAGNOSIS — R634 Abnormal weight loss: Secondary | ICD-10-CM | POA: Diagnosis not present

## 2021-09-03 DIAGNOSIS — R002 Palpitations: Secondary | ICD-10-CM | POA: Diagnosis not present

## 2021-09-03 DIAGNOSIS — Z13220 Encounter for screening for lipoid disorders: Secondary | ICD-10-CM | POA: Diagnosis not present

## 2021-09-03 DIAGNOSIS — Z8639 Personal history of other endocrine, nutritional and metabolic disease: Secondary | ICD-10-CM

## 2021-09-03 DIAGNOSIS — E559 Vitamin D deficiency, unspecified: Secondary | ICD-10-CM

## 2021-09-03 DIAGNOSIS — E039 Hypothyroidism, unspecified: Secondary | ICD-10-CM

## 2021-09-03 DIAGNOSIS — G9059 Complex regional pain syndrome I of other specified site: Secondary | ICD-10-CM

## 2021-09-03 NOTE — Progress Notes (Signed)
Virtual Visit via Video Note  I connected with Jo [name] on 09/03/21 at 11:30 AM EST by a video enabled telemedicine application and verified that I am speaking with the correct person using two identifiers.  Location patient: home Location provider: [place], [place] [id number] Persons participating in the virtual visit: patient, provider  I discussed the limitations of evaluation and management by telemedicine and the availability of in person appointments. The patient expressed understanding and agreed to proceed.  Jo [name] DOB: 1961/06/03 Encounter date: 09/03/2021  This is a 60 y.o. female who presents with Chief Complaint  Patient presents with   Follow-up    History of present illness:  She isn't feeling well. Just feels sick - thinks just combination of all of chronic conditions - lupus, fibromyalgia, etc. She states that she has felt like she has flu. Going to bed 2:30-3 in afternoon. Just tired. Hasn't felt well for about 6 weeks.   Yesterday at dentist urinated and there was foam in the toilet. Experienced this again later. Happened a little today as well. Not eating well; just doesn't feel like eating. Eating small amounts. She is losing weight - right now about 150lb.   Teeth have taken a toll this year; when sick, nauseated couldn't brush as well; so had to have extractions. Gums not healing as they should per dentist. She even went off of biologics to help but had to restart.   Still taking zofran - taking two now; but doesn't seem like it is working as well. When she went on imuran it helped bowels a lot, but then started itching from head to tow, so had to stop this. She is taking prevacid now. Just bought fiber supplement to start on. She got a benefiber prebiotic and citrucel. Also going to try probiotic.   Feels that lupus/ra cause her palpitations. Imuran also had helped with this. Has loop recorder in now and  following with cardiology.   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 Tries to limit to twice a day. Nucynta was not good med for her.methotrexate seemed to help pain in areas like legs - even walking was better.   She is taking B12 shots, cranberry, lysine, vitamin D now.   Doesn't do meal replacement if not feeling well; just upsets stomach.   Joints, muscles all hurt. Lower back. Gets knots in muscles. Hard to walk, just feels weak. Light sensitivity.   She has had some skin changes on knuckles - blister like and then right now just on right hand middle, ring, pinky fingers.   Has some chronic cough right now. Did feel like this went away on imuran when she took this.   Allergies  Allergen Reactions   Bee Pollen Anaphylaxis   Diazepam     sucidal depression   Prednisone Nausea And Vomiting    Per pt can not take oral steroids    Amoxicillin     Severe diarrhea At high dose    Doxycycline     Pt does not remember reaction   Emetrol     Other reaction(s): GI upset   Erythromycin     n & v   Gabapentin     Doesn't remember; didn't tolerate   Imuran [Azathioprine]     Patient states this was discontinued due to liver problems   Methotrexate Derivatives     diarrhea   Morphine     n & v   Propoxyphene  N-Acetaminophen     n & V   Sulfa Antibiotics     MIGRAINES   Tetracyclines & Related     N & v   Propoxyphene Other (See Comments)   Current Meds  Medication Sig   azelastine (ASTELIN) 0.1 % nasal spray Place 1 spray into both nostrils 2 (two) times daily. Use in each nostril as directed   B-D 3CC LUER-LOK SYR 23GX1" 23G X 1" 3 ML MISC See admin instructions.   Belimumab (BENLYSTA) 200 MG/ML SOSY once a week.   busPIRone (BUSPAR) 15 MG tablet TAKE 1/2 TABLET BY MOUTH EVERY MORNING and TAKE 1/2 TABLET BY MOUTH EVERYDAY AT BEDTIME   cholecalciferol (VITAMIN D) 1000 UNITS tablet Take 2,000 Units by mouth daily.   clindamycin (CLEOCIN T) 1 % lotion Apply 1 application  topically 2 (two) times daily as needed.   Cranberry 200 MG CAPS Take 200 mg by mouth 2 (two) times daily.   erythromycin with ethanol (EMGEL) 2 % gel Apply topically 2 (two) times daily. (Patient taking differently: Apply topically 2 (two) times daily as needed.)   estradiol (ESTRACE) 2 MG tablet Take 1 tablet (2 mg total) by mouth daily.   fexofenadine (ALLEGRA) 180 MG tablet Take 180 mg by mouth daily.   fluticasone (CUTIVATE) 0.05 % cream Apply 1 application topically 2 (two) times daily as needed (rash).    fluticasone (FLONASE) 50 MCG/ACT nasal spray Place 2 sprays into both nostrils daily.   folic acid (FOLVITE) 1 MG tablet Take 1 tablet (1 mg total) by mouth daily. (Patient taking differently: Take 1 mg by mouth in the morning and at bedtime.)   HYDROmorphone (DILAUDID) 4 MG tablet Take by mouth as needed for severe pain.   ketorolac (TORADOL) 30 MG/ML injection Inject 1 mL (30 mg total) into the muscle daily as needed for moderate pain.   lamoTRIgine (LAMICTAL) 100 MG tablet TAKE ONE TABLET BY MOUTH AT BREAKFAST AND AT BEDTIME   lansoprazole (PREVACID) 30 MG capsule Take 1 capsule (30 mg total) by mouth 2 (two) times daily before a meal.   levothyroxine (SYNTHROID) 125 MCG tablet TAKE ONE TABLET BY MOUTH EVERY MORNING   LYSINE PO Take 1 tablet by mouth daily.   medroxyPROGESTERone (PROVERA) 2.5 MG tablet TAKE 1 TABLET ALTERNATING WITH 2 TABLETS DAILY   methadone (DOLOPHINE) 5 MG tablet Take 1 tablet by mouth 2 (two) times daily.   metoCLOPramide (REGLAN) 10 MG tablet Take 1 tablet (10 mg total) by mouth every 6 (six) hours as needed for nausea.   ondansetron (ZOFRAN) 8 MG tablet Take 1 tablet (8 mg total) by mouth every 8 (eight) hours as needed for nausea or vomiting.   promethazine (PHENERGAN) 25 MG tablet Take 1 tablet (25 mg total) by mouth every 8 (eight) hours as needed for up to 20 doses for nausea or vomiting. Take one tablet every 8- 12 hours as needed.   sertraline (ZOLOFT) 100  MG tablet TAKE TWO TABLETS BY MOUTH EVERY MORNING   tiZANidine (ZANAFLEX) 4 MG capsule Take 3 capsules (12 mg total) by mouth at bedtime as needed for muscle spasms.   topiramate (TOPAMAX) 25 MG tablet Take by mouth.   triamcinolone cream (KENALOG) 0.1 % daily as needed.   Current Facility-Administered Medications for the 09/03/21 encounter (Video Visit) with Caren Macadam, MD  Medication   cyanocobalamin ((VITAMIN B-12)) injection 1,000 mcg    Review of Systems  Constitutional:  Positive for activity change, appetite change and  fatigue. Negative for chills and fever.  Respiratory:  Positive for cough. Negative for chest tightness, shortness of breath and wheezing.   Cardiovascular:  Negative for chest pain, palpitations and leg swelling.  Gastrointestinal:  Positive for abdominal pain.  Musculoskeletal:  Positive for arthralgias, back pain and myalgias.   Objective:  Temp (!) 96 F (35.6 C)       BP Readings from Last 3 Encounters:  06/26/21 112/72  06/18/21 100/62  06/18/21 124/72   Wt Readings from Last 3 Encounters:  06/26/21 157 lb (71.2 kg)  06/18/21 156 lb 2 oz (70.8 kg)  06/18/21 156 lb (70.8 kg)    Physical Exam  VITALS per patient if applicable:  GENERAL: alert, oriented, appears well and in no acute distress  HEENT: atraumatic, conjunctiva clear, no obvious abnormalities on inspection of external nose and ears  NECK: normal movements of the head and neck  LUNGS: on inspection no signs of respiratory distress, breathing rate appears normal, no obvious gross SOB, gasping or wheezing  CV: no obvious cyanosis  MS: moves all visible extremities without noticeable abnormality  PSYCH/NEURO: pleasant and cooperative, no obvious depression or anxiety, speech and thought processing grossly intact  Assessment/Plan  1. Mouth sores She gets these intermittently. Diet poor due to recurrent/ongoing chronic GI issues; will check labwork.  - Zinc; Future -  Vitamin B6; Future  2. Hypothyroidism, unspecified type On synthroid. Recheck labs.   3. Complex regional pain syndrome type 1 affecting other site At last rheumatology visit, patient states that there were no additional treamtents that were offered to her. She would like someone to help with any additional treatments that would help her. She is not looking for pain management as she already has this. She would like to work on controlling inflammation.  - Ambulatory referral to Rheumatology  4. Palpitations She is following with cardiology.   5. Systemic lupus erythematosus, unspecified SLE type, unspecified organ involvement status ([place]) - Ambulatory referral to Rheumatology  6. History of iron deficiency - IBC + Ferritin; Future  7. Vitamin B 12 deficiency - Vitamin B12; Future - Folate; Future - Methylmalonic acid, serum; Future - Homocysteine; Future - Magnesium, RBC; Future  8. Acquired hypothyroidism - TSH; Future  9. Weight loss - CBC with Differential/Platelet; Future - Comprehensive metabolic panel; Future  10. Vitamin D deficiency - VITAMIN D 25 Hydroxy (Vit-D Deficiency, Fractures); Future  11. Lipid screening - Lipid panel; Future  Return for call for bloodwork; follow up pending this.      Micheline Rough, MD

## 2021-09-04 ENCOUNTER — Telehealth: Payer: PPO

## 2021-09-04 NOTE — Progress Notes (Signed)
Per Pharmacy call to patient to confirm Prevacid should be added to her packs for breakfast and dinner, she confirmed that as accurate.Pharmacy advised   [place] Clinical Pharmacist Assistant [phone]

## 2021-09-05 LAB — CUP PACEART REMOTE DEVICE CHECK
Date Time Interrogation Session: 20221216073342
Implantable Pulse Generator Implant Date: 20221006

## 2021-09-08 ENCOUNTER — Other Ambulatory Visit: Payer: Self-pay | Admitting: Family Medicine

## 2021-09-08 ENCOUNTER — Ambulatory Visit (INDEPENDENT_AMBULATORY_CARE_PROVIDER_SITE_OTHER): Payer: PPO

## 2021-09-08 DIAGNOSIS — R55 Syncope and collapse: Secondary | ICD-10-CM

## 2021-09-08 DIAGNOSIS — J302 Other seasonal allergic rhinitis: Secondary | ICD-10-CM

## 2021-09-08 MED ORDER — AZELASTINE HCL 0.1 % NA SOLN: 1.0000 | Freq: Two times a day (BID) | NASAL | 2 refills | Status: DC

## 2021-09-17 NOTE — Progress Notes (Signed)
Carelink Summary Report / Loop Recorder 

## 2021-09-26 DIAGNOSIS — Z79891 Long term (current) use of opiate analgesic: Secondary | ICD-10-CM | POA: Diagnosis not present

## 2021-09-26 DIAGNOSIS — M542 Cervicalgia: Secondary | ICD-10-CM | POA: Diagnosis not present

## 2021-09-26 DIAGNOSIS — G90512 Complex regional pain syndrome I of left upper limb: Secondary | ICD-10-CM | POA: Diagnosis not present

## 2021-09-26 DIAGNOSIS — G894 Chronic pain syndrome: Secondary | ICD-10-CM | POA: Diagnosis not present

## 2021-09-30 ENCOUNTER — Other Ambulatory Visit: Payer: Self-pay | Admitting: Internal Medicine

## 2021-10-02 ENCOUNTER — Telehealth: Payer: Self-pay | Admitting: Pharmacist

## 2021-10-02 NOTE — Chronic Care Management (AMB) (Signed)
Chronic Care Management Pharmacy Assistant   Name: MAIREN WALLENSTEIN  MRN: [id number] DOB: [birth year]  10/02/21 APPOINTMENT REMINDER   Patient was reminded to have all medications, supplements and any blood glucose and blood pressure readings available for review with [name], Pharm. D, for telephone visit on 10/07/21 at 12:30.  Care Gaps: PNA Vaccine- Overdue COVID Booster -Overdue HIV Screening - Overdue TDAP - Overdue CCM 1/23 AWV 1/23  Star Rating Drug:  None  Any gaps in medications fill history? None      Medications: Outpatient Encounter Medications as of 10/02/2021  Medication Sig   azelastine (ASTELIN) 0.1 % nasal spray Place 1 spray into both nostrils 2 (two) times daily. Use in each nostril as directed   B-D 3CC LUER-LOK SYR 23GX1" 23G X 1" 3 ML MISC See admin instructions.   Belimumab (BENLYSTA) 200 MG/ML SOSY once a week.   busPIRone (BUSPAR) 15 MG tablet TAKE 1/2 TABLET BY MOUTH EVERY MORNING and TAKE 1/2 TABLET BY MOUTH EVERYDAY AT BEDTIME   cholecalciferol (VITAMIN D) 1000 UNITS tablet Take 2,000 Units by mouth daily.   clindamycin (CLEOCIN T) 1 % lotion Apply 1 application topically 2 (two) times daily as needed.   Cranberry 200 MG CAPS Take 200 mg by mouth 2 (two) times daily.   erythromycin with ethanol (EMGEL) 2 % gel Apply topically 2 (two) times daily. (Patient taking differently: Apply topically 2 (two) times daily as needed.)   estradiol (ESTRACE) 2 MG tablet Take 1 tablet (2 mg total) by mouth daily.   fexofenadine (ALLEGRA) 180 MG tablet Take 180 mg by mouth daily.   fluticasone (CUTIVATE) 0.05 % cream Apply 1 application topically 2 (two) times daily as needed (rash).    fluticasone (FLONASE) 50 MCG/ACT nasal spray Place 2 sprays into both nostrils daily.   folic acid (FOLVITE) 1 MG tablet Take 1 tablet (1 mg total) by mouth daily. (Patient taking differently: Take 1 mg by mouth in the morning and at bedtime.)   HYDROmorphone (DILAUDID) 4 MG  tablet Take by mouth as needed for severe pain.   ketorolac (TORADOL) 30 MG/ML injection Inject 1 mL (30 mg total) into the muscle daily as needed for moderate pain.   lamoTRIgine (LAMICTAL) 100 MG tablet TAKE ONE TABLET BY MOUTH AT BREAKFAST AND AT BEDTIME   lansoprazole (PREVACID) 30 MG capsule Take 1 capsule (30 mg total) by mouth 2 (two) times daily before a meal.   levothyroxine (SYNTHROID) 125 MCG tablet TAKE ONE TABLET BY MOUTH EVERY MORNING   LYSINE PO Take 1 tablet by mouth daily.   medroxyPROGESTERone (PROVERA) 2.5 MG tablet TAKE 1 TABLET ALTERNATING WITH 2 TABLETS DAILY   methadone (DOLOPHINE) 5 MG tablet Take 1 tablet by mouth 2 (two) times daily.   metoCLOPramide (REGLAN) 10 MG tablet Take 1 tablet (10 mg total) by mouth every 6 (six) hours as needed for nausea.   ondansetron (ZOFRAN) 8 MG tablet Take 1 tablet (8 mg total) by mouth every 8 (eight) hours as needed for nausea or vomiting.   promethazine (PHENERGAN) 25 MG tablet TAKE ONE TABLET BY MOUTH FOR 8 HOURS AS NEEDED   sertraline (ZOLOFT) 100 MG tablet TAKE TWO TABLETS BY MOUTH EVERY MORNING   sucralfate (CARAFATE) 1 g tablet Take 1 tablet (1 g total) by mouth 3 (three) times daily as needed for up to 14 days. (Patient taking differently: Take 1 g by mouth as needed.)   tiZANidine (ZANAFLEX) 4 MG capsule Take 3  capsules (12 mg total) by mouth at bedtime as needed for muscle spasms.   topiramate (TOPAMAX) 25 MG tablet Take by mouth.   triamcinolone cream (KENALOG) 0.1 % daily as needed.   Facility-Administered Encounter Medications as of 10/02/2021  Medication   cyanocobalamin ((VITAMIN B-12)) injection 1,000 mcg    [place] Clinical Pharmacist Assistant [phone]

## 2021-10-07 ENCOUNTER — Ambulatory Visit (INDEPENDENT_AMBULATORY_CARE_PROVIDER_SITE_OTHER): Payer: PPO | Admitting: Pharmacist

## 2021-10-07 DIAGNOSIS — M329 Systemic lupus erythematosus, unspecified: Secondary | ICD-10-CM

## 2021-10-07 DIAGNOSIS — J4520 Mild intermittent asthma, uncomplicated: Secondary | ICD-10-CM

## 2021-10-07 NOTE — Progress Notes (Signed)
Chronic Care Management Pharmacy Note  10/08/2021 Name:  Jo [name] MRN:  [id number] DOB:  10/07/1960  Summary: Pt reports she is concerned about bleeding  Recommendations/Changes made from today's visit: -Scheduled for visit with PCP for concern about bleeding and lab work -Consider genesight testing to determine best medication regimen and response  Plan: Follow up in 6 months   Subjective: Jo [name] is an 61 y.o. year old female who is a primary patient of Koberlein, Steele Berg, MD.  The CCM team was consulted for assistance with disease management and care coordination needs.    Engaged with patient by telephone for follow up visit in response to provider referral for pharmacy case management and/or care coordination services.   Consent to Services:  The patient was given information about Chronic Care Management services, agreed to services, and gave verbal consent prior to initiation of services.  Please see initial visit note for detailed documentation.   Patient Care Team: Caren Macadam, MD as PCP - General (Family Medicine) Lorretta Harp, MD as PCP - Cardiology (Cardiology) Vickie Epley, MD as PCP - Electrophysiology (Cardiology) Leone Payor, MD as Referring Physician (Anesthesiology) Gatha Mayer, MD as Consulting Physician (Gastroenterology) Magrinat, Virgie Dad, MD as Consulting Physician (Oncology) [name], MD as Consulting Physician (Rheumatology) Jacelyn Pi, MD as Referring Physician (Endocrinology) Viona Gilmore, [facility] as Pharmacist (Pharmacist)  Recent office visits: 09/03/21 Micheline Rough, MD: Patient presented for video visit for mouth sores and chronic conditions follow up. Referral for new rheumatologist.   06/04/21 Micheline Rough, MD: Patient presented for chronic conditions follow up and abdominal pain. Prescribed toradol IM PRN.  Recent consult visits: 08/28/21 [name] (rheumatology): Patient presented for  lupus follow up.  08/27/21 Nicholaus Bloom (pain medicine): Patient presented for chronic pain follow up. Unable to access notes.  08/18/21 Nicholaus Bloom (pain medicine): Patient presented for chronic pain follow up. Unable to access notes.  08/04/21 Simone Curia, RN (Cardiology) - Patient presented for Syncope and collapse. No medication changes.  07/17/21 Nicholaus Bloom (pain medicine): Patient presented for chronic pain follow up. Unable to access notes.  06-26-2021 Vickie Epley, MD (Cardiology)- Patient presented for Syncope and collapse and other concerns. No medication changes.   06-18-2021 Gatha Mayer, MD Gertie Fey) - Patient presented for IBS and other concerns. Prescribed Dexlansoprazole .Changed Ketorolac to 30 mg IM PRN daily. Increased Onasteron to 8 mg every 8 hours PRN.   06-18-2021 Lorretta Harp, MD (Cardiology) - Patient presented for palpitations and other concerns. Changed B-12 injections to every 2 weeks.  04/28/21 Wilber Bihari, NP (hem/onc): Patient presented for iron deficiency follow up.  02/05/21 Emeterio Reeve MD (Obstetrics and Gynecology) - seen for intial consult for endometrial mass. Decreased estradiol to 1/2 tablet every morning.  Hospital visits: None in past 6 months  Objective:  Lab Results  Component Value Date   CREATININE 1.08 (H) 04/28/2021   BUN 14 04/28/2021   GFR 68.20 11/15/2019   GFRNONAA 60 (L) 04/28/2021   GFRAA >60 05/15/2020   NA 142 04/28/2021   K 4.3 04/28/2021   CALCIUM 9.9 04/28/2021   CO2 24 04/28/2021   GLUCOSE 93 04/28/2021    Lab Results  Component Value Date/Time   HGBA1C 5.6 11/15/2019 09:49 AM   GFR 68.20 11/15/2019 09:49 AM   GFR 64.03 04/20/2019 11:25 AM    Last diabetic Eye exam: No results found for: HMDIABEYEEXA  Last diabetic Foot exam: No results found  for: HMDIABFOOTEX   Lab Results  Component Value Date   CHOL 175 11/15/2019   HDL 75.00 11/15/2019   LDLCALC 74 11/15/2019   TRIG 130.0  11/15/2019   CHOLHDL 2 11/15/2019    Hepatic Function Latest Ref Rng & Units 04/28/2021 01/07/2021 08/13/2020  Total Protein 6.5 - 8.1 g/dL 7.1 7.0 7.2  Albumin 3.5 - 5.0 g/dL 4.3 4.3 4.2  AST 15 - 41 U/L $Remo'15 16 15  'phMTA$ ALT 0 - 44 U/L $Remo'8 9 10  'wQZvW$ Alk Phosphatase 38 - 126 U/L 71 42 55  Total Bilirubin 0.3 - 1.2 mg/dL <0.2(L) 0.6 0.2(L)    Lab Results  Component Value Date/Time   TSH 1.260 03/11/2021 03:36 PM   TSH 0.41 01/30/2021 11:59 AM    CBC Latest Ref Rng & Units 04/28/2021 03/11/2021 01/07/2021  WBC 4.0 - 10.5 K/uL 4.3 4.9 5.1  Hemoglobin 12.0 - 15.0 g/dL 12.7 13.2 13.2  Hematocrit 36.0 - 46.0 % 37.7 39.8 40.1  Platelets 150 - 400 K/uL 241 291 288    Lab Results  Component Value Date/Time   VD25OH 83.61 04/27/2018 03:38 PM    Clinical ASCVD: No  The 10-year ASCVD risk score (Arnett DK, et al., 2019) is: 2.8%   Values used to calculate the score:     Age: 15 years     Sex: Female     Is Non-Hispanic African American: No     Diabetic: Yes     Tobacco smoker: No     Systolic Blood Pressure: 111 mmHg     Is BP treated: No     HDL Cholesterol: 75 mg/dL     Total Cholesterol: 175 mg/dL    Depression screen [facility] 2/9 09/03/2021 02/05/2021 10/09/2020  Decreased Interest 0 0 0  Down, Depressed, Hopeless 1 0 0  PHQ - 2 Score 1 0 0  Altered sleeping 0 2 0  Tired, decreased energy 3 3 0  Change in appetite 3 3 0  Feeling bad or failure about yourself  0 1 0  Trouble concentrating 2 2 0  Moving slowly or fidgety/restless 0 - 0  Suicidal thoughts 0 0 0  PHQ-9 Score 9 11 0  Difficult doing work/chores - - Not difficult at all  Some recent data might be hidden      Social History   Tobacco Use  Smoking Status Never  Smokeless Tobacco Never   BP Readings from Last 3 Encounters:  06/26/21 112/72  06/18/21 100/62  06/18/21 124/72   Pulse Readings from Last 3 Encounters:  06/26/21 71  06/18/21 73  06/18/21 63   Wt Readings from Last 3 Encounters:  06/26/21 157 lb (71.2 kg)   06/18/21 156 lb 2 oz (70.8 kg)  06/18/21 156 lb (70.8 kg)   BMI Readings from Last 3 Encounters:  06/26/21 26.95 kg/m  06/18/21 26.80 kg/m  06/18/21 26.78 kg/m    Assessment/Interventions: Review of patient past medical history, allergies, medications, health status, including review of consultants reports, laboratory and other test data, was performed as part of comprehensive evaluation and provision of chronic care management services.   SDOH:  (Social Determinants of Health) assessments and interventions performed: No  SDOH Screenings   Alcohol Screen: Low Risk    Last Alcohol Screening Score (AUDIT): 0  Depression (PHQ2-9): Medium Risk   PHQ-2 Score: 9  Financial Resource Strain: High Risk   Difficulty of Paying Living Expenses: Hard  Food Insecurity: Food Insecurity Present   Worried About Running Out  of Food in the Last Year: Sometimes true   Ran Out of Food in the Last Year: Never true  Housing: Low Risk    Last Housing Risk Score: 0  Physical Activity: Inactive   Days of Exercise per Week: 0 days   Minutes of Exercise per Session: 30 min  Social Connections: Moderately Integrated   Frequency of Communication with Friends and Family: More than three times a week   Frequency of Social Gatherings with Friends and Family: Once a week   Attends Religious Services: More than 4 times per year   Active Member of [employer] or Organizations: Yes   Attends Engineer, structural: More than 4 times per year   Marital Status: Divorced  Stress: Stress Concern Present   Feeling of Stress : To some extent  Tobacco Use: Low Risk    Smoking Tobacco Use: Never   Smokeless Tobacco Use: Never   Passive Exposure: Not on file  Transportation Needs: No Transportation Needs   Lack of Transportation (Medical): No   Lack of Transportation (Non-Medical): No    CCM Care Plan  Allergies  Allergen Reactions   Bee Pollen Anaphylaxis   Diazepam     sucidal depression   Prednisone  Nausea And Vomiting    Per pt can not take oral steroids    Amoxicillin     Severe diarrhea At high dose    Doxycycline     Pt does not remember reaction   Emetrol     Other reaction(s): GI upset   Erythromycin     n & v   Gabapentin     Doesn't remember; didn't tolerate   Imuran [Azathioprine]     Patient states this was discontinued due to liver problems   Methotrexate Derivatives     diarrhea   Morphine     n & v   Propoxyphene N-Acetaminophen     n & V   Sulfa Antibiotics     MIGRAINES   Tetracyclines & Related     N & v   Propoxyphene Other (See Comments)    Medications Reviewed Today     Reviewed by Johnella Moloney, CMA (Certified Medical Assistant) on 09/03/21 at 1107  Med List Status: <None>   Medication Order Taking? Sig Documenting Provider Last Dose Status Informant  azelastine (ASTELIN) 0.1 % nasal spray [id number] Yes Place 1 spray into both nostrils 2 (two) times daily. Use in each nostril as directed Koberlein, Paris Lore, MD Taking Active Self  B-D 3CC LUER-LOK SYR 23GX1" 23G X 1" 3 ML MISC [id number] Yes See admin instructions. [provider] Taking Active   Belimumab (BENLYSTA) 200 MG/ML SOSY [id number] Yes once a week. [provider] Taking Active   busPIRone (BUSPAR) 15 MG tablet [id number] Yes TAKE 1/2 TABLET BY MOUTH EVERY MORNING and TAKE 1/2 TABLET BY MOUTH EVERYDAY AT BEDTIME Koberlein, Junell C, MD Taking Active   cholecalciferol (VITAMIN D) 1000 UNITS tablet [id number] Yes Take 2,000 Units by mouth daily. [provider] Taking Active Self  clindamycin (CLEOCIN T) 1 % lotion [id number] Yes Apply 1 application topically 2 (two) times daily as needed. [provider] Taking Active Self  Cranberry 200 MG CAPS [id number] Yes Take 200 mg by mouth 2 (two) times daily. [provider] Taking Active Self           Med Note Hedda Slade   Wed Feb 05, 2021 10:45 AM)    cyanocobalamin ((VITAMIN B-12)) injection 1,000  mcg [id number]   Caren Macadam, MD  Active   erythromycin with ethanol (EMGEL) 2 % gel [id number] Yes Apply topically 2 (two) times daily.  Patient taking differently: Apply topically 2 (two) times daily as needed.   Caren Macadam, MD Taking Active   estradiol (ESTRACE) 2 MG tablet [id number] Yes Take 1 tablet (2 mg total) by mouth daily. Caren Macadam, MD Taking Active   fexofenadine (ALLEGRA) 180 MG tablet [id number] Yes Take 180 mg by mouth daily. [provider] Taking Active   fluticasone (CUTIVATE) 0.05 % cream [id number] Yes Apply 1 application topically 2 (two) times daily as needed (rash).  [provider] Taking Active Self  fluticasone (FLONASE) 50 MCG/ACT nasal spray [id number] Yes Place 2 sprays into both nostrils daily. Caren Macadam, MD Taking Active   folic acid (FOLVITE) 1 MG tablet [id number] Yes Take 1 tablet (1 mg total) by mouth daily.  Patient taking differently: Take 1 mg by mouth in the morning and at bedtime.   Gardenia Phlegm, NP Taking Active   HYDROmorphone (DILAUDID) 4 MG tablet [id number] Yes Take by mouth as needed for severe pain. [provider] Taking Active   ketorolac (TORADOL) 30 MG/ML injection [id number] Yes Inject 1 mL (30 mg total) into the muscle daily as needed for moderate pain. Caren Macadam, MD Taking Active   lamoTRIgine (LAMICTAL) 100 MG tablet [id number] Yes TAKE ONE TABLET BY MOUTH AT BREAKFAST AND AT BEDTIME Koberlein, Junell C, MD Taking Active   lansoprazole (PREVACID) 30 MG capsule [id number] Yes Take 1 capsule (30 mg total) by mouth 2 (two) times daily before a meal. Gatha Mayer, MD Taking Active   levothyroxine (SYNTHROID) 125 MCG tablet [id number] Yes TAKE ONE TABLET BY MOUTH EVERY MORNING Caren Macadam, MD Taking Active   LYSINE PO [id number] Yes Take 1 tablet by mouth daily. [provider] Taking Active   medroxyPROGESTERone (PROVERA) 2.5 MG tablet [id number] Yes TAKE 1  TABLET ALTERNATING WITH 2 TABLETS DAILY Koberlein, Junell C, MD Taking Active   methadone (DOLOPHINE) 5 MG tablet [id number] Yes Take 1 tablet by mouth 2 (two) times daily. [provider] Taking Active   metoCLOPramide (REGLAN) 10 MG tablet [id number] Yes Take 1 tablet (10 mg total) by mouth every 6 (six) hours as needed for nausea. Gatha Mayer, MD Taking Active   ondansetron [employer]) 8 MG tablet [id number] Yes Take 1 tablet (8 mg total) by mouth every 8 (eight) hours as needed for nausea or vomiting. Gatha Mayer, MD Taking Active   promethazine (PHENERGAN) 25 MG tablet [id number] Yes Take 1 tablet (25 mg total) by mouth every 8 (eight) hours as needed for up to 20 doses for nausea or vomiting. Take one tablet every 8- 12 hours as needed. Gatha Mayer, MD Taking Active   sertraline (ZOLOFT) 100 MG tablet [id number] Yes TAKE TWO TABLETS BY MOUTH EVERY MORNING Koberlein, Steele Berg, MD Taking Active   sucralfate (CARAFATE) 1 g tablet [id number]  Take 1 tablet (1 g total) by mouth 3 (three) times daily as needed for up to 14 days.  Patient taking differently: Take 1 g by mouth as needed.   Gardenia Phlegm, NP  Expired 05/12/21 2359   tiZANidine (ZANAFLEX) 4 MG capsule [id number] Yes Take 3 capsules (12 mg total) by mouth at bedtime as needed for muscle spasms. Caren Macadam, MD Taking Active   topiramate (TOPAMAX) 25 MG tablet [id number] Yes Take by mouth. [provider] Taking Active   triamcinolone cream (KENALOG) 0.1 % [id number] Yes daily as needed. [provider] Taking Active             Patient Active Problem List   Diagnosis Date Noted   Palpitations 06/18/2021   Atypical chest pain 06/18/2021   Depression, recurrent ([place]) 07/22/2020   Systemic lupus erythematosus ([place]) 04/29/2020   Iron deficiency 12/13/2019   Reflex sympathetic dystrophy of lower extremity 06/12/2019   Disorder of thyroid gland 06/12/2019   Falls 09/25/2015    Musculoskeletal neck pain 09/25/2015   Headache 09/25/2015   Memory changes 09/25/2015   Blurry vision 09/25/2015   Neck pain 09/25/2015   Polycystic ovaries 07/11/2013   Hypothyroidism 05/04/2013   Complex regional pain syndrome I, unspecified 03/06/2013   Extremity pain 91/69/4503   Helicobacter pylori infection 12/22/2012   History of colonic polyps 02/25/2009   OBESITY 01/14/2008   DEPRESSION/ANXIETY 01/14/2008   Asthma 01/14/2008   GERD 01/14/2008   Irritable colon 01/14/2008    Immunization History  Administered Date(s) Administered   Influenza, Quadrivalent, Recombinant, Inj, Pf 06/18/2020   Influenza, Seasonal, Injecte, Preservative Fre 06/21/2014, 06/26/2015, 07/29/2016   Influenza,inj,Quad PF,6+ Mos 07/15/2017, 06/15/2018, 06/13/2019   Influenza,inj,quad, With Preservative 07/11/2014, 07/19/2017   Influenza,trivalent, recombinat, inj, PF 06/14/2013   Influenza-Unspecified 06/18/2020, 06/02/2021   PFIZER(Purple Top)SARS-COV-2 Vaccination 12/28/2019, 01/25/2020, 08/06/2020, 02/03/2021, 06/02/2021   Pneumococcal Polysaccharide-23 10/09/2015   Tdap 10/23/2007   Zoster Recombinat (Shingrix) 11/01/2020, 01/10/2021   Patient reports her biggest concern is with bleeding right now and a possible growth in her fallopian tube. She was unable to get into see OBGYN until Feb 8th and this was concerning for her.  Patient reports she likes the price of her new place but does not like that it is 20-25 minutes out from [place]. She only has a couple of neighbors but does like them. She reports she feels a "little lonely" and hasn't lived in a house in a long time. Her sister found her this place and lives nearby but she doesn't get to see her often.  Conditions to be addressed/monitored:  GERD, Asthma, Hypothyroidism, Depression, Anxiety, Allergic Rhinitis, and PCOS, iron deficiency, vitamin B12 deficiency, chronic pain, lupus  Conditions addressed this visit: Lupus, GERD,  hypothyroidism  Care Plan : CCM Pharmacy Care Plan  Updates made by Viona Gilmore, [place] since 10/08/2021 12:00 AM     Problem: Problem: GERD, Asthma, Hypothyroidism, Depression, Anxiety, Allergic Rhinitis, and PCOS, iron deficiency, vitamin B12 deficiency, chronic pain, lupus      Long-Range Goal: Patient-Specific Goal   Start Date: 03/10/2021  Expected End Date: 03/10/2022  Recent Progress: On track  Priority: High  Note:   Current Barriers:  Unable to independently monitor therapeutic efficacy  Pharmacist Clinical Goal(s):  Patient will achieve adherence to monitoring guidelines and medication adherence to achieve therapeutic efficacy through collaboration with PharmD and provider.   Interventions: 1:1 collaboration with Caren Macadam, MD regarding development and update of comprehensive plan of care as evidenced by provider attestation and co-signature Inter-disciplinary care team collaboration (see longitudinal plan of care) Comprehensive medication review performed; medication list updated in electronic medical record  Asthma (Goal: control symptoms) -Controlled -Current treatment  Montelukast (Singulair) 10mg , 1 tablet daily at bedtime - Appropriate, Effective, Safe, Accessible -Medications previously tried: patient states previous nebulizers and inhalers have not worked for her (makes her shaky) -Pulmonary function testing: n/a -Exacerbations requiring treatment in last 6 months: none -Patient denies consistent use of  maintenance inhaler -Frequency of rescue inhaler use: does not have -Counseled on  benefits of having a rescue inhaler -Recommended to continue current medication  Hypothyroidism (Goal: TSH 0.35-4.5) -Controlled -Current treatment  levothyroxine 156mcg, 1 tablet once daily - Appropriate, Effective, Safe, Accessible -Medications previously tried: none  -Counseled on consistent administration of levothyroxine (regards to food and other medications)    Iron deficiency (Goal: ferritin 46-336) -Controlled -Current treatment  Ferumoxytol (FERAHEME) infusion - not currently on -Medications previously tried: n/a  -Recommended to continue current medication  PCOS (Goal: minimize symptoms) -Controlled -Current treatment  No medications -Medications previously tried: metformin (tapered off with GI symptoms)  -Recommended to continue as is  Folate deficiency (Goal: normal folate) -Controlled -Current treatment  folic acid 1 mg, 1 tablet once daily - Appropriate, Effective, Safe, Accessible -Medications previously tried: none  -Recommended to continue current medication  Lupus (Goal: minimize symptoms) -Controlled -Current treatment  Benlysta - Appropriate, Effective, Safe, Accessible  -Medications previously tried: methotrexate (n/v and fatigue) -Recommended to continue current medication  GI health (GERD, nausea, GI spasms (Goal: minimize symptoms)) -Uncontrolled -Current treatment  Zegerid 20-1100mg , 1 capsule once daily  - Appropriate, Effective, Safe, Accessible sucralfate 1g, 1 tablet four times daily with meals and at bedtime - Appropriate, Effective, Safe, Accessible Metoclopramide 10mg , 1 tablet four times daily (patient has been using as needed) - Appropriate, Effective, Safe, Accessible ondansetron (Zofran) 4mg , 1 tablet every eight hours as needed for nausea - Appropriate, Effective, Safe, Accessible -Medications previously tried: n/a  -Recommended to continue current medication   Depression/Anxiety (Goal: minimize symptoms) -Not ideally controlled -Current treatment: buspirone (Buspar) 15mg , 0.5 tablet (7.5mg ) twice daily - Appropriate, Effective, Safe, Accessible  sertraline 100mg , 2 tablets once daily - Appropriate, Query effective, Safe, Accessible lamotrigine 100mg ,1/2 tablet in AM and 1 tablet in PM - Appropriate, Query effective, Safe, Accessible -Medications previously tried/failed: n/a -PHQ9: 9 -GAD7:  4 -Educated on Benefits of medication for symptom control Benefits of cognitive-behavioral therapy with or without medication -Recommended to continue current medication Consider genesight testing to determine best medication regimen and response.  Pain (Goal: minimize pain) -Uncontrolled -Current treatment  hydromorphone 4mg , 1 tablet every 4 hours as needed for moderate pain or severe pain - Appropriate, Effective, Safe, Accessible methadone 10mg , (Patient reports taking: 2 tablet in the morning, 2 tablets around noon, and 1 tab at bedtime) - Appropriate, Effective, Safe, Accessible topiramate 25mg , 1 tablet in the morning and 3 tablets at bedtime - Appropriate, Effective, Safe, Accessible tizanidine 4mg , 4 capsules at bedtime as needed for muscle spasms - Appropriate, Effective, Safe, Accessible  -Medications previously tried: Nucynta -Recommended to continue current medication  Menopausal symptoms (Goal: minimize symptoms) -Controlled -Current treatment  estradiol 2mg , 1 tablet once daily - Appropriate, Effective, Query Safe, Accessible medroxyprogesterone 2.5mg , 1 tablet daily - Appropriate, Effective, Safe, Accessible -Medications previously tried: none  - Consider estrogen patch based on lower incidence of long term risks.  Allergic rhinitis (Goal: minimize symptoms) -Controlled -Current treatment  azelastine (Astelin) 0.1% nasal spray, 1 spray into both nostrils twice daily  - Appropriate, Effective, Safe, Accessible Allegra, 180mg , 1 tablet at bedtime - Appropriate, Effective, Safe, Accessible fluticasone (Flonase) 47mcg/act nasal spray, 2 sprays into each nostril every day - Appropriate, Effective, Safe, Accessible -Medications previously tried: none  -Recommended to continue current medication  Vitamin B12 deficiency (Goal: 211-911) -Controlled -Current treatment  cyanocobalamin 1000 mcg/ml, inject 1 ML into muscle every 2 weeks for 2 doses and then once every month -  Appropriate, Effective, Safe, Accessible -Medications previously tried: none  -Recommended to continue current medication Recommended repeat vitamin B12 level   Health Maintenance -Vaccine gaps: 2nd shingrix dose, tetanus -Current therapy:  Cranberry 4200 mg 1 capsule daily Vitamin D 2000 units daily Lysine 1 tablet daily -Educated on Cost vs benefit of each product must be carefully weighed by individual consumer -Patient is satisfied with current therapy and denies issues -Recommended to continue current medication  Patient Goals/Self-Care Activities Patient will:  - take medications as prescribed  Follow Up Plan: Telephone follow up appointment with care management team member scheduled for:6 months      Medication Assistance: None required.  Patient affirms current coverage meets needs.  Compliance/Adherence/Medication fill history: Care Gaps: COVID booster, tetanus, HIV screening, PCV vaccine  Star-Rating Drugs: None  Patient's preferred pharmacy is:  [facility] [place], [place] - [address] [place] [address] Phone: [phone] Fax: [phone]   Uses pill box? No - adherence packaging Pt endorses 100% compliance  We discussed: Benefits of medication synchronization, packaging and delivery as well as enhanced pharmacist oversight with [facility]. Patient decided to: Utilize [facility] pharmacy for medication synchronization, packaging and delivery  Care Plan and Follow Up Patient Decision:  Patient agrees to Care Plan and Follow-up.  Plan: Telephone follow up appointment with care management team member scheduled for:  6 months  [name], PharmD, [place] Pharmacist [facility] [place] [phone]

## 2021-10-08 NOTE — Patient Instructions (Signed)
Hi Sugey,  It was great to catch up with you! I am glad you are liking your new place for the most part!   Please reach out to me if you have any questions or need anything before our follow up!  Best, Maddie  [name], PharmD, [facility] [place]   Visit Information   Goals Addressed   None    Patient Care Plan: CCM Pharmacy Care Plan     Problem Identified: Problem: GERD, Asthma, Hypothyroidism, Depression, Anxiety, Allergic Rhinitis, and PCOS, iron deficiency, vitamin B12 deficiency, chronic pain, lupus      Long-Range Goal: Patient-Specific Goal   Start Date: 03/10/2021  Expected End Date: 03/10/2022  Recent Progress: On track  Priority: High  Note:   Current Barriers:  Unable to independently monitor therapeutic efficacy  Pharmacist Clinical Goal(s):  Patient will achieve adherence to monitoring guidelines and medication adherence to achieve therapeutic efficacy through collaboration with PharmD and provider.   Interventions: 1:1 collaboration with Caren Macadam, MD regarding development and update of comprehensive plan of care as evidenced by provider attestation and co-signature Inter-disciplinary care team collaboration (see longitudinal plan of care) Comprehensive medication review performed; medication list updated in electronic medical record  Asthma (Goal: control symptoms) -Controlled -Current treatment  Montelukast (Singulair) 10mg , 1 tablet daily at bedtime - Appropriate, Effective, Safe, Accessible -Medications previously tried: patient states previous nebulizers and inhalers have not worked for her (makes her shaky) -Pulmonary function testing: n/a -Exacerbations requiring treatment in last 6 months: none -Patient denies consistent use of maintenance inhaler -Frequency of rescue inhaler use: does not have -Counseled on  benefits of having a rescue inhaler -Recommended to continue current  medication  Hypothyroidism (Goal: TSH 0.35-4.5) -Controlled -Current treatment  levothyroxine 130mcg, 1 tablet once daily - Appropriate, Effective, Safe, Accessible -Medications previously tried: none  -Counseled on consistent administration of levothyroxine (regards to food and other medications)   Iron deficiency (Goal: ferritin 46-336) -Controlled -Current treatment  Ferumoxytol (FERAHEME) infusion - not currently on -Medications previously tried: n/a  -Recommended to continue current medication  PCOS (Goal: minimize symptoms) -Controlled -Current treatment  No medications -Medications previously tried: metformin (tapered off with GI symptoms)  -Recommended to continue as is  Folate deficiency (Goal: normal folate) -Controlled -Current treatment  folic acid 1 mg, 1 tablet once daily - Appropriate, Effective, Safe, Accessible -Medications previously tried: none  -Recommended to continue current medication  Lupus (Goal: minimize symptoms) -Controlled -Current treatment  Benlysta - Appropriate, Effective, Safe, Accessible  -Medications previously tried: methotrexate (n/v and fatigue) -Recommended to continue current medication  GI health (GERD, nausea, GI spasms (Goal: minimize symptoms)) -Uncontrolled -Current treatment  Zegerid 20-1100mg , 1 capsule once daily  - Appropriate, Effective, Safe, Accessible sucralfate 1g, 1 tablet four times daily with meals and at bedtime - Appropriate, Effective, Safe, Accessible Metoclopramide 10mg , 1 tablet four times daily (patient has been using as needed) - Appropriate, Effective, Safe, Accessible ondansetron (Zofran) 4mg , 1 tablet every eight hours as needed for nausea - Appropriate, Effective, Safe, Accessible -Medications previously tried: n/a  -Recommended to continue current medication   Depression/Anxiety (Goal: minimize symptoms) -Not ideally controlled -Current treatment: buspirone (Buspar) 15mg , 0.5 tablet (7.5mg ) twice  daily - Appropriate, Effective, Safe, Accessible  sertraline 100mg , 2 tablets once daily - Appropriate, Query effective, Safe, Accessible lamotrigine 100mg ,1/2 tablet in AM and 1 tablet in PM - Appropriate, Query effective, Safe, Accessible -Medications previously tried/failed: n/a -PHQ9: 9 -GAD7: 4 -Educated on Benefits  of medication for symptom control Benefits of cognitive-behavioral therapy with or without medication -Recommended to continue current medication Consider genesight testing to determine best medication regimen and response.  Pain (Goal: minimize pain) -Uncontrolled -Current treatment  hydromorphone 4mg , 1 tablet every 4 hours as needed for moderate pain or severe pain - Appropriate, Effective, Safe, Accessible methadone 10mg , (Patient reports taking: 2 tablet in the morning, 2 tablets around noon, and 1 tab at bedtime) - Appropriate, Effective, Safe, Accessible topiramate 25mg , 1 tablet in the morning and 3 tablets at bedtime - Appropriate, Effective, Safe, Accessible tizanidine 4mg , 4 capsules at bedtime as needed for muscle spasms - Appropriate, Effective, Safe, Accessible  -Medications previously tried: Nucynta -Recommended to continue current medication  Menopausal symptoms (Goal: minimize symptoms) -Controlled -Current treatment  estradiol 2mg , 1 tablet once daily - Appropriate, Effective, Query Safe, Accessible medroxyprogesterone 2.5mg , 1 tablet daily - Appropriate, Effective, Safe, Accessible -Medications previously tried: none  - Consider estrogen patch based on lower incidence of long term risks.  Allergic rhinitis (Goal: minimize symptoms) -Controlled -Current treatment  azelastine (Astelin) 0.1% nasal spray, 1 spray into both nostrils twice daily  - Appropriate, Effective, Safe, Accessible Allegra, 180mg , 1 tablet at bedtime - Appropriate, Effective, Safe, Accessible fluticasone (Flonase) 31mcg/act nasal spray, 2 sprays into each nostril every day -  Appropriate, Effective, Safe, Accessible -Medications previously tried: none  -Recommended to continue current medication  Vitamin B12 deficiency (Goal: 211-911) -Controlled -Current treatment  cyanocobalamin 1000 mcg/ml, inject 1 ML into muscle every 2 weeks for 2 doses and then once every month - Appropriate, Effective, Safe, Accessible -Medications previously tried: none  -Recommended to continue current medication Recommended repeat vitamin B12 level   Health Maintenance -Vaccine gaps: 2nd shingrix dose, tetanus -Current therapy:  Cranberry 4200 mg 1 capsule daily Vitamin D 2000 units daily Lysine 1 tablet daily -Educated on Cost vs benefit of each product must be carefully weighed by individual consumer -Patient is satisfied with current therapy and denies issues -Recommended to continue current medication  Patient Goals/Self-Care Activities Patient will:  - take medications as prescribed  Follow Up Plan: Telephone follow up appointment with care management team member scheduled for:6 months       Patient verbalizes understanding of instructions and care plan provided today and agrees to view in [place]. Active MyChart status confirmed with patient.   Telephone follow up appointment with pharmacy team member scheduled for: 6 months  Viona Gilmore, [facility]

## 2021-10-10 ENCOUNTER — Ambulatory Visit: Payer: PPO

## 2021-10-13 ENCOUNTER — Ambulatory Visit (INDEPENDENT_AMBULATORY_CARE_PROVIDER_SITE_OTHER): Payer: PPO

## 2021-10-13 DIAGNOSIS — R55 Syncope and collapse: Secondary | ICD-10-CM | POA: Diagnosis not present

## 2021-10-13 LAB — CUP PACEART REMOTE DEVICE CHECK
Date Time Interrogation Session: 20230122230611
Implantable Pulse Generator Implant Date: 20221006

## 2021-10-14 ENCOUNTER — Other Ambulatory Visit: Payer: PPO

## 2021-10-17 ENCOUNTER — Ambulatory Visit: Payer: PPO | Admitting: Family Medicine

## 2021-10-17 ENCOUNTER — Other Ambulatory Visit: Payer: PPO

## 2021-10-21 DIAGNOSIS — J4520 Mild intermittent asthma, uncomplicated: Secondary | ICD-10-CM | POA: Diagnosis not present

## 2021-10-21 DIAGNOSIS — F419 Anxiety disorder, unspecified: Secondary | ICD-10-CM

## 2021-10-21 DIAGNOSIS — E039 Hypothyroidism, unspecified: Secondary | ICD-10-CM

## 2021-10-22 ENCOUNTER — Other Ambulatory Visit: Payer: PPO

## 2021-10-22 DIAGNOSIS — L298 Other pruritus: Secondary | ICD-10-CM | POA: Diagnosis not present

## 2021-10-22 DIAGNOSIS — M3210 Systemic lupus erythematosus, organ or system involvement unspecified: Secondary | ICD-10-CM | POA: Diagnosis not present

## 2021-10-23 ENCOUNTER — Other Ambulatory Visit (INDEPENDENT_AMBULATORY_CARE_PROVIDER_SITE_OTHER): Payer: PPO

## 2021-10-23 ENCOUNTER — Other Ambulatory Visit: Payer: PPO

## 2021-10-23 DIAGNOSIS — E531 Pyridoxine deficiency: Secondary | ICD-10-CM | POA: Diagnosis not present

## 2021-10-23 DIAGNOSIS — K1379 Other lesions of oral mucosa: Secondary | ICD-10-CM | POA: Diagnosis not present

## 2021-10-23 DIAGNOSIS — G90512 Complex regional pain syndrome I of left upper limb: Secondary | ICD-10-CM | POA: Diagnosis not present

## 2021-10-23 DIAGNOSIS — I7300 Raynaud's syndrome without gangrene: Secondary | ICD-10-CM | POA: Insufficient documentation

## 2021-10-23 DIAGNOSIS — Z8639 Personal history of other endocrine, nutritional and metabolic disease: Secondary | ICD-10-CM | POA: Diagnosis not present

## 2021-10-23 DIAGNOSIS — R634 Abnormal weight loss: Secondary | ICD-10-CM | POA: Diagnosis not present

## 2021-10-23 DIAGNOSIS — E559 Vitamin D deficiency, unspecified: Secondary | ICD-10-CM

## 2021-10-23 DIAGNOSIS — E538 Deficiency of other specified B group vitamins: Secondary | ICD-10-CM

## 2021-10-23 DIAGNOSIS — Z13220 Encounter for screening for lipoid disorders: Secondary | ICD-10-CM

## 2021-10-23 DIAGNOSIS — Z79891 Long term (current) use of opiate analgesic: Secondary | ICD-10-CM | POA: Diagnosis not present

## 2021-10-23 DIAGNOSIS — D649 Anemia, unspecified: Secondary | ICD-10-CM | POA: Insufficient documentation

## 2021-10-23 DIAGNOSIS — E039 Hypothyroidism, unspecified: Secondary | ICD-10-CM | POA: Diagnosis not present

## 2021-10-23 DIAGNOSIS — M542 Cervicalgia: Secondary | ICD-10-CM | POA: Diagnosis not present

## 2021-10-23 DIAGNOSIS — G894 Chronic pain syndrome: Secondary | ICD-10-CM | POA: Diagnosis not present

## 2021-10-23 LAB — COMPREHENSIVE METABOLIC PANEL
ALT: 5 U/L (ref 0–35)
AST: 15 U/L (ref 0–37)
Albumin: 4.4 g/dL (ref 3.5–5.2)
Alkaline Phosphatase: 45 U/L (ref 39–117)
BUN: 13 mg/dL (ref 6–23)
CO2: 27 mEq/L (ref 19–32)
Calcium: 9.4 mg/dL (ref 8.4–10.5)
Chloride: 104 mEq/L (ref 96–112)
Creatinine, Ser: 0.99 mg/dL (ref 0.40–1.20)
GFR: 63 mL/min (ref >60.00)
Glucose, Bld: 93 mg/dL (ref 70–99)
Potassium: 4.1 mEq/L (ref 3.5–5.1)
Sodium: 138 mEq/L (ref 135–145)
Total Bilirubin: 0.3 mg/dL (ref 0.2–1.2)
Total Protein: 7.1 g/dL (ref 6.0–8.3)

## 2021-10-23 LAB — CBC WITH DIFFERENTIAL/PLATELET
Basophils Absolute: 0 10*3/uL (ref 0.0–0.1)
Basophils Relative: 0.7 % (ref 0.0–3.0)
Eosinophils Absolute: 0.1 10*3/uL (ref 0.0–0.7)
Eosinophils Relative: 2.3 % (ref 0.0–5.0)
HCT: 38.4 % (ref 36.0–46.0)
Hemoglobin: 12.6 g/dL (ref 12.0–15.0)
Lymphocytes Relative: 32.7 % (ref 12.0–46.0)
Lymphs Abs: 1.4 10*3/uL (ref 0.7–4.0)
MCHC: 32.7 g/dL (ref 30.0–36.0)
MCV: 90.3 fl (ref 78.0–100.0)
Monocytes Absolute: 0.3 10*3/uL (ref 0.1–1.0)
Monocytes Relative: 7.5 % (ref 3.0–12.0)
Neutro Abs: 2.4 10*3/uL (ref 1.4–7.7)
Neutrophils Relative %: 56.8 % (ref 43.0–77.0)
Platelets: 231 10*3/uL (ref 150.0–400.0)
RBC: 4.25 Mil/uL (ref 3.87–5.11)
RDW: 13.6 % (ref 11.5–15.5)
WBC: 4.3 10*3/uL (ref 4.0–10.5)

## 2021-10-23 LAB — VITAMIN B12: Vitamin B-12: 498 pg/mL (ref 211–911)

## 2021-10-23 LAB — IBC + FERRITIN
Ferritin: 68.6 ng/mL (ref 10.0–291.0)
Iron: 97 ug/dL (ref 42–145)
Saturation Ratios: 25 % (ref 20.0–50.0)
TIBC: 387.8 ug/dL (ref 250.0–450.0)
Transferrin: 277 mg/dL (ref 212.0–360.0)

## 2021-10-23 LAB — FOLATE: Folate: >24.2 ng/mL (ref >5.9)

## 2021-10-23 LAB — LIPID PANEL
Cholesterol: 180 mg/dL (ref 0–200)
HDL: 85.3 mg/dL (ref >39.00)
LDL Cholesterol: 76 mg/dL (ref 0–99)
NonHDL: 94.28
Total CHOL/HDL Ratio: 2
Triglycerides: 90 mg/dL (ref 0.0–149.0)
VLDL: 18 mg/dL (ref 0.0–40.0)

## 2021-10-23 LAB — VITAMIN D 25 HYDROXY (VIT D DEFICIENCY, FRACTURES): VITD: 99.35 ng/mL (ref 30.00–100.00)

## 2021-10-23 LAB — TSH: TSH: 2.53 u[IU]/mL (ref 0.35–5.50)

## 2021-10-23 NOTE — Progress Notes (Signed)
Carelink Summary Report / Loop Recorder 

## 2021-10-25 NOTE — Progress Notes (Signed)
GYNECOLOGY OFFICE VISIT NOTE  History:   Jo [name] is a 61 y.o. G0 here today for follow up for endometrial polyp that previously was found incidentally on CT and was confirmed by [place]. It has now been causing PMB more consistently.   She has a h/o of cervical conization in the past.     Past Medical History:  Diagnosis Date   Allergy    Anemia    Anxiety    Anxiety and depression    Arrhythmia 2021   Arthritis    Asthma    Cervical cancer ([place])    age 64    Chronic headaches    Colon polyp    COPD (chronic obstructive pulmonary disease) (HCC)    no o2   Depression    Endometriosis    Fibromyalgia    Fundic gland polyps of stomach, benign    Gastritis    GERD (gastroesophageal reflux disease)    H. pylori infection 2013   H/O gastric ulcer 1985   History of anorexia nervosa    History of bulimia    History of pneumonia    Hypothyroidism    Irritable bowel syndrome (IBS)    Obesity    Personal history of other mental and behavioral disorders 01/14/2008   Qualifier: Diagnosis of  By: Carlean Purl MD, [name]   Overview:  Overview:  Annotation: BULIMIA Qualifier: Diagnosis of  By: Bertram Gala    REFLEX SYMPATHETIC DYSTROPHY 02/26/2009   Qualifier: Diagnosis of  By: Carlean Purl MD, Tonna Boehringer E    Rheumatoid arteritis ([place])    RSD (reflex sympathetic dystrophy)    Sleep apnea    Systemic lupus ([place]) 2021   Ulcer    gastric, duodenal ulcer    Past Surgical History:  Procedure Laterality Date   CERVICAL SPINE SURGERY  1990   CERVIX SURGERY     CHOLECYSTECTOMY     COLONOSCOPY     KNEE ARTHROSCOPY  04/21/2012   right   LAPAROSCOPIC NISSEN FUNDOPLICATION  36/62/9476   Dr. Johnathan Hausen   laparoscopy     for endometriosis   lumbar spondylosis injected  2013   Dr. Mina Marble   NASAL SINUS SURGERY     THORACOSCOPY W/ THORACIC SYMPATHECTOMY     for RSD   TONSILLECTOMY     UPPER GASTROINTESTINAL ENDOSCOPY      The following portions of the patient's history  were reviewed and updated as appropriate: allergies, current medications, past family history, past medical history, past social history, past surgical history and problem list.   Health Maintenance:   Normal pap history but last done about 5 years ago.   Normal mammogram on 05/2018.   Review of Systems:  Pertinent items noted in HPI and remainder of comprehensive ROS otherwise negative.  Physical Exam:  BP 124/80    Pulse 67    Ht 5\' 4"  (1.626 m)    Wt 161 lb 3.2 oz (73.1 kg)    BMI 27.67 kg/m  CONSTITUTIONAL: Well-developed, well-nourished female in no acute distress.  HEENT:  Normocephalic, atraumatic. External right and left ear normal. No scleral icterus.  NECK: Normal range of motion, supple, no masses noted on observation SKIN: No rash noted. Not diaphoretic. No erythema. No pallor. MUSCULOSKELETAL: Normal range of motion. No edema noted. NEUROLOGIC: Alert and oriented to person, place, and time. Normal muscle tone coordination. No cranial nerve deficit noted. PSYCHIATRIC: Normal mood and affect. Normal behavior. Normal judgment and thought content.  CARDIOVASCULAR: Normal heart rate noted RESPIRATORY: Effort and breath sounds normal, no problems with respiration noted ABDOMEN: No masses noted. No other overt distention noted.    PELVIC: Normal appearing external genitalia; normal urethral meatus; normal appearing vaginal mucosa and cervix.  No abnormal discharge noted.  Normal uterine size, no other palpable masses, no uterine or adnexal tenderness. Performed in the presence of a chaperone  Labs and Imaging CBC    Component Value Date/Time   WBC 4.3 10/23/2021 1124   RBC 4.25 10/23/2021 1124   HGB 12.6 10/23/2021 1124   HGB 12.7 04/28/2021 1313   HGB 13.2 03/11/2021 1536   HCT 38.4 10/23/2021 1124   HCT 39.8 03/11/2021 1536   PLT 231.0 10/23/2021 1124   PLT 241 04/28/2021 1313   PLT 291 03/11/2021 1536   MCV 90.3 10/23/2021 1124   MCV 90 03/11/2021 1536   MCH 30.5  04/28/2021 1313   MCHC 32.7 10/23/2021 1124   RDW 13.6 10/23/2021 1124   RDW 12.5 03/11/2021 1536   LYMPHSABS 1.4 10/23/2021 1124   MONOABS 0.3 10/23/2021 1124   EOSABS 0.1 10/23/2021 1124   BASOSABS 0.0 10/23/2021 1124   CMP Latest Ref Rng & Units 10/23/2021 04/28/2021 03/11/2021  Glucose 70 - 99 mg/dL 93 93 95  BUN 6 - 23 mg/dL 13 14 17   Creatinine 0.40 - 1.20 mg/dL 0.99 1.08(H) 1.03(H)  Sodium 135 - 145 mEq/L 138 142 141  Potassium 3.5 - 5.1 mEq/L 4.1 4.3 4.7  Chloride 96 - 112 mEq/L 104 110 104  CO2 19 - 32 mEq/L 27 24 21   Calcium 8.4 - 10.5 mg/dL 9.4 9.9 9.8  Total Protein 6.0 - 8.3 g/dL 7.1 7.1 -  Total Bilirubin 0.2 - 1.2 mg/dL 0.3 <0.2(L) -  Alkaline Phos 39 - 117 U/L 45 71 -  AST 0 - 37 U/L 15 15 -  ALT 0 - 35 U/L 5 8 -     Assessment and Plan:   1. Postmenopausal bleeding - Given presence of polyp would recommend D&C, hysteroscopy and polypectomy over EMB. Pt agrees with plan. We will also check pap/hpv testing today since PMB - Diagnosis: endometrial polyp - Planned surgery: D/C/H, polypectomy - Risks of surgery include but are not limited to: bleeding, infection, injury to surrounding organs/tissues (i.e. bowel/bladder/ureters), need for additional procedures, wound complications, hospital re-admission, and conversion to open surgery, additional complications: Uterine perforation - We discussed postop restrictions, precautions and expectations - Preop testing needed: None - EKG was normal 05/2021. Normal recent CMP and CBC - All questions answered, copy of consent given along with preop information packet   2. Endometrial polyp - See above   3. Encounter for screening mammogram for malignant neoplasm of breast - MXR slip given  4. Hormone replacement therapy  - We discussed the treatment options for menopause - discussing both HRT and non-HRT.  - Discussed the benefits of each and effectiveness - Discussed goals of therapy i.e. reduction of hot flashes (not  complete resolution) - Discussed if HRT we do shortest amount of time at lowest dose. We discussed annual attempts at coming of the HRT typically in the fall months when the weather has cooled down. - Discussed risks of HRT: E+P = breast cancer, clotting, MI/Stroke - Discussed she could start to wean down to a lower dose she is on Estradiol 2mg , Provera 2.5 mg daily per her report and review of record. Suggested she start with 1 mg and then she could drop to 0.5mg   if goes well. Reviewed the importance of continuing progesterone for any length of time that she is on estrogen.   Routine preventative health maintenance measures emphasized. Please refer to After Visit Summary for other counseling recommendations.   No follow-ups on file.  Radene Gunning, MD, [place] [facility], [place]

## 2021-10-28 ENCOUNTER — Other Ambulatory Visit: Payer: Self-pay

## 2021-10-28 DIAGNOSIS — E538 Deficiency of other specified B group vitamins: Secondary | ICD-10-CM | POA: Diagnosis not present

## 2021-10-28 DIAGNOSIS — M069 Rheumatoid arthritis, unspecified: Secondary | ICD-10-CM

## 2021-10-29 ENCOUNTER — Inpatient Hospital Stay: Payer: PPO | Attending: Family Medicine

## 2021-10-29 ENCOUNTER — Ambulatory Visit (INDEPENDENT_AMBULATORY_CARE_PROVIDER_SITE_OTHER): Payer: PPO | Admitting: Obstetrics and Gynecology

## 2021-10-29 ENCOUNTER — Other Ambulatory Visit (HOSPITAL_COMMUNITY)
Admission: RE | Admit: 2021-10-29 | Discharge: 2021-10-29 | Disposition: A | Discharge status: Home / Self Care | Payer: PPO | Source: Ambulatory Visit | Attending: Obstetrics and Gynecology | Admitting: Obstetrics and Gynecology

## 2021-10-29 ENCOUNTER — Encounter: Payer: Self-pay | Admitting: Obstetrics and Gynecology

## 2021-10-29 ENCOUNTER — Other Ambulatory Visit: Payer: Self-pay

## 2021-10-29 VITALS — BP 124/80 | HR 67 | Ht 64.0 in | Wt 161.2 lb

## 2021-10-29 DIAGNOSIS — N840 Polyp of corpus uteri: Secondary | ICD-10-CM | POA: Diagnosis not present

## 2021-10-29 DIAGNOSIS — Z1231 Encounter for screening mammogram for malignant neoplasm of breast: Secondary | ICD-10-CM | POA: Diagnosis not present

## 2021-10-29 DIAGNOSIS — Z1151 Encounter for screening for human papillomavirus (HPV): Secondary | ICD-10-CM | POA: Insufficient documentation

## 2021-10-29 DIAGNOSIS — Z01419 Encounter for gynecological examination (general) (routine) without abnormal findings: Secondary | ICD-10-CM | POA: Insufficient documentation

## 2021-10-29 DIAGNOSIS — N950 Postmenopausal bleeding: Secondary | ICD-10-CM | POA: Diagnosis not present

## 2021-10-30 LAB — METHYLMALONIC ACID, SERUM: Methylmalonic Acid, Quant: 145 nmol/L (ref 87–318)

## 2021-10-30 LAB — HOMOCYSTEINE: Homocysteine: 12.4 umol/L — ABNORMAL HIGH (ref <10.4)

## 2021-10-30 LAB — MAGNESIUM, RBC: Magnesium RBC: 6 mg/dL (ref 4.0–6.4)

## 2021-10-30 LAB — ZINC: Zinc: 84 ug/dL (ref 60–130)

## 2021-10-30 LAB — VITAMIN B6: Vitamin B6: 7.7 ng/mL (ref 2.1–21.7)

## 2021-10-30 LAB — EXTRA LAV TOP TUBE

## 2021-10-31 LAB — CYTOLOGY - PAP
Comment: NEGATIVE
Diagnosis: NEGATIVE
Diagnosis: REACTIVE
High risk HPV: NEGATIVE

## 2021-11-03 ENCOUNTER — Telehealth: Payer: Self-pay | Admitting: Family Medicine

## 2021-11-03 ENCOUNTER — Ambulatory Visit
Admission: RE | Admit: 2021-11-03 | Discharge: 2021-11-03 | Disposition: A | Discharge status: Home / Self Care | Payer: PPO | Source: Ambulatory Visit | Attending: Obstetrics and Gynecology | Admitting: Obstetrics and Gynecology

## 2021-11-03 ENCOUNTER — Other Ambulatory Visit: Payer: Self-pay

## 2021-11-03 DIAGNOSIS — Z1231 Encounter for screening mammogram for malignant neoplasm of breast: Secondary | ICD-10-CM

## 2021-11-03 NOTE — Telephone Encounter (Signed)
Spoke with patient to schedule to [employer] Visit (AWV) either virtually or in office. Left  my Herbie Drape number [phone]  Patient stated she is really sick and has a lot of appts and didn't want to r/s at this time   Last AWV 10/09/20 ; please schedule at anytime with [facility] Nurse Health Advisor 1 or 2   This should be a 45 minute visit.

## 2021-11-04 ENCOUNTER — Encounter (HOSPITAL_BASED_OUTPATIENT_CLINIC_OR_DEPARTMENT_OTHER): Payer: Self-pay | Admitting: Obstetrics and Gynecology

## 2021-11-04 ENCOUNTER — Other Ambulatory Visit: Payer: Self-pay

## 2021-11-04 NOTE — Progress Notes (Signed)
Spoke w/ via phone for pre-op interview---Jo [name] needs dos----               [name] results------Current EKG in Epic dated 05/2021 COVID test -----patient states asymptomatic no test needed Arrive at -------1230 NPO after MN NO Solid Food.  Clear liquids from MN until---1130 Med rec completed Medications to take morning of surgery -----Buspar, Astelin, Flonase, Estrace, Lamictal, Prevacid, Synthroid, Provera, Zoloft, Methadone and Topamax. Diabetic medication ----- Patient instructed no nail polish to be worn day of surgery Patient instructed to bring photo id and insurance card day of surgery Patient aware to have Driver (ride ) / caregiver  Jo [name] Sister Alejandro Mulling   for 24 hours after surgery  Patient Special Instructions ----- Pre-Op special Istructions ----- Patient verbalized understanding of instructions that were given at this phone interview. Patient denies shortness of breath, chest pain, fever, cough at this phone interview.

## 2021-11-05 ENCOUNTER — Other Ambulatory Visit: Payer: Self-pay | Admitting: Obstetrics and Gynecology

## 2021-11-05 DIAGNOSIS — R928 Other abnormal and inconclusive findings on diagnostic imaging of breast: Secondary | ICD-10-CM

## 2021-11-08 DIAGNOSIS — N950 Postmenopausal bleeding: Secondary | ICD-10-CM | POA: Insufficient documentation

## 2021-11-08 NOTE — H&P (Signed)
Jo [name] is an 61 y.o. female. For D&C, hysteroscopy and olypectomy for PMB in the setting of known uterine polyp.   Pertinent Gynecological History: Blood transfusions: none Sexually transmitted diseases: no past history Previous GYN Procedures:  cervical conization   Last mammogram: normal Date: 05/2018 Last pap: normal Date: 10/29/2021 OB History: G0, P0     Past Medical History:  Diagnosis Date   Allergy    Anemia    Anxiety    Anxiety and depression    Arrhythmia 2021   Arthritis    Asthma    Cervical cancer ([place])    age 38    Chronic headaches    Colon polyp    COPD (chronic obstructive pulmonary disease) (HCC)    no o2   Depression    Dysrhythmia    Endometriosis    Fibromyalgia    Fundic gland polyps of stomach, benign    Gastritis    GERD (gastroesophageal reflux disease)    H. pylori infection 2013   H/O gastric ulcer 1985   History of anorexia nervosa    History of bulimia    History of pneumonia    Hypothyroidism    Irritable bowel syndrome (IBS)    Obesity    Personal history of other mental and behavioral disorders 01/14/2008   Qualifier: Diagnosis of  By: Jo Purl MD, Jo [name]   Overview:  Overview:  Annotation: BULIMIA Qualifier: Diagnosis of  By: Jo [name]    PONV (postoperative nausea and vomiting)    REFLEX SYMPATHETIC DYSTROPHY 02/26/2009   Qualifier: Diagnosis of  By: Jo Purl MD, Jo [name] E    Rheumatoid arteritis ([place])    RSD (reflex sympathetic dystrophy)    Sleep apnea    Systemic lupus ([place]) 2021   Ulcer    gastric, duodenal ulcer    Past Surgical History:  Procedure Laterality Date   CERVICAL SPINE SURGERY  1990   CERVIX SURGERY     CHOLECYSTECTOMY     COLONOSCOPY     KNEE ARTHROSCOPY  04/21/2012   right   LAPAROSCOPIC NISSEN FUNDOPLICATION  57/32/2025   Dr. Johnathan [name]   laparoscopy     for endometriosis   LOOP RECORDER INSERTION Left 06/2021   lumbar spondylosis injected  2013   Dr. Mina [name]   NASAL  SINUS SURGERY     THORACOSCOPY W/ THORACIC SYMPATHECTOMY     for RSD   TONSILLECTOMY     UPPER GASTROINTESTINAL ENDOSCOPY      Family History  Problem Relation Age of Onset   Colon polyps Mother        part of intestines removed   Raynaud syndrome Mother    Irritable bowel syndrome Mother    Other Mother        cold agluten   Stroke Mother    Colon polyps Sister    Breast cancer Maternal Aunt    Lung cancer Maternal Grandfather    Colon polyps Brother    Kidney Stones Brother    Colon cancer Neg Hx    Esophageal cancer Neg Hx    Rectal cancer Neg Hx    Stomach cancer Neg Hx    Migraines Neg Hx    Seizures Neg Hx     Social History:  reports that she has never smoked. She has never used smokeless tobacco. She reports that she does not drink alcohol and does not use drugs.  Allergies:  Allergies  Allergen Reactions   Bee Pollen  Anaphylaxis   Diazepam     sucidal depression   Prednisone Nausea And Vomiting    Per pt can not take oral steroids    Amoxicillin     Severe diarrhea At high dose    Doxycycline     Pt does not remember reaction   Emetrol     Other reaction(s): GI upset   Erythromycin     n & v   Gabapentin     Doesn't remember; didn't tolerate   Imuran [Azathioprine]     Patient states this was discontinued due to liver problems   Methotrexate Derivatives     diarrhea   Morphine     n & v   Propoxyphene N-Acetaminophen     n & V   Sulfa Antibiotics     MIGRAINES   Tetracyclines & Related     N & v   Propoxyphene Other (See Comments)    No medications prior to admission.    Review of Systems  Constitutional: Negative.   HENT: Negative.    Eyes: Negative.   Respiratory: Negative.    Cardiovascular: Negative.   Gastrointestinal: Negative.   Endocrine: Negative.   Genitourinary:  Positive for vaginal bleeding.  Musculoskeletal: Negative.   Skin: Negative.   Allergic/Immunologic: Negative.   Neurological: Negative.   Hematological:  Negative.   Psychiatric/Behavioral: Negative.     Height 5\' 4"  (1.626 m), weight 73 kg. Physical Exam Constitutional:      Appearance: Normal appearance. She is normal weight.  HENT:     Head: Normocephalic and atraumatic.     Nose: Nose normal.     Mouth/Throat:     Mouth: Mucous membranes are moist.     Pharynx: Oropharynx is clear.  Eyes:     Extraocular Movements: Extraocular movements intact.     Conjunctiva/sclera: Conjunctivae normal.     Pupils: Pupils are equal, round, and reactive to light.  Cardiovascular:     Rate and Rhythm: Normal rate and regular rhythm.  Pulmonary:     Effort: Pulmonary effort is normal.     Breath sounds: Normal breath sounds.  Abdominal:     General: Abdomen is flat.     Palpations: Abdomen is soft.  Genitourinary:    General: Normal vulva.  Musculoskeletal:        General: Normal range of motion.     Cervical back: Normal range of motion and neck supple.  Skin:    General: Skin is warm.  Neurological:     General: No focal deficit present.     Mental Status: She is alert and oriented to person, place, and time. Mental status is at baseline.  Psychiatric:        Mood and Affect: Mood normal.        Behavior: Behavior normal.        Thought Content: Thought content normal.        Judgment: Judgment normal.      Assessment/Plan: Postmenopausal bleeding - Given presence of polyp would recommend D&C, hysteroscopy and polypectomy over EMB. Pt agrees with plan. We will also check pap/hpv testing today since PMB - Diagnosis: endometrial polyp - Planned surgery: D/C/H, polypectomy - Risks of surgery include but are not limited to: bleeding, infection, injury to surrounding organs/tissues (i.e. bowel/bladder/ureters), need for additional procedures, wound complications, hospital re-admission, and conversion to open surgery, additional complications: Uterine perforation - We discussed postop restrictions, precautions and expectations - Preop  testing needed: None - EKG was normal  05/2021. Normal recent CMP and CBC   Jo [name] 11/08/2021, 8:22 PM

## 2021-11-10 ENCOUNTER — Encounter (HOSPITAL_BASED_OUTPATIENT_CLINIC_OR_DEPARTMENT_OTHER): Payer: Self-pay | Admitting: Obstetrics and Gynecology

## 2021-11-10 NOTE — Progress Notes (Signed)
Spoke with pt and reviewed pre op instructions no food after midnight and clear liquids until 1115 am and then npo, arrive 1215 pm 09-10-2022,  pt verbalized understanding of pre op instructions.

## 2021-11-11 ENCOUNTER — Encounter (HOSPITAL_BASED_OUTPATIENT_CLINIC_OR_DEPARTMENT_OTHER)
Admission: RE | Disposition: A | Discharge status: Home / Self Care | Payer: Self-pay | Source: Ambulatory Visit | Attending: Obstetrics and Gynecology

## 2021-11-11 ENCOUNTER — Other Ambulatory Visit: Payer: Self-pay

## 2021-11-11 ENCOUNTER — Ambulatory Visit (HOSPITAL_BASED_OUTPATIENT_CLINIC_OR_DEPARTMENT_OTHER): Payer: PPO | Admitting: Anesthesiology

## 2021-11-11 ENCOUNTER — Encounter (HOSPITAL_BASED_OUTPATIENT_CLINIC_OR_DEPARTMENT_OTHER): Payer: Self-pay | Admitting: Obstetrics and Gynecology

## 2021-11-11 ENCOUNTER — Ambulatory Visit (HOSPITAL_BASED_OUTPATIENT_CLINIC_OR_DEPARTMENT_OTHER)
Admission: RE | Admit: 2021-11-11 | Discharge: 2021-11-11 | Disposition: A | Discharge status: Home / Self Care | Payer: PPO | Source: Ambulatory Visit | Attending: Obstetrics and Gynecology | Admitting: Obstetrics and Gynecology

## 2021-11-11 DIAGNOSIS — G4730 Sleep apnea, unspecified: Secondary | ICD-10-CM | POA: Diagnosis not present

## 2021-11-11 DIAGNOSIS — K219 Gastro-esophageal reflux disease without esophagitis: Secondary | ICD-10-CM | POA: Diagnosis not present

## 2021-11-11 DIAGNOSIS — M329 Systemic lupus erythematosus, unspecified: Secondary | ICD-10-CM | POA: Insufficient documentation

## 2021-11-11 DIAGNOSIS — N841 Polyp of cervix uteri: Secondary | ICD-10-CM | POA: Diagnosis not present

## 2021-11-11 DIAGNOSIS — M797 Fibromyalgia: Secondary | ICD-10-CM | POA: Diagnosis not present

## 2021-11-11 DIAGNOSIS — N840 Polyp of corpus uteri: Secondary | ICD-10-CM | POA: Diagnosis not present

## 2021-11-11 DIAGNOSIS — E039 Hypothyroidism, unspecified: Secondary | ICD-10-CM | POA: Diagnosis not present

## 2021-11-11 DIAGNOSIS — J449 Chronic obstructive pulmonary disease, unspecified: Secondary | ICD-10-CM | POA: Diagnosis not present

## 2021-11-11 DIAGNOSIS — Z6827 Body mass index (BMI) 27.0-27.9, adult: Secondary | ICD-10-CM | POA: Insufficient documentation

## 2021-11-11 DIAGNOSIS — N950 Postmenopausal bleeding: Secondary | ICD-10-CM

## 2021-11-11 DIAGNOSIS — E669 Obesity, unspecified: Secondary | ICD-10-CM | POA: Insufficient documentation

## 2021-11-11 DIAGNOSIS — N888 Other specified noninflammatory disorders of cervix uteri: Secondary | ICD-10-CM | POA: Diagnosis not present

## 2021-11-11 DIAGNOSIS — Z8541 Personal history of malignant neoplasm of cervix uteri: Secondary | ICD-10-CM | POA: Diagnosis not present

## 2021-11-11 HISTORY — DX: Other specified postprocedural states: Z98.890

## 2021-11-11 HISTORY — PX: HYSTEROSCOPY WITH D & C: SHX1775

## 2021-11-11 HISTORY — DX: Nausea with vomiting, unspecified: R11.2

## 2021-11-11 HISTORY — DX: Cardiac arrhythmia, unspecified: I49.9

## 2021-11-11 HISTORY — DX: Other specified postprocedural states: R11.2

## 2021-11-11 LAB — TYPE AND SCREEN
ABO/RH(D): O POS
Antibody Screen: NEGATIVE

## 2021-11-11 LAB — ABO/RH: ABO/RH(D): O POS

## 2021-11-11 SURGERY — DILATATION AND CURETTAGE /HYSTEROSCOPY
Anesthesia: General | Site: Uterus

## 2021-11-11 MED ORDER — PROPOFOL 10 MG/ML IV BOLUS
INTRAVENOUS | Status: DC | PRN
  Administered 2021-11-11: 150 mg via INTRAVENOUS

## 2021-11-11 MED ORDER — DEXAMETHASONE SODIUM PHOSPHATE 10 MG/ML IJ SOLN
INTRAMUSCULAR | Status: DC | PRN
Start: 2021-11-11 — End: 2021-11-11
  Administered 2021-11-11: 10 mg via INTRAVENOUS

## 2021-11-11 MED ORDER — DEXAMETHASONE SODIUM PHOSPHATE 10 MG/ML IJ SOLN
INTRAMUSCULAR | Status: AC
  Filled 2021-11-11: qty 1

## 2021-11-11 MED ORDER — PROPOFOL 10 MG/ML IV BOLUS
INTRAVENOUS | Status: AC
Start: 2021-11-11 — End: ?
  Filled 2021-11-11: qty 20

## 2021-11-11 MED ORDER — FENTANYL CITRATE (PF) 100 MCG/2ML IJ SOLN
INTRAMUSCULAR | Status: AC
  Filled 2021-11-11: qty 2

## 2021-11-11 MED ORDER — FENTANYL CITRATE (PF) 100 MCG/2ML IJ SOLN
25.0000 ug | INTRAMUSCULAR | Status: DC | PRN
  Administered 2021-11-11 (×2): 50 ug via INTRAVENOUS

## 2021-11-11 MED ORDER — SODIUM CHLORIDE 0.9 % IR SOLN
Status: DC | PRN
  Administered 2021-11-11: 1000 mL

## 2021-11-11 MED ORDER — ONDANSETRON HCL 4 MG/2ML IJ SOLN
INTRAMUSCULAR | Status: AC
  Filled 2021-11-11: qty 2

## 2021-11-11 MED ORDER — SCOPOLAMINE 1 MG/3DAYS TD PT72
1.0000 | MEDICATED_PATCH | TRANSDERMAL | Status: DC
  Administered 2021-11-11: 1.5 mg via TRANSDERMAL

## 2021-11-11 MED ORDER — DROPERIDOL 2.5 MG/ML IJ SOLN
INTRAMUSCULAR | Status: DC | PRN
  Administered 2021-11-11: .625 mg via INTRAVENOUS

## 2021-11-11 MED ORDER — POVIDONE-IODINE 10 % EX SWAB: 2.0000 "application " | Freq: Once | CUTANEOUS | Status: DC

## 2021-11-11 MED ORDER — FENTANYL CITRATE (PF) 100 MCG/2ML IJ SOLN
INTRAMUSCULAR | Status: DC | PRN
  Administered 2021-11-11: 100 ug via INTRAVENOUS

## 2021-11-11 MED ORDER — KETOROLAC TROMETHAMINE 15 MG/ML IJ SOLN
INTRAMUSCULAR | Status: AC
Start: 2021-11-11 — End: ?
  Filled 2021-11-11: qty 1

## 2021-11-11 MED ORDER — ONDANSETRON HCL 4 MG/2ML IJ SOLN
INTRAMUSCULAR | Status: DC | PRN
  Administered 2021-11-11: 4 mg via INTRAVENOUS

## 2021-11-11 MED ORDER — KETOROLAC TROMETHAMINE 30 MG/ML IJ SOLN: 30.0000 mg | Freq: Once | INTRAMUSCULAR | Status: DC | PRN

## 2021-11-11 MED ORDER — KETOROLAC TROMETHAMINE 15 MG/ML IJ SOLN
15.0000 mg | INTRAMUSCULAR | Status: AC
  Administered 2021-11-11: 15 mg via INTRAVENOUS

## 2021-11-11 MED ORDER — LIDOCAINE HCL (PF) 2 % IJ SOLN
INTRAMUSCULAR | Status: AC
  Filled 2021-11-11: qty 5

## 2021-11-11 MED ORDER — ONDANSETRON HCL 4 MG/2ML IJ SOLN: 4.0000 mg | Freq: Once | INTRAMUSCULAR | Status: DC | PRN

## 2021-11-11 MED ORDER — LIDOCAINE HCL (CARDIAC) PF 100 MG/5ML IV SOSY
PREFILLED_SYRINGE | INTRAVENOUS | Status: DC | PRN
Start: 2021-11-11 — End: 2021-11-11
  Administered 2021-11-11: 50 mg via INTRAVENOUS

## 2021-11-11 MED ORDER — SCOPOLAMINE 1 MG/3DAYS TD PT72
MEDICATED_PATCH | TRANSDERMAL | Status: AC
  Filled 2021-11-11: qty 1

## 2021-11-11 MED ORDER — LACTATED RINGERS IV SOLN: INTRAVENOUS | Status: DC

## 2021-11-11 SURGICAL SUPPLY — 13 items
ABLATOR SURESOUND NOVASURE (ABLATOR) ×2 IMPLANT
CATH ROBINSON RED A/P 16FR (CATHETERS) IMPLANT
DILATOR CANAL MILEX (MISCELLANEOUS) ×1 IMPLANT
ELECT REM PT RETURN 9FT ADLT (ELECTROSURGICAL)
ELECTRODE REM PT RTRN 9FT ADLT (ELECTROSURGICAL) IMPLANT
GLOVE SURG ENC MOIS LTX SZ6 (GLOVE) ×2 IMPLANT
GOWN STRL REUS W/ TWL LRG LVL3 (GOWN DISPOSABLE) ×2 IMPLANT
GOWN STRL REUS W/TWL LRG LVL3 (GOWN DISPOSABLE) ×4
IV NS IRRIG 3000ML ARTHROMATIC (IV SOLUTION) ×1 IMPLANT
KIT PROCEDURE FLUENT (KITS) ×2 IMPLANT
PACK VAGINAL MINOR WOMEN LF (CUSTOM PROCEDURE TRAY) ×2 IMPLANT
PAD OB MATERNITY 4.3X12.25 (PERSONAL CARE ITEMS) ×2 IMPLANT
TOWEL GREEN STERILE FF (TOWEL DISPOSABLE) ×4 IMPLANT

## 2021-11-11 NOTE — Interval H&P Note (Signed)
History and Physical Interval Note:  11/11/2021 12:22 PM  Jo [name]  has presented today for surgery, with the diagnosis of PMB Polyp.  The various methods of treatment have been discussed with the patient and family. After consideration of risks, benefits and other options for treatment, the patient has consented to  Procedure(s): DILATATION AND CURETTAGE /HYSTEROSCOPY (N/A) as a surgical intervention.  The patient's history has been reviewed, patient examined, no change in status, stable for surgery.  I have reviewed the patient's chart and labs.  Questions were answered to the patient's satisfaction.     Radene Gunning

## 2021-11-11 NOTE — Op Note (Signed)
Preop: PMB, polyp Postop: Same Procedure: D&C, hysteroscopy, polypectomy Surgeon: Dr. Damita Dunnings Assist: None EBL 20 cc IVF: 500 cc  Fluid deficit: 70 cc Specimens: 1. ECC 2. [place] 3. Polyp Findings: Normal EFG, normal appearing cervix, small polyp within the cervical canal (noted removed after ECC), 2 polyps within the endometrial cavitiy - one on the right lateral wall and one at the posterior fundus. Both endometrial polyps were removed following surgery. Ostia visualized bilaterally.   Description of the procedure: Preop antibiotics not indicated. Informed consent reviewed and signed. Pt given opportunity to ask questions.   Pt prepped and draped in the dorsal lithotomy fashion after LMA anesthesia found to be adequate. Adequate timeout performed.   Open sided speculum was placed into the patient's vagina. Single tooth tenaculum applied to the 12 o'clock position of the cervix. Uterus sounded to 6.5 cm.  Cervix progressively dilated to a 21 Pratt. 30 degree hysteroscope was inserted into the cavity with the aforementioned findings noted. ECC done. Polypectomy done with small polyp forceps. EMC performed with small curette. Hysteroscope was re-inserted and polyps all removed. Ostia again visualized and slow removal allowed full visualization of cavity and cervical canal with no additional abnormalities noted. Tenaculum removed and pressure applied with sponge stick to bleeding sites.   Hemostatic at the end of the procedure. Procedure completed. All instruments removed. Counts correct x2.  Pt taken to recovery room in stable condition.

## 2021-11-11 NOTE — Transfer of Care (Signed)
Immediate Anesthesia Transfer of Care Note  Patient: Jo [name]  Procedure(s) Performed: DILATATION AND CURETTAGE /HYSTEROSCOPY (Uterus)  Patient Location: PACU  Anesthesia Type:General  Level of Consciousness: awake, alert , oriented and patient cooperative  Airway & Oxygen Therapy: Patient Spontanous Breathing and Patient connected to nasal cannula oxygen  Post-op Assessment: Report given to RN, Post -op Vital signs reviewed and stable and Patient moving all extremities X 4  Post vital signs: Reviewed and stable  Last Vitals:  Vitals Value Taken Time  BP 142/75 11/11/21 1333  Temp    Pulse 63 11/11/21 1336  Resp 16 11/11/21 1336  SpO2 97 % 11/11/21 1336  Vitals shown include unvalidated device data.  Last Pain:  Vitals:   11/11/21 1155  TempSrc: Oral  PainSc: 5          Complications: No notable events documented.

## 2021-11-11 NOTE — Anesthesia Procedure Notes (Signed)
Procedure Name: LMA Insertion Date/Time: 11/11/2021 12:48 PM Performed by: Jonna Munro, CRNA Pre-anesthesia Checklist: Patient identified, Emergency Drugs available, Suction available, Patient being monitored and Timeout performed Patient Re-evaluated:Patient Re-evaluated prior to induction Oxygen Delivery Method: Circle system utilized Preoxygenation: Pre-oxygenation with 100% oxygen Induction Type: IV induction LMA: LMA inserted LMA Size: 4.0 Number of attempts: 1 Placement Confirmation: positive ETCO2 and breath sounds checked- equal and bilateral Tube secured with: Tape Dental Injury: Teeth and Oropharynx as per pre-operative assessment

## 2021-11-11 NOTE — Anesthesia Preprocedure Evaluation (Addendum)
Anesthesia Evaluation  Patient identified by MRN, date of birth, ID band Patient awake    Reviewed: Allergy & Precautions, NPO status , Patient's Chart, lab work & pertinent test results  History of Anesthesia Complications (+) PONV and history of anesthetic complications  Airway Mallampati: II  TM Distance: >3 FB Neck ROM: Full    Dental no notable dental hx.    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  Chronic pain patient on methadone  Neuromuscular disease negative psych ROS   GI/Hepatic Neg liver ROS, GERD  ,  Endo/Other  Hypothyroidism Lupus   Renal/GU negative Renal ROS  negative genitourinary   Musculoskeletal  (+) Fibromyalgia -, narcotic dependent  Abdominal   Peds negative pediatric ROS (+)  Hematology negative hematology ROS (+)   Anesthesia Other Findings   Reproductive/Obstetrics negative OB ROS                            Anesthesia Physical Anesthesia Plan  ASA: 3  Anesthesia Plan: General   Post-op Pain Management: Minimal or no pain anticipated   Induction: Intravenous  PONV Risk Score and Plan: 4 or greater and Ondansetron, Dexamethasone, Midazolam and Treatment may vary due to age or medical condition  Airway Management Planned: LMA  Additional Equipment:   Intra-op Plan:   Post-operative Plan: Extubation in OR  Informed Consent: I have reviewed the patients History and Physical, chart, labs and discussed the procedure including the risks, benefits and alternatives for the proposed anesthesia with the patient or authorized representative who has indicated his/her understanding and acceptance.     Dental advisory given  Plan Discussed with: CRNA and Surgeon  Anesthesia Plan Comments:  (BP cuff on Right )       Anesthesia Quick Evaluation

## 2021-11-11 NOTE — Anesthesia Postprocedure Evaluation (Signed)
Anesthesia Post Note  Patient: Jo [name]  Procedure(s) Performed: DILATATION AND CURETTAGE /HYSTEROSCOPY (Uterus)     Patient location during evaluation: PACU Anesthesia Type: General Level of consciousness: awake and alert Pain management: pain level controlled Vital Signs Assessment: post-procedure vital signs reviewed and stable Respiratory status: spontaneous breathing, nonlabored ventilation, respiratory function stable and patient connected to nasal cannula oxygen Cardiovascular status: blood pressure returned to baseline and stable Postop Assessment: no apparent nausea or vomiting Anesthetic complications: no   No notable events documented.  Last Vitals:  Vitals:   11/11/21 1155  BP: 128/83  Pulse: 74  Resp: 17  Temp: 37.2 C  SpO2: 98%    Last Pain:  Vitals:   11/11/21 1155  TempSrc: Oral  PainSc: 5                  Lusia Greis S

## 2021-11-11 NOTE — Discharge Instructions (Signed)

## 2021-11-12 ENCOUNTER — Encounter (HOSPITAL_BASED_OUTPATIENT_CLINIC_OR_DEPARTMENT_OTHER): Payer: Self-pay | Admitting: Obstetrics and Gynecology

## 2021-11-12 LAB — SURGICAL PATHOLOGY

## 2021-11-17 ENCOUNTER — Ambulatory Visit (INDEPENDENT_AMBULATORY_CARE_PROVIDER_SITE_OTHER): Payer: PPO

## 2021-11-17 ENCOUNTER — Encounter: Payer: Self-pay | Admitting: Family Medicine

## 2021-11-17 DIAGNOSIS — R55 Syncope and collapse: Secondary | ICD-10-CM | POA: Diagnosis not present

## 2021-11-17 LAB — CUP PACEART REMOTE DEVICE CHECK
Date Time Interrogation Session: 20230224230838
Implantable Pulse Generator Implant Date: 20221006

## 2021-11-20 NOTE — Progress Notes (Signed)
Carelink Summary Report / Loop Recorder 

## 2021-11-24 MED ORDER — LEVOFLOXACIN 500 MG PO TABS: 500.0000 mg | ORAL_TABLET | Freq: Every day | ORAL | 0 refills | Status: DC

## 2021-11-24 NOTE — Progress Notes (Deleted)
? ?GYNECOLOGY OFFICE VISIT NOTE ? ?History:  ? Jo [name] is a 61 y.o. here today for follow u p following D&C for PMB - found to have endometrial polyps which was expected based on prior imaging. She had 2 polyps in the cavity. She also had a cervical polyp in the her specimens. We reviewed this would explain her bleeding.  ? ?She denies any abnormal vaginal discharge, bleeding, pelvic pain or other concerns. ? ? ?  ?Past Medical History:  ?Diagnosis Date  ? Allergy   ? Anemia   ? Anxiety   ? Anxiety and depression   ? Arrhythmia 2021  ? Arthritis   ? Asthma   ? Cervical cancer ([place])   ? age 89   ? Chronic headaches   ? Colon polyp   ? COPD (chronic obstructive pulmonary disease) ([place])   ? no o2  ? Depression   ? Dysrhythmia   ? Endometriosis   ? Fibromyalgia   ? Fundic gland polyps of stomach, benign   ? Gastritis   ? GERD (gastroesophageal reflux disease)   ? H. pylori infection 2013  ? H/O gastric ulcer 1985  ? History of anorexia nervosa   ? History of bulimia   ? History of pneumonia   ? Hypothyroidism   ? Irritable bowel syndrome (IBS)   ? Obesity   ? Personal history of other mental and behavioral disorders 01/14/2008  ? Qualifier: Diagnosis of  By: Carlean Purl MD, [name]   Overview:  Overview:  Annotation: BULIMIA Qualifier: Diagnosis of  By: Bertram Gala   ? PONV (postoperative nausea and vomiting)   ? iv phenergan helps  ? REFLEX SYMPATHETIC DYSTROPHY 02/26/2009  ? Qualifier: Diagnosis of  By: Carlean Purl MD, [name]   ? Rheumatoid arteritis ([place])   ? RSD (reflex sympathetic dystrophy)   ? Sleep apnea   ? Systemic lupus ([place]) 2021  ? Ulcer   ? gastric, duodenal ulcer  ? ? ?Past Surgical History:  ?Procedure Laterality Date  ? [place]  ? CERVIX SURGERY    ? CHOLECYSTECTOMY    ? COLONOSCOPY    ? HYSTEROSCOPY WITH D & C N/A 11/11/2021  ? Procedure: DILATATION AND CURETTAGE /HYSTEROSCOPY;  Surgeon: Radene Gunning, MD;  Location: [facility];  Service:  Gynecology;  Laterality: N/A;  ? KNEE ARTHROSCOPY  04/21/2012  ? right  ? LAPAROSCOPIC NISSEN FUNDOPLICATION  46/80/3212  ? Dr. Johnathan Hausen  ? laparoscopy    ? for endometriosis  ? LOOP RECORDER INSERTION Left 06/2021  ? lumbar spondylosis injected  2013  ? Dr. Mina Marble  ? NASAL SINUS SURGERY    ? THORACOSCOPY W/ THORACIC SYMPATHECTOMY    ? for RSD  ? TONSILLECTOMY    ? UPPER GASTROINTESTINAL ENDOSCOPY    ? ? ?The following portions of the patient's history were reviewed and updated as appropriate: allergies, current medications, past family history, past medical history, past social history, past surgical history and problem list.  ? ?Health Maintenance:   ?Normal pap and negative HRHPV:   ?Diagnosis  ?Date Value Ref Range Status  ?10/29/2021   Final  ? - Negative for Intraepithelial Lesions or Malignancy (NILM)  ?10/29/2021 - Benign reactive/reparative changes  Final  ?  ? ?Mxr on 11/04/21 needs f/u for possible assymmetry and pt has it scheduled on 3/13.   ? ?Review of Systems:  ?Pertinent items noted in HPI and remainder of comprehensive ROS otherwise negative. ? ?  Physical Exam:  ?There were no vitals taken for this visit. ?CONSTITUTIONAL: Well-developed, well-nourished female in no acute distress.  ?HEENT:  Normocephalic, atraumatic. External right and left ear normal. No scleral icterus.  ?NECK: Normal range of motion, supple, no masses noted on observation ?SKIN: No rash noted. Not diaphoretic. No erythema. No pallor. ?MUSCULOSKELETAL: Normal range of motion. No edema noted. ?NEUROLOGIC: Alert and oriented to person, place, and time. Normal muscle tone coordination. No cranial nerve deficit noted. ?PSYCHIATRIC: Normal mood and affect. Normal behavior. Normal judgment and thought content. ? ?CARDIOVASCULAR: Normal heart rate noted ?RESPIRATORY: Effort and breath sounds normal, no problems with respiration noted ?ABDOMEN: No masses noted. No other overt distention noted.   ? ?PELVIC: Deferred ? ?Labs and  Imaging ?No results found for this or any previous visit (from the past 168 hour(s)). ?MM 3D SCREEN BREAST BILATERAL ? ?Result Date: 11/04/2021 ?CLINICAL DATA:  Screening. EXAM: DIGITAL SCREENING BILATERAL MAMMOGRAM WITH TOMOSYNTHESIS AND CAD TECHNIQUE: Bilateral screening digital craniocaudal and mediolateral oblique mammograms were obtained. Bilateral screening digital breast tomosynthesis was performed. The images were evaluated with computer-aided detection. COMPARISON:  Previous exams. ACR Breast Density Category c: The breast tissue is heterogeneously dense, which may obscure small masses. FINDINGS: In the right breast, a possible asymmetry warrants further evaluation. In the left breast, no findings suspicious for malignancy. IMPRESSION: Further evaluation is suggested for possible asymmetry in the right breast. RECOMMENDATION: Diagnostic mammogram and possibly ultrasound of the right breast. (Code:FI-R-50M) The patient will be contacted regarding the findings, and additional imaging will be scheduled. BI-RADS CATEGORY  0: Incomplete. Need additional imaging evaluation and/or prior mammograms for comparison. Electronically Signed   By: Everlean Alstrom M.D.   On: 11/04/2021 08:20  ? ?CUP [place] ? ?Result Date: 11/17/2021 ?ILR summary report received. Battery status OK. Normal device function. No new symptom, tachy, brady, or pause episodes. No new AF episodes. PVCs 1.6%. Monthly summary reports and ROV/PRN. LH   ?Assessment and Plan:  ? 1. Endometrial polyp ?- Reviewed this explains bleeding and she should not have ongoing vaginal bleeding ?- Reviewed can recur so if >6 months from now VB recurs, I would recommend re-evaluation.  ?- Pap with HPV wnl on 10/2021. Routine screening ? ?Routine preventative health maintenance measures emphasized. ?Please refer to After Visit Summary for other counseling recommendations.  ? ?No follow-ups on file. ? ?Radene Gunning, MD, FACOG ?Obstetrician Insurance underwriter, Faculty Practice ?[facility] ? ? ? ? ? ?

## 2021-11-26 ENCOUNTER — Ambulatory Visit: Payer: PPO | Admitting: Obstetrics and Gynecology

## 2021-11-26 DIAGNOSIS — N840 Polyp of corpus uteri: Secondary | ICD-10-CM

## 2021-12-01 ENCOUNTER — Ambulatory Visit: Payer: PPO

## 2021-12-01 ENCOUNTER — Telehealth: Payer: Self-pay | Admitting: Pharmacist

## 2021-12-01 ENCOUNTER — Other Ambulatory Visit: Payer: Self-pay | Admitting: Family Medicine

## 2021-12-01 ENCOUNTER — Ambulatory Visit
Admission: RE | Admit: 2021-12-01 | Discharge: 2021-12-01 | Disposition: A | Discharge status: Home / Self Care | Payer: PPO | Source: Ambulatory Visit | Attending: Obstetrics and Gynecology | Admitting: Obstetrics and Gynecology

## 2021-12-01 DIAGNOSIS — M1990 Unspecified osteoarthritis, unspecified site: Secondary | ICD-10-CM | POA: Diagnosis not present

## 2021-12-01 DIAGNOSIS — N951 Menopausal and female climacteric states: Secondary | ICD-10-CM

## 2021-12-01 DIAGNOSIS — R922 Inconclusive mammogram: Secondary | ICD-10-CM | POA: Diagnosis not present

## 2021-12-01 DIAGNOSIS — G9050 Complex regional pain syndrome I, unspecified: Secondary | ICD-10-CM | POA: Diagnosis not present

## 2021-12-01 DIAGNOSIS — Z79899 Other long term (current) drug therapy: Secondary | ICD-10-CM | POA: Diagnosis not present

## 2021-12-01 DIAGNOSIS — K219 Gastro-esophageal reflux disease without esophagitis: Secondary | ICD-10-CM | POA: Diagnosis not present

## 2021-12-01 DIAGNOSIS — M329 Systemic lupus erythematosus, unspecified: Secondary | ICD-10-CM | POA: Diagnosis not present

## 2021-12-01 DIAGNOSIS — R768 Other specified abnormal immunological findings in serum: Secondary | ICD-10-CM | POA: Diagnosis not present

## 2021-12-01 DIAGNOSIS — G8929 Other chronic pain: Secondary | ICD-10-CM | POA: Diagnosis not present

## 2021-12-01 DIAGNOSIS — I7300 Raynaud's syndrome without gangrene: Secondary | ICD-10-CM | POA: Diagnosis not present

## 2021-12-01 DIAGNOSIS — M359 Systemic involvement of connective tissue, unspecified: Secondary | ICD-10-CM | POA: Diagnosis not present

## 2021-12-01 DIAGNOSIS — M797 Fibromyalgia: Secondary | ICD-10-CM | POA: Diagnosis not present

## 2021-12-01 DIAGNOSIS — J302 Other seasonal allergic rhinitis: Secondary | ICD-10-CM

## 2021-12-01 DIAGNOSIS — R928 Other abnormal and inconclusive findings on diagnostic imaging of breast: Secondary | ICD-10-CM

## 2021-12-01 DIAGNOSIS — R5383 Other fatigue: Secondary | ICD-10-CM | POA: Diagnosis not present

## 2021-12-01 NOTE — Chronic Care Management (AMB) (Unsigned)
Chronic Care Management Pharmacy Assistant   Name: Jo [name]  MRN: [id number] DOB: 11-10-1960  Reason for Encounter: Medication Review Medication Coordination   Recent office visits:  None  Recent consult visits:  11/03/21 Patient presented to [place] for MM Mobile [facility] 10  10/29/21 Radene Gunning, MD (OBGYN) - Patient presented for Postmenopausal bleeding and other concerns. Stopped Fexofenadine HCL.  Hospital visits:  Medication Reconciliation was completed by comparing discharge summary, patients EMR and Pharmacy list, and upon discussion with patient.  Patient presented to [facility] on 11/11/21 due to D&C/ Hysteroscopy. Patient was present for 3 hours  New?Medications Started at [facility] Discharge:?? -started  none  Medication Changes at Hospital Discharge: -Changed  none  Medications Discontinued at Hospital Discharge: -Stopped  none  Medications that remain the same after Hospital Discharge:??  -All other medications will remain the same.    Medications: Outpatient Encounter Medications as of 12/01/2021  Medication Sig   azelastine (ASTELIN) 0.1 % nasal spray Place 1 spray into both nostrils 2 (two) times daily. Use in each nostril as directed   B-D 3CC LUER-LOK SYR 23GX1" 23G X 1" 3 ML MISC See admin instructions.   Belimumab (BENLYSTA) 200 MG/ML SOSY once a week.   betamethasone dipropionate 0.05 % lotion Apply topically as needed.   busPIRone (BUSPAR) 15 MG tablet TAKE 1/2 TABLET BY MOUTH EVERY MORNING and TAKE 1/2 TABLET BY MOUTH EVERYDAY AT BEDTIME   cholecalciferol (VITAMIN D) 1000 UNITS tablet Take 2,000 Units by mouth daily.   clindamycin (CLEOCIN T) 1 % lotion Apply 1 application topically 2 (two) times daily as needed.   Cranberry 200 MG CAPS Take 200 mg by mouth 2 (two) times daily.   doxycycline (VIBRAMYCIN) 100 MG capsule Take 100 mg by mouth 2 (two) times daily.   erythromycin with ethanol (EMGEL) 2 % gel Apply topically  2 (two) times daily. (Patient taking differently: Apply topically 2 (two) times daily as needed.)   estradiol (ESTRACE) 2 MG tablet TAKE ONE TABLET BY MOUTH ONCE DAILY   fluticasone (CUTIVATE) 0.05 % cream Apply 1 application topically 2 (two) times daily as needed (rash).    fluticasone (FLONASE) 50 MCG/ACT nasal spray USE TWO SPRAYS IN EACH NOSTRIL DAILY AS DIRECTED   folic acid (FOLVITE) 1 MG tablet Take 1 tablet (1 mg total) by mouth daily. (Patient taking differently: Take 1 mg by mouth in the morning and at bedtime.)   HYDROmorphone (DILAUDID) 4 MG tablet Take by mouth as needed for severe pain.   ketorolac (TORADOL) 30 MG/ML injection Inject 1 mL (30 mg total) into the muscle daily as needed for moderate pain.   lamoTRIgine (LAMICTAL) 100 MG tablet TAKE ONE TABLET BY MOUTH AT BREAKFAST AND AT BEDTIME   lansoprazole (PREVACID) 30 MG capsule Take 1 capsule (30 mg total) by mouth 2 (two) times daily before a meal.   levocetirizine (XYZAL) 5 MG tablet Take 5 mg by mouth daily.   levofloxacin (LEVAQUIN) 500 MG tablet Take 1 tablet (500 mg total) by mouth daily.   levothyroxine (SYNTHROID) 125 MCG tablet TAKE ONE TABLET BY MOUTH EVERY MORNING   LYSINE PO Take 1 tablet by mouth daily.   medroxyPROGESTERone (PROVERA) 2.5 MG tablet TAKE ONE TABLET BY MOUTH ONCE WEEKLY ON MONDAY and TAKE TWO TABLETS BY MOUTH ONCE WEEKLY ON TUESDAY and TAKE ONE TABLET BY MOUTH ONCE WEEKLY ON WEDNESDAY and TAKE TWO TABLETS BY MOUTH ONCE WEEKLY ON THURSDAY and TAKE ONE TABLET  BY MOUTH ONCE WEEKLY ON FRIDAY and TAKE TWO TABLETS BY MOUTH ONCE WEEKLY ON SATURDAY and TAKE ONE TABLET BY MOUTH ONCE WEEKLY ON SUNDAY   methadone (DOLOPHINE) 5 MG tablet Take 1 tablet by mouth 2 (two) times daily.   metoCLOPramide (REGLAN) 10 MG tablet Take 1 tablet (10 mg total) by mouth every 6 (six) hours as needed for nausea.   ondansetron (ZOFRAN) 8 MG tablet Take 1 tablet (8 mg total) by mouth every 8 (eight) hours as needed for nausea or  vomiting.   promethazine (PHENERGAN) 25 MG tablet TAKE ONE TABLET BY MOUTH FOR 8 HOURS AS NEEDED   sertraline (ZOLOFT) 100 MG tablet TAKE TWO TABLETS BY MOUTH EVERY MORNING   sucralfate (CARAFATE) 1 g tablet Take 1 tablet (1 g total) by mouth 3 (three) times daily as needed for up to 14 days. (Patient taking differently: Take 1 g by mouth as needed.)   tiZANidine (ZANAFLEX) 4 MG capsule Take 3 capsules (12 mg total) by mouth at bedtime as needed for muscle spasms.   tiZANidine (ZANAFLEX) 4 MG tablet Take 12 mg by mouth at bedtime.   topiramate (TOPAMAX) 25 MG tablet Take by mouth.   triamcinolone cream (KENALOG) 0.1 % daily as needed.   Facility-Administered Encounter Medications as of 12/01/2021  Medication   cyanocobalamin ((VITAMIN B-12)) injection 1,000 mcg  Reviewed chart for medication changes ahead of medication coordination call.  No OVs, Consults, or hospital visits since last care coordination call/Pharmacist visit.  No medication changes indicated  BP Readings from Last 3 Encounters:  11/11/21 120/78  10/29/21 124/80  06/26/21 112/72    Lab Results  Component Value Date   HGBA1C 5.6 11/15/2019     Patient obtains medications through Adherence Packaging  90 Days   Last adherence delivery included:  Estradiol (Estrace) 2 mg : Take one tab at Breakfast Lamotrigine (Lamictal) 100 mg: Take one tab at Breakfast and one at Bedtime Levothyroxine (Synthroid) 125 mg : Take one tab at Breakfast Buspirone (Buspar) 15 mg : Take half tab at Breakfast and half at bedtime Sertraline (Zoloft) 100 mg : Take two tabs with Breakfast Toprimate (Topomax) 25 EX:NTZG one tab at Breakfast and 4 at Bedtime Tizanidine (Zanaflex) 4 mg : Take 3 tabs at bedtime Folic Acid 1 mg: Take one tab at breakfast and one at bedtime  Medroxyprogesterone (Provera) 2.5 mg : Take one tab daily at breakfast Lansoprazole ( Prevacid) 30 mg: Take one capsule breakfast and dinner  Patient needs refills for   Methadone 5 mg - Patient reports that she will be due for this on the 22nd had her dosing increased but can wait for her delivery on the 22nd.   Hydromorphone 4 mg - Patient ok to wait for delivery of packs on the 22nd   Patient is due for next adherence delivery on: 12/11/21. Called patient and reviewed medications and coordinated delivery. Confirmed Packs for 90 DS  This delivery to include: Fluticasone Nasal Spray Lamotrigine (Lamictal) 100 mg: Take one tab at Breakfast and one at Bedtime Levothyroxine (Synthroid) 125 mg : Take one tab at Breakfast Sertraline (Zoloft) 100 mg : Take two tabs with Breakfast Buspirone (Buspar) 15 mg : Take half tab at Breakfast and half at bedtime Toprimate (Topomax) 27 YF:VCBS one tab at Breakfast and 4 at Bedtime Tizanidine (Zanaflex) 4 mg : Take 3 tabs at bedtime Folic Acid 1 mg: Take one tab at breakfast and one at bedtime  Lansoprazole ( Prevacid) 30 mg: Take  one capsule breakfast and dinner  Estradiol (Estrace) 2 mg : Take one tab at Breakfast Medroxyprogesterone (Provera) 2.5 mg : Take one tab Mon ,2 on Tue, 1 on Wed, 2 on Thurs ,1 on Fri, 2 on Sat and 1 on [name] Levocetirizine 5 mg: Take one tab at  Vitamin B-12 injection : Inject 1 ml once a month Hypo Needle 25 g Azelastine Spray Promethazine (Phenergan) 25 mg: Take 1 tab every 8 hours PRN    Patient will need a short fill of (med), prior to adherence delivery. (To align with sync date or if PRN med)  Coordinated acute fill for (med) to be delivered (date).  Patient declined the following medications (meds) due to (reason)  Patient needs refills for ***.  Confirmed delivery date of ***, advised patient that pharmacy will contact them the morning of delivery.   Care Gaps: Eye Exam - Overdue HIV Screening - Overdue TDAP - Overdue COVID 19 Vaccine - Overdue AWV- 1/22 CCM -7/23  Star Rating Drugs: None    Ned Clines [place] Clinical Pharmacist Assistant [phone]

## 2021-12-02 ENCOUNTER — Telehealth: Payer: Self-pay

## 2021-12-02 LAB — BASIC METABOLIC PANEL
BUN: 17 (ref 4–21)
CO2: 27 — AB (ref 13–22)
Chloride: 104 (ref 99–108)
Creatinine: 1.2 — AB (ref 0.5–1.1)
Glucose: 80
Potassium: 4.1 meq/L (ref 3.5–5.1)
Sodium: 140 (ref 137–147)

## 2021-12-02 LAB — HEPATIC FUNCTION PANEL
ALT: 10 U/L (ref 7–35)
AST: 16 (ref 13–35)
Alkaline Phosphatase: 68 (ref 25–125)

## 2021-12-02 LAB — CBC AND DIFFERENTIAL
HCT: 40 (ref 36–46)
Hemoglobin: 13.1 (ref 12.0–16.0)
Platelets: 241 10*3/uL (ref 150–400)
WBC: 5.5

## 2021-12-02 LAB — COMPREHENSIVE METABOLIC PANEL
Albumin: 4 (ref 3.5–5.0)
Calcium: 9.1 (ref 8.7–10.7)

## 2021-12-02 LAB — CBC: RBC: 4.44 (ref 3.87–5.11)

## 2021-12-02 NOTE — Telephone Encounter (Signed)
Pt's pharmacy called wanting to verify the patients dose of Provera has changed.   ? ?Margarita Bobrowski,RN  ?12/02/21 ?

## 2021-12-03 NOTE — Telephone Encounter (Signed)
Returned call to pharmacy. Pharmacy tech states patient's care coordinator between pharmacy and PCP had called pharmacy to request they do not fill Provera prescription until recommendation is received from OB/GYN. Message sent to Damita Dunnings, MD for clarification about prescription.  ?

## 2021-12-03 NOTE — Telephone Encounter (Signed)
Per Damita Dunnings, this is being managed entirely by PCP. Damita Dunnings did discuss tapering down Estradiol due to age and encouraged patient to continue Provera during this taper. Reviewed this with pharmacy. Explained we are not managing these medications and cannot make recommendations to pharmacy. Called pt to explain issue and to offer appt with Damita Dunnings to discuss HRT if she would like [place] to begin managing. Pt declines. ?

## 2021-12-06 ENCOUNTER — Other Ambulatory Visit: Payer: Self-pay | Admitting: Family Medicine

## 2021-12-06 DIAGNOSIS — N951 Menopausal and female climacteric states: Secondary | ICD-10-CM

## 2021-12-06 MED ORDER — MEDROXYPROGESTERONE ACETATE 2.5 MG PO TABS: ORAL_TABLET | ORAL | 1 refills | Status: DC

## 2021-12-08 ENCOUNTER — Other Ambulatory Visit: Payer: Self-pay | Admitting: Family Medicine

## 2021-12-08 DIAGNOSIS — J302 Other seasonal allergic rhinitis: Secondary | ICD-10-CM

## 2021-12-10 DIAGNOSIS — L503 Dermatographic urticaria: Secondary | ICD-10-CM | POA: Diagnosis not present

## 2021-12-10 DIAGNOSIS — L501 Idiopathic urticaria: Secondary | ICD-10-CM | POA: Diagnosis not present

## 2021-12-11 ENCOUNTER — Encounter: Payer: Self-pay | Admitting: Family Medicine

## 2021-12-11 ENCOUNTER — Encounter: Payer: Self-pay | Admitting: Cardiology

## 2021-12-19 DIAGNOSIS — M359 Systemic involvement of connective tissue, unspecified: Secondary | ICD-10-CM | POA: Diagnosis not present

## 2021-12-19 DIAGNOSIS — R768 Other specified abnormal immunological findings in serum: Secondary | ICD-10-CM | POA: Diagnosis not present

## 2021-12-19 DIAGNOSIS — M79641 Pain in right hand: Secondary | ICD-10-CM | POA: Diagnosis not present

## 2021-12-19 DIAGNOSIS — R5383 Other fatigue: Secondary | ICD-10-CM | POA: Diagnosis not present

## 2021-12-19 DIAGNOSIS — M329 Systemic lupus erythematosus, unspecified: Secondary | ICD-10-CM | POA: Diagnosis not present

## 2021-12-19 DIAGNOSIS — W19XXXA Unspecified fall, initial encounter: Secondary | ICD-10-CM | POA: Diagnosis not present

## 2021-12-19 DIAGNOSIS — G9050 Complex regional pain syndrome I, unspecified: Secondary | ICD-10-CM | POA: Diagnosis not present

## 2021-12-19 DIAGNOSIS — M1990 Unspecified osteoarthritis, unspecified site: Secondary | ICD-10-CM | POA: Diagnosis not present

## 2021-12-19 DIAGNOSIS — I7300 Raynaud's syndrome without gangrene: Secondary | ICD-10-CM | POA: Diagnosis not present

## 2021-12-19 DIAGNOSIS — G8929 Other chronic pain: Secondary | ICD-10-CM | POA: Diagnosis not present

## 2021-12-19 DIAGNOSIS — M797 Fibromyalgia: Secondary | ICD-10-CM | POA: Diagnosis not present

## 2021-12-22 ENCOUNTER — Ambulatory Visit (INDEPENDENT_AMBULATORY_CARE_PROVIDER_SITE_OTHER): Payer: PPO

## 2021-12-22 DIAGNOSIS — R55 Syncope and collapse: Secondary | ICD-10-CM | POA: Diagnosis not present

## 2021-12-22 DIAGNOSIS — M542 Cervicalgia: Secondary | ICD-10-CM | POA: Diagnosis not present

## 2021-12-22 DIAGNOSIS — G894 Chronic pain syndrome: Secondary | ICD-10-CM | POA: Diagnosis not present

## 2021-12-22 DIAGNOSIS — Z79891 Long term (current) use of opiate analgesic: Secondary | ICD-10-CM | POA: Diagnosis not present

## 2021-12-22 DIAGNOSIS — G90512 Complex regional pain syndrome I of left upper limb: Secondary | ICD-10-CM | POA: Diagnosis not present

## 2021-12-22 LAB — CUP PACEART REMOTE DEVICE CHECK
Date Time Interrogation Session: 20230331230552
Implantable Pulse Generator Implant Date: 20221006

## 2022-01-02 NOTE — Progress Notes (Signed)
Carelink Summary Report / Loop Recorder 

## 2022-01-20 DIAGNOSIS — Z79891 Long term (current) use of opiate analgesic: Secondary | ICD-10-CM | POA: Diagnosis not present

## 2022-01-20 DIAGNOSIS — M542 Cervicalgia: Secondary | ICD-10-CM | POA: Diagnosis not present

## 2022-01-20 DIAGNOSIS — G90512 Complex regional pain syndrome I of left upper limb: Secondary | ICD-10-CM | POA: Diagnosis not present

## 2022-01-20 DIAGNOSIS — G894 Chronic pain syndrome: Secondary | ICD-10-CM | POA: Diagnosis not present

## 2022-01-22 LAB — CUP PACEART REMOTE DEVICE CHECK
Date Time Interrogation Session: 20230503230753
Implantable Pulse Generator Implant Date: 20221006

## 2022-01-26 ENCOUNTER — Ambulatory Visit (INDEPENDENT_AMBULATORY_CARE_PROVIDER_SITE_OTHER): Payer: PPO

## 2022-01-26 DIAGNOSIS — R55 Syncope and collapse: Secondary | ICD-10-CM | POA: Diagnosis not present

## 2022-02-04 ENCOUNTER — Ambulatory Visit (INDEPENDENT_AMBULATORY_CARE_PROVIDER_SITE_OTHER): Payer: PPO | Admitting: Family Medicine

## 2022-02-04 ENCOUNTER — Encounter: Payer: Self-pay | Admitting: Family Medicine

## 2022-02-04 ENCOUNTER — Ambulatory Visit (INDEPENDENT_AMBULATORY_CARE_PROVIDER_SITE_OTHER): Payer: PPO

## 2022-02-04 VITALS — BP 112/72 | HR 75 | Temp 98.4°F | Ht 64.0 in | Wt 173.5 lb

## 2022-02-04 DIAGNOSIS — F341 Dysthymic disorder: Secondary | ICD-10-CM

## 2022-02-04 DIAGNOSIS — E039 Hypothyroidism, unspecified: Secondary | ICD-10-CM

## 2022-02-04 DIAGNOSIS — G9059 Complex regional pain syndrome I of other specified site: Secondary | ICD-10-CM | POA: Diagnosis not present

## 2022-02-04 DIAGNOSIS — M069 Rheumatoid arthritis, unspecified: Secondary | ICD-10-CM

## 2022-02-04 DIAGNOSIS — N3000 Acute cystitis without hematuria: Secondary | ICD-10-CM | POA: Diagnosis not present

## 2022-02-04 DIAGNOSIS — M797 Fibromyalgia: Secondary | ICD-10-CM | POA: Diagnosis not present

## 2022-02-04 DIAGNOSIS — M25561 Pain in right knee: Secondary | ICD-10-CM

## 2022-02-04 DIAGNOSIS — M25562 Pain in left knee: Secondary | ICD-10-CM

## 2022-02-04 LAB — URINALYSIS, ROUTINE W REFLEX MICROSCOPIC
Bilirubin Urine: NEGATIVE
Hgb urine dipstick: NEGATIVE
Ketones, ur: NEGATIVE
Nitrite: NEGATIVE
Specific Gravity, Urine: 1.025 (ref 1.000–1.030)
Total Protein, Urine: NEGATIVE
Urine Glucose: NEGATIVE
Urobilinogen, UA: 0.2 (ref 0.0–1.0)
pH: 5.5 (ref 5.0–8.0)

## 2022-02-04 LAB — BASIC METABOLIC PANEL
BUN: 21 mg/dL (ref 6–23)
CO2: 24 mEq/L (ref 19–32)
Calcium: 9.7 mg/dL (ref 8.4–10.5)
Chloride: 106 mEq/L (ref 96–112)
Creatinine, Ser: 1.09 mg/dL (ref 0.40–1.20)
GFR: 56.02 mL/min — ABNORMAL LOW (ref >60.00)
Glucose, Bld: 95 mg/dL (ref 70–99)
Potassium: 4.6 mEq/L (ref 3.5–5.1)
Sodium: 138 mEq/L (ref 135–145)

## 2022-02-04 LAB — CBC WITH DIFFERENTIAL/PLATELET
Basophils Absolute: 0 10*3/uL (ref 0.0–0.1)
Basophils Relative: 0.5 % (ref 0.0–3.0)
Eosinophils Absolute: 0.1 10*3/uL (ref 0.0–0.7)
Eosinophils Relative: 1.9 % (ref 0.0–5.0)
HCT: 39.9 % (ref 36.0–46.0)
Hemoglobin: 13.3 g/dL (ref 12.0–15.0)
Lymphocytes Relative: 22.9 % (ref 12.0–46.0)
Lymphs Abs: 1.3 10*3/uL (ref 0.7–4.0)
MCHC: 33.3 g/dL (ref 30.0–36.0)
MCV: 90 fl (ref 78.0–100.0)
Monocytes Absolute: 0.4 10*3/uL (ref 0.1–1.0)
Monocytes Relative: 7.1 % (ref 3.0–12.0)
Neutro Abs: 3.7 10*3/uL (ref 1.4–7.7)
Neutrophils Relative %: 67.6 % (ref 43.0–77.0)
Platelets: 274 10*3/uL (ref 150.0–400.0)
RBC: 4.43 Mil/uL (ref 3.87–5.11)
RDW: 13.3 % (ref 11.5–15.5)
WBC: 5.5 10*3/uL (ref 4.0–10.5)

## 2022-02-04 LAB — SEDIMENTATION RATE: Sed Rate: 26 mm/hr (ref 0–30)

## 2022-02-04 LAB — URIC ACID: Uric Acid, Serum: 4.7 mg/dL (ref 2.4–7.0)

## 2022-02-04 NOTE — Progress Notes (Signed)
Jo [name] DOB: 05-15-1961 Encounter date: 02/04/2022  This is a 61 y.o. female who presents with Chief Complaint  Patient presents with   Follow-up    History of present illness: Last visit with me was 09/03/21. She did meet with pharmacy 10/07/21. Had concern about bleeding at that time and was suggested to follow up with pcp.   Postmenopausal bleeding: had D and C, hysteroscopy/polypectomy completed 11/11/21. Has done well since this. No spotting.   Gained weight after stopping medication (benlysta) and feels more hungry. Getting up and eating even at night. Was off this for 2 months, but restarted 1.5 months ago.  Left knee is feeling worse - not sure if RA or CRPS. Legs have been cold.   Had episode felt like low blood sugar, almost felt "drunk".   Feels like she might have UTI. Was having llq pain down into bladder. Drinking cranberry and that has helped. No burning, but never has any. Still with lower back pain, not sure if related. Still with hot sweats, other times feeling cold. Has had some incontinence in sleep. Was happening more before starting cranberry supplement with D manos.   Sucralfate helped with stomach, but caused constipation; which then worsens nausea. Suggested today she try half tablet and see if this helps with stomach without the constipation side effect. Really helped with the nausea.   Just went through depression, and feels she is coming out of this now. She is worried about her legs and leg pain. Has been better with leg pain last few days, but before that couldn't walk at all. Battling pain all the time between CRPS, RA, fibromyalgia. Hard to sleep.    Allergies  Allergen Reactions   Bee Pollen Anaphylaxis   Diazepam     sucidal depression   Prednisone Nausea And Vomiting    Per pt can not take oral steroids    Amoxicillin     Severe diarrhea At high dose    Doxycycline     Pt does not remember reaction   Emetrol     Other reaction(s): GI  upset   Erythromycin     n & v   Gabapentin     Doesn't remember; didn't tolerate   Imuran [Azathioprine]     Patient states this was discontinued due to liver problems   Methotrexate Derivatives     diarrhea   Morphine     n & v   Propoxyphene N-Acetaminophen     n & V   Sulfa Antibiotics     MIGRAINES   Tetracyclines & Related     N & v   Propoxyphene Other (See Comments)   Current Meds  Medication Sig   azelastine (ASTELIN) 0.1 % nasal spray instill one SPRAY into BOTH nostrils twice daily   B-D 3CC LUER-LOK SYR 23GX1" 23G X 1" 3 ML MISC See admin instructions.   Belimumab (BENLYSTA) 200 MG/ML SOSY once a week.   betamethasone dipropionate 0.05 % lotion Apply topically as needed.   busPIRone (BUSPAR) 15 MG tablet TAKE 1/2 TABLET BY MOUTH EVERY MORNING and TAKE 1/2 TABLET BY MOUTH EVERYDAY AT BEDTIME   cholecalciferol (VITAMIN D) 1000 UNITS tablet Take 2,000 Units by mouth daily.   clindamycin (CLEOCIN T) 1 % lotion Apply 1 application topically 2 (two) times daily as needed.   Cranberry 200 MG CAPS Take 200 mg by mouth 2 (two) times daily.   erythromycin with ethanol (EMGEL) 2 % gel Apply topically 2 (two) times  daily. (Patient taking differently: Apply topically 2 (two) times daily as needed.)   estradiol (ESTRACE) 2 MG tablet TAKE ONE TABLET BY MOUTH ONCE DAILY   fluticasone (CUTIVATE) 0.05 % cream Apply 1 application topically 2 (two) times daily as needed (rash).    fluticasone (FLONASE) 50 MCG/ACT nasal spray USE TWO SPRAYS IN EACH NOSTRIL DAILY AS DIRECTED   folic acid (FOLVITE) 1 MG tablet Take 1 tablet (1 mg total) by mouth daily. (Patient taking differently: Take 1 mg by mouth in the morning and at bedtime.)   HYDROmorphone (DILAUDID) 4 MG tablet Take by mouth as needed for severe pain.   lamoTRIgine (LAMICTAL) 100 MG tablet TAKE ONE TABLET BY MOUTH AT BREAKFAST AND AT BEDTIME   lansoprazole (PREVACID) 30 MG capsule Take 1 capsule (30 mg total) by mouth 2 (two) times  daily before a meal.   levocetirizine (XYZAL) 5 MG tablet Take 5 mg by mouth daily.   levofloxacin (LEVAQUIN) 500 MG tablet Take 1 tablet (500 mg total) by mouth daily.   levothyroxine (SYNTHROID) 125 MCG tablet TAKE ONE TABLET BY MOUTH EVERY MORNING   LYSINE PO Take 1 tablet by mouth daily.   medroxyPROGESTERone (PROVERA) 2.5 MG tablet Take 1 tablet PO daily   methadone (DOLOPHINE) 5 MG tablet Take 1 tablet by mouth 2 (two) times daily.   metoCLOPramide (REGLAN) 10 MG tablet Take 1 tablet (10 mg total) by mouth every 6 (six) hours as needed for nausea.   ondansetron (ZOFRAN) 8 MG tablet Take 1 tablet (8 mg total) by mouth every 8 (eight) hours as needed for nausea or vomiting.   promethazine (PHENERGAN) 25 MG tablet TAKE ONE TABLET BY MOUTH FOR 8 HOURS AS NEEDED   sertraline (ZOLOFT) 100 MG tablet TAKE TWO TABLETS BY MOUTH EVERY MORNING   tiZANidine (ZANAFLEX) 4 MG capsule Take 3 capsules (12 mg total) by mouth at bedtime as needed for muscle spasms.   tiZANidine (ZANAFLEX) 4 MG tablet Take 12 mg by mouth at bedtime.   topiramate (TOPAMAX) 25 MG tablet Take by mouth.   triamcinolone cream (KENALOG) 0.1 % daily as needed.   [DISCONTINUED] doxycycline (VIBRAMYCIN) 100 MG capsule Take 100 mg by mouth 2 (two) times daily.   Current Facility-Administered Medications for the 02/04/22 encounter (Office Visit) with Caren Macadam, MD  Medication   cyanocobalamin ((VITAMIN B-12)) injection 1,000 mcg    Review of Systems  Constitutional:  Positive for fatigue. Negative for chills and fever.  Respiratory:  Negative for cough, chest tightness, shortness of breath and wheezing.   Cardiovascular:  Negative for chest pain, palpitations and leg swelling.  Musculoskeletal:  Positive for arthralgias.  Neurological:  Positive for numbness (part of pain/painful numbness legs).  Psychiatric/Behavioral:  Positive for sleep disturbance.    Objective:  BP 112/72 (BP Location: Right Arm, Patient  Position: Sitting, Cuff Size: Normal)   Pulse 75   Temp 98.4 F (36.9 C) (Oral)   Ht '5\' 4"'$  (1.626 m)   Wt 173 lb 8 oz (78.7 kg)   SpO2 97%   BMI 29.78 kg/m   Weight: 173 lb 8 oz (78.7 kg)   BP Readings from Last 3 Encounters:  02/04/22 112/72  11/11/21 120/78  10/29/21 124/80   Wt Readings from Last 3 Encounters:  02/04/22 173 lb 8 oz (78.7 kg)  11/11/21 143 lb 6.4 oz (65 kg)  10/29/21 161 lb 3.2 oz (73.1 kg)    Physical Exam Constitutional:      General: She is  not in acute distress.    Appearance: She is well-developed.  Cardiovascular:     Rate and Rhythm: Normal rate and regular rhythm.     Pulses:          Dorsalis pedis pulses are 2+ on the right side and 2+ on the left side.       Posterior tibial pulses are 2+ on the right side and 2+ on the left side.     Heart sounds: Normal heart sounds. No murmur heard.   No friction rub.  Pulmonary:     Effort: Pulmonary effort is normal. No respiratory distress.     Breath sounds: Normal breath sounds. No wheezing or rales.  Musculoskeletal:     Right lower leg: No edema.     Left lower leg: No edema.     Comments: Pain with mobility of knees bilat, but no restriction with ROM. Skin is cool, but well perfused over knees bilat lower legs.   Skin:         Comments: Left fingers are cool, deeper erythema than left (chronic associated with CRPS)  Neurological:     Mental Status: She is alert and oriented to person, place, and time.  Psychiatric:        Behavior: Behavior normal.    Assessment/Plan 1. Acute cystitis without hematuria - Urinalysis; Future - Urine Culture; Future - Urine Culture - Urinalysis  2. Hypothyroidism, unspecified type Continue synthroid 166mg daily.  3. DEPRESSION/ANXIETY Gene sight testing ordered; she has had chronic depression, anxiety and although stable, not fully controlled on current medications. For future med consideration/adjustment we will get gene sight testing.   4.  Rheumatoid arthritis, involving unspecified site, unspecified whether rheumatoid factor present ([facility] Following with rheumatology regularly. Does better with massage therapy and has better pain control with this.  - Ambulatory referral to Physical Therapy  5. Complex regional pain syndrome type 1 affecting other site See above.  - Ambulatory referral to Physical Therapy - Basic metabolic panel; Future - Basic metabolic panel  6. Fibromyalgia See above.  - Ambulatory referral to Physical Therapy  7. Pain in both knees, unspecified chronicity She has not had knee imaging; we will get this today to help direct care (whether RA or osteoarthritis or more fibromyalgia related) - DG Knee Complete 4 Views Left; Future - Sedimentation rate; Future - Uric acid; Future - CBC with Differential/Platelet; Future - CBC with Differential/Platelet - Uric acid - Sedimentation rate  Return in about 3 months (around 05/07/2022) for establish care. 45 minutes spent in chart review, time with patient, exam, charting.      JMicheline Rough MD

## 2022-02-04 NOTE — Patient Instructions (Signed)
We will go ahead and send you a gene sight testing kit to get an idea of alternative medications that may be better for you. Once you have completed the testing then we should have results within a week. Let Maddie know if you have not heard back about this.  ? ?Try doing half ot he sucralfate.  ?

## 2022-02-05 ENCOUNTER — Telehealth: Payer: Self-pay | Admitting: Family Medicine

## 2022-02-05 ENCOUNTER — Encounter: Payer: Self-pay | Admitting: Family Medicine

## 2022-02-05 DIAGNOSIS — F341 Dysthymic disorder: Secondary | ICD-10-CM

## 2022-02-05 DIAGNOSIS — J302 Other seasonal allergic rhinitis: Secondary | ICD-10-CM

## 2022-02-05 DIAGNOSIS — E039 Hypothyroidism, unspecified: Secondary | ICD-10-CM

## 2022-02-05 NOTE — Telephone Encounter (Signed)
Error/njr °

## 2022-02-09 MED ORDER — AZELASTINE HCL 0.1 % NA SOLN: NASAL | 5 refills | Status: DC

## 2022-02-09 MED ORDER — TIZANIDINE HCL 4 MG PO CAPS: 12.0000 mg | ORAL_CAPSULE | Freq: Every evening | ORAL | 1 refills | Status: DC | PRN

## 2022-02-09 MED ORDER — LEVOTHYROXINE SODIUM 125 MCG PO TABS: 125.0000 ug | ORAL_TABLET | Freq: Every morning | ORAL | 1 refills | Status: DC

## 2022-02-09 MED ORDER — FLUTICASONE PROPIONATE 50 MCG/ACT NA SUSP: NASAL | 5 refills | Status: DC

## 2022-02-09 MED ORDER — BUSPIRONE HCL 15 MG PO TABS: ORAL_TABLET | ORAL | 1 refills | Status: DC

## 2022-02-09 MED ORDER — LANSOPRAZOLE 30 MG PO CPDR: 30.0000 mg | DELAYED_RELEASE_CAPSULE | Freq: Two times a day (BID) | ORAL | 3 refills | Status: DC

## 2022-02-09 MED ORDER — SERTRALINE HCL 100 MG PO TABS: 200.0000 mg | ORAL_TABLET | Freq: Every morning | ORAL | 1 refills | Status: DC

## 2022-02-09 MED ORDER — LAMOTRIGINE 100 MG PO TABS: ORAL_TABLET | ORAL | 1 refills | Status: DC

## 2022-02-11 ENCOUNTER — Encounter: Payer: Self-pay | Admitting: Physical Therapy

## 2022-02-11 ENCOUNTER — Ambulatory Visit: Payer: PPO | Attending: Family Medicine | Admitting: Physical Therapy

## 2022-02-11 DIAGNOSIS — M25562 Pain in left knee: Secondary | ICD-10-CM | POA: Insufficient documentation

## 2022-02-11 DIAGNOSIS — M069 Rheumatoid arthritis, unspecified: Secondary | ICD-10-CM | POA: Insufficient documentation

## 2022-02-11 DIAGNOSIS — M797 Fibromyalgia: Secondary | ICD-10-CM | POA: Diagnosis not present

## 2022-02-11 DIAGNOSIS — M6281 Muscle weakness (generalized): Secondary | ICD-10-CM | POA: Insufficient documentation

## 2022-02-11 DIAGNOSIS — R269 Unspecified abnormalities of gait and mobility: Secondary | ICD-10-CM | POA: Diagnosis not present

## 2022-02-11 DIAGNOSIS — G9059 Complex regional pain syndrome I of other specified site: Secondary | ICD-10-CM | POA: Diagnosis not present

## 2022-02-11 DIAGNOSIS — M25512 Pain in left shoulder: Secondary | ICD-10-CM | POA: Diagnosis not present

## 2022-02-11 DIAGNOSIS — G8929 Other chronic pain: Secondary | ICD-10-CM

## 2022-02-11 DIAGNOSIS — R2689 Other abnormalities of gait and mobility: Secondary | ICD-10-CM
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
## 2022-02-11 NOTE — Progress Notes (Signed)
Carelink Summary Report / Loop Recorder 

## 2022-02-11 NOTE — Patient Instructions (Signed)
     [facility] Welcome to [facility]! Here you will find all the information you will need regarding your pool therapy. If you have further questions at any time, please call our office at [phone]. After completing your initial evaluation in the [facility], you may be eligible to complete a portion of your therapy in the pool. A typical week of therapy will consist of 1-2 typical physical therapy visits at our [facility] location and an additional session of therapy in the pool located at the [facility] at [address]. [address], [place] [address]. The phone number at the pool site is [phone]. Please call this number if you are running late or need to cancel your appointment.  Aquatic therapy will be offered on Wednesday mornings and Friday afternoons. Each session will last approximately 45 minutes. All scheduling and payments for aquatic therapy sessions, including cancelations, will be done through our [facility] location.  To be eligible for aquatic therapy, these criteria must be met: You must be able to independently change in the locker room and get to the pool deck. A caregiver can come with you to help if needed. There are benches for a caregiver to sit on next to the pool. No one with an open wound is permitted in the pool.  Handicap parking is available in the front and there is a drop off option for even closer accessibility. Please arrive 15 minutes prior to your appointment to prepare for your pool session. You must sign in at the front desk upon your arrival. Please be sure to attend to any toileting needs prior to entering the pool. Locker rooms for changing are available.  There is direct access to the pool deck from the locker room. You can lock your belongings in a locker or bring them with you poolside. Your therapist will greet you on the pool deck. There may be other swimmers in the pool at the  same time but your session is one-on-one with the therapist.   

## 2022-02-19 ENCOUNTER — Ambulatory Visit: Payer: PPO | Attending: Family Medicine | Admitting: Physical Therapy

## 2022-02-19 ENCOUNTER — Encounter: Payer: Self-pay | Admitting: Physical Therapy

## 2022-02-19 DIAGNOSIS — M6281 Muscle weakness (generalized): Secondary | ICD-10-CM | POA: Diagnosis not present

## 2022-02-19 DIAGNOSIS — M25512 Pain in left shoulder: Secondary | ICD-10-CM | POA: Diagnosis not present

## 2022-02-19 DIAGNOSIS — R2689 Other abnormalities of gait and mobility: Secondary | ICD-10-CM | POA: Insufficient documentation

## 2022-02-19 DIAGNOSIS — G8929 Other chronic pain: Secondary | ICD-10-CM | POA: Diagnosis not present

## 2022-02-19 DIAGNOSIS — M25562 Pain in left knee: Secondary | ICD-10-CM | POA: Insufficient documentation
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
## 2022-02-20 ENCOUNTER — Ambulatory Visit (HOSPITAL_BASED_OUTPATIENT_CLINIC_OR_DEPARTMENT_OTHER): Payer: PPO | Attending: Family Medicine | Admitting: Physical Therapy

## 2022-02-20 ENCOUNTER — Encounter (HOSPITAL_BASED_OUTPATIENT_CLINIC_OR_DEPARTMENT_OTHER): Payer: Self-pay | Admitting: Physical Therapy

## 2022-02-20 DIAGNOSIS — M25512 Pain in left shoulder: Secondary | ICD-10-CM | POA: Insufficient documentation

## 2022-02-20 DIAGNOSIS — M6281 Muscle weakness (generalized): Secondary | ICD-10-CM | POA: Insufficient documentation

## 2022-02-20 DIAGNOSIS — R2689 Other abnormalities of gait and mobility: Secondary | ICD-10-CM | POA: Insufficient documentation

## 2022-02-20 DIAGNOSIS — G8929 Other chronic pain: Secondary | ICD-10-CM | POA: Insufficient documentation

## 2022-02-20 DIAGNOSIS — M25562 Pain in left knee: Secondary | ICD-10-CM | POA: Insufficient documentation
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
## 2022-02-23 ENCOUNTER — Encounter: Payer: Self-pay | Admitting: Physical Therapy

## 2022-02-23 ENCOUNTER — Ambulatory Visit: Payer: PPO | Admitting: Physical Therapy

## 2022-02-23 DIAGNOSIS — M6281 Muscle weakness (generalized): Secondary | ICD-10-CM | POA: Diagnosis not present

## 2022-02-23 DIAGNOSIS — G8929 Other chronic pain: Secondary | ICD-10-CM

## 2022-02-23 DIAGNOSIS — R2689 Other abnormalities of gait and mobility: Secondary | ICD-10-CM
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
## 2022-02-27 ENCOUNTER — Telehealth: Payer: Self-pay | Admitting: Adult Health

## 2022-02-27 ENCOUNTER — Telehealth: Payer: Self-pay | Admitting: Pharmacist

## 2022-02-27 NOTE — Chronic Care Management (AMB) (Unsigned)
Chronic Care Management Pharmacy Assistant   Name: Jo [name]  MRN: [id number] DOB: 1961/05/01  Reason for Encounter: Medication Review Medication Coordination   Recent office visits:  02/04/22 Caren Macadam, MD - Patient presented or Acute cystitis without hematuria and other concerns. Stopped doxycycline.  Recent consult visits:  02/23/22 Beuhring, Alene Mires, PT - Patient presented to [place] for Muscle weakness generalized and other concerns. No medication changes.  02/20/22 Vedia Pereyra, PT- Patient presented to [place] for Muscle weakness generalized and other concerns. No medication changes.  02/19/22 Beuhring, Alene Mires, PT - Patient presented to [place] for Muscle weakness generalized and other concerns. No medication changes.  02/11/22 Beuhring, Alene Mires, PT - Patient presented to [place] for Muscle weakness generalized and other concerns. No medication changes.  12/19/21 [name] - Patient presented for Chronic pain and other concerns. No medication changes.  12/10/21 Lennie Odor (Derm) - Claims encounter for Dermatographic urticaria and other concerns. No other visit details available.  Hospital visits:  Medication Reconciliation was completed by comparing discharge summary, patient's EMR and Pharmacy list, and upon discussion with patient.   Patient presented to [facility] on 11/11/21 due to D&C/ Hysteroscopy. Patient was present for 3 hours   New?Medications Started at [facility] Discharge:?? -started  none   Medication Changes at Hospital Discharge: -Changed  none   Medications Discontinued at Hospital Discharge: -Stopped  none   Medications that remain the same after Hospital Discharge:??  -All other medications will remain the same.      Medications: Outpatient Encounter Medications as of 02/27/2022   Medication Sig   azelastine (ASTELIN) 0.1 % nasal spray instill one SPRAY into BOTH nostrils twice daily   B-D 3CC LUER-LOK SYR 23GX1" 23G X 1" 3 ML MISC See admin instructions.   Belimumab (BENLYSTA) 200 MG/ML SOSY once a week.   betamethasone dipropionate 0.05 % lotion Apply topically as needed.   busPIRone (BUSPAR) 15 MG tablet TAKE 1/2 TABLET BY MOUTH EVERY MORNING and TAKE 1/2 TABLET BY MOUTH EVERYDAY AT BEDTIME   cholecalciferol (VITAMIN D) 1000 UNITS tablet Take 2,000 Units by mouth daily.   clindamycin (CLEOCIN T) 1 % lotion Apply 1 application topically 2 (two) times daily as needed.   Cranberry 200 MG CAPS Take 200 mg by mouth 2 (two) times daily.   erythromycin with ethanol (EMGEL) 2 % gel Apply topically 2 (two) times daily. (Patient taking differently: Apply topically 2 (two) times daily as needed.)   estradiol (ESTRACE) 2 MG tablet TAKE ONE TABLET BY MOUTH ONCE DAILY   fluticasone (CUTIVATE) 0.05 % cream Apply 1 application topically 2 (two) times daily as needed (rash).    fluticasone (FLONASE) 50 MCG/ACT nasal spray USE TWO SPRAYS IN EACH NOSTRIL DAILY AS DIRECTED   folic acid (FOLVITE) 1 MG tablet Take 1 tablet (1 mg total) by mouth daily. (Patient taking differently: Take 1 mg by mouth in the morning and at bedtime.)   HYDROmorphone (DILAUDID) 4 MG tablet Take by mouth as needed for severe pain.   ketorolac (TORADOL) 30 MG/ML injection Inject 1 mL (30 mg total) into the muscle daily as needed for moderate pain. (Patient not taking: Reported on 02/04/2022)   lamoTRIgine (LAMICTAL) 100 MG tablet TAKE ONE TABLET BY MOUTH AT BREAKFAST AND AT BEDTIME   lansoprazole (PREVACID) 30 MG capsule Take 1 capsule (30  mg total) by mouth 2 (two) times daily before a meal.   levocetirizine (XYZAL) 5 MG tablet Take 5 mg by mouth daily.   levofloxacin (LEVAQUIN) 500 MG tablet Take 1 tablet (500 mg total) by mouth daily.   levothyroxine (SYNTHROID) 125 MCG tablet Take 1 tablet (125 mcg total) by  mouth every morning.   LYSINE PO Take 1 tablet by mouth daily.   medroxyPROGESTERone (PROVERA) 2.5 MG tablet Take 1 tablet PO daily   methadone (DOLOPHINE) 5 MG tablet Take 1 tablet by mouth 2 (two) times daily.   metoCLOPramide (REGLAN) 10 MG tablet Take 1 tablet (10 mg total) by mouth every 6 (six) hours as needed for nausea.   ondansetron (ZOFRAN) 8 MG tablet Take 1 tablet (8 mg total) by mouth every 8 (eight) hours as needed for nausea or vomiting.   promethazine (PHENERGAN) 25 MG tablet TAKE ONE TABLET BY MOUTH FOR 8 HOURS AS NEEDED   sertraline (ZOLOFT) 100 MG tablet Take 2 tablets (200 mg total) by mouth every morning.   sucralfate (CARAFATE) 1 g tablet Take 1 tablet (1 g total) by mouth 3 (three) times daily as needed for up to 14 days. (Patient taking differently: Take 1 g by mouth as needed.)   tiZANidine (ZANAFLEX) 4 MG capsule Take 3 capsules (12 mg total) by mouth at bedtime as needed for muscle spasms.   topiramate (TOPAMAX) 25 MG tablet Take by mouth.   triamcinolone cream (KENALOG) 0.1 % daily as needed.   Facility-Administered Encounter Medications as of 02/27/2022  Medication   cyanocobalamin ((VITAMIN B-12)) injection 1,000 mcg  Reviewed chart for medication changes ahead of medication coordination call.  No OVs, Consults, or hospital visits since last care coordination call/Pharmacist visit.   No medication changes indicated .  BP Readings from Last 3 Encounters:  02/04/22 112/72  11/11/21 120/78  10/29/21 124/80    Lab Results  Component Value Date   HGBA1C 5.6 11/15/2019     Patient obtains medications through Adherence Packaging  90 Days   Last adherence delivery included:  Fluticasone Nasal Spray Lamotrigine (Lamictal) 100 mg: Take one tab at Breakfast and one at Bedtime Levothyroxine (Synthroid) 125 mg : Take one tab at Breakfast Sertraline (Zoloft) 100 mg : Take two tabs with Breakfast Buspirone (Buspar) 15 mg : Take half tab at Breakfast and half at  bedtime Toprimate (Topomax) 25 RX:VQMG one tab at Breakfast and 4 at Bedtime Tizanidine (Zanaflex) 4 mg : Take 3 tabs at bedtime Folic Acid 1 mg: Take one tab at breakfast and one at bedtime  Lansoprazole ( Prevacid) 30 mg: Take one capsule breakfast and bedtime  Estradiol (Estrace) 2 mg : Take one tab at Breakfast Medroxyprogesterone (Provera) 2.5 mg : Take one tab Mon ,2 on Tue, 1 on Wed, 2 on Thurs ,1 on Fri, 2 on Sat and 1 on Sun (One a day per pt requested updated script) Levocetirizine 5 mg: Take one tab at breakfast Vitamin B-12 injection : Inject 1 ml once a month Hypo Needle 25 g Azelastine Spray (pt requested 90 DS) Promethazine (Phenergan) 25 mg: Take 1 tab every 8 hours PRN     Patient inquired if we have sucralfate in stock and if so shed like her prescription pulled from [facility] and filled. Noted on her call form for the pharmacy.     Patient is due for next adherence delivery on: 03/11/22. Called patient and reviewed medications and coordinated delivery. Packs 90 DS  This delivery to include:  Buspirone (Buspar) 15 mg : Take half tab at Breakfast and half at bedtime (packs or vial) Fluticasone Nasal Spray Lansoprazole ( Prevacid) 30 mg: Take one capsule breakfast and bedtime  Levothyroxine (Synthroid) 125 mg : Take one tab at Breakfast Sertraline (Zoloft) 100 mg : Take two tabs with Breakfast Lamotrigine (Lamictal) 100 mg: Take one tab at Breakfast and one at Bedtime Levocetirizine 5 mg: Take one tab at breakfast Hypo Needle 25 g Estradiol (Estrace) 2 mg : Take one tab at Breakfast Tizanidine (Zanaflex) 4 mg : Take 3 tabs at bedtime Medroxyprogesterone (Provera) 2.5 mg : Take one tab daily at breakfast  Vitamin B-12 injection : Inject 1 ml once a month Toprimate (Topomax) 25 UX:YBFX one tab at Breakfast and 4 at Bedtime Folic Acid 1 mg: Take one tab at breakfast and one at bedtime  Azelastine Spray (pt requested 90 DS)  Promethazine (Phenergan) 25 mg: Take 1 tab every  8 hours PRN  Patient will need a short fill of (med), prior to adherence delivery. (To align with sync date or if PRN med)  Coordinated acute fill for (med) to be delivered (date).  Patient declined the following medications (meds) due to (reason)  Patient needs refills for ***.  Confirmed delivery date of 03/11/22, advised patient that pharmacy will contact them the morning of delivery.   Care Gaps: Eye Exam - Overdue COVID Booster - Overdue TDAP - Postponed HIV Screen - Postponed CCM- 7/23 AWV- 1/22  Star Rating Drugs: None    Ned Clines [place] Clinical Pharmacist Assistant [phone]

## 2022-02-27 NOTE — Telephone Encounter (Signed)
Called patient about rescheduling upcoming appointments due to provider template. During the call, patient states she was told to only make appointments if needed. Patient requested to have August appointments cancelled. Appointments were cancelled per patients request.

## 2022-03-02 ENCOUNTER — Ambulatory Visit (INDEPENDENT_AMBULATORY_CARE_PROVIDER_SITE_OTHER): Payer: PPO

## 2022-03-02 DIAGNOSIS — R55 Syncope and collapse: Secondary | ICD-10-CM

## 2022-03-03 ENCOUNTER — Telehealth: Payer: Self-pay | Admitting: Family Medicine

## 2022-03-03 ENCOUNTER — Other Ambulatory Visit: Payer: Self-pay | Admitting: Family

## 2022-03-03 LAB — CUP PACEART REMOTE DEVICE CHECK
Date Time Interrogation Session: 20230605230609
Implantable Pulse Generator Implant Date: 20221006

## 2022-03-03 MED ORDER — "BD LUER-LOK SYRINGE 23G X 1"" 3 ML MISC": 2.0000 | 3 refills | Status: DC

## 2022-03-03 NOTE — Telephone Encounter (Signed)
Patient called stating that she was a previous patient of Dr Ethlyn Gallery. She recommended that the patient see Dr Tamala Julian for her sports med related issues.  She has been diagnoses with fibromyalgia, rheumatoid arthritis and crps.  She currently has a "fluid filled sack" on the back of her knee and also some bruising. She said that it is very painful to walk but doesn't know if it could be related to the CRPS or something else.  She asked if Dr Tamala Julian would see her for these issues?  Please advise.

## 2022-03-04 ENCOUNTER — Encounter: Payer: PPO | Admitting: Physical Therapy

## 2022-03-04 DIAGNOSIS — M25562 Pain in left knee: Secondary | ICD-10-CM | POA: Diagnosis not present

## 2022-03-04 DIAGNOSIS — M5432 Sciatica, left side: Secondary | ICD-10-CM | POA: Diagnosis not present

## 2022-03-05 ENCOUNTER — Telehealth: Payer: Self-pay | Admitting: *Deleted

## 2022-03-05 NOTE — Telephone Encounter (Signed)
[facility] faxed a refill request for Cyanocobalamin '1000mg'$  injection-once a month.  Message sent to [facility] as Rx not on the medication list as a prescription?

## 2022-03-05 NOTE — Progress Notes (Cosign Needed)
Patient returned call reports she would like to proceed with cash price of 44.25 for medication also would like pricing information for L-Lysine she is taking 1000 mg daily. Per Pharmacy 90 DS would be 6.29. Patient in agreement and would like it added to her packs. Pharmacy advised.    [place] Clinical Pharmacist Assistant [phone]

## 2022-03-06 NOTE — Telephone Encounter (Signed)
Left a message for the patient to return my call.  

## 2022-03-06 NOTE — Telephone Encounter (Signed)
Spoke with the patient and informed her of the message below.  Patient stated this is not correct as Dr Ethlyn Gallery gave her the Rx before the left.  I again informed her of the message below from the provider and advised her any prescriptions that are sent in would be noted in her chart.  Patient stated "this is not fair, we need to check our records as this is wrong" and would not allow me to schedule an appt.  Patient is aware message will be sent to clinical manager for a return call.

## 2022-03-08 ENCOUNTER — Other Ambulatory Visit: Payer: Self-pay | Admitting: Family

## 2022-03-08 MED ORDER — CYANOCOBALAMIN 1000 MCG/ML IJ SOLN: 1000.0000 ug | INTRAMUSCULAR | 1 refills | Status: DC

## 2022-03-11 ENCOUNTER — Ambulatory Visit (HOSPITAL_BASED_OUTPATIENT_CLINIC_OR_DEPARTMENT_OTHER): Payer: PPO | Admitting: Physical Therapy

## 2022-03-11 ENCOUNTER — Encounter (HOSPITAL_BASED_OUTPATIENT_CLINIC_OR_DEPARTMENT_OTHER): Payer: Self-pay | Admitting: Physical Therapy

## 2022-03-11 DIAGNOSIS — R2689 Other abnormalities of gait and mobility: Secondary | ICD-10-CM

## 2022-03-11 DIAGNOSIS — M6281 Muscle weakness (generalized): Secondary | ICD-10-CM | POA: Diagnosis not present

## 2022-03-11 DIAGNOSIS — G8929 Other chronic pain: Secondary | ICD-10-CM
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
## 2022-03-12 ENCOUNTER — Ambulatory Visit: Payer: PPO | Admitting: Physical Therapy

## 2022-03-12 ENCOUNTER — Encounter: Payer: Self-pay | Admitting: Physical Therapy

## 2022-03-12 DIAGNOSIS — R2689 Other abnormalities of gait and mobility: Secondary | ICD-10-CM

## 2022-03-12 DIAGNOSIS — G8929 Other chronic pain: Secondary | ICD-10-CM

## 2022-03-12 DIAGNOSIS — M6281 Muscle weakness (generalized): Secondary | ICD-10-CM

## 2022-03-12 NOTE — Telephone Encounter (Signed)
Medication has been refilled by [facility] on 03/08/22.  Pt has TOC appt scheduled for 05/12/22 with Dr [name].
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
## 2022-03-17 ENCOUNTER — Encounter (HOSPITAL_BASED_OUTPATIENT_CLINIC_OR_DEPARTMENT_OTHER): Payer: Self-pay | Admitting: Physical Therapy

## 2022-03-17 ENCOUNTER — Ambulatory Visit (HOSPITAL_BASED_OUTPATIENT_CLINIC_OR_DEPARTMENT_OTHER): Payer: PPO | Admitting: Physical Therapy

## 2022-03-17 DIAGNOSIS — R2689 Other abnormalities of gait and mobility: Secondary | ICD-10-CM

## 2022-03-17 DIAGNOSIS — M6281 Muscle weakness (generalized): Secondary | ICD-10-CM | POA: Diagnosis not present

## 2022-03-17 DIAGNOSIS — G8929 Other chronic pain: Secondary | ICD-10-CM
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
## 2022-03-19 ENCOUNTER — Ambulatory Visit: Payer: PPO | Admitting: Physical Therapy

## 2022-03-19 ENCOUNTER — Encounter: Payer: Self-pay | Admitting: Physical Therapy

## 2022-03-19 DIAGNOSIS — R2689 Other abnormalities of gait and mobility: Secondary | ICD-10-CM

## 2022-03-19 DIAGNOSIS — M6281 Muscle weakness (generalized): Secondary | ICD-10-CM | POA: Diagnosis not present

## 2022-03-19 DIAGNOSIS — G8929 Other chronic pain: Secondary | ICD-10-CM
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
## 2022-03-23 DIAGNOSIS — G894 Chronic pain syndrome: Secondary | ICD-10-CM | POA: Diagnosis not present

## 2022-03-23 DIAGNOSIS — Z79891 Long term (current) use of opiate analgesic: Secondary | ICD-10-CM | POA: Diagnosis not present

## 2022-03-23 DIAGNOSIS — M542 Cervicalgia: Secondary | ICD-10-CM | POA: Diagnosis not present

## 2022-03-23 DIAGNOSIS — G90512 Complex regional pain syndrome I of left upper limb: Secondary | ICD-10-CM | POA: Diagnosis not present

## 2022-03-23 NOTE — Progress Notes (Signed)
Carelink Summary Report / Loop Recorder 

## 2022-03-25 ENCOUNTER — Ambulatory Visit: Payer: PPO | Attending: Family Medicine | Admitting: Physical Therapy

## 2022-03-25 ENCOUNTER — Encounter: Payer: Self-pay | Admitting: Physical Therapy

## 2022-03-25 DIAGNOSIS — M6281 Muscle weakness (generalized): Secondary | ICD-10-CM | POA: Insufficient documentation

## 2022-03-25 DIAGNOSIS — R2689 Other abnormalities of gait and mobility: Secondary | ICD-10-CM | POA: Diagnosis not present

## 2022-03-25 DIAGNOSIS — M25512 Pain in left shoulder: Secondary | ICD-10-CM | POA: Insufficient documentation

## 2022-03-25 DIAGNOSIS — M25562 Pain in left knee: Secondary | ICD-10-CM | POA: Insufficient documentation

## 2022-03-25 DIAGNOSIS — G8929 Other chronic pain: Secondary | ICD-10-CM | POA: Diagnosis not present
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
## 2022-03-26 ENCOUNTER — Ambulatory Visit (HOSPITAL_BASED_OUTPATIENT_CLINIC_OR_DEPARTMENT_OTHER): Payer: PPO | Attending: Family Medicine | Admitting: Physical Therapy

## 2022-03-26 ENCOUNTER — Encounter (HOSPITAL_BASED_OUTPATIENT_CLINIC_OR_DEPARTMENT_OTHER): Payer: Self-pay | Admitting: Physical Therapy

## 2022-03-26 DIAGNOSIS — M25562 Pain in left knee: Secondary | ICD-10-CM | POA: Diagnosis not present

## 2022-03-26 DIAGNOSIS — M25512 Pain in left shoulder: Secondary | ICD-10-CM | POA: Insufficient documentation

## 2022-03-26 DIAGNOSIS — R2689 Other abnormalities of gait and mobility: Secondary | ICD-10-CM | POA: Insufficient documentation

## 2022-03-26 DIAGNOSIS — M6281 Muscle weakness (generalized): Secondary | ICD-10-CM | POA: Diagnosis not present

## 2022-03-26 DIAGNOSIS — G8929 Other chronic pain: Secondary | ICD-10-CM | POA: Insufficient documentation
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
## 2022-03-29 LAB — CUP PACEART REMOTE DEVICE CHECK
Date Time Interrogation Session: 20230708230353
Implantable Pulse Generator Implant Date: 20221006

## 2022-03-31 ENCOUNTER — Encounter (HOSPITAL_BASED_OUTPATIENT_CLINIC_OR_DEPARTMENT_OTHER): Payer: Self-pay | Admitting: Physical Therapy

## 2022-03-31 ENCOUNTER — Ambulatory Visit (HOSPITAL_BASED_OUTPATIENT_CLINIC_OR_DEPARTMENT_OTHER): Payer: PPO | Admitting: Physical Therapy

## 2022-03-31 DIAGNOSIS — G8929 Other chronic pain: Secondary | ICD-10-CM

## 2022-03-31 DIAGNOSIS — M6281 Muscle weakness (generalized): Secondary | ICD-10-CM | POA: Diagnosis not present

## 2022-03-31 DIAGNOSIS — R2689 Other abnormalities of gait and mobility: Secondary | ICD-10-CM
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
## 2022-04-02 ENCOUNTER — Ambulatory Visit: Payer: PPO | Admitting: Physical Therapy

## 2022-04-02 ENCOUNTER — Encounter: Payer: Self-pay | Admitting: Physical Therapy

## 2022-04-02 DIAGNOSIS — R2689 Other abnormalities of gait and mobility: Secondary | ICD-10-CM

## 2022-04-02 DIAGNOSIS — G8929 Other chronic pain: Secondary | ICD-10-CM

## 2022-04-02 DIAGNOSIS — M6281 Muscle weakness (generalized): Secondary | ICD-10-CM | POA: Diagnosis not present
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
## 2022-04-06 ENCOUNTER — Ambulatory Visit (INDEPENDENT_AMBULATORY_CARE_PROVIDER_SITE_OTHER): Payer: PPO

## 2022-04-06 ENCOUNTER — Telehealth: Payer: Self-pay | Admitting: Pharmacist

## 2022-04-06 DIAGNOSIS — R55 Syncope and collapse: Secondary | ICD-10-CM | POA: Diagnosis not present

## 2022-04-06 NOTE — Progress Notes (Signed)
Chronic Care Management Pharmacy Note  04/07/2022 Name:  Jo [name] MRN:  [id number] DOB:  1961-02-16  Summary: Pt reports she is concerned with her new rash  Recommendations/Changes made from today's visit: -Recommend DEXA scan as patient has never had previously -Consider genesight testing to determine best medication regimen and response -Recommended discussing treatment for rash with rheumatologist tomorrow  Plan: Follow up in 6 months   Subjective: Jo [name] is an 61 y.o. year old female who is a primary patient of Koberlein, Steele Berg, MD (Inactive).  The CCM team was consulted for assistance with disease management and care coordination needs.    Engaged with patient by telephone for follow up visit in response to provider referral for pharmacy case management and/or care coordination services.   Consent to Services:  The patient was given information about Chronic Care Management services, agreed to services, and gave verbal consent prior to initiation of services.  Please see initial visit note for detailed documentation.   Patient Care Team: Caren Macadam, MD (Inactive) as PCP - General (Family Medicine) Lorretta Harp, MD as PCP - Cardiology (Cardiology) Vickie Epley, MD as PCP - Electrophysiology (Cardiology) Leone Payor, MD as Referring Physician (Anesthesiology) Gatha Mayer, MD as Consulting Physician (Gastroenterology) Magrinat, Virgie Dad, MD (Inactive) as Consulting Physician (Oncology) [name], MD as Consulting Physician (Rheumatology) Jacelyn Pi, MD as Referring Physician (Endocrinology) Viona Gilmore, [facility] as Pharmacist (Pharmacist)  Recent office visits: 02/04/22 Caren Macadam, MD - Patient presented or Acute cystitis without hematuria and other concerns. Stopped doxycycline.  Recent consult visits: 04/02/22 Baruch Merl, PT (outpatient rehab): Patient presented for PT treatment for muscle weakness.    12/19/21 [name] - Patient presented for Chronic pain and other concerns. No medication changes.   12/10/21 Lennie Odor (Derm) - Claims encounter for Dermatographic urticaria and other concerns. No other visit details available.  11/03/21 Patient presented to [facility] for [facility] 10   10/29/21 Radene Gunning, MD (OBGYN) - Patient presented for Postmenopausal bleeding and other concerns. Stopped Fexofenadine HCL.  Hospital visits: Medication Reconciliation was completed by comparing discharge summary, patient's EMR and Pharmacy list, and upon discussion with patient.   Patient presented to [facility] on 11/11/21 due to D&C/ Hysteroscopy. Patient was present for 3 hours   New?Medications Started at [facility] Discharge:?? -started  none   Medication Changes at Hospital Discharge: -Changed  none   Medications Discontinued at Hospital Discharge: -Stopped  none   Medications that remain the same after Hospital Discharge:??  -All other medications will remain the same.    Objective:  Lab Results  Component Value Date   CREATININE 1.09 02/04/2022   BUN 21 02/04/2022   GFR 56.02 (L) 02/04/2022   GFRNONAA 60 (L) 04/28/2021   GFRAA >60 05/15/2020   NA 138 02/04/2022   K 4.6 02/04/2022   CALCIUM 9.7 02/04/2022   CO2 24 02/04/2022   GLUCOSE 95 02/04/2022    Lab Results  Component Value Date/Time   HGBA1C 5.6 11/15/2019 09:49 AM   GFR 56.02 (L) 02/04/2022 12:13 PM   GFR 63.00 10/23/2021 11:24 AM    Last diabetic Eye exam: No results found for: "HMDIABEYEEXA"  Last diabetic Foot exam: No results found for: "HMDIABFOOTEX"   Lab Results  Component Value Date   CHOL 180 10/23/2021   HDL 85.30 10/23/2021   LDLCALC 76 10/23/2021   TRIG 90.0 10/23/2021   CHOLHDL 2 10/23/2021  Latest Ref Rng & Units 12/02/2021   12:00 AM 10/23/2021   11:24 AM 08/28/2021   12:00 AM  Hepatic Function  Total Protein 6.0 - 8.3 g/dL  7.1    Albumin 3.5 -  5.0 4.0     4.4  4.3      AST 13 - 35 $Re'16     15  17      'aFc$ ALT 7 - 35 U/L $Remo'10     5  11      'LEMNY$ Alk Phosphatase 25 - 125 68     45  59      Total Bilirubin 0.2 - 1.2 mg/dL  0.3       This result is from an external source.    Lab Results  Component Value Date/Time   TSH 2.53 10/23/2021 11:24 AM   TSH 1.260 03/11/2021 03:36 PM       Latest Ref Rng & Units 02/04/2022   12:13 PM 12/02/2021   12:00 AM 10/23/2021   11:24 AM  CBC  WBC 4.0 - 10.5 K/uL 5.5  5.5     4.3   Hemoglobin 12.0 - 15.0 g/dL 13.3  13.1     12.6   Hematocrit 36.0 - 46.0 % 39.9  40     38.4   Platelets 150.0 - 400.0 K/uL 274.0  241     231.0      This result is from an external source.    Lab Results  Component Value Date/Time   VD25OH 99.35 10/23/2021 11:24 AM   VD25OH 83.61 04/27/2018 03:38 PM    Clinical ASCVD: No  The 10-year ASCVD risk score (Arnett DK, et al., 2019) is: 2.6%   Values used to calculate the score:     Age: 78 years     Sex: Female     Is Non-Hispanic African American: No     Diabetic: Yes     Tobacco smoker: No     Systolic Blood Pressure: 563 mmHg     Is BP treated: No     HDL Cholesterol: 85.3 mg/dL     Total Cholesterol: 180 mg/dL       10/29/2021   11:01 AM 09/03/2021   11:11 AM 02/05/2021    4:38 PM  Depression screen PHQ 2/9  Decreased Interest 0 0 0  Down, Depressed, Hopeless 0 1 0  PHQ - 2 Score 0 1 0  Altered sleeping 0 0 2  Tired, decreased energy $RemoveBeforeDE'2 3 3  'vHEskqfaJJgNKzY$ Change in appetite 0 3 3  Feeling bad or failure about yourself  1 0 1  Trouble concentrating 0 2 2  Moving slowly or fidgety/restless 0 0   Suicidal thoughts 0 0 0  PHQ-9 Score $RemoveBef'3 9 11      'MRYFyYLiFR$ Social History   Tobacco Use  Smoking Status Never  Smokeless Tobacco Never   BP Readings from Last 3 Encounters:  02/04/22 112/72  11/11/21 120/78  10/29/21 124/80   Pulse Readings from Last 3 Encounters:  02/04/22 75  11/11/21 65  10/29/21 67   Wt Readings from Last 3 Encounters:  02/04/22 173 lb 8 oz (78.7 kg)   11/11/21 143 lb 6.4 oz (65 kg)  10/29/21 161 lb 3.2 oz (73.1 kg)   BMI Readings from Last 3 Encounters:  02/04/22 29.78 kg/m  11/11/21 24.61 kg/m  10/29/21 27.67 kg/m    Assessment/Interventions: Review of patient past medical history, allergies, medications, health status, including review of consultants reports, laboratory and other  test data, was performed as part of comprehensive evaluation and provision of chronic care management services.   SDOH:  (Social Determinants of Health) assessments and interventions performed: Yes  SDOH Screenings   Alcohol Screen: Low Risk  (10/09/2020)   Alcohol Screen    Last Alcohol Screening Score (AUDIT): 0  Depression (PHQ2-9): Low Risk  (10/29/2021)   Depression (PHQ2-9)    PHQ-2 Score: 3  Recent Concern: Depression (PHQ2-9) - Medium Risk (09/03/2021)   Depression (PHQ2-9)    PHQ-2 Score: 9  Financial Resource Strain: High Risk (09/03/2021)   Overall Financial Resource Strain (CARDIA)    Difficulty of Paying Living Expenses: Hard  Food Insecurity: Food Insecurity Present (09/03/2021)   Hunger Vital Sign    Worried About Running Out of Food in the Last Year: Sometimes true    Ran Out of Food in the Last Year: Never true  Housing: Low Risk  (09/03/2021)   Housing    Last Housing Risk Score: 0  Physical Activity: Unknown (09/03/2021)   Exercise Vital Sign    Days of Exercise per Week: 0 days    Minutes of Exercise per Session: Not on file  Social Connections: Moderately Integrated (09/03/2021)   Social Connection and Isolation Panel [NHANES]    Frequency of Communication with Friends and Family: More than three times a week    Frequency of Social Gatherings with Friends and Family: Once a week    Attends Religious Services: More than 4 times per year    Active Member of [employer] or Organizations: Yes    Attends Archivist Meetings: More than 4 times per year    Marital Status: Divorced  Stress: Stress Concern Present  (09/03/2021)   [employer] [place]    Feeling of Stress : To some extent  Tobacco Use: Low Risk  (04/02/2022)   Patient History    Smoking Tobacco Use: Never    Smokeless Tobacco Use: Never    Passive Exposure: Not on file  Transportation Needs: No Transportation Needs (09/03/2021)   PRAPARE - Transportation    Lack of Transportation (Medical): No    Lack of Transportation (Non-Medical): No    CCM Care Plan  Allergies  Allergen Reactions   Bee Pollen Anaphylaxis   Diazepam     sucidal depression   Prednisone Nausea And Vomiting    Per pt can not take oral steroids    Amoxicillin     Severe diarrhea At high dose    Doxycycline     Pt does not remember reaction   Emetrol     Other reaction(s): GI upset   Erythromycin     n & v   Gabapentin     Doesn't remember; didn't tolerate   Imuran [Azathioprine]     Patient states this was discontinued due to liver problems   Methotrexate Derivatives     diarrhea   Morphine     n & v   Propoxyphene N-Acetaminophen     n & V   Sulfa Antibiotics     MIGRAINES   Tetracyclines & Related     N & v   Propoxyphene Other (See Comments)    Medications Reviewed Today     Reviewed by Scheryl Darter, PT (Physical Therapist) on 04/02/22 at 1246  Med List Status: <None>   Medication Order Taking? Sig Documenting Provider Last Dose Status Informant  azelastine (ASTELIN) 0.1 % nasal spray [id number]  instill one SPRAY into  BOTH nostrils twice daily Koberlein, Junell C, MD  Active   B-D 3CC LUER-LOK SYR 23GX1" 23G X 1" 3 ML MISC [id number]  Inject 2 Needles into the skin See admin instructions. Dutch Quint B, FNP  Active   Belimumab [facility]) 200 MG/ML SOSY [id number] No once a week. [provider] Taking Active   betamethasone dipropionate 0.05 % lotion [id number] No Apply topically as needed. [provider] Taking Active   busPIRone (BUSPAR) 15 MG tablet  [id number]  TAKE 1/2 TABLET BY MOUTH EVERY MORNING and TAKE 1/2 TABLET BY MOUTH EVERYDAY AT BEDTIME Koberlein, Junell C, MD  Active   cholecalciferol (VITAMIN D) 1000 UNITS tablet [id number] No Take 2,000 Units by mouth daily. [provider] Taking Active Self  clindamycin (CLEOCIN T) 1 % lotion [id number] No Apply 1 application topically 2 (two) times daily as needed. [provider] Taking Active Self  Cranberry 200 MG CAPS [id number] No Take 200 mg by mouth 2 (two) times daily. [provider] Taking Active Self           Med Note Alvin Critchley   Wed Feb 05, 2021 10:45 AM)    cyanocobalamin (,VITAMIN B-12,) 1000 MCG/ML injection [id number]  Inject 1 mL (1,000 mcg total) into the muscle every 30 (thirty) days. Dutch Quint B, FNP  Active   erythromycin with ethanol (EMGEL) 2 % gel [id number] No Apply topically 2 (two) times daily.  Patient taking differently: Apply topically 2 (two) times daily as needed.   Caren Macadam, MD Taking Active   estradiol (ESTRACE) 2 MG tablet [id number] No TAKE ONE TABLET BY MOUTH ONCE DAILY Koberlein, Junell C, MD Taking Active   fluticasone (CUTIVATE) 0.05 % cream [id number] No Apply 1 application topically 2 (two) times daily as needed (rash).  [provider] Taking Active Self  fluticasone (FLONASE) 50 MCG/ACT nasal spray [id number]  USE TWO SPRAYS IN EACH NOSTRIL DAILY AS DIRECTED Koberlein, Junell C, MD  Active   folic acid (FOLVITE) 1 MG tablet [id number] No Take 1 tablet (1 mg total) by mouth daily.  Patient taking differently: Take 1 mg by mouth in the morning and at bedtime.   Gardenia Phlegm, NP Taking Active   HYDROmorphone (DILAUDID) 4 MG tablet [id number] No Take by mouth as needed for severe pain. [provider] Taking Active   ketorolac (TORADOL) 30 MG/ML injection [id number] No Inject 1 mL (30 mg total) into the muscle daily as needed for moderate pain.  Patient not taking: Reported on 02/04/2022    Caren Macadam, MD Not Taking Active   lamoTRIgine (LAMICTAL) 100 MG tablet [id number]  TAKE ONE TABLET BY MOUTH AT BREAKFAST AND AT BEDTIME Koberlein, Junell C, MD  Active   lansoprazole (PREVACID) 30 MG capsule [id number]  Take 1 capsule (30 mg total) by mouth 2 (two) times daily before a meal. Koberlein, Junell C, MD  Active   levocetirizine (XYZAL) 5 MG tablet [id number] No Take 5 mg by mouth daily. [provider] Taking Active   levofloxacin (LEVAQUIN) 500 MG tablet [id number] No Take 1 tablet (500 mg total) by mouth daily. Caren Macadam, MD Taking Active   levothyroxine (SYNTHROID) 125 MCG tablet [id number]  Take 1 tablet (125 mcg total) by mouth every morning. Caren Macadam, MD  Active   LYSINE PO [id number] No Take 1 tablet by mouth daily. [provider] Taking Active   medroxyPROGESTERone (PROVERA) 2.5 MG tablet [id number] No Take 1 tablet PO daily Koberlein, Junell  C, MD Taking Active   methadone (DOLOPHINE) 5 MG tablet [id number] No Take 1 tablet by mouth 2 (two) times daily. [provider] Taking Active   metoCLOPramide (REGLAN) 10 MG tablet [id number] No Take 1 tablet (10 mg total) by mouth every 6 (six) hours as needed for nausea. Gatha Mayer, MD Taking Active   ondansetron [facility]) 8 MG tablet [id number] No Take 1 tablet (8 mg total) by mouth every 8 (eight) hours as needed for nausea or vomiting. Gatha Mayer, MD Taking Active   promethazine (PHENERGAN) 25 MG tablet [id number] No TAKE ONE TABLET BY MOUTH FOR 8 HOURS AS NEEDED Gatha Mayer, MD Taking Active   sertraline (ZOLOFT) 100 MG tablet [id number]  Take 2 tablets (200 mg total) by mouth every morning. Caren Macadam, MD  Active   sucralfate (CARAFATE) 1 g tablet [id number] No Take 1 tablet (1 g total) by mouth 3 (three) times daily as needed for up to 14 days.  Patient taking differently: Take 1 g by mouth as needed.   Gardenia Phlegm, NP Taking Expired 10/29/21 2359    tiZANidine (ZANAFLEX) 4 MG capsule [id number]  Take 3 capsules (12 mg total) by mouth at bedtime as needed for muscle spasms. Caren Macadam, MD  Active   topiramate (TOPAMAX) 25 MG tablet [id number] No Take by mouth. [provider] Taking Active   triamcinolone cream (KENALOG) 0.1 % [id number] No daily as needed. [provider] Taking Active             Patient Active Problem List   Diagnosis Date Noted   Endometrial polyp    Postmenopausal bleeding 11/08/2021   Raynaud phenomenon 10/23/2021   Anemia 10/23/2021   Palpitations 06/18/2021   Depression, recurrent ([place]) 07/22/2020   Systemic lupus erythematosus ([place]) 04/29/2020   Rheumatoid arthritis ([place]) 12/21/2019   Fibromyalgia 12/21/2019   Iron deficiency 12/13/2019   Reflex sympathetic dystrophy of lower extremity 06/12/2019   Falls 09/25/2015   Memory changes 09/25/2015   Polycystic ovaries 07/11/2013   Hypothyroidism 05/04/2013   Complex regional pain syndrome I, unspecified 03/06/2013   History of colonic polyps 02/25/2009   DEPRESSION/ANXIETY 01/14/2008   Cough variant asthma 01/14/2008   Gastro-esophageal reflux disease without esophagitis 01/14/2008   Irritable colon 01/14/2008    Immunization History  Administered Date(s) Administered   Influenza, Quadrivalent, Recombinant, Inj, Pf 06/18/2020   Influenza, Seasonal, Injecte, Preservative Fre 06/21/2014, 06/26/2015, 07/29/2016   Influenza,inj,Quad PF,6+ Mos 07/15/2017, 06/15/2018, 06/13/2019   Influenza,inj,quad, With Preservative 07/11/2014, 07/19/2017   Influenza,trivalent, recombinat, inj, PF 06/14/2013   Influenza-Unspecified 06/18/2020, 06/02/2021   PFIZER(Purple Top)SARS-COV-2 Vaccination 12/28/2019, 01/25/2020, 08/06/2020, 02/03/2021, 06/02/2021   Pneumococcal Polysaccharide-23 10/09/2015   Tdap 10/23/2007   Zoster Recombinat (Shingrix) 11/01/2020, 01/10/2021   Patient reports her biggest concern right now is the rash she broke  out in yesterday. She was unsure what it was but now thinks it's poison oak. She has an appointment with rheumatology tomorrow and will ask about it. In the meantime, she is using a steroid cream and may use Benadryl.  Conditions to be addressed/monitored:  GERD, Asthma, Hypothyroidism, Depression, Anxiety, Allergic Rhinitis, and PCOS, iron deficiency, vitamin B12 deficiency, chronic pain, lupus  Conditions addressed this visit: Lupus, GERD, hypothyroidism  Care Plan : CCM Pharmacy Care Plan  Updates made by Viona Gilmore, [place] since 04/07/2022 12:00 AM     Problem: Problem: GERD, Asthma, Hypothyroidism, Depression, Anxiety, Allergic Rhinitis, and PCOS, iron deficiency, vitamin B12 deficiency, chronic  pain, lupus      Long-Range Goal: Patient-Specific Goal   Start Date: 03/10/2021  Expected End Date: 03/10/2022  Recent Progress: On track  Priority: High  Note:   Current Barriers:  Unable to independently monitor therapeutic efficacy  Pharmacist Clinical Goal(s):  Patient will achieve adherence to monitoring guidelines and medication adherence to achieve therapeutic efficacy through collaboration with PharmD and provider.   Interventions: 1:1 collaboration with Caren Macadam, MD regarding development and update of comprehensive plan of care as evidenced by provider attestation and co-signature Inter-disciplinary care team collaboration (see longitudinal plan of care) Comprehensive medication review performed; medication list updated in electronic medical record  Asthma (Goal: control symptoms) -Controlled -Current treatment  None -Medications previously tried: patient states previous nebulizers and inhalers have not worked for her (makes her shaky), montelukast  -Pulmonary function testing: n/a -Exacerbations requiring treatment in last 6 months: none -Patient denies consistent use of maintenance inhaler -Frequency of rescue inhaler use: does not have -Counseled on   benefits of having a rescue inhaler -Recommended to continue current medication  Hypothyroidism (Goal: TSH 0.35-4.5) -Controlled -Current treatment  levothyroxine 155mcg, 1 tablet once daily - Appropriate, Effective, Safe, Accessible -Medications previously tried: none  -Counseled on consistent administration of levothyroxine (regards to food and other medications)   Iron deficiency (Goal: ferritin 46-336) -Controlled -Current treatment  Ferumoxytol (FERAHEME) infusion - not currently on -Medications previously tried: n/a  -Recommended to continue current medication  PCOS (Goal: minimize symptoms) -Controlled -Current treatment  No medications -Medications previously tried: metformin (tapered off with GI symptoms)  -Recommended to continue as is  Folate deficiency (Goal: normal folate) -Controlled -Current treatment  folic acid 1 mg, 1 tablet once daily - Appropriate, Effective, Safe, Accessible -Medications previously tried: none  -Recommended to continue current medication  Lupus (Goal: minimize symptoms) -Controlled -Current treatment  Benlysta - Appropriate, Effective, Safe, Accessible  -Medications previously tried: methotrexate (n/v and fatigue) -Recommended to continue current medication  GI health (GERD, nausea, GI spasms (Goal: minimize symptoms)) -Uncontrolled -Current treatment  Zegerid 20-1100mg , 1 capsule once daily  - Appropriate, Effective, Safe, Accessible sucralfate 1g, 1 tablet four times daily with meals and at bedtime - Appropriate, Effective, Safe, Accessible Metoclopramide 10mg , 1 tablet four times daily (patient has been using as needed) - Appropriate, Effective, Safe, Accessible ondansetron (Zofran) 4mg , 1 tablet every eight hours as needed for nausea - Appropriate, Effective, Safe, Accessible -Medications previously tried: n/a  -Recommended to continue current medication   Depression/Anxiety (Goal: minimize symptoms) -Not ideally  controlled -Current treatment: buspirone (Buspar) 15mg , 0.5 tablet (7.5mg ) twice daily - Appropriate, Effective, Safe, Accessible  sertraline 100mg , 2 tablets once daily - Appropriate, Query effective, Safe, Accessible lamotrigine 100mg ,1/2 tablet in AM and 1 tablet in PM - Appropriate, Query effective, Safe, Accessible -Medications previously tried/failed: n/a -PHQ9: 9 -GAD7: 4 -Educated on Benefits of medication for symptom control Benefits of cognitive-behavioral therapy with or without medication -Recommended to continue current medication Consider genesight testing to determine best medication regimen and response.  Pain (Goal: minimize pain) -Uncontrolled -Current treatment  hydromorphone 4mg , 1 tablet every 4 hours as needed for moderate pain or severe pain - Appropriate, Effective, Safe, Accessible methadone 10mg , (Patient reports taking: 2 tablet in the morning, 2 tablets around noon, and 1 tab at bedtime) - Appropriate, Effective, Safe, Accessible topiramate 25mg , 1 tablet in the morning and 3 tablets at bedtime - Appropriate, Effective, Safe, Accessible tizanidine 4mg , 4 capsules at bedtime as needed for muscle spasms - Appropriate,  Effective, Safe, Accessible  -Medications previously tried: Nucynta -Recommended to continue current medication  Menopausal symptoms (Goal: minimize symptoms) -Controlled -Current treatment  estradiol 2mg , 1 tablet once daily - Appropriate, Effective, Query Safe, Accessible medroxyprogesterone 2.5mg , 1 tablet daily - Appropriate, Effective, Safe, Accessible -Medications previously tried: none  - Consider estrogen patch based on lower incidence of long term risks.  Allergic rhinitis (Goal: minimize symptoms) -Controlled -Current treatment  azelastine (Astelin) 0.1% nasal spray, 1 spray into both nostrils twice daily  - Appropriate, Effective, Safe, Accessible Allegra, 180mg , 1 tablet at bedtime - Appropriate, Effective, Safe,  Accessible fluticasone (Flonase) 51mcg/act nasal spray, 2 sprays into each nostril every day - Appropriate, Effective, Safe, Accessible -Medications previously tried: none  -Recommended to continue current medication  Vitamin B12 deficiency (Goal: 211-911) -Controlled -Current treatment  cyanocobalamin 1000 mcg/ml, inject 1 ML into muscle every 2 weeks for 2 doses and then once every month - Appropriate, Effective, Safe, Accessible -Medications previously tried: none  -Recommended to continue current medication Recommended repeat vitamin B12 level   Health Maintenance -Vaccine gaps: 2nd shingrix dose, tetanus -Current therapy:  Cranberry 4200 mg 1 capsule daily Vitamin D 2000 units daily Lysine 1 tablet daily -Educated on Cost vs benefit of each product must be carefully weighed by individual consumer -Patient is satisfied with current therapy and denies issues -Recommended to continue current medication  Patient Goals/Self-Care Activities Patient will:  - take medications as prescribed  Follow Up Plan: Telephone follow up appointment with care management team member scheduled for: 6 months      Medication Assistance: None required.  Patient affirms current coverage meets needs.  Compliance/Adherence/Medication fill history: Care Gaps: COVID booster, tetanus, HIV screening, PCV vaccine, eye exam Last BP - 112/72 on 02/04/2022  Star-Rating Drugs: None  Patient's preferred pharmacy is:  [facility] - [place], [place] - [address] [place] [address] Phone: [phone] Fax: [phone]   Uses pill box? No - adherence packaging Pt endorses 100% compliance  We discussed: Benefits of medication synchronization, packaging and delivery as well as enhanced pharmacist oversight with [facility]. Patient decided to: Utilize [facility] pharmacy for medication synchronization, packaging and delivery  Care Plan and  Follow Up Patient Decision:  Patient agrees to Care Plan and Follow-up.  Plan: Telephone follow up appointment with care management team member scheduled for:  6 months  [name], PharmD, [place] Pharmacist [place] at [place] [phone]

## 2022-04-06 NOTE — Chronic Care Management (AMB) (Signed)
    Chronic Care Management Pharmacy Assistant   Name: Jo [name]  MRN: [id number] DOB: 1961-01-14  04/07/2022 APPOINTMENT REMINDER  Called [name], No answer, left message of appointment on 04/07/2022 at 1:00 via telephone visit with [name], Pharm D. Notified to have all medications, supplements, blood pressure and/or blood sugar logs available during appointment and to return call if need to reschedule.  Care Gaps: AWV - message sent to [name] Last BP - 112/72 on 02/04/2022 Eye exam - overdue Covid booster - overdue Tdap - postponed HIV Screen - postponed  Star Rating Drug: None  Any gaps in medications fill history? No  [name]  Catering manager [phone]

## 2022-04-07 ENCOUNTER — Ambulatory Visit: Payer: PPO | Admitting: Pharmacist

## 2022-04-07 DIAGNOSIS — E039 Hypothyroidism, unspecified: Secondary | ICD-10-CM

## 2022-04-07 DIAGNOSIS — F341 Dysthymic disorder: Secondary | ICD-10-CM

## 2022-04-07 NOTE — Patient Instructions (Signed)
Hi Rhythm,  It was great to catch up again! Let me know if you need anything before you see Dr. [name].  Please reach out to me if you have any questions or need anything before our follow up!  Best, Maddie  [name], PharmD, [place] [facility]   Visit Information   Goals Addressed   None    Patient Care Plan: CCM Pharmacy Care Plan     Problem Identified: Problem: GERD, Asthma, Hypothyroidism, Depression, Anxiety, Allergic Rhinitis, and PCOS, iron deficiency, vitamin B12 deficiency, chronic pain, lupus      Long-Range Goal: Patient-Specific Goal   Start Date: 03/10/2021  Expected End Date: 03/10/2022  Recent Progress: On track  Priority: High  Note:   Current Barriers:  Unable to independently monitor therapeutic efficacy  Pharmacist Clinical Goal(s):  Patient will achieve adherence to monitoring guidelines and medication adherence to achieve therapeutic efficacy through collaboration with PharmD and provider.   Interventions: 1:1 collaboration with Caren Macadam, MD regarding development and update of comprehensive plan of care as evidenced by provider attestation and co-signature Inter-disciplinary care team collaboration (see longitudinal plan of care) Comprehensive medication review performed; medication list updated in electronic medical record  Asthma (Goal: control symptoms) -Controlled -Current treatment  None -Medications previously tried: patient states previous nebulizers and inhalers have not worked for her (makes her shaky), montelukast  -Pulmonary function testing: n/a -Exacerbations requiring treatment in last 6 months: none -Patient denies consistent use of maintenance inhaler -Frequency of rescue inhaler use: does not have -Counseled on  benefits of having a rescue inhaler -Recommended to continue current medication  Hypothyroidism (Goal: TSH 0.35-4.5) -Controlled -Current treatment   levothyroxine 1108mg, 1 tablet once daily - Appropriate, Effective, Safe, Accessible -Medications previously tried: none  -Counseled on consistent administration of levothyroxine (regards to food and other medications)   Iron deficiency (Goal: ferritin 46-336) -Controlled -Current treatment  Ferumoxytol (FERAHEME) infusion - not currently on -Medications previously tried: n/a  -Recommended to continue current medication  PCOS (Goal: minimize symptoms) -Controlled -Current treatment  No medications -Medications previously tried: metformin (tapered off with GI symptoms)  -Recommended to continue as is  Folate deficiency (Goal: normal folate) -Controlled -Current treatment  folic acid 1 mg, 1 tablet once daily - Appropriate, Effective, Safe, Accessible -Medications previously tried: none  -Recommended to continue current medication  Lupus (Goal: minimize symptoms) -Controlled -Current treatment  Benlysta - Appropriate, Effective, Safe, Accessible  -Medications previously tried: methotrexate (n/v and fatigue) -Recommended to continue current medication  GI health (GERD, nausea, GI spasms (Goal: minimize symptoms)) -Uncontrolled -Current treatment  Zegerid 20-'1100mg'$ , 1 capsule once daily  - Appropriate, Effective, Safe, Accessible sucralfate 1g, 1 tablet four times daily with meals and at bedtime - Appropriate, Effective, Safe, Accessible Metoclopramide '10mg'$ , 1 tablet four times daily (patient has been using as needed) - Appropriate, Effective, Safe, Accessible ondansetron (Zofran) '4mg'$ , 1 tablet every eight hours as needed for nausea - Appropriate, Effective, Safe, Accessible -Medications previously tried: n/a  -Recommended to continue current medication   Depression/Anxiety (Goal: minimize symptoms) -Not ideally controlled -Current treatment: buspirone (Buspar) '15mg'$ , 0.5 tablet (7.'5mg'$ ) twice daily - Appropriate, Effective, Safe, Accessible  sertraline '100mg'$ , 2 tablets  once daily - Appropriate, Query effective, Safe, Accessible lamotrigine '100mg'$ ,1/2 tablet in AM and 1 tablet in PM - Appropriate, Query effective, Safe, Accessible -Medications previously tried/failed: n/a -PHQ9: 9 -GAD7: 4 -Educated on Benefits of medication for symptom control Benefits of cognitive-behavioral therapy with or without medication -  Recommended to continue current medication Consider genesight testing to determine best medication regimen and response.  Pain (Goal: minimize pain) -Uncontrolled -Current treatment  hydromorphone '4mg'$ , 1 tablet every 4 hours as needed for moderate pain or severe pain - Appropriate, Effective, Safe, Accessible methadone '10mg'$ , (Patient reports taking: 2 tablet in the morning, 2 tablets around noon, and 1 tab at bedtime) - Appropriate, Effective, Safe, Accessible topiramate '25mg'$ , 1 tablet in the morning and 3 tablets at bedtime - Appropriate, Effective, Safe, Accessible tizanidine '4mg'$ , 4 capsules at bedtime as needed for muscle spasms - Appropriate, Effective, Safe, Accessible  -Medications previously tried: Nucynta -Recommended to continue current medication  Menopausal symptoms (Goal: minimize symptoms) -Controlled -Current treatment  estradiol '2mg'$ , 1 tablet once daily - Appropriate, Effective, Query Safe, Accessible medroxyprogesterone 2.'5mg'$ , 1 tablet daily - Appropriate, Effective, Safe, Accessible -Medications previously tried: none  - Consider estrogen patch based on lower incidence of long term risks.  Allergic rhinitis (Goal: minimize symptoms) -Controlled -Current treatment  azelastine (Astelin) 0.1% nasal spray, 1 spray into both nostrils twice daily  - Appropriate, Effective, Safe, Accessible Allegra, '180mg'$ , 1 tablet at bedtime - Appropriate, Effective, Safe, Accessible fluticasone (Flonase) 39mg/act nasal spray, 2 sprays into each nostril every day - Appropriate, Effective, Safe, Accessible -Medications previously tried: none   -Recommended to continue current medication  Vitamin B12 deficiency (Goal: 211-911) -Controlled -Current treatment  cyanocobalamin 1000 mcg/ml, inject 1 ML into muscle every 2 weeks for 2 doses and then once every month - Appropriate, Effective, Safe, Accessible -Medications previously tried: none  -Recommended to continue current medication Recommended repeat vitamin B12 level   Health Maintenance -Vaccine gaps: 2nd shingrix dose, tetanus -Current therapy:  Cranberry 4200 mg 1 capsule daily Vitamin D 2000 units daily Lysine 1 tablet daily -Educated on Cost vs benefit of each product must be carefully weighed by individual consumer -Patient is satisfied with current therapy and denies issues -Recommended to continue current medication  Patient Goals/Self-Care Activities Patient will:  - take medications as prescribed  Follow Up Plan: Telephone follow up appointment with care management team member scheduled for: 6 months       Patient verbalizes understanding of instructions and care plan provided today and agrees to view in [facility] Active MyChart status and patient understanding of how to access instructions and care plan via MyChart confirmed with patient.    Telephone follow up appointment with pharmacy team member scheduled for: 6 months  MViona Gilmore [facility]

## 2022-04-08 ENCOUNTER — Telehealth: Payer: Self-pay | Admitting: Family Medicine

## 2022-04-08 ENCOUNTER — Ambulatory Visit: Payer: PPO | Admitting: Physical Therapy

## 2022-04-08 DIAGNOSIS — R21 Rash and other nonspecific skin eruption: Secondary | ICD-10-CM | POA: Diagnosis not present

## 2022-04-08 DIAGNOSIS — M1990 Unspecified osteoarthritis, unspecified site: Secondary | ICD-10-CM | POA: Diagnosis not present

## 2022-04-08 DIAGNOSIS — R768 Other specified abnormal immunological findings in serum: Secondary | ICD-10-CM | POA: Diagnosis not present

## 2022-04-08 DIAGNOSIS — R5383 Other fatigue: Secondary | ICD-10-CM | POA: Diagnosis not present

## 2022-04-08 DIAGNOSIS — G8929 Other chronic pain: Secondary | ICD-10-CM | POA: Diagnosis not present

## 2022-04-08 DIAGNOSIS — G9050 Complex regional pain syndrome I, unspecified: Secondary | ICD-10-CM | POA: Diagnosis not present

## 2022-04-08 DIAGNOSIS — K219 Gastro-esophageal reflux disease without esophagitis: Secondary | ICD-10-CM | POA: Diagnosis not present

## 2022-04-08 DIAGNOSIS — M797 Fibromyalgia: Secondary | ICD-10-CM | POA: Diagnosis not present

## 2022-04-08 DIAGNOSIS — M329 Systemic lupus erythematosus, unspecified: Secondary | ICD-10-CM | POA: Diagnosis not present

## 2022-04-08 DIAGNOSIS — M79652 Pain in left thigh: Secondary | ICD-10-CM | POA: Diagnosis not present

## 2022-04-08 DIAGNOSIS — I7300 Raynaud's syndrome without gangrene: Secondary | ICD-10-CM | POA: Diagnosis not present

## 2022-04-08 NOTE — Telephone Encounter (Signed)
Left message for patient to call back and schedule Medicare Annual Wellness Visit (AWV) either virtually or in office. Left  my Herbie Drape number [phone]   Last AWV ;10/09/20  please schedule at anytime with [facility] Nurse Health Advisor 1 or 2

## 2022-04-09 ENCOUNTER — Ambulatory Visit (HOSPITAL_BASED_OUTPATIENT_CLINIC_OR_DEPARTMENT_OTHER): Payer: PPO | Admitting: Physical Therapy

## 2022-04-09 DIAGNOSIS — L237 Allergic contact dermatitis due to plants, except food: Secondary | ICD-10-CM | POA: Diagnosis not present

## 2022-04-09 DIAGNOSIS — L239 Allergic contact dermatitis, unspecified cause: Secondary | ICD-10-CM | POA: Diagnosis not present

## 2022-04-09 NOTE — Telephone Encounter (Signed)
Returned patients call   Patient returned my call 7/19

## 2022-04-14 ENCOUNTER — Ambulatory Visit: Payer: PPO | Admitting: Physical Therapy

## 2022-04-16 ENCOUNTER — Ambulatory Visit (HOSPITAL_BASED_OUTPATIENT_CLINIC_OR_DEPARTMENT_OTHER): Payer: PPO | Admitting: Physical Therapy

## 2022-04-21 DIAGNOSIS — G894 Chronic pain syndrome: Secondary | ICD-10-CM | POA: Diagnosis not present

## 2022-04-21 DIAGNOSIS — Z79891 Long term (current) use of opiate analgesic: Secondary | ICD-10-CM | POA: Diagnosis not present

## 2022-04-21 DIAGNOSIS — G90512 Complex regional pain syndrome I of left upper limb: Secondary | ICD-10-CM | POA: Diagnosis not present

## 2022-04-21 DIAGNOSIS — M542 Cervicalgia: Secondary | ICD-10-CM | POA: Diagnosis not present

## 2022-04-28 ENCOUNTER — Other Ambulatory Visit: Payer: PPO

## 2022-04-28 ENCOUNTER — Ambulatory Visit: Payer: PPO | Admitting: Adult Health

## 2022-05-04 ENCOUNTER — Telehealth: Payer: Self-pay | Admitting: Family Medicine

## 2022-05-04 NOTE — Telephone Encounter (Signed)
Left message for patient to call back and schedule Medicare Annual Wellness Visit (AWV) either virtually or in office. Left  my Jo [name] number [phone]   Last AWV  10/09/20 ; please schedule at anytime with [facility] Nurse Health Advisor 1 or 2

## 2022-05-06 ENCOUNTER — Ambulatory Visit: Payer: PPO | Attending: Family Medicine | Admitting: Physical Therapy

## 2022-05-06 ENCOUNTER — Encounter: Payer: Self-pay | Admitting: Physical Therapy

## 2022-05-06 DIAGNOSIS — M25562 Pain in left knee: Secondary | ICD-10-CM | POA: Insufficient documentation

## 2022-05-06 DIAGNOSIS — R2689 Other abnormalities of gait and mobility: Secondary | ICD-10-CM | POA: Diagnosis not present

## 2022-05-06 DIAGNOSIS — G8929 Other chronic pain: Secondary | ICD-10-CM | POA: Diagnosis not present

## 2022-05-06 DIAGNOSIS — M6281 Muscle weakness (generalized): Secondary | ICD-10-CM | POA: Insufficient documentation

## 2022-05-06 DIAGNOSIS — M25512 Pain in left shoulder: Secondary | ICD-10-CM | POA: Diagnosis not present
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
## 2022-05-08 NOTE — Progress Notes (Signed)
Carelink Summary Report / Loop Recorder 

## 2022-05-11 ENCOUNTER — Ambulatory Visit (INDEPENDENT_AMBULATORY_CARE_PROVIDER_SITE_OTHER): Payer: PPO

## 2022-05-11 DIAGNOSIS — R55 Syncope and collapse: Secondary | ICD-10-CM | POA: Diagnosis not present

## 2022-05-11 NOTE — Progress Notes (Unsigned)
HPI: Jo [name] is a 61 y.o. female, who is here today to establish care.  Former PCP: Dr. Ethlyn Gallery Last preventive routine visit: Over a year ago. AWV 10/09/20.  Chronic medical problems: Seasonal allergies, SVT, SLE, reflex sympathetic dystrophy/complex regional pain syndrome I, chronic pain, GERD, migraine headaches, fibromyalgia, depression, and anxiety among some. She follows with rheumatologist every 3 months. Chronic pain (SLE, complex regional pain syndrome 1, and fibromyalgia) on topiramate 25 mg daily, methadone 5 mg twice daily, and hydromorphone 4 mg daily as needed.  She follows with pain management.  SVTs: History of syncopal episodes, usually when she is standing up, no prodrome symptoms prior to episodes.  In his having any syncopal episode while sitting down or while driving.  She has occasional palpitations with no associated CP or dyspnea. She has not identified exacerbating or alleviating factors. She has a loop recorder, follows with cardiologist regularly.    GERD and nausea: She is on Prevacid 30 mg twice daily, metoclopramide 10 mg 4 times daily as needed, Phenergan 25 mg, and Zofran 8 mg 3 times daily as needed. She follows with gastroenterologist.  Chronic headaches: Some associated with nausea, vomiting, photophobia.In average she has an episode every 1 to 2 months. Frontal,parietal,and occipital. She has milder headaches a few times per week. She has seen neurologist, Dr. Jaynee Eagles. Negative brain MRI in 09/2015.  Toradol 30 mg IM at home as needed, mainly for migraines but sometimes for arthralgias or neck pain.  Currently on PT, which has helped with aches and chronic neck pain.  Cervical MRI in 07/2015 showed mild degenerative changes at c4-c5 and c5-c6 without significant stenosis at any level.   She has had mildly abnormal renal function intermittently. She has blood work done every 3 months at her rheumatologist office.  Lab Results  Component  Value Date   CREATININE 1.09 02/04/2022   BUN 21 02/04/2022   NA 138 02/04/2022   K 4.6 02/04/2022   CL 106 02/04/2022   CO2 24 02/04/2022   Vasomotor menopausal symptoms: Currently she is on medroxyprogesterone 2.5 mg daily and estradiol 2 mg daily. While she is taking medication she is asymptomatic, symptoms reoccurred when she tried to discontinue medication. History of postmenopausal bleeding, she was evaluated by gynecologist on 10/29/2021, status post D&C and hysteroscopy in 11/2021.  Depression and anxiety: Currently she is on Lamictal 100 mg twice daily, sertraline 100 mg 2 tablets daily, and BuSpar 15 mg 1/2 tablet twice daily. She has been on sertraline since 2013-2014. Denies history of bipolar disorder.     10/29/2021   11:01 AM 09/03/2021   11:11 AM 02/05/2021    4:38 PM 10/09/2020    3:17 PM 12/03/2019   10:03 AM  Depression screen PHQ 2/9  Decreased Interest 0 0 0 0 2  Down, Depressed, Hopeless 0 1 0 0 1  PHQ - 2 Score 0 1 0 0 3  Altered sleeping 0 0 2 0 3  Tired, decreased energy '2 3 3 '$ 0 3  Change in appetite 0 3 3 0 3  Feeling bad or failure about yourself  1 0 1 0 0  Trouble concentrating 0 2 2 0 0  Moving slowly or fidgety/restless 0 0  0 0  Suicidal thoughts 0 0 0 0 0  PHQ-9 Score '3 9 11 '$ 0 12  Difficult doing work/chores    Not difficult at all Somewhat difficult   B12 defiency: She injects B12 1000 mcg monthly. Lab Results  Component Value Date   [id number] 10/23/2021   Allergic rhinitis: She states that recently she completed 45 days of antibiotic treatment for sinus infection. Currently she is on Flonase nasal spray daily as needed, Astelin nasal spray twice daily, and Xyzal 5 mg daily.  Hypothyroidism: Currently she is on levothyroxine 125 mcg daily. + Fatigue, chronic and stable. Negative for changes in bowel habits, tremor, or worsening palpitations.  Lab Results  Component Value Date   TSH 2.53 10/23/2021   Review of Systems   Constitutional:  Negative for activity change, appetite change and fever.  HENT:  Negative for mouth sores, nosebleeds and sore throat.   Eyes:  Negative for pain and visual disturbance.  Respiratory:  Negative for cough and wheezing.   Cardiovascular:  Negative for leg swelling.  Gastrointestinal:  Negative for abdominal pain, nausea and vomiting.       Negative for changes in bowel habits.  Endocrine: Negative for cold intolerance and heat intolerance.  Genitourinary:  Negative for decreased urine volume, dysuria and hematuria.  Musculoskeletal:  Positive for arthralgias, myalgias and neck pain. Negative for gait problem.  Skin:  Negative for rash.  Allergic/Immunologic: Positive for environmental allergies.  Neurological:  Negative for seizures and facial asymmetry.  Psychiatric/Behavioral:  Negative for confusion and hallucinations.   Rest see pertinent positives and negatives per HPI.  Current Outpatient Medications on File Prior to Visit  Medication Sig Dispense Refill   azelastine (ASTELIN) 0.1 % nasal spray instill one SPRAY into BOTH nostrils twice daily 30 mL 5   B-D 3CC LUER-LOK SYR 23GX1" 23G X 1" 3 ML MISC Inject 2 Needles into the skin See admin instructions. 6 each 3   Belimumab (BENLYSTA) 200 MG/ML SOSY once a week.     betamethasone dipropionate 0.05 % lotion Apply topically as needed.     busPIRone (BUSPAR) 15 MG tablet TAKE 1/2 TABLET BY MOUTH EVERY MORNING and TAKE 1/2 TABLET BY MOUTH EVERYDAY AT BEDTIME 90 tablet 1   cholecalciferol (VITAMIN D) 1000 UNITS tablet Take 2,000 Units by mouth daily.     clindamycin (CLEOCIN T) 1 % lotion Apply 1 application topically 2 (two) times daily as needed.     Cranberry 200 MG CAPS Take 200 mg by mouth 2 (two) times daily.     cyanocobalamin (,VITAMIN B-12,) 1000 MCG/ML injection Inject 1 mL (1,000 mcg total) into the muscle every 30 (thirty) days. 3 mL 1   erythromycin with ethanol (EMGEL) 2 % gel Apply topically 2 (two) times  daily. (Patient taking differently: Apply topically 2 (two) times daily as needed.) 30 g 1   fluticasone (CUTIVATE) 0.05 % cream Apply 1 application topically 2 (two) times daily as needed (rash).      fluticasone (FLONASE) 50 MCG/ACT nasal spray USE TWO SPRAYS IN EACH NOSTRIL DAILY AS DIRECTED 16 g 5   folic acid (FOLVITE) 1 MG tablet Take 1 tablet (1 mg total) by mouth daily. (Patient taking differently: Take 1 mg by mouth in the morning and at bedtime.) 30 tablet 6   HYDROmorphone (DILAUDID) 4 MG tablet Take by mouth as needed for severe pain.     ketorolac (TORADOL) 30 MG/ML injection Inject 1 mL (30 mg total) into the muscle daily as needed for moderate pain. 5 mL 2   lamoTRIgine (LAMICTAL) 100 MG tablet TAKE ONE TABLET BY MOUTH AT BREAKFAST AND AT BEDTIME 180 tablet 1   lansoprazole (PREVACID) 30 MG capsule Take 1 capsule (30 mg total) by mouth  2 (two) times daily before a meal. 180 capsule 3   levocetirizine (XYZAL) 5 MG tablet Take 5 mg by mouth daily.     levothyroxine (SYNTHROID) 125 MCG tablet Take 1 tablet (125 mcg total) by mouth every morning. 90 tablet 1   LYSINE PO Take 1 tablet by mouth daily.     medroxyPROGESTERone (PROVERA) 2.5 MG tablet Take 1 tablet PO daily 90 tablet 1   methadone (DOLOPHINE) 5 MG tablet Take 1 tablet by mouth 2 (two) times daily.     metoCLOPramide (REGLAN) 10 MG tablet Take 1 tablet (10 mg total) by mouth every 6 (six) hours as needed for nausea. 30 tablet 0   ondansetron (ZOFRAN) 8 MG tablet Take 1 tablet (8 mg total) by mouth every 8 (eight) hours as needed for nausea or vomiting. 60 tablet 5   promethazine (PHENERGAN) 25 MG tablet TAKE ONE TABLET BY MOUTH FOR 8 HOURS AS NEEDED 20 tablet 3   sertraline (ZOLOFT) 100 MG tablet Take 2 tablets (200 mg total) by mouth every morning. 180 tablet 1   tiZANidine (ZANAFLEX) 4 MG capsule Take 3 capsules (12 mg total) by mouth at bedtime as needed for muscle spasms. 90 capsule 1   topiramate (TOPAMAX) 25 MG tablet  Take by mouth.     triamcinolone cream (KENALOG) 0.1 % daily as needed.     sucralfate (CARAFATE) 1 g tablet Take 1 tablet (1 g total) by mouth 3 (three) times daily as needed for up to 14 days. (Patient taking differently: Take 1 g by mouth as needed.) 56 tablet 0   No current facility-administered medications on file prior to visit.   Past Medical History:  Diagnosis Date   Allergy    Anemia    Anxiety    Anxiety and depression    Arrhythmia 2021   Arthritis    Asthma    Cervical cancer [facility])    age 55    Chronic headaches    Colon polyp    COPD (chronic obstructive pulmonary disease) (HCC)    no o2   Depression    Dysrhythmia    Endometriosis    Fibromyalgia    Fundic gland polyps of stomach, benign    Gastritis    GERD (gastroesophageal reflux disease)    H. pylori infection 2013   H/O gastric ulcer 1985   History of anorexia nervosa    History of bulimia    History of pneumonia    Hypothyroidism    Irritable bowel syndrome (IBS)    Obesity    Personal history of other mental and behavioral disorders 01/14/2008   Qualifier: Diagnosis of  By: Carlean Purl MD, [name]   Overview:  Overview:  Annotation: BULIMIA Qualifier: Diagnosis of  By: Bertram Gala    PONV (postoperative nausea and vomiting)    iv phenergan helps   REFLEX SYMPATHETIC DYSTROPHY 02/26/2009   Qualifier: Diagnosis of  By: Carlean Purl MD, [name]    Rheumatoid arteritis (HCC)    RSD (reflex sympathetic dystrophy)    Sleep apnea    Systemic lupus ([place]) 2021   Ulcer    gastric, duodenal ulcer   Allergies  Allergen Reactions   Bee Pollen Anaphylaxis   Diazepam     sucidal depression   Prednisone Nausea And Vomiting    Per pt can not take oral steroids    Amoxicillin     Severe diarrhea At high dose    Doxycycline  Pt does not remember reaction   Emetrol     Other reaction(s): GI upset   Erythromycin     n & v   Gabapentin     Doesn't remember; didn't tolerate   Imuran  [Azathioprine]     Patient states this was discontinued due to liver problems   Methotrexate Derivatives     diarrhea   Morphine     n & v   Propoxyphene N-Acetaminophen     n & V   Sulfa Antibiotics     MIGRAINES   Tetracyclines & Related     N & v   Propoxyphene Other (See Comments)   Family History  Problem Relation Age of Onset   Colon polyps Mother        part of intestines removed   Raynaud syndrome Mother    Irritable bowel syndrome Mother    Other Mother        cold agluten   Stroke Mother    Colon polyps Sister    Breast cancer Maternal Aunt        first time 67s second time 33s   Lung cancer Maternal Grandfather    Colon polyps Brother    Kidney Stones Brother    Colon cancer Neg Hx    Esophageal cancer Neg Hx    Rectal cancer Neg Hx    Stomach cancer Neg Hx    Migraines Neg Hx    Seizures Neg Hx    Social History   Socioeconomic History   Marital status: Divorced    Spouse name: Not on file   Number of children: 0   Years of education: 16   Highest education level: Bachelor's degree (e.g., BA, AB, BS)  Occupational History   Occupation: Dance movement psychotherapist: UNEMPLOYED  Tobacco Use   Smoking status: Never   Smokeless tobacco: Never  Vaping Use   Vaping Use: Never used  Substance and Sexual Activity   Alcohol use: No   Drug use: No   Sexual activity: Not Currently    Birth control/protection: None  Other Topics Concern   Not on file  Social History Narrative   Lives at home alone.   Caffeine use: Drinks coffee- 1 cup in morning   Ginger ale   Social Determinants of Health   Financial Resource Strain: High Risk (09/03/2021)   Overall Financial Resource Strain (CARDIA)    Difficulty of Paying Living Expenses: Hard  Food Insecurity: Food Insecurity Present (09/03/2021)   Hunger Vital Sign    Worried About Running Out of Food in the Last Year: Sometimes true    Ran Out of Food in the Last Year: Never true  Transportation  Needs: No Transportation Needs (09/03/2021)   PRAPARE - Hydrologist (Medical): No    Lack of Transportation (Non-Medical): No  Physical Activity: Unknown (09/03/2021)   Exercise Vital Sign    Days of Exercise per Week: 0 days    Minutes of Exercise per Session: Not on file  Stress: Stress Concern Present (09/03/2021)   [place]    Feeling of Stress : To some extent  Social Connections: Moderately Integrated (09/03/2021)   Social Connection and Isolation Panel [NHANES]    Frequency of Communication with Friends and Family: More than three times a week    Frequency of Social Gatherings with Friends and Family: Once a week    Attends Religious Services: More  than 4 times per year    Active Member of Clubs or Organizations: Yes    Attends Archivist Meetings: More than 4 times per year    Marital Status: Divorced   Vitals:   05/12/22 1235  BP: 122/70  Pulse: 74  Resp: 12  SpO2: 97%  Body mass index is 29.95 kg/m.  Physical Exam Vitals and nursing note reviewed.  Constitutional:      General: She is not in acute distress.    Appearance: She is well-developed.  HENT:     Head: Normocephalic and atraumatic.     Nose: Rhinorrhea present.     Mouth/Throat:     Mouth: Mucous membranes are moist.     Pharynx: Oropharynx is clear.  Eyes:     Conjunctiva/sclera: Conjunctivae normal.  Cardiovascular:     Rate and Rhythm: Normal rate and regular rhythm.     Pulses:          Dorsalis pedis pulses are 2+ on the right side and 2+ on the left side.     Heart sounds: No murmur heard. Pulmonary:     Effort: Pulmonary effort is normal. No respiratory distress.     Breath sounds: Normal breath sounds.  Abdominal:     Palpations: Abdomen is soft. There is no hepatomegaly or mass.     Tenderness: There is no abdominal tenderness.  Lymphadenopathy:     Cervical: No cervical  adenopathy.  Skin:    General: Skin is warm.     Findings: No erythema or rash.  Neurological:     General: No focal deficit present.     Mental Status: She is alert and oriented to person, place, and time.     Cranial Nerves: No cranial nerve deficit.     Gait: Gait normal.  Psychiatric:        Mood and Affect: Mood is not anxious or depressed.   ASSESSMENT AND PLAN:  Jo [name] was seen today for establish care.  Diagnoses and all orders for this visit:  SVT (supraventricular tachycardia) ([place]) She has not had an episode in a few months. Following with cardiologist.  Seasonal allergies Problem is otherwise stable. Continue Astelin nasal spray twice daily and Flonase nasal spray daily as needed. Recommend nasal saline irrigations as needed.  Menopausal vasomotor syndrome Problem has been well controlled with estradiol and progesterone.  Symptoms have reoccurred every time she tries to wean off medications. We discussed some side effects of medications, which she has also reviewed with gynecologist in 10/2021.  According to notes, it was recommended to start decreasing dose gradually to 0.5 mg daily.We could try decreasing to 1.5 mg and 1 mg as tolerated. Last mammogram on 12/01/2021.  Gastro-esophageal reflux disease without esophagitis Problem stable. Continue Prevacid 30 mg twice daily and GERD precautions. Sucralfate, Zofran, promethazine, and metoclopramide are being prescribed by gastroenterologist. Continue following with gastroenterologist.  Hypothyroidism Last TSH in 10/2021 normal at 2.5. Continue levothyroxine 125 mcg daily. Follow-up in 10/2022.  Depression, recurrent ([place]) Problem is stable. Continue sertraline 100 mg 2 tablets daily and Lamictal 100 mg twice daily. Follow-up in 6 months, before if needed.  Anxiety disorder Problem is stable. Continue sertraline 100 mg 2 tablets daily and BuSpar 15 mg 1/2 tablet twice daily. Follow-up in 6 months, before if  needed.  Complex regional pain syndrome I, unspecified Problem is otherwise stable. She follows with pain management and rheumatologist.  Chronic headaches She has been evaluated by neurologist in 09/2015. Brain  MRI negative. According to notes, headache was not suggestive of migraines. Could be tension headaches as well as medication related. Problem has been stable.  I spent a total of 61 minutes in both face to face and non face to face activities for this visit on the date of this encounter. During this time history was obtained and documented, examination was performed, prior labs/imaging/records reviewed, and assessment/plan discussed (reviewed medications and indications as well as some side effects).  Return in about 6 months (around 11/12/2022).  Mickayla Trouten G. [name], MD  [facility]. [place] office.

## 2022-05-12 ENCOUNTER — Ambulatory Visit (INDEPENDENT_AMBULATORY_CARE_PROVIDER_SITE_OTHER): Payer: PPO | Admitting: Family Medicine

## 2022-05-12 ENCOUNTER — Ambulatory Visit: Payer: PPO | Admitting: Physical Therapy

## 2022-05-12 ENCOUNTER — Encounter: Payer: Self-pay | Admitting: Physical Therapy

## 2022-05-12 ENCOUNTER — Encounter: Payer: Self-pay | Admitting: Family Medicine

## 2022-05-12 VITALS — BP 122/70 | HR 74 | Resp 12 | Ht 64.0 in | Wt 174.5 lb

## 2022-05-12 DIAGNOSIS — G9059 Complex regional pain syndrome I of other specified site: Secondary | ICD-10-CM | POA: Diagnosis not present

## 2022-05-12 DIAGNOSIS — F339 Major depressive disorder, recurrent, unspecified: Secondary | ICD-10-CM

## 2022-05-12 DIAGNOSIS — I471 Supraventricular tachycardia: Secondary | ICD-10-CM

## 2022-05-12 DIAGNOSIS — N951 Menopausal and female climacteric states: Secondary | ICD-10-CM

## 2022-05-12 DIAGNOSIS — R519 Headache, unspecified: Secondary | ICD-10-CM

## 2022-05-12 DIAGNOSIS — M6281 Muscle weakness (generalized): Secondary | ICD-10-CM | POA: Diagnosis not present

## 2022-05-12 DIAGNOSIS — G8929 Other chronic pain: Secondary | ICD-10-CM | POA: Diagnosis not present

## 2022-05-12 DIAGNOSIS — F419 Anxiety disorder, unspecified: Secondary | ICD-10-CM

## 2022-05-12 DIAGNOSIS — J302 Other seasonal allergic rhinitis: Secondary | ICD-10-CM

## 2022-05-12 DIAGNOSIS — E039 Hypothyroidism, unspecified: Secondary | ICD-10-CM | POA: Diagnosis not present

## 2022-05-12 DIAGNOSIS — K219 Gastro-esophageal reflux disease without esophagitis: Secondary | ICD-10-CM

## 2022-05-12 DIAGNOSIS — R2689 Other abnormalities of gait and mobility: Secondary | ICD-10-CM

## 2022-05-12 MED ORDER — ESTRADIOL 2 MG PO TABS: 2.0000 mg | ORAL_TABLET | Freq: Every day | ORAL | 2 refills | Status: DC

## 2022-05-12 NOTE — Assessment & Plan Note (Addendum)
Problem has been well controlled with estradiol and progesterone.  Symptoms have reoccurred every time she tries to wean off medications. We discussed some side effects of medications, which she has also reviewed with gynecologist in 10/2021.  According to notes, it was recommended to start decreasing dose gradually to 0.5 mg daily.We could try decreasing to 1.5 mg and 1 mg as tolerated. Last mammogram on 12/01/2021.

## 2022-05-12 NOTE — Assessment & Plan Note (Signed)
She has not had an episode in a few months. Following with cardiologist.

## 2022-05-12 NOTE — Assessment & Plan Note (Signed)
Problem stable. Continue Prevacid 30 mg twice daily and GERD precautions. Sucralfate, Zofran, promethazine, and metoclopramide are being prescribed by gastroenterologist. Continue following with gastroenterologist.

## 2022-05-12 NOTE — Assessment & Plan Note (Signed)
Problem is stable. Continue sertraline 100 mg 2 tablets daily and BuSpar 15 mg 1/2 tablet twice daily. Follow-up in 6 months, before if needed.

## 2022-05-12 NOTE — Assessment & Plan Note (Signed)
Problem is stable. Continue sertraline 100 mg 2 tablets daily and Lamictal 100 mg twice daily. Follow-up in 6 months, before if needed.

## 2022-05-12 NOTE — Assessment & Plan Note (Signed)
She has been evaluated by neurologist in 09/2015. Brain MRI negative. According to notes, headache was not suggestive of migraines. Could be tension headaches as well as medication related. Problem has been stable.
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
## 2022-05-12 NOTE — Assessment & Plan Note (Addendum)
Problem is otherwise stable. She follows with pain management and rheumatologist.

## 2022-05-12 NOTE — Assessment & Plan Note (Addendum)
Last TSH in 10/2021 normal at 2.5. Continue levothyroxine 125 mcg daily. Follow-up in 10/2022.

## 2022-05-12 NOTE — Patient Instructions (Addendum)
A few things to remember from today's visit:  Menopausal symptoms - Plan: estradiol (ESTRACE) 2 MG tablet  SVT (supraventricular tachycardia) (HCC)  Depression, recurrent ([place]), Chronic  Hypothyroidism, unspecified type  Seasonal allergies  If you need refills please call your pharmacy. Do not use My Chart to request refills or for acute issues that need immediate attention.   No changes today.  Please be sure medication list is accurate. If a new problem present, please set up appointment sooner than planned today.

## 2022-05-12 NOTE — Assessment & Plan Note (Signed)
Problem is otherwise stable. Continue Astelin nasal spray twice daily and Flonase nasal spray daily as needed. Recommend nasal saline irrigations as needed.

## 2022-05-13 LAB — CUP PACEART REMOTE DEVICE CHECK
Date Time Interrogation Session: 20230820230419
Implantable Pulse Generator Implant Date: 20221006

## 2022-05-14 ENCOUNTER — Ambulatory Visit: Payer: PPO | Admitting: Physical Therapy

## 2022-05-14 ENCOUNTER — Encounter: Payer: Self-pay | Admitting: Physical Therapy

## 2022-05-14 DIAGNOSIS — G8929 Other chronic pain: Secondary | ICD-10-CM

## 2022-05-14 DIAGNOSIS — M6281 Muscle weakness (generalized): Secondary | ICD-10-CM | POA: Diagnosis not present

## 2022-05-14 DIAGNOSIS — R2689 Other abnormalities of gait and mobility: Secondary | ICD-10-CM
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
## 2022-05-19 ENCOUNTER — Encounter: Payer: Self-pay | Admitting: Physical Therapy

## 2022-05-19 ENCOUNTER — Ambulatory Visit: Payer: PPO | Admitting: Physical Therapy

## 2022-05-19 DIAGNOSIS — M6281 Muscle weakness (generalized): Secondary | ICD-10-CM

## 2022-05-19 DIAGNOSIS — G8929 Other chronic pain: Secondary | ICD-10-CM

## 2022-05-19 DIAGNOSIS — R2689 Other abnormalities of gait and mobility: Secondary | ICD-10-CM
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
## 2022-05-21 ENCOUNTER — Encounter: Payer: Self-pay | Admitting: Physical Therapy

## 2022-05-21 ENCOUNTER — Ambulatory Visit: Payer: PPO | Admitting: Physical Therapy

## 2022-05-21 DIAGNOSIS — M6281 Muscle weakness (generalized): Secondary | ICD-10-CM

## 2022-05-21 DIAGNOSIS — G894 Chronic pain syndrome: Secondary | ICD-10-CM | POA: Diagnosis not present

## 2022-05-21 DIAGNOSIS — Z79891 Long term (current) use of opiate analgesic: Secondary | ICD-10-CM | POA: Diagnosis not present

## 2022-05-21 DIAGNOSIS — R2689 Other abnormalities of gait and mobility: Secondary | ICD-10-CM

## 2022-05-21 DIAGNOSIS — G90512 Complex regional pain syndrome I of left upper limb: Secondary | ICD-10-CM | POA: Diagnosis not present

## 2022-05-21 DIAGNOSIS — M542 Cervicalgia: Secondary | ICD-10-CM | POA: Diagnosis not present

## 2022-05-21 DIAGNOSIS — G8929 Other chronic pain: Secondary | ICD-10-CM
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
## 2022-05-26 ENCOUNTER — Encounter: Payer: Self-pay | Admitting: Physical Therapy

## 2022-05-26 ENCOUNTER — Ambulatory Visit: Payer: PPO | Attending: Pediatric Radiology | Admitting: Physical Therapy

## 2022-05-26 DIAGNOSIS — M25512 Pain in left shoulder: Secondary | ICD-10-CM | POA: Diagnosis not present

## 2022-05-26 DIAGNOSIS — M6281 Muscle weakness (generalized): Secondary | ICD-10-CM

## 2022-05-26 DIAGNOSIS — M25562 Pain in left knee: Secondary | ICD-10-CM | POA: Insufficient documentation

## 2022-05-26 DIAGNOSIS — R2689 Other abnormalities of gait and mobility: Secondary | ICD-10-CM | POA: Diagnosis not present

## 2022-05-26 DIAGNOSIS — G8929 Other chronic pain: Secondary | ICD-10-CM | POA: Diagnosis not present
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
## 2022-05-28 ENCOUNTER — Telehealth: Payer: Self-pay | Admitting: Pharmacist

## 2022-05-28 ENCOUNTER — Ambulatory Visit: Payer: PPO | Admitting: Physical Therapy

## 2022-05-28 NOTE — Chronic Care Management (AMB) (Signed)
Chronic Care Management Pharmacy Assistant   Name: Jo [name]  MRN: [id number] DOB: Jun 28, 1961  Reason for Encounter: Medication Coordination & Review   Recent office visits:  05/12/22 [name], [name], MD - Patient presented for Chronic nonintractable headache unspecified headache type and other concerns. No medication changes.  Recent consult visits:  05/26/22 Beuhring, Alene Mires, PT - Patient presented for Muscle weakness generalized and other concerns. No medication changes.  05/06/22 Beuhring, Alene Mires, PT - Patient presented for Muscle weakness generalized and other concerns. No medication changes.  Hospital visits:  None in previous 6 months  Medications: Outpatient Encounter Medications as of 05/28/2022  Medication Sig   azelastine (ASTELIN) 0.1 % nasal spray instill one SPRAY into BOTH nostrils twice daily   B-D 3CC LUER-LOK SYR 23GX1" 23G X 1" 3 ML MISC Inject 2 Needles into the skin See admin instructions.   Belimumab (BENLYSTA) 200 MG/ML SOSY once a week.   betamethasone dipropionate 0.05 % lotion Apply topically as needed.   busPIRone (BUSPAR) 15 MG tablet TAKE 1/2 TABLET BY MOUTH EVERY MORNING and TAKE 1/2 TABLET BY MOUTH EVERYDAY AT BEDTIME   cholecalciferol (VITAMIN D) 1000 UNITS tablet Take 2,000 Units by mouth daily.   clindamycin (CLEOCIN T) 1 % lotion Apply 1 application topically 2 (two) times daily as needed.   Cranberry 200 MG CAPS Take 200 mg by mouth 2 (two) times daily.   cyanocobalamin (,VITAMIN B-12,) 1000 MCG/ML injection Inject 1 mL (1,000 mcg total) into the muscle every 30 (thirty) days.   erythromycin with ethanol (EMGEL) 2 % gel Apply topically 2 (two) times daily. (Patient taking differently: Apply topically 2 (two) times daily as needed.)   estradiol (ESTRACE) 2 MG tablet Take 1 tablet (2 mg total) by mouth daily.   fluticasone (CUTIVATE) 0.05 % cream Apply 1 application topically 2 (two) times daily as needed (rash).    fluticasone (FLONASE) 50  MCG/ACT nasal spray USE TWO SPRAYS IN EACH NOSTRIL DAILY AS DIRECTED   folic acid (FOLVITE) 1 MG tablet Take 1 tablet (1 mg total) by mouth daily. (Patient taking differently: Take 1 mg by mouth in the morning and at bedtime.)   HYDROmorphone (DILAUDID) 4 MG tablet Take by mouth as needed for severe pain.   ketorolac (TORADOL) 30 MG/ML injection Inject 1 mL (30 mg total) into the muscle daily as needed for moderate pain.   lamoTRIgine (LAMICTAL) 100 MG tablet TAKE ONE TABLET BY MOUTH AT BREAKFAST AND AT BEDTIME   lansoprazole (PREVACID) 30 MG capsule Take 1 capsule (30 mg total) by mouth 2 (two) times daily before a meal.   levocetirizine (XYZAL) 5 MG tablet Take 5 mg by mouth daily.   levothyroxine (SYNTHROID) 125 MCG tablet Take 1 tablet (125 mcg total) by mouth every morning.   LYSINE PO Take 1 tablet by mouth daily.   medroxyPROGESTERone (PROVERA) 2.5 MG tablet Take 1 tablet PO daily   methadone (DOLOPHINE) 5 MG tablet Take 1 tablet by mouth 2 (two) times daily.   metoCLOPramide (REGLAN) 10 MG tablet Take 1 tablet (10 mg total) by mouth every 6 (six) hours as needed for nausea.   ondansetron (ZOFRAN) 8 MG tablet Take 1 tablet (8 mg total) by mouth every 8 (eight) hours as needed for nausea or vomiting.   promethazine (PHENERGAN) 25 MG tablet TAKE ONE TABLET BY MOUTH FOR 8 HOURS AS NEEDED   sertraline (ZOLOFT) 100 MG tablet Take 2 tablets (200 mg total) by mouth every  morning.   sucralfate (CARAFATE) 1 g tablet Take 1 tablet (1 g total) by mouth 3 (three) times daily as needed for up to 14 days. (Patient taking differently: Take 1 g by mouth as needed.)   tiZANidine (ZANAFLEX) 4 MG capsule Take 3 capsules (12 mg total) by mouth at bedtime as needed for muscle spasms.   topiramate (TOPAMAX) 25 MG tablet Take by mouth.   triamcinolone cream (KENALOG) 0.1 % daily as needed.   No facility-administered encounter medications on file as of 05/28/2022.   Reviewed chart for medication changes ahead of  medication coordination call.  BP Readings from Last 3 Encounters:  05/12/22 122/70  02/04/22 112/72  11/11/21 120/78    Lab Results  Component Value Date   HGBA1C 5.6 11/15/2019     Patient obtains medications through Adherence Packaging  90 Days   Last adherence delivery included:  Buspirone (Buspar) 15 mg : Take half tab at Breakfast and half at bedtime ( will be run on 6/14 for price pharm to call pt to see if she still wants this included as per pt price has increased) Lansoprazole ( Prevacid) 30 mg: Take one capsule breakfast and bedtime  Levothyroxine (Synthroid) 125 mg : Take one tab at Breakfast Sertraline (Zoloft) 100 mg : Take two tabs with Breakfast Lamotrigine (Lamictal) 100 mg: Take one tab at Breakfast and one at Bedtime Levocetirizine 5 mg: Take one tab at breakfast Hypo Needle 25 g Estradiol (Estrace) 2 mg : Take one tab at Breakfast Tizanidine (Zanaflex) 4 mg : Take 3 tabs at bedtime Medroxyprogesterone (Provera) 2.5 mg : Take one tab daily at breakfast  Vitamin B-12 injection : Inject 1 ml once a month Toprimate (Topomax) 25 RU:EAVW one tab at Breakfast and 4 at Bedtime Folic Acid 1 mg: Take one tab at breakfast and one at bedtime      Patient declined the following medications due to abundance Azelastine Spray  Fluticasone Nasal Spray      Confirmed delivery date of 03/11/22, advised patient that pharmacy will contact them the morning of delivery.      Notes: Patient inquired about additional needles (25g) for her B-12 as she uses one to draw up the medication and It then is too dull for her to administer has. Forwarded to Pharmacist to see if that can be remedied with the pharmacy. Patient also noted that she was mailed a notice that her genesight had to be done by a certain date and it has passed as she wanted to check with ins for coverage 1st. She wanted to know if the order expires, per MP it is still showing as active and she can let us know when she  has done it so that MP may log in for results in absence of PCP. Patient aware and has contact number for company.   Per Pharmacy tier exception pt requested for Buspar has been denied cost of medication for  90 DS with is 119.00 or cash price of 44.26 call to patient to advise and see how she would like to proceed. Did not reach her left voicemail with my contact information for return call.   Patient returned call reports she would like to proceed with cash price of 44.25 for medication also would like pricing information for L-Lysine she is taking 1000 mg daily. Per Pharmacy 90 DS would be 6.29. Patient in agreement and would like it added to her packs. Pharmacy advised.   Patient is due for next adherence  delivery on: 06/09/22. Called patient and reviewed medications and coordinated delivery. Packs 90 DS   This delivery to include: Buspirone (Buspar) 15 mg : Take half tab at Breakfast and half at bedtime (7.5 mg) Lansoprazole ( Prevacid) 30 mg: Take one capsule breakfast and bedtime  Levothyroxine (Synthroid) 125 mg : Take one tab at Breakfast Sertraline (Zoloft) 100 mg : Take two tabs with Breakfast Lamotrigine (Lamictal) 100 mg: Take one tab at Breakfast and one at Bedtime Levocetirizine 5 mg: Take one tab at bedtime BD Syringes needles 3ML (25g1") : 2 syringes per month for b 12 Estradiol (Estrace) 2 mg : Take one tab at Breakfast Tizanidine (Zanaflex) 4 mg : Take 3 tabs at bedtime Medroxyprogesterone (Provera) 2.5 mg : Take one tab daily at breakfast Vitamin B-12 injection : Inject 1 ml once a month Toprimate (Topomax) 25 QZ:RAQT one tab at Breakfast and 4 at Bedtime Folic Acid 1 mg: Take one tab at breakfast and one at bedtime (requested prescription be sent) L- Lysine 1000 mg: Take one tab at breakfast  Patient declined the following medications due to abundance Azelastine Spray  Fluticasone Nasal Spray     Confirmed delivery date of 06/09/22, advised patient that pharmacy  will contact them the morning of delivery.    Care Gaps: Eye exam - Overdue TDAP - Overdue COVID Booster - Overdue Flu Vaccine - Overdue HIV Screen- Postponed CCM- 1/24 AWV- 1/22   Star Rating Drugs: None    Ned Clines [place] Clinical Pharmacist Assistant [phone]

## 2022-06-02 ENCOUNTER — Telehealth: Payer: Self-pay | Admitting: Family Medicine

## 2022-06-02 ENCOUNTER — Ambulatory Visit: Payer: PPO | Admitting: Physical Therapy

## 2022-06-02 ENCOUNTER — Other Ambulatory Visit: Payer: Self-pay

## 2022-06-02 DIAGNOSIS — N951 Menopausal and female climacteric states: Secondary | ICD-10-CM

## 2022-06-02 MED ORDER — MEDROXYPROGESTERONE ACETATE 2.5 MG PO TABS: ORAL_TABLET | ORAL | 1 refills | Status: DC

## 2022-06-02 NOTE — Telephone Encounter (Signed)
Left message for patient to call back and schedule Medicare Annual Wellness Visit (AWV) either virtually or in office. Left  my Jo [name] number [phone]   Last AWV ;10/09/20  please schedule at anytime with [facility] Nurse Health Advisor 1 or 2

## 2022-06-02 NOTE — Telephone Encounter (Signed)
-----   Message from Viona Gilmore, [facility] sent at 06/02/2022  1:09 PM EDT ----- Regarding: Refill Hi,  Dr. Ethlyn Gallery was previously prescribing medroxyprogesterone and she is now almost out of the medication. Can you send a refill to [facility] for a 90 ds?  Thanks! Maddie

## 2022-06-04 ENCOUNTER — Encounter: Payer: PPO | Admitting: Physical Therapy

## 2022-06-07 NOTE — Progress Notes (Signed)
Carelink Summary Report / Loop Recorder 

## 2022-06-09 ENCOUNTER — Ambulatory Visit: Payer: PPO | Admitting: Physical Therapy

## 2022-06-11 ENCOUNTER — Ambulatory Visit: Payer: PPO | Admitting: Physical Therapy

## 2022-06-15 ENCOUNTER — Ambulatory Visit (INDEPENDENT_AMBULATORY_CARE_PROVIDER_SITE_OTHER): Payer: PPO

## 2022-06-15 DIAGNOSIS — R55 Syncope and collapse: Secondary | ICD-10-CM

## 2022-06-15 LAB — CUP PACEART REMOTE DEVICE CHECK
Date Time Interrogation Session: 20230922230441
Implantable Pulse Generator Implant Date: 20221006

## 2022-06-16 ENCOUNTER — Ambulatory Visit: Payer: PPO | Admitting: Physical Therapy

## 2022-06-17 ENCOUNTER — Encounter: Payer: Self-pay | Admitting: Cardiology

## 2022-06-18 ENCOUNTER — Ambulatory Visit: Payer: PPO | Admitting: Physical Therapy

## 2022-06-18 ENCOUNTER — Telehealth: Payer: Self-pay | Admitting: Family Medicine

## 2022-06-18 NOTE — Telephone Encounter (Signed)
Spoke with patient to schedule Medicare Annual Wellness Visit (AWV) either virtually or in office.   Patient stated she has so many other dr appointments.  She stated she is not going to schedule AWV     Last AWV  10/09/20 please schedule with Nurse Health Adviser   45 min for awv-i and in office appointments 30 min for awv-s  phone/virtual appointments

## 2022-06-22 DIAGNOSIS — M542 Cervicalgia: Secondary | ICD-10-CM | POA: Diagnosis not present

## 2022-06-22 DIAGNOSIS — G894 Chronic pain syndrome: Secondary | ICD-10-CM | POA: Diagnosis not present

## 2022-06-22 DIAGNOSIS — Z79891 Long term (current) use of opiate analgesic: Secondary | ICD-10-CM | POA: Diagnosis not present

## 2022-06-22 DIAGNOSIS — G90512 Complex regional pain syndrome I of left upper limb: Secondary | ICD-10-CM | POA: Diagnosis not present

## 2022-06-25 NOTE — Progress Notes (Signed)
Carelink Summary Report / Loop Recorder 

## 2022-07-09 DIAGNOSIS — M797 Fibromyalgia: Secondary | ICD-10-CM | POA: Diagnosis not present

## 2022-07-09 DIAGNOSIS — G8929 Other chronic pain: Secondary | ICD-10-CM | POA: Diagnosis not present

## 2022-07-09 DIAGNOSIS — R768 Other specified abnormal immunological findings in serum: Secondary | ICD-10-CM | POA: Diagnosis not present

## 2022-07-09 DIAGNOSIS — M1990 Unspecified osteoarthritis, unspecified site: Secondary | ICD-10-CM | POA: Diagnosis not present

## 2022-07-09 DIAGNOSIS — G9050 Complex regional pain syndrome I, unspecified: Secondary | ICD-10-CM | POA: Diagnosis not present

## 2022-07-09 DIAGNOSIS — M79652 Pain in left thigh: Secondary | ICD-10-CM | POA: Diagnosis not present

## 2022-07-09 DIAGNOSIS — Z23 Encounter for immunization: Secondary | ICD-10-CM | POA: Diagnosis not present

## 2022-07-09 DIAGNOSIS — R5383 Other fatigue: Secondary | ICD-10-CM | POA: Diagnosis not present

## 2022-07-09 DIAGNOSIS — K219 Gastro-esophageal reflux disease without esophagitis: Secondary | ICD-10-CM | POA: Diagnosis not present

## 2022-07-09 DIAGNOSIS — M7062 Trochanteric bursitis, left hip: Secondary | ICD-10-CM | POA: Diagnosis not present

## 2022-07-09 DIAGNOSIS — I7300 Raynaud's syndrome without gangrene: Secondary | ICD-10-CM | POA: Diagnosis not present

## 2022-07-09 DIAGNOSIS — M329 Systemic lupus erythematosus, unspecified: Secondary | ICD-10-CM | POA: Diagnosis not present

## 2022-07-15 ENCOUNTER — Ambulatory Visit (INDEPENDENT_AMBULATORY_CARE_PROVIDER_SITE_OTHER): Payer: PPO

## 2022-07-15 DIAGNOSIS — R55 Syncope and collapse: Secondary | ICD-10-CM | POA: Diagnosis not present

## 2022-07-16 LAB — CUP PACEART REMOTE DEVICE CHECK
Date Time Interrogation Session: 20231025230428
Implantable Pulse Generator Implant Date: 20221006

## 2022-07-22 DIAGNOSIS — G90512 Complex regional pain syndrome I of left upper limb: Secondary | ICD-10-CM | POA: Diagnosis not present

## 2022-07-22 DIAGNOSIS — M542 Cervicalgia: Secondary | ICD-10-CM | POA: Diagnosis not present

## 2022-07-22 DIAGNOSIS — G894 Chronic pain syndrome: Secondary | ICD-10-CM | POA: Diagnosis not present

## 2022-07-22 DIAGNOSIS — Z79891 Long term (current) use of opiate analgesic: Secondary | ICD-10-CM | POA: Diagnosis not present

## 2022-07-28 NOTE — Progress Notes (Signed)
Carelink Summary Report / Loop Recorder 

## 2022-08-17 ENCOUNTER — Ambulatory Visit (INDEPENDENT_AMBULATORY_CARE_PROVIDER_SITE_OTHER): Payer: PPO

## 2022-08-17 DIAGNOSIS — R55 Syncope and collapse: Secondary | ICD-10-CM

## 2022-08-17 LAB — CUP PACEART REMOTE DEVICE CHECK
Date Time Interrogation Session: 20231126231104
Implantable Pulse Generator Implant Date: 20221006

## 2022-08-21 DIAGNOSIS — M542 Cervicalgia: Secondary | ICD-10-CM | POA: Diagnosis not present

## 2022-08-21 DIAGNOSIS — G894 Chronic pain syndrome: Secondary | ICD-10-CM | POA: Diagnosis not present

## 2022-08-21 DIAGNOSIS — Z79891 Long term (current) use of opiate analgesic: Secondary | ICD-10-CM | POA: Diagnosis not present

## 2022-08-21 DIAGNOSIS — G90512 Complex regional pain syndrome I of left upper limb: Secondary | ICD-10-CM | POA: Diagnosis not present

## 2022-08-25 ENCOUNTER — Other Ambulatory Visit: Payer: Self-pay | Admitting: Family

## 2022-08-25 ENCOUNTER — Telehealth: Payer: Self-pay | Admitting: Pharmacist

## 2022-08-25 ENCOUNTER — Other Ambulatory Visit: Payer: Self-pay

## 2022-08-25 DIAGNOSIS — F341 Dysthymic disorder: Secondary | ICD-10-CM

## 2022-08-25 DIAGNOSIS — E039 Hypothyroidism, unspecified: Secondary | ICD-10-CM

## 2022-08-25 MED ORDER — LAMOTRIGINE 100 MG PO TABS: ORAL_TABLET | ORAL | 1 refills | Status: DC

## 2022-08-25 MED ORDER — LEVOTHYROXINE SODIUM 125 MCG PO TABS: 125.0000 ug | ORAL_TABLET | Freq: Every morning | ORAL | 1 refills | Status: DC

## 2022-08-25 MED ORDER — BUSPIRONE HCL 15 MG PO TABS: ORAL_TABLET | ORAL | 1 refills | Status: DC

## 2022-08-25 NOTE — Progress Notes (Signed)
Chronic Care Management Pharmacy Assistant   Name: Jo [name]  MRN: [id number] DOB: 03-Jul-1961  Reason for Encounter: Medication Review Medication Coordination   Recent office visits:  None   Recent consult visits:  07/15/22 Simone Curia, RN - Clinical encounter for Syncope and Collapse. No medication changes.   Hospital visits:  None in previous 6 months  Medications: Outpatient Encounter Medications as of 08/25/2022  Medication Sig   azelastine (ASTELIN) 0.1 % nasal spray instill one SPRAY into BOTH nostrils twice daily   B-D 3CC LUER-LOK SYR 23GX1" 23G X 1" 3 ML MISC Inject 2 Needles into the skin See admin instructions.   Belimumab (BENLYSTA) 200 MG/ML SOSY once a week.   betamethasone dipropionate 0.05 % lotion Apply topically as needed.   busPIRone (BUSPAR) 15 MG tablet TAKE 1/2 TABLET BY MOUTH EVERY MORNING and TAKE 1/2 TABLET BY MOUTH EVERYDAY AT BEDTIME   cholecalciferol (VITAMIN D) 1000 UNITS tablet Take 2,000 Units by mouth daily.   clindamycin (CLEOCIN T) 1 % lotion Apply 1 application topically 2 (two) times daily as needed.   Cranberry 200 MG CAPS Take 200 mg by mouth 2 (two) times daily.   cyanocobalamin (,VITAMIN B-12,) 1000 MCG/ML injection Inject 1 mL (1,000 mcg total) into the muscle every 30 (thirty) days.   erythromycin with ethanol (EMGEL) 2 % gel Apply topically 2 (two) times daily. (Patient taking differently: Apply topically 2 (two) times daily as needed.)   estradiol (ESTRACE) 2 MG tablet Take 1 tablet (2 mg total) by mouth daily.   fluticasone (CUTIVATE) 0.05 % cream Apply 1 application topically 2 (two) times daily as needed (rash).    fluticasone (FLONASE) 50 MCG/ACT nasal spray USE TWO SPRAYS IN EACH NOSTRIL DAILY AS DIRECTED   folic acid (FOLVITE) 1 MG tablet Take 1 tablet (1 mg total) by mouth daily. (Patient taking differently: Take 1 mg by mouth in the morning and at bedtime.)   HYDROmorphone (DILAUDID) 4 MG tablet Take by mouth as  needed for severe pain.   ketorolac (TORADOL) 30 MG/ML injection Inject 1 mL (30 mg total) into the muscle daily as needed for moderate pain.   lamoTRIgine (LAMICTAL) 100 MG tablet TAKE ONE TABLET BY MOUTH AT BREAKFAST AND AT BEDTIME   lansoprazole (PREVACID) 30 MG capsule Take 1 capsule (30 mg total) by mouth 2 (two) times daily before a meal.   levocetirizine (XYZAL) 5 MG tablet Take 5 mg by mouth daily.   levothyroxine (SYNTHROID) 125 MCG tablet Take 1 tablet (125 mcg total) by mouth every morning.   LYSINE PO Take 1 tablet by mouth daily.   medroxyPROGESTERone (PROVERA) 2.5 MG tablet Take 1 tablet PO daily   methadone (DOLOPHINE) 5 MG tablet Take 1 tablet by mouth 2 (two) times daily.   metoCLOPramide (REGLAN) 10 MG tablet Take 1 tablet (10 mg total) by mouth every 6 (six) hours as needed for nausea.   ondansetron (ZOFRAN) 8 MG tablet Take 1 tablet (8 mg total) by mouth every 8 (eight) hours as needed for nausea or vomiting.   promethazine (PHENERGAN) 25 MG tablet TAKE ONE TABLET BY MOUTH FOR 8 HOURS AS NEEDED   sertraline (ZOLOFT) 100 MG tablet Take 2 tablets (200 mg total) by mouth every morning.   sucralfate (CARAFATE) 1 g tablet Take 1 tablet (1 g total) by mouth 3 (three) times daily as needed for up to 14 days. (Patient taking differently: Take 1 g by mouth as needed.)   tiZANidine (  ZANAFLEX) 4 MG capsule Take 3 capsules (12 mg total) by mouth at bedtime as needed for muscle spasms.   topiramate (TOPAMAX) 25 MG tablet Take by mouth.   triamcinolone cream (KENALOG) 0.1 % daily as needed.   No facility-administered encounter medications on file as of 08/25/2022.  Reviewed chart for medication changes ahead of medication coordination call.  BP Readings from Last 3 Encounters:  05/12/22 122/70  02/04/22 112/72  11/11/21 120/78    Lab Results  Component Value Date   HGBA1C 5.6 11/15/2019     Patient obtains medications through Adherence Packaging  90 Days   Last adherence  delivery included:  Buspirone (Buspar) 15 mg : Take half tab at Breakfast and half at bedtime (7.5 mg) Lansoprazole ( Prevacid) 30 mg: Take one capsule breakfast and bedtime  Levothyroxine (Synthroid) 125 mg : Take one tab at Breakfast Sertraline (Zoloft) 100 mg : Take two tabs with Breakfast Lamotrigine (Lamictal) 100 mg: Take one tab at Breakfast and one at Bedtime Levocetirizine 5 mg: Take one tab at bedtime BD Syringes needles 3ML (25g1") : 2 syringes per month for b 12 Estradiol (Estrace) 2 mg : Take one tab at Breakfast Tizanidine (Zanaflex) 4 mg : Take 3 tabs at bedtime Medroxyprogesterone (Provera) 2.5 mg : Take one tab daily at breakfast Vitamin B-12 injection : Inject 1 ml once a month Toprimate (Topomax) 25 FE:OFHQ one tab at Breakfast and 4 at Bedtime Folic Acid 1 mg: Take one tab at breakfast and one at bedtime (requested prescription be sent) L- Lysine 1000 mg: Take one tab at breakfast   Patient declined the following medications due to abundance Azelastine Spray  Fluticasone Nasal Spray      Confirmed delivery date of 06/09/22, advised patient that pharmacy will contact them the morning of delivery.  Patient is due for next adherence delivery on: 09/07/22 Packs 90 DS  Called patient and reviewed medications and coordinated delivery.  This delivery to include: Buspirone (Buspar) 15 mg : Take half tab at Breakfast and half at bedtime (7.5 mg) Lansoprazole ( Prevacid) 30 mg: Take one capsule breakfast and bedtime  Levothyroxine (Synthroid) 125 mg : Take one tab at Breakfast Sertraline (Zoloft) 100 mg : Take two tabs with Breakfast Lamotrigine (Lamictal) 100 mg: Take one tab at Breakfast and one at Bedtime Levocetirizine 5 mg: Take one tab at bedtime BD Syringes needles 3ML (25g1") : 2 syringes per month for b 12 Estradiol (Estrace) 2 mg : Take one tab at Breakfast Tizanidine (Zanaflex) 4 mg : Take 3 tabs at bedtime Medroxyprogesterone (Provera) 2.5 mg : Take one tab  daily at breakfast Vitamin B-12 injection : Inject 1 ml once a month Toprimate (Topomax) 25 RF:XJOI one tab at Breakfast and 4 at Bedtime Folic Acid 1 mg: Take one tab at breakfast and one at bedtime (requested prescription be sent) L- Lysine 1000 mg: Take one tab at breakfast   hydromorphone 4 mg tablet 08/21/2022 20   methadone 5 mg tablet 08/21/2022 30 120   oxycodone 5 mg tablet 06/22/2022 20   Notes: Pt might be wanting to add cranberry and vit-d to her packaging next delivery once she is though with her supply.   Confirmed delivery date of 09/07/22, advised patient that pharmacy will contact them the morning of delivery.   Care Gaps: Eye Exam - Overdue TDAP - Overdue AWV- MSG sent to [name] CMA to schedule. Flu Vaccine - Overdue COVID Booster - Overdue HIV Screen - Overdue CCM- 10/07/22  BP- 122/70 05/12/22   Star Rating Drugs: None   Ned Clines CMA Clinical Pharmacist Assistant [phone]

## 2022-09-01 ENCOUNTER — Other Ambulatory Visit: Payer: Self-pay | Admitting: Family Medicine

## 2022-09-01 ENCOUNTER — Telehealth: Payer: Self-pay

## 2022-09-01 DIAGNOSIS — F341 Dysthymic disorder: Secondary | ICD-10-CM

## 2022-09-01 MED ORDER — SERTRALINE HCL 100 MG PO TABS: 200.0000 mg | ORAL_TABLET | Freq: Every morning | ORAL | 1 refills | Status: DC

## 2022-09-01 MED ORDER — CYANOCOBALAMIN 1000 MCG/ML IJ SOLN: 1000.0000 ug | INTRAMUSCULAR | 1 refills | Status: DC

## 2022-09-01 NOTE — Telephone Encounter (Signed)
-----   Message from Viona Gilmore, [facility] sent at 09/01/2022  2:47 PM EST ----- Regarding: Refills Hi,  Dr. Ethlyn Gallery was previously prescribing the sertraline, can you send a refill of it to [facility]? There are no more refills left on it now. Also, the vitamin B12 looks like it was sent to Dutch Quint, NP and I don't think she will respond to that one. Can you also send a refill of that to [facility]?  Thanks! Maddie

## 2022-09-22 ENCOUNTER — Ambulatory Visit (INDEPENDENT_AMBULATORY_CARE_PROVIDER_SITE_OTHER): Payer: PPO

## 2022-09-22 DIAGNOSIS — R55 Syncope and collapse: Secondary | ICD-10-CM

## 2022-09-22 LAB — CUP PACEART REMOTE DEVICE CHECK
Date Time Interrogation Session: 20240101230611
Implantable Pulse Generator Implant Date: 20221006

## 2022-09-23 DIAGNOSIS — G90512 Complex regional pain syndrome I of left upper limb: Secondary | ICD-10-CM | POA: Diagnosis not present

## 2022-09-23 DIAGNOSIS — G894 Chronic pain syndrome: Secondary | ICD-10-CM | POA: Diagnosis not present

## 2022-09-23 DIAGNOSIS — M542 Cervicalgia: Secondary | ICD-10-CM | POA: Diagnosis not present

## 2022-09-23 DIAGNOSIS — Z79891 Long term (current) use of opiate analgesic: Secondary | ICD-10-CM | POA: Diagnosis not present

## 2022-09-24 DIAGNOSIS — I7300 Raynaud's syndrome without gangrene: Secondary | ICD-10-CM | POA: Diagnosis not present

## 2022-09-24 DIAGNOSIS — M797 Fibromyalgia: Secondary | ICD-10-CM | POA: Diagnosis not present

## 2022-09-24 DIAGNOSIS — M329 Systemic lupus erythematosus, unspecified: Secondary | ICD-10-CM | POA: Diagnosis not present

## 2022-09-24 DIAGNOSIS — E039 Hypothyroidism, unspecified: Secondary | ICD-10-CM | POA: Diagnosis not present

## 2022-09-24 DIAGNOSIS — Z79890 Hormone replacement therapy (postmenopausal): Secondary | ICD-10-CM | POA: Diagnosis not present

## 2022-09-28 NOTE — Progress Notes (Signed)
Carelink Summary Report / Loop Recorder 

## 2022-10-07 ENCOUNTER — Telehealth: Payer: PPO

## 2022-10-11 IMAGING — MR MR [PERSON_NAME]*[PERSON_NAME]* W/O CM
5 series · 40 of 40 positions shown · non-contrast
Comparison: None.

CLINICAL DATA: Right hand pain and weakness.  History of lupus.

EXAM:
MRI OF THE LEFT HAND WITHOUT CONTRAST
TECHNIQUE: Multiplanar, multisequence MR imaging of the left hand was
performed. No intravenous contrast was administered.

[Series 7: T1 · coronal · left · 3.0mm · 0.42mm/px · 4 of 15 slices shown (1 of 2)]
[im 1/15]
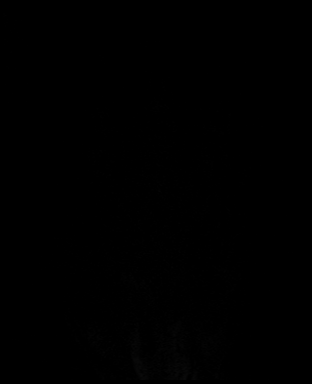
[im 5/15]
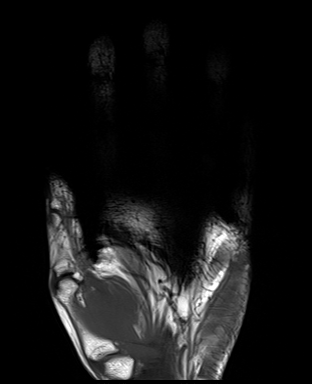
[im 10/15]
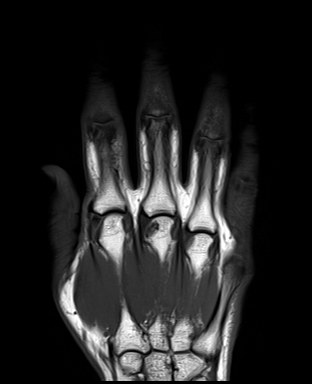
[im 15/15]
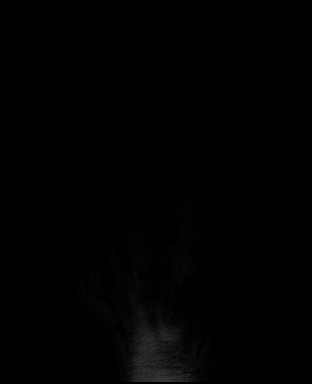

[Series 8: STIR · coronal · left · 3.0mm · 0.42mm/px · 4 of 14 slices shown]
[im 1/14]
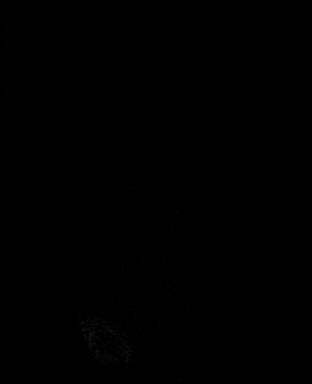
[im 5/14]
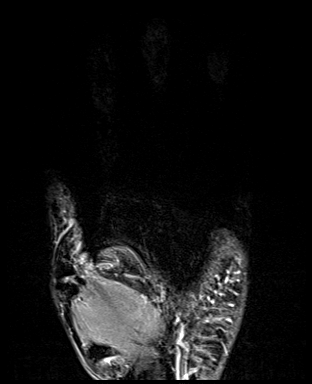
[im 9/14]
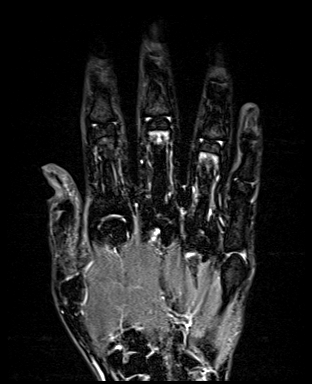
[im 14/14]
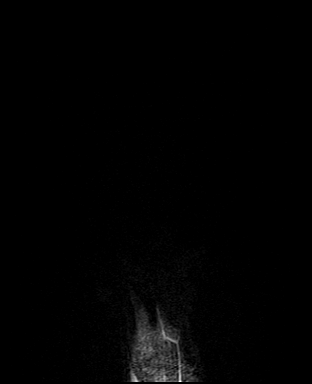

[Series 9: PD fat-sat · sagittal · left · 3.0mm · 0.50mm/px · 8 of 29 slices shown]
[im 1/29]
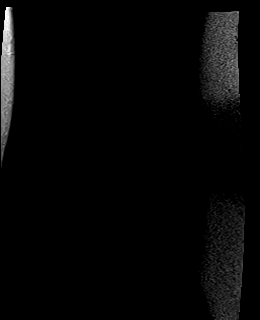
[im 5/29]
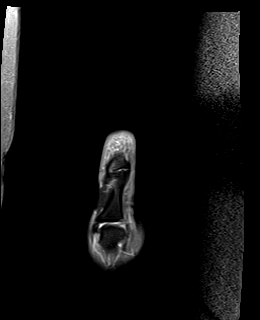
[im 9/29]
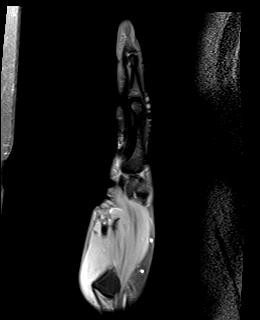
[im 13/29]
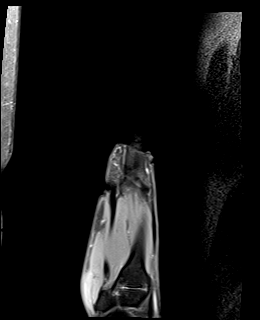
[im 17/29]
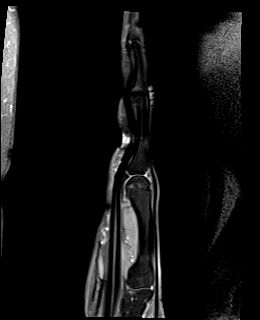
[im 21/29]
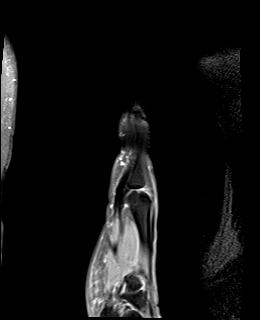
[im 25/29]
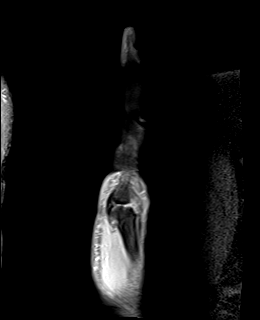
[im 29/29]
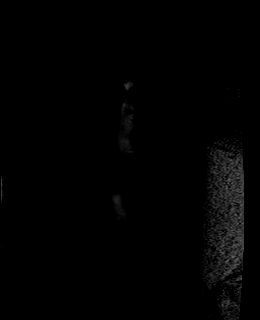

[Series 100: T1 · axial · left · 4.0mm · 0.38mm/px · z∈[-70,+110]mm · 12 of 40 slices shown (2 of 2)]
[im 1/40]
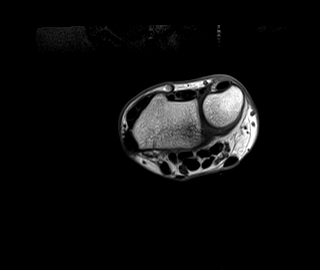
[im 4/40]
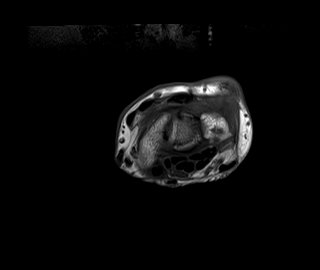
[im 8/40]
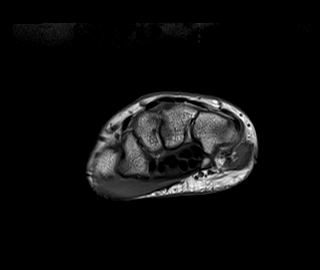
[im 11/40]
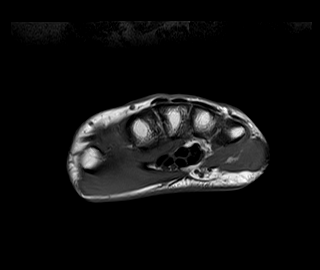
[im 15/40]
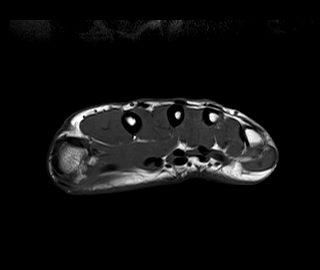
[im 18/40]
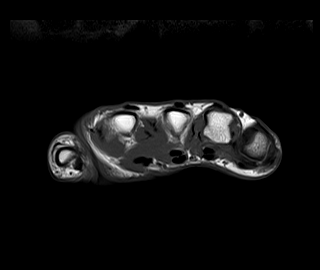
[im 22/40]
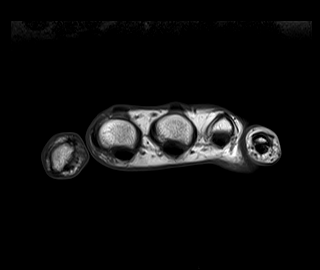
[im 25/40]
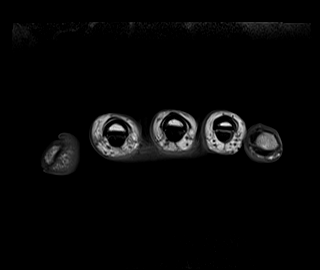
[im 29/40]
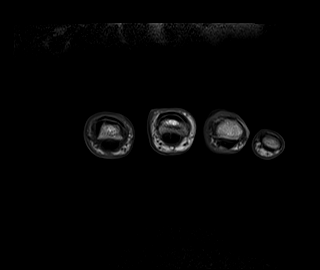
[im 32/40]
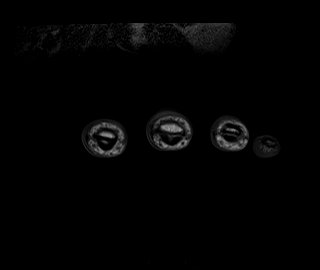
[im 36/40]
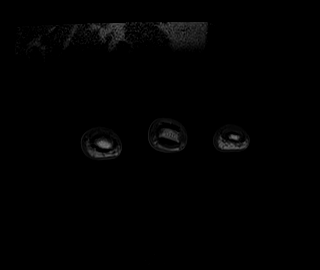
[im 40/40]
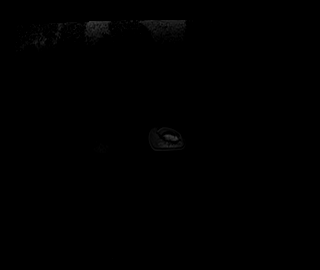

[Series 101: T2 fat-sat · axial · left · 4.0mm · 0.38mm/px · z∈[-69,+110]mm · 12 of 40 slices shown]
[im 1/40]
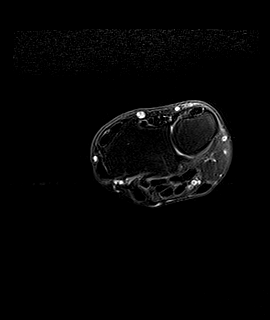
[im 4/40]
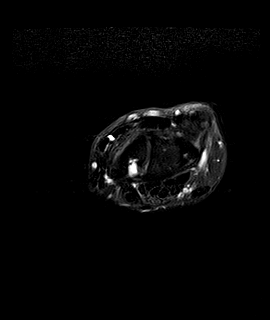
[im 8/40]
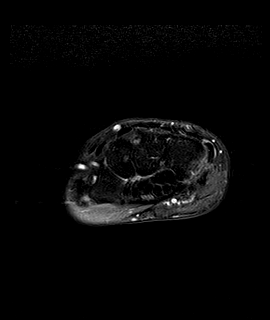
[im 11/40]
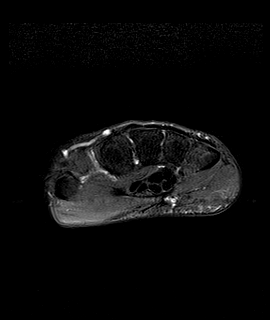
[im 15/40]
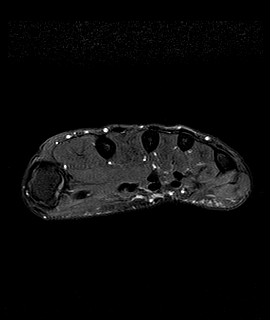
[im 18/40]
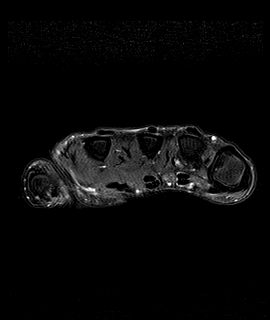
[im 22/40]
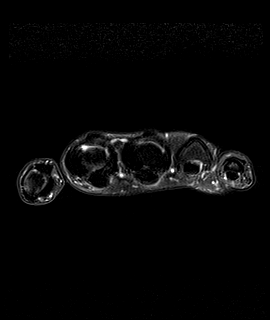
[im 25/40]
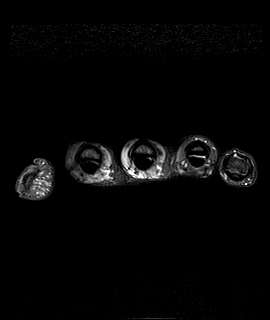
[im 29/40]
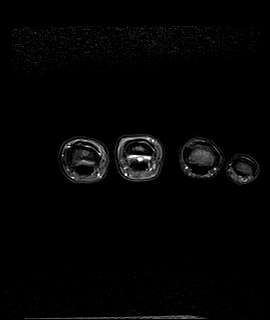
[im 32/40]
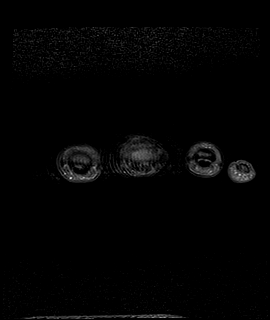
[im 36/40]
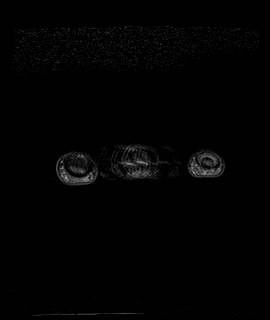
[im 40/40]
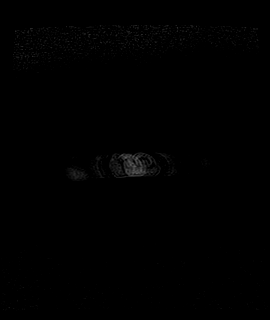

[40 of 40 positions shown; findings below may reference images not displayed]

FINDINGS: The joint spaces are maintained. No joint effusions or evidence of
synovitis. No significant degenerative changes or evidence of
erosive findings. There is a benign-appearing cyst noted in the
third metacarpal head.

The flexor and extensor tendons are intact. No tendinopathy or tear.

The radial and ulnar collateral ligaments are intact.
IMPRESSION: 1. No findings suspicious for an erosive arthropathy.
2. Intact flexor and extensor tendons.
3. Benign-appearing cyst in the third metacarpal head.

## 2022-10-13 ENCOUNTER — Telehealth: Payer: Self-pay | Admitting: Cardiovascular Disease

## 2022-10-13 NOTE — Telephone Encounter (Signed)
No objections

## 2022-10-13 NOTE — Telephone Encounter (Signed)
  Patient would like to switch from Dr Gwenlyn Found to Dr Sallyanne Kuster.

## 2022-10-14 ENCOUNTER — Ambulatory Visit: Payer: PPO | Attending: Cardiology | Admitting: Cardiology

## 2022-10-14 ENCOUNTER — Encounter: Payer: Self-pay | Admitting: Cardiology

## 2022-10-14 VITALS — BP 110/74 | HR 64 | Ht 64.0 in | Wt 175.0 lb

## 2022-10-14 DIAGNOSIS — R55 Syncope and collapse: Secondary | ICD-10-CM

## 2022-10-14 DIAGNOSIS — R002 Palpitations: Secondary | ICD-10-CM

## 2022-10-14 NOTE — Progress Notes (Signed)
Electrophysiology Office Follow up Visit Note:    Date:  10/14/2022   ID:  Jo [name], DOB [birth year], MRN [id number]  PCP:  [name], [name], MD  [place] Cardiologist:  Quay Burow, MD  [facility] Electrophysiologist:  Vickie Epley, MD    Interval History:    Jo [name] is a 62 y.o. female who presents for a follow up visit. They were last seen in clinic June 26, 2021 for SVT and syncope.  She has had multiple episodes of syncope without prodrome.  A loop recorder was implanted which is shown PACs and PVCs but no arrhythmias that would cause syncope.  Today, she states she has been feeling fatigued constantly. She also complains of palpitations that feel like they are "going to jump out of her heart." Sometimes they are severe enough to be painful. Her palpitations are sometimes constant, and sometimes more periodic. However, she believes they are somewhat improved since starting her biologic therapy for her Lupus and RA. She confirms being intolerant of steroidal medications.  She reports that she has occasional events including sudden speech difficulty or suddenly stares out into space and loses time.   She denies any shortness of breath, or peripheral edema. No lightheadedness, headaches, syncope, orthopnea, or PND.     Past Medical History:  Diagnosis Date   Allergy    Anemia    Anxiety    Anxiety and depression    Arrhythmia 2021   Arthritis    Asthma    Cervical cancer [facility])    age 57    Chronic headaches    Colon polyp    COPD (chronic obstructive pulmonary disease) (HCC)    no o2   Depression    Dysrhythmia    Endometriosis    Fibromyalgia    Fundic gland polyps of stomach, benign    Gastritis    GERD (gastroesophageal reflux disease)    H. pylori infection 2013   H/O gastric ulcer 1985   History of anorexia nervosa    History of bulimia    History of pneumonia    Hypothyroidism    Irritable bowel syndrome (IBS)    Obesity     Personal history of other mental and behavioral disorders 01/14/2008   Qualifier: Diagnosis of  By: Carlean Purl MD, [name]   Overview:  Overview:  Annotation: BULIMIA Qualifier: Diagnosis of  By: Bertram Gala    PONV (postoperative nausea and vomiting)    iv phenergan helps   REFLEX SYMPATHETIC DYSTROPHY 02/26/2009   Qualifier: Diagnosis of  By: Carlean Purl MD, Tonna Boehringer E    Rheumatoid arteritis ([place])    RSD (reflex sympathetic dystrophy)    Sleep apnea    Systemic lupus ([place]) 2021   Ulcer    gastric, duodenal ulcer    Past Surgical History:  Procedure Laterality Date   [place]     COLONOSCOPY     HYSTEROSCOPY WITH D & C N/A 11/11/2021   Procedure: DILATATION AND CURETTAGE /HYSTEROSCOPY;  Surgeon: Radene Gunning, MD;  Location: [facility];  Service: Gynecology;  Laterality: N/A;   KNEE ARTHROSCOPY  04/21/2012   right   LAPAROSCOPIC NISSEN FUNDOPLICATION  62/26/3335   Dr. Johnathan Hausen   laparoscopy     for endometriosis   LOOP RECORDER INSERTION Left 06/2021   lumbar spondylosis injected  2013   Dr. Mina Marble   NASAL SINUS  SURGERY     THORACOSCOPY W/ THORACIC SYMPATHECTOMY     for RSD   TONSILLECTOMY     UPPER GASTROINTESTINAL ENDOSCOPY      Current Medications: Current Meds  Medication Sig   azelastine (ASTELIN) 0.1 % nasal spray instill one SPRAY into BOTH nostrils twice daily   B-D 3CC LUER-LOK SYR 23GX1" 23G X 1" 3 ML MISC Inject 2 Needles into the skin See admin instructions.   Belimumab (BENLYSTA) 200 MG/ML SOSY once a week.   betamethasone dipropionate 0.05 % lotion Apply topically as needed.   busPIRone (BUSPAR) 15 MG tablet TAKE 1/2 TABLET BY MOUTH EVERY MORNING and TAKE 1/2 TABLET BY MOUTH EVERYDAY AT BEDTIME   cholecalciferol (VITAMIN D) 1000 UNITS tablet Take 2,000 Units by mouth daily.   Cranberry 200 MG CAPS Take 200 mg by mouth 2 (two) times daily.   cyanocobalamin (VITAMIN  B12) 1000 MCG/ML injection Inject 1 mL (1,000 mcg total) into the muscle every 30 (thirty) days.   estradiol (ESTRACE) 2 MG tablet Take 1 tablet (2 mg total) by mouth daily.   fluticasone (CUTIVATE) 0.05 % cream Apply 1 application topically 2 (two) times daily as needed (rash).    fluticasone (FLONASE) 50 MCG/ACT nasal spray USE TWO SPRAYS IN EACH NOSTRIL DAILY AS DIRECTED   folic acid (FOLVITE) 1 MG tablet Take 1 tablet (1 mg total) by mouth daily. (Patient taking differently: Take 1 mg by mouth in the morning and at bedtime.)   HYDROmorphone (DILAUDID) 4 MG tablet Take by mouth as needed for severe pain.   ketorolac (TORADOL) 30 MG/ML injection Inject 1 mL (30 mg total) into the muscle daily as needed for moderate pain.   lamoTRIgine (LAMICTAL) 100 MG tablet TAKE ONE TABLET BY MOUTH AT BREAKFAST AND AT BEDTIME   lansoprazole (PREVACID) 30 MG capsule Take 1 capsule (30 mg total) by mouth 2 (two) times daily before a meal.   levocetirizine (XYZAL) 5 MG tablet Take 5 mg by mouth in the morning and at bedtime.   levothyroxine (SYNTHROID) 125 MCG tablet Take 1 tablet (125 mcg total) by mouth every morning.   LYSINE PO Take 1 tablet by mouth daily.   medroxyPROGESTERone (PROVERA) 2.5 MG tablet Take 1 tablet PO daily   methadone (DOLOPHINE) 5 MG tablet Take 1 tablet by mouth 4 (four) times daily as needed.   ondansetron (ZOFRAN) 8 MG tablet Take 1 tablet (8 mg total) by mouth every 8 (eight) hours as needed for nausea or vomiting.   promethazine (PHENERGAN) 25 MG tablet TAKE ONE TABLET BY MOUTH FOR 8 HOURS AS NEEDED   ranitidine (ZANTAC) 150 MG capsule Take 150 mg by mouth once a week.   sertraline (ZOLOFT) 100 MG tablet Take 2 tablets (200 mg total) by mouth every morning.   sucralfate (CARAFATE) 1 g tablet Take 1 tablet (1 g total) by mouth 3 (three) times daily as needed for up to 14 days. (Patient taking differently: Take 1 g by mouth as needed.)   tiZANidine (ZANAFLEX) 4 MG capsule Take 3  capsules (12 mg total) by mouth at bedtime as needed for muscle spasms.   topiramate (TOPAMAX) 25 MG tablet Take by mouth.   triamcinolone cream (KENALOG) 0.1 % daily as needed.     Allergies:   Bee pollen, Diazepam, Prednisone, Amoxicillin, Doxycycline, Emetrol, Erythromycin, Gabapentin, Imuran [azathioprine], Methotrexate derivatives, Morphine, Propoxyphene n-acetaminophen, Sulfa antibiotics, Tetracyclines & related, and Propoxyphene   Social History   Socioeconomic History   Marital status: Divorced  Spouse name: Not on file   Number of children: 0   Years of education: 16   Highest education level: Bachelor's degree (e.g., BA, AB, BS)  Occupational History   Occupation: Dance movement psychotherapist: UNEMPLOYED  Tobacco Use   Smoking status: Never   Smokeless tobacco: Never  Vaping Use   Vaping Use: Never used  Substance and Sexual Activity   Alcohol use: No   Drug use: No   Sexual activity: Not Currently    Birth control/protection: None  Other Topics Concern   Not on file  Social History Narrative   Lives at home alone.   Caffeine use: Drinks coffee- 1 cup in morning   Ginger ale   Social Determinants of Health   Financial Resource Strain: High Risk (09/03/2021)   Overall Financial Resource Strain (CARDIA)    Difficulty of Paying Living Expenses: Hard  Food Insecurity: Food Insecurity Present (09/03/2021)   Hunger Vital Sign    Worried About Running Out of Food in the Last Year: Sometimes true    Ran Out of Food in the Last Year: Never true  Transportation Needs: No Transportation Needs (09/03/2021)   PRAPARE - Hydrologist (Medical): No    Lack of Transportation (Non-Medical): No  Physical Activity: Unknown (09/03/2021)   Exercise Vital Sign    Days of Exercise per Week: 0 days    Minutes of Exercise per Session: Not on file  Stress: Stress Concern Present (09/03/2021)   Cutter    Feeling of Stress : To some extent  Social Connections: Moderately Integrated (09/03/2021)   Social Connection and Isolation Panel [NHANES]    Frequency of Communication with Friends and Family: More than three times a week    Frequency of Social Gatherings with Friends and Family: Once a week    Attends Religious Services: More than 4 times per year    Active Member of [employer] or Organizations: Yes    Attends Music therapist: More than 4 times per year    Marital Status: Divorced     Family History: The patient's family history includes Breast cancer in her maternal aunt; Colon polyps in her brother, mother, and sister; Irritable bowel syndrome in her mother; Kidney Stones in her brother; Lung cancer in her maternal grandfather; Other in her mother; Raynaud syndrome in her mother; Stroke in her mother. There is no history of Colon cancer, Esophageal cancer, Rectal cancer, Stomach cancer, Migraines, or Seizures.  ROS:   Please see the history of present illness.    (+) Palpitations (+) Fatigue (+) Speech difficulty All other systems reviewed and are negative.  EKGs/Labs/Other Studies Reviewed:    The following studies were reviewed today:    Recent Labs: 10/23/2021: TSH 2.53 12/02/2021: ALT 10 02/04/2022: BUN 21; Creatinine, Ser 1.09; Hemoglobin 13.3; Platelets 274.0; Potassium 4.6; Sodium 138   Recent Lipid Panel    Component Value Date/Time   CHOL 180 10/23/2021 1124   TRIG 90.0 10/23/2021 1124   HDL 85.30 10/23/2021 1124   CHOLHDL 2 10/23/2021 1124   VLDL 18.0 10/23/2021 1124   LDLCALC 76 10/23/2021 1124    Physical Exam:    VS:  BP 110/74   Pulse 64   Ht '5\' 4"'$  (1.626 m)   Wt 175 lb (79.4 kg)   BMI 30.04 kg/m     Wt Readings from Last 3 Encounters:  10/14/22 175 lb (79.4  kg)  05/12/22 174 lb 8 oz (79.2 kg)  02/04/22 173 lb 8 oz (78.7 kg)     GEN: Well nourished, well developed in no acute distress CARDIAC:  RRR, no murmurs, rubs, gallops. ILR pocket well-healed.     ASSESSMENT:    1. Palpitations   2. Syncope and collapse    PLAN:    In order of problems listed above:  #Palpitations Likely secondary to PACs and PVCs which have been observed on her loop recorder.  Low burden overall.  Has previously not tolerated metoprolol.  Given her other medications, antiarrhythmic drug options are not a good option.  At this time, I think the likelihood of causing adverse reactions to medications exceeds the possible benefit of starting a new medication.  Would avoid for now and continue monitoring the heart rhythm using the loop recorder.  #Syncope Not arrhythmic.  Continue loop recorder monitoring  Follow-up with APP in 1 year.  Medication Adjustments/Labs and Tests Ordered: Current medicines are reviewed at length with the patient today.  Concerns regarding medicines are outlined above.   No orders of the defined types were placed in this encounter.  No orders of the defined types were placed in this encounter.  I,Mathew Stumpf,acting as a Education administrator for Vickie Epley, MD.,have documented all relevant documentation on the behalf of Vickie Epley, MD,as directed by  Vickie Epley, MD while in the presence of Vickie Epley, MD.  I, Vickie Epley, MD, have reviewed all documentation for this visit. The documentation on 10/14/22 for the exam, diagnosis, procedures, and orders are all accurate and complete.   Signed, Lars Mage, MD, [facility], [facility] 10/14/2022 2:56 PM    Electrophysiology [facility]

## 2022-10-14 NOTE — Progress Notes (Deleted)
Electrophysiology Office Follow up Visit Note:    Date:  10/14/2022   ID:  WYNEE MATARAZZO, DOB [birth year], MRN [id number]  PCP:  [name], [name], MD  [place] Cardiologist:  Quay Burow, MD  [facility] Electrophysiologist:  Vickie Epley, MD    Interval History:    Jo [name] is a 62 y.o. female who presents for a follow up visit. They were last seen in clinic June 26, 2021 for SVT and syncope.  She has had multiple episodes of syncope without prodrome.  A loop recorder was implanted which is shown PACs and PVCs but no arrhythmias that would cause syncope.       Past Medical History:  Diagnosis Date   Allergy    Anemia    Anxiety    Anxiety and depression    Arrhythmia 2021   Arthritis    Asthma    Cervical cancer [facility])    age 68    Chronic headaches    Colon polyp    COPD (chronic obstructive pulmonary disease) (HCC)    no o2   Depression    Dysrhythmia    Endometriosis    Fibromyalgia    Fundic gland polyps of stomach, benign    Gastritis    GERD (gastroesophageal reflux disease)    H. pylori infection 2013   H/O gastric ulcer 1985   History of anorexia nervosa    History of bulimia    History of pneumonia    Hypothyroidism    Irritable bowel syndrome (IBS)    Obesity    Personal history of other mental and behavioral disorders 01/14/2008   Qualifier: Diagnosis of  By: Carlean Purl MD, [name]   Overview:  Overview:  Annotation: BULIMIA Qualifier: Diagnosis of  By: Bertram Gala    PONV (postoperative nausea and vomiting)    iv phenergan helps   REFLEX SYMPATHETIC DYSTROPHY 02/26/2009   Qualifier: Diagnosis of  By: Carlean Purl MD, Tonna Boehringer E    Rheumatoid arteritis ([place])    RSD (reflex sympathetic dystrophy)    Sleep apnea    Systemic lupus ([place]) 2021   Ulcer    gastric, duodenal ulcer    Past Surgical History:  Procedure Laterality Date   [place]     COLONOSCOPY      HYSTEROSCOPY WITH D & C N/A 11/11/2021   Procedure: DILATATION AND CURETTAGE /HYSTEROSCOPY;  Surgeon: Radene Gunning, MD;  Location: [place];  Service: Gynecology;  Laterality: N/A;   KNEE ARTHROSCOPY  04/21/2012   right   LAPAROSCOPIC NISSEN FUNDOPLICATION  86/76/1950   Dr. Johnathan Hausen   laparoscopy     for endometriosis   LOOP RECORDER INSERTION Left 06/2021   lumbar spondylosis injected  2013   Dr. Mina Marble   NASAL SINUS SURGERY     THORACOSCOPY W/ THORACIC SYMPATHECTOMY     for RSD   TONSILLECTOMY     UPPER GASTROINTESTINAL ENDOSCOPY      Current Medications: No outpatient medications have been marked as taking for the 10/14/22 encounter (Appointment) with Vickie Epley, MD.     Allergies:   Bee pollen, Diazepam, Prednisone, Amoxicillin, Doxycycline, Emetrol, Erythromycin, Gabapentin, Imuran [azathioprine], Methotrexate derivatives, Morphine, Propoxyphene n-acetaminophen, Sulfa antibiotics, Tetracyclines & related, and Propoxyphene   Social History   Socioeconomic History   Marital status: Divorced    Spouse name: Not on file   Number of  children: 0   Years of education: 16   Highest education level: Bachelor's degree (e.g., BA, AB, BS)  Occupational History   Occupation: Dance movement psychotherapist: UNEMPLOYED  Tobacco Use   Smoking status: Never   Smokeless tobacco: Never  Vaping Use   Vaping Use: Never used  Substance and Sexual Activity   Alcohol use: No   Drug use: No   Sexual activity: Not Currently    Birth control/protection: None  Other Topics Concern   Not on file  Social History Narrative   Lives at home alone.   Caffeine use: Drinks coffee- 1 cup in morning   Ginger ale   Social Determinants of Health   Financial Resource Strain: High Risk (09/03/2021)   Overall Financial Resource Strain (CARDIA)    Difficulty of Paying Living Expenses: Hard  Food Insecurity: Food Insecurity Present (09/03/2021)   Hunger  Vital Sign    Worried About Running Out of Food in the Last Year: Sometimes true    Ran Out of Food in the Last Year: Never true  Transportation Needs: No Transportation Needs (09/03/2021)   PRAPARE - Hydrologist (Medical): No    Lack of Transportation (Non-Medical): No  Physical Activity: Unknown (09/03/2021)   Exercise Vital Sign    Days of Exercise per Week: 0 days    Minutes of Exercise per Session: Not on file  Stress: Stress Concern Present (09/03/2021)   [place]    Feeling of Stress : To some extent  Social Connections: Moderately Integrated (09/03/2021)   Social Connection and Isolation Panel [NHANES]    Frequency of Communication with Friends and Family: More than three times a week    Frequency of Social Gatherings with Friends and Family: Once a week    Attends Religious Services: More than 4 times per year    Active Member of [employer] or Organizations: Yes    Attends Music therapist: More than 4 times per year    Marital Status: Divorced     Family History: The patient's family history includes Breast cancer in her maternal aunt; Colon polyps in her brother, mother, and sister; Irritable bowel syndrome in her mother; Kidney Stones in her brother; Lung cancer in her maternal grandfather; Other in her mother; Raynaud syndrome in her mother; Stroke in her mother. There is no history of Colon cancer, Esophageal cancer, Rectal cancer, Stomach cancer, Migraines, or Seizures.  ROS:   Please see the history of present illness.    All other systems reviewed and are negative.  EKGs/Labs/Other Studies Reviewed:    The following studies were reviewed today:   Recent Labs: 10/23/2021: TSH 2.53 12/02/2021: ALT 10 02/04/2022: BUN 21; Creatinine, Ser 1.09; Hemoglobin 13.3; Platelets 274.0; Potassium 4.6; Sodium 138  Recent Lipid Panel    Component Value Date/Time   CHOL 180  10/23/2021 1124   TRIG 90.0 10/23/2021 1124   HDL 85.30 10/23/2021 1124   CHOLHDL 2 10/23/2021 1124   VLDL 18.0 10/23/2021 1124   LDLCALC 76 10/23/2021 1124    Physical Exam:    VS:  There were no vitals taken for this visit.    Wt Readings from Last 3 Encounters:  05/12/22 174 lb 8 oz (79.2 kg)  02/04/22 173 lb 8 oz (78.7 kg)  11/11/21 143 lb 6.4 oz (65 kg)     GEN: *** Well nourished, well developed in no acute distress CARDIAC: ***RRR,  no murmurs, rubs, gallops     ASSESSMENT:    No diagnosis found. PLAN:    In order of problems listed above:  #Palpitations Likely secondary to PACs and PVCs which have been observed on her loop recorder.  Low burden overall.  Has previously not tolerated metoprolol.  Given her other medications, antiarrhythmic drug options are not a good option.  Will plan to start low-dose diltiazem and monitor for effect.  She should follow-up with an APP in 3 months to see how she is feeling.  #Syncope Not arrhythmic.  Continue loop recorder monitoring  Follow-up with APP in 3 months.    Medication Adjustments/Labs and Tests Ordered: Current medicines are reviewed at length with the patient today.  Concerns regarding medicines are outlined above.  No orders of the defined types were placed in this encounter.  No orders of the defined types were placed in this encounter.    Signed, Lars Mage, MD, [facility], [facility] 10/14/2022 5:26 AM    Electrophysiology [facility]

## 2022-10-14 NOTE — Patient Instructions (Signed)
Medication Instructions:  Your physician recommends that you continue on your current medications as directed. Please refer to the Current Medication list given to you today.  *If you need a refill on your cardiac medications before your next appointment, please call your pharmacy*  Follow-Up: At [facility], you and your health needs are our priority.  As part of our continuing mission to provide you with exceptional heart care, we have created designated Provider Care Teams.  These Care Teams include your primary Cardiologist (physician) and Advanced Practice Providers (APPs -  Physician Assistants and Nurse Practitioners) who all work together to provide you with the care you need, when you need it.  Your next appointment:   1 year(s)  Provider:   You will see one of the following Advanced Practice Providers on your designated Care Team:   Renee Ursuy, PA-C Michael "Andy" Tillery, PA-C Suzann Riddle, NP  

## 2022-10-20 ENCOUNTER — Ambulatory Visit: Payer: PPO | Admitting: Cardiovascular Disease

## 2022-10-21 NOTE — Progress Notes (Signed)
Carelink Summary Report / Loop Recorder

## 2022-10-22 ENCOUNTER — Telehealth: Payer: Self-pay

## 2022-10-22 DIAGNOSIS — Z79891 Long term (current) use of opiate analgesic: Secondary | ICD-10-CM | POA: Diagnosis not present

## 2022-10-22 DIAGNOSIS — M542 Cervicalgia: Secondary | ICD-10-CM | POA: Diagnosis not present

## 2022-10-22 DIAGNOSIS — G90512 Complex regional pain syndrome I of left upper limb: Secondary | ICD-10-CM | POA: Diagnosis not present

## 2022-10-22 DIAGNOSIS — G894 Chronic pain syndrome: Secondary | ICD-10-CM | POA: Diagnosis not present

## 2022-10-22 NOTE — Progress Notes (Signed)
Patient ID: Jo [name], female   DOB: August 25, 1961, 62 y.o.   MRN: [id number]  Care Management & Coordination Services Pharmacy Team  Reason for Encounter: Reschedule with Pharmacist    What concerns do you have about your medications? Patient reports she has recently changed to Health team Adv and has been informed they will not cover certain medications with [facility] as her preferred. She reports she would like clarification on what medications will be increased cost to her before her next delivery with the pharmacy in March. Offered her a phone visit to discuss with [facility] Pharm on next week and she as in agreement.   The patient denies side effects with their medications.   How often do you forget or accidentally miss a dose? Never  Do you use a pillbox? No  Are you having any problems getting your medications from your pharmacy? No     Chart Updates:  Recent office visits:  None  Recent consult visits:  10/14/22 Vickie Epley, MD (Cardiology) - Patient presented for Palpitations and other concerns. Stopped Clindamycin. Stopped Erythromycin.   09/24/22 [name], MD (Rheumatology) - Patient presented for Initial Consult. No medication changes.   Hospital visits:  None in previous 6 months  Medications: Outpatient Encounter Medications as of 10/22/2022  Medication Sig   azelastine (ASTELIN) 0.1 % nasal spray instill one SPRAY into BOTH nostrils twice daily   B-D 3CC LUER-LOK SYR 23GX1" 23G X 1" 3 ML MISC Inject 2 Needles into the skin See admin instructions.   Belimumab (BENLYSTA) 200 MG/ML SOSY once a week.   betamethasone dipropionate 0.05 % lotion Apply topically as needed.   busPIRone (BUSPAR) 15 MG tablet TAKE 1/2 TABLET BY MOUTH EVERY MORNING and TAKE 1/2 TABLET BY MOUTH EVERYDAY AT BEDTIME   cholecalciferol (VITAMIN D) 1000 UNITS tablet Take 2,000 Units by mouth daily.   Cranberry 200 MG CAPS Take 200 mg by mouth 2 (two) times daily.    cyanocobalamin (VITAMIN B12) 1000 MCG/ML injection Inject 1 mL (1,000 mcg total) into the muscle every 30 (thirty) days.   estradiol (ESTRACE) 2 MG tablet Take 1 tablet (2 mg total) by mouth daily.   fluticasone (CUTIVATE) 0.05 % cream Apply 1 application topically 2 (two) times daily as needed (rash).    fluticasone (FLONASE) 50 MCG/ACT nasal spray USE TWO SPRAYS IN EACH NOSTRIL DAILY AS DIRECTED   folic acid (FOLVITE) 1 MG tablet Take 1 tablet (1 mg total) by mouth daily. (Patient taking differently: Take 1 mg by mouth in the morning and at bedtime.)   HYDROmorphone (DILAUDID) 4 MG tablet Take by mouth as needed for severe pain.   ketorolac (TORADOL) 30 MG/ML injection Inject 1 mL (30 mg total) into the muscle daily as needed for moderate pain.   lamoTRIgine (LAMICTAL) 100 MG tablet TAKE ONE TABLET BY MOUTH AT BREAKFAST AND AT BEDTIME   lansoprazole (PREVACID) 30 MG capsule Take 1 capsule (30 mg total) by mouth 2 (two) times daily before a meal.   levocetirizine (XYZAL) 5 MG tablet Take 5 mg by mouth in the morning and at bedtime.   levothyroxine (SYNTHROID) 125 MCG tablet Take 1 tablet (125 mcg total) by mouth every morning.   LYSINE PO Take 1 tablet by mouth daily.   medroxyPROGESTERone (PROVERA) 2.5 MG tablet Take 1 tablet PO daily   methadone (DOLOPHINE) 5 MG tablet Take 1 tablet by mouth 4 (four) times daily as needed.   metoCLOPramide (REGLAN) 10 MG  tablet Take 1 tablet (10 mg total) by mouth every 6 (six) hours as needed for nausea. (Patient not taking: Reported on 10/14/2022)   ondansetron (ZOFRAN) 8 MG tablet Take 1 tablet (8 mg total) by mouth every 8 (eight) hours as needed for nausea or vomiting.   promethazine (PHENERGAN) 25 MG tablet TAKE ONE TABLET BY MOUTH FOR 8 HOURS AS NEEDED   ranitidine (ZANTAC) 150 MG capsule Take 150 mg by mouth once a week.   sertraline (ZOLOFT) 100 MG tablet Take 2 tablets (200 mg total) by mouth every morning.   sucralfate (CARAFATE) 1 g tablet Take 1  tablet (1 g total) by mouth 3 (three) times daily as needed for up to 14 days. (Patient taking differently: Take 1 g by mouth as needed.)   tiZANidine (ZANAFLEX) 4 MG capsule Take 3 capsules (12 mg total) by mouth at bedtime as needed for muscle spasms.   topiramate (TOPAMAX) 25 MG tablet Take by mouth.   triamcinolone cream (KENALOG) 0.1 % daily as needed.   No facility-administered encounter medications on file as of 10/22/2022.    Recent vitals BP Readings from Last 3 Encounters:  10/14/22 110/74  05/12/22 122/70  02/04/22 112/72   Pulse Readings from Last 3 Encounters:  10/14/22 64  05/12/22 74  02/04/22 75   Wt Readings from Last 3 Encounters:  10/14/22 175 lb (79.4 kg)  05/12/22 174 lb 8 oz (79.2 kg)  02/04/22 173 lb 8 oz (78.7 kg)   BMI Readings from Last 3 Encounters:  10/14/22 30.04 kg/m  05/12/22 29.95 kg/m  02/04/22 29.78 kg/m    Recent lab results    Component Value Date/Time   NA 138 02/04/2022 1213   NA 140 12/02/2021 0000   K 4.6 02/04/2022 1213   CL 106 02/04/2022 1213   CO2 24 02/04/2022 1213   GLUCOSE 95 02/04/2022 1213   BUN 21 02/04/2022 1213   BUN 17 12/02/2021 0000   CREATININE 1.09 02/04/2022 1213   CREATININE 1.08 (H) 04/28/2021 1313   CALCIUM 9.7 02/04/2022 1213    Lab Results  Component Value Date   CREATININE 1.09 02/04/2022   GFR 56.02 (L) 02/04/2022   EGFR 63 03/11/2021   GFRNONAA 60 (L) 04/28/2021   GFRAA >60 05/15/2020   Lab Results  Component Value Date/Time   HGBA1C 5.6 11/15/2019 09:49 AM    Lab Results  Component Value Date   CHOL 180 10/23/2021   HDL 85.30 10/23/2021   LDLCALC 76 10/23/2021   TRIG 90.0 10/23/2021   CHOLHDL 2 10/23/2021    Care Gaps: Annual wellness visit in last year? No Eye Exam - Overdue Diabetic Urine - DONE 01/2022 TDAP - Overdue Flu Vaccine - Overdue COVID Booster - Overdue HIV Screen - Postponed    Star Rating Drugs:  None      Ned Clines Carver Clinical Pharmacist  Assistant [phone]

## 2022-10-22 NOTE — Progress Notes (Signed)
Care Management & Coordination Services Pharmacy Note  10/26/2022 Name:  Jo [name] MRN:  [id number] DOB:  1961/05/20  Summary: -No new health concerns today -Medication list updated to reflect ranitidine no longer being on the market (pt had been taking expired med) -Start famotidine OTC in place of ranitidine once weekly to aid with Benlysta injection reaction  Recommendations/Changes made from today's visit: -STOP ranitidine 130m once weekly (expired) and START famotidine 120m1-2 tabs OTC once weekly to aid with injection site rxn form Benlysta. -Continue other medication therapies  Follow up plan: Pharmacist visit in 6 months   Subjective: Jo [name] an [age].o. year old female who is a primary patient of Jo [name].  The care coordination team was consulted for assistance with disease management and care coordination needs.    Engaged with patient by telephone for follow up visit.  Recent office visits: None  Recent consult visits: 10/14/22 LaVickie EpleyMD (Cardiology) - Patient presented for Palpitations and other concerns. Stopped Clindamycin. Stopped Erythromycin. Continue loop recorder monitoring for syncopal episodes   09/24/22 ArLahoma RockerMD (Rheumatology) - Patient presented for Initial Consult. No medication changes.  Hospital visits: None in previous 6 months   Objective:  Lab Results  Component Value Date   CREATININE 1.09 02/04/2022   BUN 21 02/04/2022   GFR 56.02 (L) 02/04/2022   EGFR 63 03/11/2021   GFRNONAA 60 (L) 04/28/2021   GFRAA >60 05/15/2020   NA 138 02/04/2022   K 4.6 02/04/2022   CALCIUM 9.7 02/04/2022   CO2 24 02/04/2022   GLUCOSE 95 02/04/2022    Lab Results  Component Value Date/Time   HGBA1C 5.6 11/15/2019 09:49 AM   GFR 56.02 (L) 02/04/2022 12:13 PM   GFR 63.00 10/23/2021 11:24 AM    Last diabetic Eye exam: No results found for: "HMDIABEYEEXA"  Last diabetic Foot exam: No results found for:  "HMDIABFOOTEX"   Lab Results  Component Value Date   CHOL 180 10/23/2021   HDL 85.30 10/23/2021   LDLCALC 76 10/23/2021   TRIG 90.0 10/23/2021   CHOLHDL 2 10/23/2021       Latest Ref Rng & Units 12/02/2021   12:00 AM 10/23/2021   11:24 AM 08/28/2021   12:00 AM  Hepatic Function  Total Protein 6.0 - 8.3 g/dL  7.1    Albumin 3.5 - 5.0 4.0     4.4  4.3      AST 13 - 35 16     15  17      $ ALT 7 - 35 U/L 10     5  11      $ Alk Phosphatase 25 - 125 68     45  59      Total Bilirubin 0.2 - 1.2 mg/dL  0.3       This result is from an external source.    Lab Results  Component Value Date/Time   TSH 2.53 10/23/2021 11:24 AM   TSH 1.260 03/11/2021 03:36 PM       Latest Ref Rng & Units 02/04/2022   12:13 PM 12/02/2021   12:00 AM 10/23/2021   11:24 AM  CBC  WBC 4.0 - 10.5 K/uL 5.5  5.5     4.3   Hemoglobin 12.0 - 15.0 g/dL 13.3  13.1     12.6   Hematocrit 36.0 - 46.0 % 39.9  40     38.4   Platelets 150.0 - 400.0 K/uL 274.0  241     231.0      This result is from an external source.    Lab Results  Component Value Date/Time   VD25OH 99.35 10/23/2021 11:24 AM   VD25OH 83.61 04/27/2018 03:38 PM   [id number] 10/23/2021 11:24 AM   VITAMINB12 629 12/13/2019 09:36 AM    Clinical ASCVD: No  The 10-year ASCVD risk score (Arnett DK, et al., 2019) is: 2.8%   Values used to calculate the score:     Age: 61 years     Sex: Female     Is Non-Hispanic African American: No     Diabetic: Yes     Tobacco smoker: No     Systolic Blood Pressure: [id number] mmHg     Is BP treated: No     HDL Cholesterol: 85.3 mg/dL     Total Cholesterol: 180 mg/dL        05/12/2022    2:26 PM 05/12/2022    2:25 PM 10/29/2021   11:01 AM  Depression screen PHQ 2/9  Decreased Interest 0 0 0  Down, Depressed, Hopeless 0 0 0  PHQ - 2 Score 0 0 0  Altered sleeping 0  0  Tired, decreased energy 2 2 2  $ Change in appetite 0  0  Feeling bad or failure about yourself  1  1  Trouble concentrating 0  0  Moving  slowly or fidgety/restless 1  0  Suicidal thoughts 0  0  PHQ-9 Score 4  3  Difficult doing work/chores Somewhat difficult       Social History   Tobacco Use  Smoking Status Never  Smokeless Tobacco Never   BP Readings from Last 3 Encounters:  10/14/22 110/74  05/12/22 122/70  02/04/22 112/72   Pulse Readings from Last 3 Encounters:  10/14/22 64  05/12/22 74  02/04/22 75   Wt Readings from Last 3 Encounters:  10/14/22 175 lb (79.4 kg)  05/12/22 174 lb 8 oz (79.2 kg)  02/04/22 173 lb 8 oz (78.7 kg)   BMI Readings from Last 3 Encounters:  10/14/22 30.04 kg/m  05/12/22 29.95 kg/m  02/04/22 29.78 kg/m    Allergies  Allergen Reactions   Bee Pollen Anaphylaxis   Diazepam     sucidal depression   Prednisone Nausea And Vomiting    Per pt can not take oral steroids    Amoxicillin     Severe diarrhea At high dose    Doxycycline     Pt does not remember reaction   Emetrol     Other reaction(s): GI upset   Erythromycin     n & v   Gabapentin     Doesn't remember; didn't tolerate   Imuran [Azathioprine]     Patient states this was discontinued due to liver problems   Methotrexate Derivatives     diarrhea   Morphine     n & v   Propoxyphene N-Acetaminophen     n & V   Sulfa Antibiotics     MIGRAINES   Tetracyclines & Related     N & v   Propoxyphene Other (See Comments)    Medications Reviewed Today     Reviewed by Bernestine Amass, RN (Registered Nurse) on 10/14/22 at 1437  Med List Status: <None>   Medication Order Taking? Sig Documenting Provider Last Dose Status Informant  azelastine (ASTELIN) 0.1 % nasal spray [id number] Yes instill one SPRAY into BOTH nostrils twice daily Koberlein, Steele Berg, MD Taking Active  B-D 3CC LUER-LOK SYR 23GX1" 23G X 1" 3 ML MISC [id number] Yes Inject 2 Needles into the skin See admin instructions. Kennyth Arnold, FNP Taking Active   Belimumab [facility]) 200 MG/ML Babette Relic [id number] Yes once a week. [provider]  Taking Active   betamethasone dipropionate 0.05 % lotion [id number] Yes Apply topically as needed. [provider] Taking Active   busPIRone (BUSPAR) 15 MG tablet [id number] Yes TAKE 1/2 TABLET BY MOUTH EVERY MORNING and TAKE 1/2 TABLET BY MOUTH EVERYDAY AT BEDTIME [name], [name], MD Taking Active   cholecalciferol (VITAMIN D) 1000 UNITS tablet [id number] Yes Take 2,000 Units by mouth daily. [provider] Taking Active Self  Cranberry 200 MG CAPS [id number] Yes Take 200 mg by mouth 2 (two) times daily. [provider] Taking Active Self           Med Note Alvin Critchley   Wed Feb 05, 2021 10:45 AM)    cyanocobalamin (VITAMIN B12) 1000 MCG/ML injection [id number] Yes Inject 1 mL (1,000 mcg total) into the muscle every 30 (thirty) days. [name], [name], MD Taking Active   estradiol (ESTRACE) 2 MG tablet [id number] Yes Take 1 tablet (2 mg total) by mouth daily. [name], [name], MD Taking Active   fluticasone (CUTIVATE) 0.05 % cream [id number] Yes Apply 1 application topically 2 (two) times daily as needed (rash).  [provider] Taking Active Self  fluticasone (FLONASE) 50 MCG/ACT nasal spray [id number] Yes USE TWO SPRAYS IN EACH NOSTRIL DAILY AS DIRECTED Koberlein, Junell C, MD Taking Active   folic acid (FOLVITE) 1 MG tablet [id number] Yes Take 1 tablet (1 mg total) by mouth daily.  Patient taking differently: Take 1 mg by mouth in the morning and at bedtime.   Gardenia Phlegm, NP Taking Active   HYDROmorphone (DILAUDID) 4 MG tablet [id number] Yes Take by mouth as needed for severe pain. [provider] Taking Active   ketorolac (TORADOL) 30 MG/ML injection [id number] Yes Inject 1 mL (30 mg total) into the muscle daily as needed for moderate pain. Caren Macadam, MD Taking Active   lamoTRIgine (LAMICTAL) 100 MG tablet [id number] Yes TAKE ONE TABLET BY MOUTH AT BREAKFAST AND AT BEDTIME [name], [name], MD Taking Active   lansoprazole (PREVACID) 30 MG  capsule [id number] Yes Take 1 capsule (30 mg total) by mouth 2 (two) times daily before a meal. Koberlein, Steele Berg, MD Taking Active   levocetirizine (XYZAL) 5 MG tablet [id number] Yes Take 5 mg by mouth in the morning and at bedtime. [provider] Taking Active   levothyroxine (SYNTHROID) 125 MCG tablet [id number] Yes Take 1 tablet (125 mcg total) by mouth every morning. [name], [name], MD Taking Active   LYSINE PO [id number] Yes Take 1 tablet by mouth daily. [provider] Taking Active   medroxyPROGESTERone (PROVERA) 2.5 MG tablet [id number] Yes Take 1 tablet PO daily [name], [name], MD Taking Active   methadone (DOLOPHINE) 5 MG tablet [id number] Yes Take 1 tablet by mouth 4 (four) times daily as needed. [provider] Taking Active   metoCLOPramide (REGLAN) 10 MG tablet [id number] No Take 1 tablet (10 mg total) by mouth every 6 (six) hours as needed for nausea.  Patient not taking: Reported on 10/14/2022   Gatha Mayer, MD Not Taking Active   ondansetron [facility]) 8 MG tablet [id number] Yes Take 1 tablet (8 mg total) by mouth every 8 (eight) hours as needed for nausea or vomiting. Gatha Mayer, MD  Taking Active   promethazine (PHENERGAN) 25 MG tablet [id number] Yes TAKE ONE TABLET BY MOUTH FOR 8 HOURS AS NEEDED Gatha Mayer, MD Taking Active   ranitidine (ZANTAC) 150 MG capsule [id number] Yes Take 150 mg by mouth once a week. [provider] Taking Active   sertraline (ZOLOFT) 100 MG tablet [id number] Yes Take 2 tablets (200 mg total) by mouth every morning. [name], [name], MD Taking Active   sucralfate (CARAFATE) 1 g tablet [id number] Yes Take 1 tablet (1 g total) by mouth 3 (three) times daily as needed for up to 14 days.  Patient taking differently: Take 1 g by mouth as needed.   Gardenia Phlegm, NP Taking Active   tiZANidine (ZANAFLEX) 4 MG capsule [id number] Yes Take 3 capsules (12 mg total) by mouth at bedtime as needed for muscle spasms.  Caren Macadam, MD Taking Active   topiramate (TOPAMAX) 25 MG tablet [id number] Yes Take by mouth. [provider] Taking Active   triamcinolone cream (KENALOG) 0.1 % [id number] Yes daily as needed. [provider] Taking Active             SDOH:  (Social Determinants of Health) assessments and interventions performed: Yes SDOH Interventions    Flowsheet Row Office Visit from 05/12/2022 in [place] at [place] from 10/09/2020 in [place] at [place] Management from 01/08/2020 in [place] at [place] Interventions -- Intervention Not Indicated Other (Comment)  [Not needed]  Housing Interventions -- Intervention Not Indicated --  Transportation Interventions -- Intervention Not Indicated Other (Comment)  [Not needed]  Depression Interventions/Treatment  Currently on Treatment PHQ2-9 Score <4 Follow-up Not Indicated --  Financial Strain Interventions -- Intervention Not Indicated --  Physical Activity Interventions -- Intervention Not Indicated --  Stress Interventions -- Intervention Not Indicated --  Social Connections Interventions -- Intervention Not Indicated --       Medication Assistance: None required.  Patient affirms current coverage meets needs.  Medication Access: Within the past 30 days, how often has patient missed a dose of medication? No Is a pillbox or other method used to improve adherence? Yes  Factors that may affect medication adherence? no barriers identified Are meds synced by current pharmacy? Yes  Are meds delivered by current pharmacy? Yes  Does patient experience delays in picking up medications due to transportation concerns? No   [facility] Reviewed: Is patient disadvantaged to use [facility]?: No  Current Rx insurance plan: HTA Name and location of Current pharmacy:  [facility] [place], [place] - [place] [address] [place] [address] Phone: [phone] Fax: [phone]  [facility] services reviewed with patient today?: Yes  Patient requests to transfer care to [facility]?: No  Reason patient declined to change pharmacies: Patient is already actively enrolled with [facility]  Compliance/Adherence/Medication fill history: Care Gaps: AWV Tdap/Flu/Covid vaccine    Star-Rating Drugs: None   Assessment/Plan   Depression/Anxiety (Goal: Maintain a relaxed, satisfied mood) -Controlled -Current treatment: Buspirone 38m 1/2 tab in morning and 1/2 tab at night Appropriate, Effective, Safe, Accessible Lamotrigine 1040m1 tab at breakfast and 1 at bedtime Appropriate, Effective, Safe, Accessible Sertraline 10056m tabs every morning Appropriate, Effective, Safe, Accessible -Medications previously tried/failed: None -PHQ9: Not performed today -GAD7: Not performed today -Recommended to continue current medication  Hypothyroidism (Goal: TSH WNL) -  Controlled -Current treatment  Levothyroxine 119mg 1 tab qam Appropriate, Effective, Safe, Accessible -Medications previously tried: None  -Recommended to continue current medication  GERD (Goal: Reduce symptoms of acid reflux/indigestion/injection site reactions) -Controlled -Current treatment  Lansoprazole 393mBID Appropriate, Effective, Safe, Accessible Ranitidine 15053mnce a week Appropriate, Effective, Safe, Query Accessible -Medications previously tried: Omeprazole, Dexilant -Ranitidine is no longer on the market, patient has been taking from expired rx bottle that she had left on hand each week w/her Benlysta injections to limit injection site rxns.  -Recommended to continue lansoprazole as prescribed -Recommend switch to famotidine 10-84m40mC with Benlysta rx  AngeMaren Reamernical Pharmacist [phone]

## 2022-10-25 LAB — CUP PACEART REMOTE DEVICE CHECK
Date Time Interrogation Session: 20240203230400
Implantable Pulse Generator Implant Date: 20221006

## 2022-10-26 ENCOUNTER — Ambulatory Visit: Payer: PPO

## 2022-10-26 DIAGNOSIS — R55 Syncope and collapse: Secondary | ICD-10-CM

## 2022-10-27 ENCOUNTER — Telehealth: Payer: Self-pay

## 2022-10-27 DIAGNOSIS — L03011 Cellulitis of right finger: Secondary | ICD-10-CM | POA: Diagnosis not present

## 2022-10-27 DIAGNOSIS — L603 Nail dystrophy: Secondary | ICD-10-CM | POA: Diagnosis not present

## 2022-10-27 DIAGNOSIS — L309 Dermatitis, unspecified: Secondary | ICD-10-CM | POA: Diagnosis not present

## 2022-10-27 DIAGNOSIS — L03012 Cellulitis of left finger: Secondary | ICD-10-CM | POA: Diagnosis not present

## 2022-10-27 NOTE — Progress Notes (Signed)
Patient ID: Jo [name], female   DOB: Feb 12, 1961, 62 y.o.   MRN: [id number]  Care Management & Coordination Services Pharmacy Team  Reason for Encounter: Appointment Reminder  Contacted patient to confirm telephone appointment with Theo Dills, PharmD on 10/28/22 at 1:45. Unsuccessful outreach. Left voicemail for patient to return call.    Care Gaps: Annual wellness visit in last year? No Eye Exam - Overdue Diabetic Urine - DONE 01/2022 TDAP - Overdue Flu Vaccine - Overdue COVID Booster - Overdue HIV Screen - Postponed       Star Rating Drugs:  None   Ned Clines [place] Clinical Pharmacist Assistant [phone]

## 2022-10-28 ENCOUNTER — Ambulatory Visit: Payer: PPO

## 2022-11-10 DIAGNOSIS — M1990 Unspecified osteoarthritis, unspecified site: Secondary | ICD-10-CM | POA: Diagnosis not present

## 2022-11-10 DIAGNOSIS — R5383 Other fatigue: Secondary | ICD-10-CM | POA: Diagnosis not present

## 2022-11-10 DIAGNOSIS — K219 Gastro-esophageal reflux disease without esophagitis: Secondary | ICD-10-CM | POA: Diagnosis not present

## 2022-11-10 DIAGNOSIS — G8929 Other chronic pain: Secondary | ICD-10-CM | POA: Diagnosis not present

## 2022-11-10 DIAGNOSIS — I7300 Raynaud's syndrome without gangrene: Secondary | ICD-10-CM | POA: Diagnosis not present

## 2022-11-10 DIAGNOSIS — R768 Other specified abnormal immunological findings in serum: Secondary | ICD-10-CM | POA: Diagnosis not present

## 2022-11-10 DIAGNOSIS — Z79899 Other long term (current) drug therapy: Secondary | ICD-10-CM | POA: Diagnosis not present

## 2022-11-10 DIAGNOSIS — G9050 Complex regional pain syndrome I, unspecified: Secondary | ICD-10-CM | POA: Diagnosis not present

## 2022-11-10 DIAGNOSIS — M329 Systemic lupus erythematosus, unspecified: Secondary | ICD-10-CM | POA: Diagnosis not present

## 2022-11-10 DIAGNOSIS — M797 Fibromyalgia: Secondary | ICD-10-CM | POA: Diagnosis not present

## 2022-11-10 NOTE — Progress Notes (Unsigned)
HPI: [name] is a 62 y.o. female with PMHx significant for seasonal allergies, SVT, SLE, reflex sympathetic dystrophy/complex regional pain syndrome I, chronic pain, GERD, migraine headaches, fibromyalgia, depression, and anxiety  here today for chronic disease management. She was last seen on 05/12/22. No new problems since her last visit.  Hypothyroidism on Levothyroxine  125 mcg daily. Palpitations and fatigue stable.  Lab Results  Component Value Date   TSH 2.53 10/23/2021   Palpitations, chronic. She follows with cardiologist. Clarnce Flock Dr Quentin Ore on 10/14/22. Hx of syncopal episodes, no recently. Loop recorder showed PACs and PVCs but no arrhythmias that would cause syncope.  She has not tolerated BB.  Lab Results  Component Value Date   CREATININE 1.09 02/04/2022   BUN 21 02/04/2022   NA 138 02/04/2022   K 4.6 02/04/2022   CL 106 02/04/2022   CO2 24 02/04/2022   Menopausal vasomotor symptoms: She is on hormonal replacement therapy, which ahs helped. Currently on medroxyprogesterone 2.5 mg daily and estradiol 2 mg daily. Symptoms reoccurred when she tries to discontinue medication. Last mammogram 11/2021 Bi-Rads 1.  Depression and anxiety on Lamictal 100 mg twice daily, sertraline 100 mg 2 tablets daily, and BuSpar 15 mg 1/2 tablet bid. She has been on sertraline since 2013-2014. No hx of bipolar disorder.  Today she is c/o congestion and cough for about a month. She reports that she has been coughing for 20 years, but recently she has been coughing up yellowish sputum, negative for hemoptysis.  She also experiences nasal congestion,rhinorrhea,and post nasal drainage. Negative for wheezing and SOB. Remote hx of asthma.  She reports occasional nose bleed, which she attributes to the use of Afrin nasal spray.  Concerned about possible sinus infection. She has been using Afrin, Astelin nasal spray, and Flonase nasal spray. Negative for fever, has had some chills. No  known sick contact.  -SLE, fibromyalgia,and Raynaud's phenomenon: States that she saw her rheumatologist yesterday and received a steroid shot for LUE pain.  Chronic regional pain syndrome (CRPS): Follows with pain management.  Review of Systems  Constitutional:  Positive for fatigue. Negative for activity change, appetite change and unexpected weight change.  HENT:  Positive for nosebleeds, postnasal drip and rhinorrhea. Negative for facial swelling, mouth sores, sinus pain and sore throat.   Cardiovascular:  Negative for chest pain and leg swelling.  Gastrointestinal:  Positive for nausea (chronic). Negative for abdominal pain and vomiting.  Endocrine: Negative for cold intolerance and heat intolerance.  Genitourinary:  Negative for decreased urine volume, dysuria and hematuria.  Musculoskeletal:  Positive for arthralgias and myalgias.  Allergic/Immunologic: Positive for environmental allergies.  Neurological:  Negative for facial asymmetry and weakness.  Psychiatric/Behavioral:  Negative for confusion. The patient is nervous/anxious.   See other pertinent positives and negatives in HPI.  Current Outpatient Medications on File Prior to Visit  Medication Sig Dispense Refill   B-D 3CC LUER-LOK SYR 23GX1" 23G X 1" 3 ML MISC Inject 2 Needles into the skin See admin instructions. 6 each 3   Belimumab (BENLYSTA) 200 MG/ML SOSY once a week.     betamethasone dipropionate 0.05 % lotion Apply topically as needed.     busPIRone (BUSPAR) 15 MG tablet TAKE 1/2 TABLET BY MOUTH EVERY MORNING and TAKE 1/2 TABLET BY MOUTH EVERYDAY AT BEDTIME 90 tablet 1   cholecalciferol (VITAMIN D) 1000 UNITS tablet Take 2,000 Units by mouth daily.     Cranberry 200 MG CAPS Take 200 mg by mouth 2 (  two) times daily.     cyanocobalamin (VITAMIN B12) 1000 MCG/ML injection Inject 1 mL (1,000 mcg total) into the muscle every 30 (thirty) days. 3 mL 1   estradiol (ESTRACE) 2 MG tablet Take 1 tablet (2 mg total) by mouth  daily. 90 tablet 2   fluticasone (CUTIVATE) 0.05 % cream Apply 1 application topically 2 (two) times daily as needed (rash).      fluticasone (FLONASE) 50 MCG/ACT nasal spray USE TWO SPRAYS IN EACH NOSTRIL DAILY AS DIRECTED 16 g 5   folic acid (FOLVITE) 1 MG tablet Take 1 tablet (1 mg total) by mouth daily. (Patient taking differently: Take 1 mg by mouth in the morning and at bedtime.) 30 tablet 6   HYDROmorphone (DILAUDID) 4 MG tablet Take by mouth as needed for severe pain.     ketorolac (TORADOL) 30 MG/ML injection Inject 1 mL (30 mg total) into the muscle daily as needed for moderate pain. 5 mL 2   lamoTRIgine (LAMICTAL) 100 MG tablet TAKE ONE TABLET BY MOUTH AT BREAKFAST AND AT BEDTIME 180 tablet 1   lansoprazole (PREVACID) 30 MG capsule Take 1 capsule (30 mg total) by mouth 2 (two) times daily before a meal. 180 capsule 3   levocetirizine (XYZAL) 5 MG tablet Take 5 mg by mouth in the morning and at bedtime.     levothyroxine (SYNTHROID) 125 MCG tablet Take 1 tablet (125 mcg total) by mouth every morning. 90 tablet 1   LYSINE PO Take 1 tablet by mouth daily.     medroxyPROGESTERone (PROVERA) 2.5 MG tablet Take 1 tablet PO daily 90 tablet 1   methadone (DOLOPHINE) 5 MG tablet Take 1 tablet by mouth 4 (four) times daily as needed.     metoCLOPramide (REGLAN) 10 MG tablet Take 1 tablet (10 mg total) by mouth every 6 (six) hours as needed for nausea. 30 tablet 0   ondansetron (ZOFRAN) 8 MG tablet Take 1 tablet (8 mg total) by mouth every 8 (eight) hours as needed for nausea or vomiting. 60 tablet 5   promethazine (PHENERGAN) 25 MG tablet TAKE ONE TABLET BY MOUTH FOR 8 HOURS AS NEEDED 20 tablet 3   ranitidine (ZANTAC) 150 MG capsule Take 150 mg by mouth once a week.     sertraline (ZOLOFT) 100 MG tablet Take 2 tablets (200 mg total) by mouth every morning. 180 tablet 1   TH FAMOTIDINE 10 PO Take 10 mg by mouth once a week. 1-2 tabs once weekly 68mn-1hr prior to Benlysta injection to aid with  injection site rxn     tiZANidine (ZANAFLEX) 4 MG capsule Take 3 capsules (12 mg total) by mouth at bedtime as needed for muscle spasms. 90 capsule 1   topiramate (TOPAMAX) 25 MG tablet Take by mouth.     triamcinolone cream (KENALOG) 0.1 % daily as needed.     sucralfate (CARAFATE) 1 g tablet Take 1 tablet (1 g total) by mouth 3 (three) times daily as needed for up to 14 days. (Patient taking differently: Take 1 g by mouth as needed.) 56 tablet 0   No current facility-administered medications on file prior to visit.    Past Medical History:  Diagnosis Date   Allergy    Anemia    Anxiety    Anxiety and depression    Arrhythmia 2021   Arthritis    Asthma    Cervical cancer ([facility]    age 89   Chronic headaches    Colon polyp  COPD (chronic obstructive pulmonary disease) (HCC)    no o2   Depression    Dysrhythmia    Endometriosis    Fibromyalgia    Fundic gland polyps of stomach, benign    Gastritis    GERD (gastroesophageal reflux disease)    H. pylori infection 2013   H/O gastric ulcer 1985   History of anorexia nervosa    History of bulimia    History of pneumonia    Hypothyroidism    Irritable bowel syndrome (IBS)    Obesity    Personal history of other mental and behavioral disorders 01/14/2008   Qualifier: Diagnosis of  By: Carlean Purl MD, [name]   Overview:  Overview:  Annotation: BULIMIA Qualifier: Diagnosis of  By: Bertram Gala    PONV (postoperative nausea and vomiting)    iv phenergan helps   REFLEX SYMPATHETIC DYSTROPHY 02/26/2009   Qualifier: Diagnosis of  By: Carlean Purl MD, [name]    Rheumatoid arteritis (HCC)    RSD (reflex sympathetic dystrophy)    Sleep apnea    Systemic lupus ([place]) 2021   Ulcer    gastric, duodenal ulcer   Allergies  Allergen Reactions   Bee Pollen Anaphylaxis   Diazepam     sucidal depression   Prednisone Nausea And Vomiting    Per pt can not take oral steroids    Amoxicillin     Severe diarrhea At high  dose    Doxycycline     Pt does not remember reaction   Emetrol     Other reaction(s): GI upset   Erythromycin     n & v   Gabapentin     Doesn't remember; didn't tolerate   Imuran [Azathioprine]     Patient states this was discontinued due to liver problems   Methotrexate Derivatives     diarrhea   Morphine     n & v   Propoxyphene N-Acetaminophen     n & V   Sulfa Antibiotics     MIGRAINES   Tetracyclines & Related     N & v   Propoxyphene Other (See Comments)    Social History   Socioeconomic History   Marital status: Divorced    Spouse name: Not on file   Number of children: 0   Years of education: 16   Highest education level: Bachelor's degree (e.g., BA, AB, BS)  Occupational History   Occupation: Dance movement psychotherapist: UNEMPLOYED  Tobacco Use   Smoking status: Never   Smokeless tobacco: Never  Vaping Use   Vaping Use: Never used  Substance and Sexual Activity   Alcohol use: No   Drug use: No   Sexual activity: Not Currently    Birth control/protection: None  Other Topics Concern   Not on file  Social History Narrative   Lives at home alone.   Caffeine use: Drinks coffee- 1 cup in morning   Ginger ale   Social Determinants of Health   Financial Resource Strain: Medium Risk (11/10/2022)   Overall Financial Resource Strain (CARDIA)    Difficulty of Paying Living Expenses: Somewhat hard  Food Insecurity: No Food Insecurity (11/10/2022)   Hunger Vital Sign    Worried About Running Out of Food in the Last Year: Never true    Ran Out of Food in the Last Year: Never true  Transportation Needs: No Transportation Needs (11/10/2022)   PRAPARE - Hydrologist (Medical): No  Lack of Transportation (Non-Medical): No  Physical Activity: Unknown (11/10/2022)   Exercise Vital Sign    Days of Exercise per Week: 0 days    Minutes of Exercise per Session: Not on file  Stress: Stress Concern Present (11/10/2022)   [place]ut[place]    Feeling of Stress : To some extent  Social Connections: Moderately Integrated (11/10/2022)   Social Connection and Isolation Panel [NHANES]    Frequency of Communication with Friends and Family: More than three times a week    Frequency of Social Gatherings with Friends and Family: Patient refused    Attends Religious Services: More than 4 times per year    Active Member of [employer] or Organizations: Yes    Attends Music therapist: More than 4 times per year    Marital Status: Divorced   Today's Vitals   11/11/22 1223  BP: 128/80  Pulse: 90  Temp: 98.9 F (37.2 C)  TempSrc: Oral  SpO2: 95%  Weight: 176 lb (79.8 kg)  Height: 5' 4"$  (1.626 m)   Body mass index is 30.21 kg/m.  Physical Exam Vitals and nursing note reviewed.  Constitutional:      General: She is not in acute distress.    Appearance: She is well-developed.  HENT:     Head: Normocephalic and atraumatic.     Right Ear: Tympanic membrane, ear canal and external ear normal.     Left Ear: Tympanic membrane, ear canal and external ear normal.     Nose: Congestion and rhinorrhea present.     Right Sinus: No maxillary sinus tenderness or frontal sinus tenderness.     Left Sinus: No maxillary sinus tenderness or frontal sinus tenderness.     Mouth/Throat:     Mouth: Mucous membranes are moist.     Pharynx: Oropharynx is clear.  Eyes:     Conjunctiva/sclera: Conjunctivae normal.  Cardiovascular:     Rate and Rhythm: Normal rate and regular rhythm.     Pulses:          Dorsalis pedis pulses are 2+ on the right side and 2+ on the left side.     Heart sounds: No murmur heard. Pulmonary:     Effort: Pulmonary effort is normal. No respiratory distress.     Breath sounds: Normal breath sounds.  Abdominal:     Palpations: Abdomen is soft. There is no hepatomegaly or mass.     Tenderness: There is no abdominal tenderness.  Lymphadenopathy:      Cervical: No cervical adenopathy.  Skin:    General: Skin is warm.     Findings: No erythema or rash.  Neurological:     General: No focal deficit present.     Mental Status: She is alert and oriented to person, place, and time.     Cranial Nerves: No cranial nerve deficit.     Gait: Gait normal.  Psychiatric:        Mood and Affect: Mood is anxious. Mood is not depressed.   ASSESSMENT AND PLAN:  Ms. Pang was seen today for medical management of chronic issues.  Diagnoses and all orders for this visit: Lab Results  Component Value Date   TSH 0.63 11/11/2022   Hypothyroidism, unspecified type Assessment & Plan: Last TSH in 10/2021 normal at 2.5. Continue levothyroxine 125 mcg daily. Further recommendations according to TSH result.  Orders: -     TSH; Future -     T4, free;  Future  Depression, recurrent ([place]) Assessment & Plan: Problem has been stable. Continue sertraline 100 mg 2 tablets daily ,Buspar 15 mg 1/2 tab bid,and Lamictal 100 mg twice daily. Follow-up in 6 months, before if needed. If problem gets any worse, psychiatrist evaluation will be recommended.   Anxiety disorder, unspecified type Assessment & Plan: Stable. Continue Sertraline 200 mg and Buspar 15 mg 1/2 tab bid. F/U in 6 months.   SVT (supraventricular tachycardia) Assessment & Plan: Stable otherwise. Loop recorder showed PACs and PVCs but no arrhythmias that would cause syncope.  She has not tolerated Metoprolol in the past.  Orders: -     Basic metabolic panel; Future  Menopausal vasomotor syndrome Assessment & Plan: Problem has been well controlled with estradiol and progesterone.   She understands risks of treatment. She has not tolerated decreasing dose. Last mammogram on 12/01/2021.   Chronic cough Assessment & Plan: Lung auscultation today negative. Possible etiologies discussed, so of her comorbilities could be contributing factors (allergies,GERD). She has not had a CXR  since 2019,, CXR ordered. Plain Mucinex may help. We could consider adding a ICS/LABA.  Orders: -     DG Chest 2 View; Future -     CBC; Future  Seasonal allergies Assessment & Plan: Recommend stopping nasal Afrin. Nasal saline irrigations as needed. Stop Astelin and Flonase nasal spray. Try Atrovent nasal spray.  Orders: -     Ipratropium Bromide; Place 2 sprays into both nostrils 3 (three) times daily as needed for rhinitis.  Dispense: 15 mL; Refill: 1  Systemic lupus erythematosus, unspecified SLE type, unspecified organ involvement status ([place]) Assessment & Plan: Following with rheumatologist.   I spent a total of 41 minutes in both face to face and non face to face activities for this visit on the date of this encounter. During this time history was obtained and documented, examination was performed, prior labs/imaging reviewed, and assessment/plan discussed.  Return in about 6 months (around 05/12/2023).  Hancel Ion G. [name], MD  [facility]. [place] office.

## 2022-11-11 ENCOUNTER — Ambulatory Visit (INDEPENDENT_AMBULATORY_CARE_PROVIDER_SITE_OTHER)
Admission: RE | Admit: 2022-11-11 | Discharge: 2022-11-11 | Disposition: A | Discharge status: Home / Self Care | Payer: PPO | Source: Ambulatory Visit | Attending: Family Medicine | Admitting: Family Medicine

## 2022-11-11 ENCOUNTER — Ambulatory Visit (INDEPENDENT_AMBULATORY_CARE_PROVIDER_SITE_OTHER): Payer: PPO | Admitting: Family Medicine

## 2022-11-11 ENCOUNTER — Encounter: Payer: Self-pay | Admitting: Family Medicine

## 2022-11-11 VITALS — BP 128/80 | HR 90 | Temp 98.9°F | Ht 64.0 in | Wt 176.0 lb

## 2022-11-11 DIAGNOSIS — M329 Systemic lupus erythematosus, unspecified: Secondary | ICD-10-CM | POA: Diagnosis not present

## 2022-11-11 DIAGNOSIS — E039 Hypothyroidism, unspecified: Secondary | ICD-10-CM | POA: Diagnosis not present

## 2022-11-11 DIAGNOSIS — R053 Chronic cough: Secondary | ICD-10-CM

## 2022-11-11 DIAGNOSIS — F419 Anxiety disorder, unspecified: Secondary | ICD-10-CM

## 2022-11-11 DIAGNOSIS — J45909 Unspecified asthma, uncomplicated: Secondary | ICD-10-CM | POA: Diagnosis not present

## 2022-11-11 DIAGNOSIS — I471 Supraventricular tachycardia, unspecified: Secondary | ICD-10-CM | POA: Diagnosis not present

## 2022-11-11 DIAGNOSIS — F339 Major depressive disorder, recurrent, unspecified: Secondary | ICD-10-CM | POA: Diagnosis not present

## 2022-11-11 DIAGNOSIS — R059 Cough, unspecified: Secondary | ICD-10-CM | POA: Diagnosis not present

## 2022-11-11 DIAGNOSIS — J302 Other seasonal allergic rhinitis: Secondary | ICD-10-CM

## 2022-11-11 DIAGNOSIS — N951 Menopausal and female climacteric states: Secondary | ICD-10-CM

## 2022-11-11 DIAGNOSIS — R062 Wheezing: Secondary | ICD-10-CM | POA: Diagnosis not present

## 2022-11-11 DIAGNOSIS — M069 Rheumatoid arthritis, unspecified: Secondary | ICD-10-CM

## 2022-11-11 LAB — CBC
HCT: 41.5 % (ref 36.0–46.0)
Hemoglobin: 13.6 g/dL (ref 12.0–15.0)
MCHC: 32.9 g/dL (ref 30.0–36.0)
MCV: 90.8 fl (ref 78.0–100.0)
Platelets: 299 10*3/uL (ref 150.0–400.0)
RBC: 4.57 Mil/uL (ref 3.87–5.11)
RDW: 13.5 % (ref 11.5–15.5)
WBC: 6.3 10*3/uL (ref 4.0–10.5)

## 2022-11-11 LAB — T4, FREE: Free T4: 0.86 ng/dL (ref 0.60–1.60)

## 2022-11-11 LAB — TSH: TSH: 0.63 u[IU]/mL (ref 0.35–5.50)

## 2022-11-11 MED ORDER — IPRATROPIUM BROMIDE 0.06 % NA SOLN: 2.0000 | Freq: Three times a day (TID) | NASAL | 1 refills | Status: DC | PRN

## 2022-11-11 NOTE — Patient Instructions (Addendum)
A few things to remember from today's visit:  Hypothyroidism, unspecified type - Plan: TSH, T4, free  Depression, recurrent (HCC)  Anxiety disorder, unspecified type  Rheumatoid arthritis, involving unspecified site, unspecified whether rheumatoid factor present ([place]), Chronic  SVT (supraventricular tachycardia), Chronic - Plan: Basic metabolic panel  Menopausal vasomotor syndrome  Persistent cough - Plan: DG Chest 2 View, CBC  Seasonal allergies - Plan: ipratropium (ATROVENT) 0.06 % nasal spray  Nasal saline irrigations as needed. Stop Flonase and Astelin and try Atrovent nasal spray. No changes in rest.  If you need refills for medications you take chronically, please call your pharmacy. Do not use My Chart to request refills or for acute issues that need immediate attention. If you send a my chart message, it may take a few days to be addressed, specially if I am not in the office.  Please be sure medication list is accurate. If a new problem present, please set up appointment sooner than planned today.

## 2022-11-12 ENCOUNTER — Encounter: Payer: Self-pay | Admitting: Family Medicine

## 2022-11-12 LAB — BASIC METABOLIC PANEL
BUN: 14 mg/dL (ref 6–23)
CO2: 21 mEq/L (ref 19–32)
Calcium: 9.8 mg/dL (ref 8.4–10.5)
Chloride: 106 mEq/L (ref 96–112)
Creatinine, Ser: 1.01 mg/dL (ref 0.40–1.20)
GFR: 61.05 mL/min (ref >60.00)
Glucose, Bld: 94 mg/dL (ref 70–99)
Potassium: 4.6 mEq/L (ref 3.5–5.1)
Sodium: 139 mEq/L (ref 135–145)

## 2022-11-12 NOTE — Assessment & Plan Note (Signed)
Problem has been well controlled with estradiol and progesterone.   She understands risks of treatment. She has not tolerated decreasing dose. Last mammogram on 12/01/2021.

## 2022-11-12 NOTE — Assessment & Plan Note (Signed)
Lung auscultation today negative. Possible etiologies discussed, so of her comorbilities could be contributing factors (allergies,GERD). She has not had a CXR since 2019,, CXR ordered. Plain Mucinex may help. We could consider adding a ICS/LABA.

## 2022-11-12 NOTE — Assessment & Plan Note (Signed)
Problem has been stable. Continue sertraline 100 mg 2 tablets daily ,Buspar 15 mg 1/2 tab bid,and Lamictal 100 mg twice daily. Follow-up in 6 months, before if needed. If problem gets any worse, psychiatrist evaluation will be recommended.

## 2022-11-12 NOTE — Assessment & Plan Note (Signed)
Stable. Continue Sertraline 200 mg and Buspar 15 mg 1/2 tab bid. F/U in 6 months.

## 2022-11-12 NOTE — Assessment & Plan Note (Signed)
Last TSH in 10/2021 normal at 2.5. Continue levothyroxine 125 mcg daily. Further recommendations according to TSH result.

## 2022-11-12 NOTE — Assessment & Plan Note (Signed)
Following with rheumatologist.

## 2022-11-12 NOTE — Assessment & Plan Note (Addendum)
Recommend stopping nasal Afrin. Nasal saline irrigations as needed. Stop Astelin and Flonase nasal spray. Try Atrovent nasal spray.

## 2022-11-12 NOTE — Assessment & Plan Note (Addendum)
Stable otherwise. Loop recorder showed PACs and PVCs but no arrhythmias that would cause syncope.  She has not tolerated Metoprolol in the past.

## 2022-11-24 ENCOUNTER — Other Ambulatory Visit: Payer: Self-pay | Admitting: Family Medicine

## 2022-11-24 ENCOUNTER — Telehealth: Payer: Self-pay

## 2022-11-24 DIAGNOSIS — Z79891 Long term (current) use of opiate analgesic: Secondary | ICD-10-CM | POA: Diagnosis not present

## 2022-11-24 DIAGNOSIS — N951 Menopausal and female climacteric states: Secondary | ICD-10-CM

## 2022-11-24 DIAGNOSIS — G894 Chronic pain syndrome: Secondary | ICD-10-CM | POA: Diagnosis not present

## 2022-11-24 DIAGNOSIS — G90512 Complex regional pain syndrome I of left upper limb: Secondary | ICD-10-CM | POA: Diagnosis not present

## 2022-11-24 DIAGNOSIS — M542 Cervicalgia: Secondary | ICD-10-CM | POA: Diagnosis not present

## 2022-11-24 NOTE — Progress Notes (Signed)
Patient ID: Jo [name], female   DOB: 01-25-1961, 62 y.o.   MRN: [id number]  Care Management & Coordination Services Pharmacy Team  Reason for Encounter: Medication coordination and delivery  Contacted patient to discuss medications and coordinate delivery from [facility]. Spoke with patient on 11/24/2022  Cycle dispensing form sent to [facility] for review.   Last adherence delivery date:09/07/22       Patient is due for next adherence delivery on: 12/07/22  This delivery to include: Adherence Packaging  90 Days  Buspirone (Buspar) 15 mg : Take half tab at Breakfast and half at bedtime (7.5 mg) Lansoprazole ( Prevacid) 30 mg: Take one capsule breakfast and bedtime  Levothyroxine (Synthroid) 125 mg : Take one tab at Breakfast Sertraline (Zoloft) 100 mg : Take two tabs with Breakfast Lamotrigine (Lamictal) 100 mg: Take one tab at Breakfast and one at Bedtime Levocetirizine 5 mg: Take one tab at bedtime Estradiol (Estrace) 2 mg : Take one tab at Breakfast Tizanidine (Zanaflex) 4 mg : Take 3 tabs at bedtime Medroxyprogesterone (Provera) 2.5 mg : Take one tab daily at breakfast Toprimate (Topomax) [id number] one tab at Breakfast and 4 at Bedtime Folic Acid 1 mg: Take one tab at breakfast and one at bedtime (requested prescription be sent) L- Lysine 1000 mg: Take one tab at bedtime methadone 5 mg tablet 10/26/2022 30   Patient declined the following medications this month: BD Syringes needles 3ML (25g1") : 2 syringes per month for b 12 Vitamin B-12 injection : Inject 1 ml once a month Patient reports abundance as she hadn't taken for a while  Refills requested from providers include: tizanidine 4 mg tablet   topiramate 25 mg tablet  Dr Hardin Negus [phone]  levocetirizine 5 mg tablet  Lennie Odor [phone]  Confirmed delivery date of 12/07/22, advised patient that pharmacy will contact them the morning of delivery.   Any concerns about your medications? No  How  often do you forget or accidentally miss a dose? Rarely  Do you use a pillbox? No  Is patient in packaging Yes    Chart review: Recent office visits:  11/11/22 [name], [name], MD - Patient presented for Hypothyroidism unspecified type and other concerns. Prescribed Atrovent. Stopped Astelin.  Recent consult visits:  11/10/22 Jo [name] - Patient presented for 4 month follow up visit. No medication changes.   Hospital visits:  None in previous 6 months  Medications: Outpatient Encounter Medications as of 11/24/2022  Medication Sig Note   B-D 3CC LUER-LOK SYR 23GX1" 23G X 1" 3 ML MISC Inject 2 Needles into the skin See admin instructions.    Belimumab (BENLYSTA) 200 MG/ML SOSY once a week.    betamethasone dipropionate 0.05 % lotion Apply topically as needed.    busPIRone (BUSPAR) 15 MG tablet TAKE 1/2 TABLET BY MOUTH EVERY MORNING and TAKE 1/2 TABLET BY MOUTH EVERYDAY AT BEDTIME    cholecalciferol (VITAMIN D) 1000 UNITS tablet Take 2,000 Units by mouth daily.    Cranberry 200 MG CAPS Take 200 mg by mouth 2 (two) times daily.    cyanocobalamin (VITAMIN B12) 1000 MCG/ML injection Inject 1 mL (1,000 mcg total) into the muscle every 30 (thirty) days.    estradiol (ESTRACE) 2 MG tablet Take 1 tablet (2 mg total) by mouth daily.    fluticasone (CUTIVATE) 0.05 % cream Apply 1 application topically 2 (two) times daily as needed (rash).     fluticasone (FLONASE) 50 MCG/ACT nasal spray USE TWO SPRAYS IN [facility]  NOSTRIL DAILY AS DIRECTED    folic acid (FOLVITE) 1 MG tablet Take 1 tablet (1 mg total) by mouth daily. (Patient taking differently: Take 1 mg by mouth in the morning and at bedtime.)    HYDROmorphone (DILAUDID) 4 MG tablet Take by mouth as needed for severe pain.    ipratropium (ATROVENT) 0.06 % nasal spray Place 2 sprays into both nostrils 3 (three) times daily as needed for rhinitis.    ketorolac (TORADOL) 30 MG/ML injection Inject 1 mL (30 mg total) into the muscle daily as needed for  moderate pain.    lamoTRIgine (LAMICTAL) 100 MG tablet TAKE ONE TABLET BY MOUTH AT BREAKFAST AND AT BEDTIME    lansoprazole (PREVACID) 30 MG capsule Take 1 capsule (30 mg total) by mouth 2 (two) times daily before a meal.    levocetirizine (XYZAL) 5 MG tablet Take 5 mg by mouth in the morning and at bedtime.    levothyroxine (SYNTHROID) 125 MCG tablet Take 1 tablet (125 mcg total) by mouth every morning.    LYSINE PO Take 1 tablet by mouth daily.    medroxyPROGESTERone (PROVERA) 2.5 MG tablet TAKE ONE TABLET BY MOUTH EVERY MORNING    methadone (DOLOPHINE) 5 MG tablet Take 1 tablet by mouth 4 (four) times daily as needed.    metoCLOPramide (REGLAN) 10 MG tablet Take 1 tablet (10 mg total) by mouth every 6 (six) hours as needed for nausea.    ondansetron (ZOFRAN) 8 MG tablet Take 1 tablet (8 mg total) by mouth every 8 (eight) hours as needed for nausea or vomiting.    promethazine (PHENERGAN) 25 MG tablet TAKE ONE TABLET BY MOUTH FOR 8 HOURS AS NEEDED    ranitidine (ZANTAC) 150 MG capsule Take 150 mg by mouth once a week. 10/28/2022: Switched to famotidine otc   sertraline (ZOLOFT) 100 MG tablet Take 2 tablets (200 mg total) by mouth every morning.    sucralfate (CARAFATE) 1 [name] tablet Take 1 tablet (1 [name] total) by mouth 3 (three) times daily as needed for up to 14 days. (Patient taking differently: Take 1 [name] by mouth as needed.)    TH FAMOTIDINE 10 PO Take 10 mg by mouth once a week. 1-2 tabs once weekly 41mn-1hr prior to Benlysta injection to aid with injection site rxn    tiZANidine (ZANAFLEX) 4 MG capsule Take 3 capsules (12 mg total) by mouth at bedtime as needed for muscle spasms.    topiramate (TOPAMAX) 25 MG tablet Take by mouth.    triamcinolone cream (KENALOG) 0.1 % daily as needed.    No facility-administered encounter medications on file as of 11/24/2022.   BP Readings from Last 3 Encounters:  11/11/22 128/80  10/14/22 110/74  05/12/22 122/70    Pulse Readings from Last 3 Encounters:   11/11/22 90  10/14/22 64  05/12/22 74    Lab Results  Component Value Date/Time   HGBA1C 5.6 11/15/2019 09:49 AM   Lab Results  Component Value Date   CREATININE 1.01 11/11/2022   BUN 14 11/11/2022   GFR 61.05 11/11/2022   GFRNONAA 60 (L) 04/28/2021   GFRAA >60 05/15/2020   NA 139 11/11/2022   K 4.6 11/11/2022   CALCIUM 9.8 11/11/2022   CO2 21 11/11/2022       LShoshoneClinical Pharmacist Assistant [phone]

## 2022-11-25 ENCOUNTER — Ambulatory Visit: Payer: PPO | Admitting: Cardiovascular Disease

## 2022-11-25 DIAGNOSIS — H5712 Ocular pain, left eye: Secondary | ICD-10-CM | POA: Diagnosis not present

## 2022-11-25 DIAGNOSIS — H04123 Dry eye syndrome of bilateral lacrimal glands: Secondary | ICD-10-CM | POA: Diagnosis not present

## 2022-11-25 DIAGNOSIS — H2513 Age-related nuclear cataract, bilateral: Secondary | ICD-10-CM | POA: Diagnosis not present

## 2022-11-30 ENCOUNTER — Ambulatory Visit: Payer: PPO

## 2022-11-30 DIAGNOSIS — R55 Syncope and collapse: Secondary | ICD-10-CM

## 2022-12-01 LAB — CUP PACEART REMOTE DEVICE CHECK
Date Time Interrogation Session: 20240310230128
Implantable Pulse Generator Implant Date: 20221006

## 2022-12-09 NOTE — Progress Notes (Signed)
Carelink Summary Report / Loop Recorder 

## 2022-12-11 NOTE — Progress Notes (Signed)
ACUTE VISIT Chief Complaint  Patient presents with   soreness on left side    X about a month, sore & burning, goes up side of body to back.    HPI: [name] is a 62 y.o. female with past medical history significant for seasonal allergies, SVT, SLE, reflex sympathetic dystrophy/complex regional pain syndrome I, chronic pain, GERD, migraine headaches, fibromyalgia, depression, and anxiety  here today complaining of significant soreness and burning like sensation on the left side, extending from area under left breast to lateral thoracic area to upper back and infraclavicular area. Pain is constant and exacerbated by movement and deep breathing. She reports this as a new problem that started 2 days after her last visit, 11/11/22. She has not noted skin rash.  She has had chest pain before attributed to pleurisy but different to this pain. She also experiences nausea, a lack of appetite, and weight gain despite reduced food intake. She denies any recent unusual physical activities ,trauma,or changes in medication, except for the introduction of probiotics. Lab Results  Component Value Date   TSH 0.63 11/11/2022   Lab Results  Component Value Date   VITAMINB12 498 10/23/2021   Lab Results  Component Value Date   WBC 6.3 11/11/2022   HGB 13.6 11/11/2022   HCT 41.5 11/11/2022   MCV 90.8 11/11/2022   PLT 299.0 11/11/2022   She also mentions breast tenderness but no more than her usual soreness. She is due for mammogram but does not want to do it until this new pain improves.  She also reports a decrease in sensation to temperature changes on her back, particularly heat.   She also reports cough and nausea, both are chronic problems.  Cough is sometimes strong , has not identified exacerbating factors. She saw pulmonologist years ago and was dx'ed with cough variant asthma, inhalers did not help. States that she took a medication that was the only once that helps, she does not  remember name. Cough has been going on for about 20 years, no associated wheezing or SOB.  Sometimes productive cough with yellowish sputum but no hemoptysis. Nasal congestion, rhinorrhea,and post nasal drainage. She has been on Flonase and Astelin. Last visit, I recommended Atrovent nasal spray, she did not receive Rx.  CXR 11/13/22 negative for active cardiopulmonary disease.  GERD on Prevacid 30 mg daily and Zantac 150 mg daily.. She has not noted heartburn. Evaluated by Dr Leone Payor for nausea and heartburn 05/2021. She is on Phenergan,Metoclopramide, and Zofran.  She follows with pain management for chronic regional pain synd. She also follows with rheumatologist for lupus, RA,and fibromyalgia. Cardiologist for palpitations and hx of syncope.  Review of Systems  Constitutional:  Positive for appetite change and fatigue. Negative for chills and fever.  Cardiovascular:  Negative for leg swelling.  Gastrointestinal:  Negative for abdominal pain.       No changes in bowel habits.  Genitourinary:  Negative for decreased urine volume, dysuria and hematuria.  Musculoskeletal:  Positive for arthralgias and myalgias. Negative for gait problem.  Allergic/Immunologic: Positive for environmental allergies.  Psychiatric/Behavioral:  Negative for confusion and hallucinations. The patient is nervous/anxious.   See other pertinent positives and negatives in HPI.  Current Outpatient Medications on File Prior to Visit  Medication Sig Dispense Refill   B-D 3CC LUER-LOK SYR 23GX1" 23G X 1" 3 ML MISC Inject 2 Needles into the skin See admin instructions. 6 each 3   Belimumab (BENLYSTA) 200 MG/ML SOSY once a  week.     betamethasone dipropionate 0.05 % lotion Apply topically as needed.     busPIRone (BUSPAR) 15 MG tablet TAKE 1/2 TABLET BY MOUTH EVERY MORNING and TAKE 1/2 TABLET BY MOUTH EVERYDAY AT BEDTIME 90 tablet 1   cholecalciferol (VITAMIN D) 1000 UNITS tablet Take 2,000 Units by mouth daily.      Cranberry 200 MG CAPS Take 200 mg by mouth 2 (two) times daily.     cyanocobalamin (VITAMIN B12) 1000 MCG/ML injection Inject 1 mL (1,000 mcg total) into the muscle every 30 (thirty) days. 3 mL 1   estradiol (ESTRACE) 2 MG tablet Take 1 tablet (2 mg total) by mouth daily. 90 tablet 2   fluticasone (CUTIVATE) 0.05 % cream Apply 1 application topically 2 (two) times daily as needed (rash).      folic acid (FOLVITE) 1 MG tablet Take 2 mg by mouth daily.     HYDROmorphone (DILAUDID) 4 MG tablet Take by mouth as needed for severe pain.     ipratropium (ATROVENT) 0.06 % nasal spray Place 2 sprays into both nostrils 3 (three) times daily as needed for rhinitis. 15 mL 1   ketorolac (TORADOL) 30 MG/ML injection Inject 1 mL (30 mg total) into the muscle daily as needed for moderate pain. 5 mL 2   lamoTRIgine (LAMICTAL) 100 MG tablet TAKE ONE TABLET BY MOUTH AT BREAKFAST AND AT BEDTIME 180 tablet 1   lansoprazole (PREVACID) 30 MG capsule Take 1 capsule (30 mg total) by mouth 2 (two) times daily before a meal. 180 capsule 3   levocetirizine (XYZAL) 5 MG tablet Take 5 mg by mouth in the morning and at bedtime.     levothyroxine (SYNTHROID) 125 MCG tablet Take 1 tablet (125 mcg total) by mouth every morning. 90 tablet 1   LYSINE PO Take 1 tablet by mouth daily.     medroxyPROGESTERone (PROVERA) 2.5 MG tablet TAKE ONE TABLET BY MOUTH EVERY MORNING 90 tablet 2   methadone (DOLOPHINE) 5 MG tablet Take 1 tablet by mouth 4 (four) times daily as needed.     metoCLOPramide (REGLAN) 10 MG tablet Take 1 tablet (10 mg total) by mouth every 6 (six) hours as needed for nausea. 30 tablet 0   ondansetron (ZOFRAN) 8 MG tablet Take 1 tablet (8 mg total) by mouth every 8 (eight) hours as needed for nausea or vomiting. 60 tablet 5   promethazine (PHENERGAN) 25 MG tablet TAKE ONE TABLET BY MOUTH FOR 8 HOURS AS NEEDED 20 tablet 3   ranitidine (ZANTAC) 150 MG capsule Take 150 mg by mouth once a week.     sertraline (ZOLOFT) 100 MG  tablet Take 2 tablets (200 mg total) by mouth every morning. 180 tablet 1   TH FAMOTIDINE 10 PO Take 10 mg by mouth once a week. 1-2 tabs once weekly 41min-1hr prior to Benlysta injection to aid with injection site rxn     tiZANidine (ZANAFLEX) 4 MG capsule Take 3 capsules (12 mg total) by mouth at bedtime as needed for muscle spasms. 90 capsule 1   topiramate (TOPAMAX) 25 MG tablet 4 tabs a bedtime and 1 tab in the morning.     triamcinolone cream (KENALOG) 0.1 % daily as needed.     sucralfate (CARAFATE) 1 g tablet Take 1 tablet (1 g total) by mouth 3 (three) times daily as needed for up to 14 days. (Patient taking differently: Take 1 g by mouth as needed.) 56 tablet 0   No current facility-administered  medications on file prior to visit.   Past Medical History:  Diagnosis Date   Allergy    Anemia    Anxiety    Anxiety and depression    Arrhythmia 2021   Arthritis    Asthma    Cervical cancer [facility])    age 28    Chronic headaches    Colon polyp    COPD (chronic obstructive pulmonary disease) (HCC)    no o2   Depression    Dysrhythmia    Endometriosis    Fibromyalgia    Fundic gland polyps of stomach, benign    Gastritis    GERD (gastroesophageal reflux disease)    H. pylori infection 2013   H/O gastric ulcer 1985   History of anorexia nervosa    History of bulimia    History of pneumonia    Hypothyroidism    Irritable bowel syndrome (IBS)    Obesity    Personal history of other mental and behavioral disorders 01/14/2008   Qualifier: Diagnosis of  By: Leone Payor MD, Charlyne Quale   Overview:  Overview:  Annotation: BULIMIA Qualifier: Diagnosis of  By: Davonna Belling    PONV (postoperative nausea and vomiting)    iv phenergan helps   REFLEX SYMPATHETIC DYSTROPHY 02/26/2009   Qualifier: Diagnosis of  By: Leone Payor MD, Charlyne Quale    Rheumatoid arteritis (HCC)    RSD (reflex sympathetic dystrophy)    Sleep apnea    Systemic lupus (HCC) 2021   Ulcer    gastric,  duodenal ulcer   Allergies  Allergen Reactions   Bee Pollen Anaphylaxis   Diazepam     sucidal depression   Prednisone Nausea And Vomiting    Per pt can not take oral steroids    Amoxicillin     Severe diarrhea At high dose    Doxycycline     Pt does not remember reaction   Emetrol     Other reaction(s): GI upset   Erythromycin     n & v   Gabapentin     Doesn't remember; didn't tolerate   Imuran [Azathioprine]     Patient states this was discontinued due to liver problems   Methotrexate Derivatives     diarrhea   Morphine     n & v   Propoxyphene N-Acetaminophen     n & V   Sulfa Antibiotics     MIGRAINES   Tetracyclines & Related     N & v   Propoxyphene Other (See Comments)    Social History   Socioeconomic History   Marital status: Divorced    Spouse name: Not on file   Number of children: 0   Years of education: 16   Highest education level: Bachelor's degree (e.g., BA, AB, BS)  Occupational History   Occupation: Public house manager: UNEMPLOYED  Tobacco Use   Smoking status: Never   Smokeless tobacco: Never  Vaping Use   Vaping Use: Never used  Substance and Sexual Activity   Alcohol use: No   Drug use: No   Sexual activity: Not Currently    Birth control/protection: None  Other Topics Concern   Not on file  Social History Narrative   Lives at home alone.   Caffeine use: Drinks coffee- 1 cup in morning   Ginger ale   Social Determinants of Health   Financial Resource Strain: Medium Risk (11/10/2022)   Overall Financial Resource Strain (CARDIA)    Difficulty  of Paying Living Expenses: Somewhat hard  Food Insecurity: No Food Insecurity (11/10/2022)   Hunger Vital Sign    Worried About Running Out of Food in the Last Year: Never true    Ran Out of Food in the Last Year: Never true  Transportation Needs: No Transportation Needs (11/10/2022)   PRAPARE - Administrator, Civil Service (Medical): No    Lack of  Transportation (Non-Medical): No  Physical Activity: Unknown (11/10/2022)   Exercise Vital Sign    Days of Exercise per Week: 0 days    Minutes of Exercise per Session: Not on file  Stress: Stress Concern Present (11/10/2022)   [employer] of Occupational Health - Occupational Stress Questionnaire    Feeling of Stress : To some extent  Social Connections: Moderately Integrated (11/10/2022)   Social Connection and Isolation Panel [NHANES]    Frequency of Communication with Friends and Family: More than three times a week    Frequency of Social Gatherings with Friends and Family: Patient declined    Attends Religious Services: More than 4 times per year    Active Member of Clubs or Organizations: Yes    Attends Banker Meetings: More than 4 times per year    Marital Status: Divorced   Vitals:   12/14/22 1219  BP: 120/80  Pulse: 70  Resp: 16  Temp: 98.6 F (37 C)  SpO2: 97%   Wt Readings from Last 3 Encounters:  12/14/22 177 lb (80.3 kg)  11/11/22 176 lb (79.8 kg)  10/14/22 175 lb (79.4 kg)   Body mass index is 30.38 kg/m.  Physical Exam Vitals and nursing note reviewed.  Constitutional:      General: She is not in acute distress.    Appearance: She is well-developed.  HENT:     Head: Normocephalic and atraumatic.     Mouth/Throat:     Mouth: Mucous membranes are moist.     Pharynx: Oropharynx is clear.  Eyes:     Conjunctiva/sclera: Conjunctivae normal.  Cardiovascular:     Rate and Rhythm: Normal rate and regular rhythm.     Pulses:          Dorsalis pedis pulses are 2+ on the right side and 2+ on the left side.     Heart sounds: No murmur heard. Pulmonary:     Effort: Pulmonary effort is normal. No respiratory distress.     Breath sounds: Normal breath sounds.  Chest:     Chest wall: Tenderness present. No deformity, crepitus or edema.  Breasts:    Left: Tenderness present. No inverted nipple, mass, nipple discharge or skin change.     Abdominal:     Palpations: Abdomen is soft. There is no hepatomegaly or mass.     Tenderness: There is no abdominal tenderness.  Musculoskeletal:     Right shoulder: Normal range of motion.     Left shoulder: Normal range of motion.     Cervical back: Tenderness present.     Thoracic back: Tenderness present.       Back:  Lymphadenopathy:     Cervical: No cervical adenopathy.     Upper Body:     Left upper body: No axillary adenopathy.  Skin:    General: Skin is warm.     Findings: No erythema or rash.  Neurological:     General: No focal deficit present.     Mental Status: She is alert and oriented to person, place, and time.  Cranial Nerves: No cranial nerve deficit.     Gait: Gait normal.  Psychiatric:        Mood and Affect: Affect normal. Mood is anxious.   ASSESSMENT AND PLAN: Ms. Hanneken was seen today for new onset of thoracic pain and other concerns.  Costal margin pain This is the area when pain originated, extending to interscapular left area and infrascapular. No hx of trauma, very tender with light touch around left rib cage. Rib X ray ordered to evaluate for rib fractures. Topical Lidocaine may help, she agrees with trying. She also has Voltaren gel at home, which she can continue qid prn. We discussed possible etiologies. Some of her chronic health problems can be etiologic factors. Monitor for new symptoms , including rash. Instructed about warning signs.  -     DG Ribs Unilateral W/Chest Left; Future -     Lidocaine; Apply 1 Application topically 3 (three) times daily as needed.  Dispense: 85 g; Refill: 1  Chronic cough Assessment & Plan: We discussed possible etiologies. Some of her chronic co-morbilities could be contributing factors. She has seen pulmonologist years ago, we discussed the possibility of arranging a new evaluation but did not make a decision today. She does not recall cough suppressor she took before, explained that given her  extensive med list that includes Methadone and Hydromorphone I am not recommending one at this time.  Fibromyalgia Assessment & Plan: This problem can be contributing to symptoms she reported today. States that her rheumatologist told her it was not related. Continue adequate sleep hygiene,low impact exercise,and Zanaflex. Follows with rheumatologist.  Nausea without vomiting Assessment & Plan: Chronic. Currently on Zofran,Metoclopramide, and phenergan. Follows with GI.  Dysesthesia Reports decreased sensation in temp changes on her back. In general sensation was normal, I did not evaluate for cold and heat.  Recommend monitoring for new symptoms. We could consider neuro evaluation.  Rheumatoid arthritis, involving unspecified site, unspecified whether rheumatoid factor present [facility]) Assessment & Plan: Follows with rheumatologist.  I spent a total of 43 minutes in both face to face and non face to face activities for this visit on the date of this encounter. During this time history was obtained and documented, examination was performed, prior labs/imaging reviewed, and assessment/plan discussed.  Return if symptoms worsen or fail to improve, for keep next appointment.  Laurence Folz G. [place], MD  [facility]. [place] office.

## 2022-12-14 ENCOUNTER — Ambulatory Visit (INDEPENDENT_AMBULATORY_CARE_PROVIDER_SITE_OTHER): Payer: PPO | Admitting: Family Medicine

## 2022-12-14 ENCOUNTER — Encounter: Payer: Self-pay | Admitting: Family Medicine

## 2022-12-14 ENCOUNTER — Ambulatory Visit (INDEPENDENT_AMBULATORY_CARE_PROVIDER_SITE_OTHER): Payer: PPO

## 2022-12-14 VITALS — BP 120/80 | HR 70 | Temp 98.6°F | Resp 16 | Ht 64.0 in | Wt 177.0 lb

## 2022-12-14 DIAGNOSIS — R0781 Pleurodynia: Secondary | ICD-10-CM

## 2022-12-14 DIAGNOSIS — M069 Rheumatoid arthritis, unspecified: Secondary | ICD-10-CM | POA: Diagnosis not present

## 2022-12-14 DIAGNOSIS — R053 Chronic cough: Secondary | ICD-10-CM

## 2022-12-14 DIAGNOSIS — R208 Other disturbances of skin sensation: Secondary | ICD-10-CM

## 2022-12-14 DIAGNOSIS — M797 Fibromyalgia: Secondary | ICD-10-CM | POA: Diagnosis not present

## 2022-12-14 DIAGNOSIS — R059 Cough, unspecified: Secondary | ICD-10-CM | POA: Diagnosis not present

## 2022-12-14 DIAGNOSIS — R110 Nausea: Secondary | ICD-10-CM | POA: Insufficient documentation

## 2022-12-14 MED ORDER — LIDOCAINE 3 % EX CREA: 1.0000 | TOPICAL_CREAM | Freq: Three times a day (TID) | CUTANEOUS | 1 refills | Status: DC | PRN

## 2022-12-14 NOTE — Assessment & Plan Note (Signed)
Follows with rheumatologist.

## 2022-12-14 NOTE — Assessment & Plan Note (Addendum)
This problem can be contributing to symptoms she reported today. States that her rheumatologist told her it was not related. Continue adequate sleep hygiene,low impact exercise,and Zanaflex. Follows with rheumatologist.

## 2022-12-14 NOTE — Assessment & Plan Note (Signed)
Chronic. Currently on Zofran,Metoclopramide, and phenergan. Follows with GI.

## 2022-12-14 NOTE — Assessment & Plan Note (Signed)
We discussed possible etiologies. Some of her chronic co-morbilities could be contributing factors. She has seen pulmonologist years ago, we discussed the possibility of arranging a new evaluation but did not make a decision today. She does not recall cough suppressor she took before, explained that given her extensive med list that includes Methadone and Hydromorphone I am not recommending one at this time.

## 2022-12-14 NOTE — Patient Instructions (Addendum)
A few things to remember from today's visit:  Costal margin pain - Plan: DG Ribs Unilateral W/Chest Left, Lidocaine 3 % CREA  Chronic cough  Pain seems to be musculoskeletal. Try topical lidocaine.  Continue monitoring for rash. X ray today to evaluate for rib fracture.  If you need refills for medications you take chronically, please call your pharmacy. Do not use My Chart to request refills or for acute issues that need immediate attention. If you send a my chart message, it may take a few days to be addressed, specially if I am not in the office.  Please be sure medication list is accurate. If a new problem present, please set up appointment sooner than planned today.

## 2022-12-16 ENCOUNTER — Encounter: Payer: Self-pay | Admitting: Family Medicine

## 2022-12-16 ENCOUNTER — Telehealth: Payer: Self-pay | Admitting: Family Medicine

## 2022-12-16 NOTE — Telephone Encounter (Signed)
Pt is calling and would like xray results from [facility]

## 2022-12-16 NOTE — Telephone Encounter (Signed)
X-ray hasn't been read by radiology yet.

## 2022-12-17 ENCOUNTER — Encounter: Payer: Self-pay | Admitting: Family Medicine

## 2022-12-22 DIAGNOSIS — Z79891 Long term (current) use of opiate analgesic: Secondary | ICD-10-CM | POA: Diagnosis not present

## 2022-12-22 DIAGNOSIS — G894 Chronic pain syndrome: Secondary | ICD-10-CM | POA: Diagnosis not present

## 2022-12-22 DIAGNOSIS — G90512 Complex regional pain syndrome I of left upper limb: Secondary | ICD-10-CM | POA: Diagnosis not present

## 2022-12-22 DIAGNOSIS — M542 Cervicalgia: Secondary | ICD-10-CM | POA: Diagnosis not present

## 2023-01-04 ENCOUNTER — Ambulatory Visit (INDEPENDENT_AMBULATORY_CARE_PROVIDER_SITE_OTHER): Payer: PPO

## 2023-01-04 DIAGNOSIS — R55 Syncope and collapse: Secondary | ICD-10-CM

## 2023-01-04 LAB — CUP PACEART REMOTE DEVICE CHECK
Date Time Interrogation Session: 20240414230304
Implantable Pulse Generator Implant Date: 20221006

## 2023-01-06 NOTE — Progress Notes (Signed)
Carelink Summary Report / Loop Recorder 

## 2023-01-21 DIAGNOSIS — M542 Cervicalgia: Secondary | ICD-10-CM | POA: Diagnosis not present

## 2023-01-21 DIAGNOSIS — Z79891 Long term (current) use of opiate analgesic: Secondary | ICD-10-CM | POA: Diagnosis not present

## 2023-01-21 DIAGNOSIS — G894 Chronic pain syndrome: Secondary | ICD-10-CM | POA: Diagnosis not present

## 2023-01-21 DIAGNOSIS — G90512 Complex regional pain syndrome I of left upper limb: Secondary | ICD-10-CM | POA: Diagnosis not present

## 2023-02-08 ENCOUNTER — Ambulatory Visit (INDEPENDENT_AMBULATORY_CARE_PROVIDER_SITE_OTHER): Payer: PPO

## 2023-02-08 DIAGNOSIS — R55 Syncope and collapse: Secondary | ICD-10-CM

## 2023-02-08 LAB — CUP PACEART REMOTE DEVICE CHECK
Date Time Interrogation Session: 20240517230412
Implantable Pulse Generator Implant Date: 20221006

## 2023-02-08 NOTE — Progress Notes (Signed)
Carelink Summary Report / Loop Recorder 

## 2023-02-23 ENCOUNTER — Telehealth: Payer: Self-pay

## 2023-02-23 DIAGNOSIS — G90512 Complex regional pain syndrome I of left upper limb: Secondary | ICD-10-CM | POA: Diagnosis not present

## 2023-02-23 DIAGNOSIS — M542 Cervicalgia: Secondary | ICD-10-CM | POA: Diagnosis not present

## 2023-02-23 DIAGNOSIS — G894 Chronic pain syndrome: Secondary | ICD-10-CM | POA: Diagnosis not present

## 2023-02-23 DIAGNOSIS — Z79891 Long term (current) use of opiate analgesic: Secondary | ICD-10-CM | POA: Diagnosis not present

## 2023-02-23 NOTE — Progress Notes (Signed)
Patient ID: Jo [name], female   DOB: August 30, 1961, 62 y.o.   MRN: [id number]   Care Management & Coordination Services Pharmacy Team  Reason for Encounter: Medication coordination and delivery  Contacted patient to discuss medications and coordinate delivery from [facility]. {US HC Outreach:28874} Cycle dispensing form sent to *** for review.   Last adherence delivery date:12/07/22  Patient is due for next adherence delivery on: 03/05/23  This delivery to include: Adherence Packaging  90 Days  ***  Patient declined the following medications this month: Buspirone (Buspar) 15 mg : Take half tab at Breakfast and half at bedtime (7.5 mg) Lansoprazole ( Prevacid) 30 mg: Take one capsule breakfast and bedtime  Levothyroxine (Synthroid) 125 mg : Take one tab at Breakfast Sertraline (Zoloft) 100 mg : Take two tabs with Breakfast Lamotrigine (Lamictal) 100 mg: Take one tab at Breakfast and one at Bedtime Levocetirizine 5 mg: Take one tab at breakfast and one at bedtime Estradiol (Estrace) 2 mg : Take one tab at Breakfast Tizanidine (Zanaflex) 4 mg : Take 3 tabs at bedtime Medroxyprogesterone (Provera) 2.5 mg : Take one tab daily at breakfast Toprimate (Topomax) 25 WU:[id number] one tab at Breakfast and 4 at Bedtime Folic Acid 1 mg: Take one tab at breakfast and one at bedtime (requested prescription be sent) L- Lysine 1000 mg: Take one tab at bedtime  BENLYSTA 200MG /ML PF SYR 4X1ML 02/22/2023 28   hydromorphone 4 mg tablet 01/22/2023 20   methadone 5 mg tablet 01/22/2023 30   ondansetron HCl 4 mg tablet 01/22/2023 15   {refills needed:25320}  {Delivery date:25786}   Any concerns about your medications? {yes/no:20286}  How often do you forget or accidentally miss a dose? {Missed doses:25554}  Do you use a pillbox? {yes/no:20286}  Is patient in packaging {yes/no:20286}  If yes  What is the date on your next pill pack?  Any concerns or issues with your packaging?   Recent  blood pressure readings are as follows:***  Recent blood glucose readings are as follows:***   Chart review: Recent office visits:  12/14/22 [place], [name], MD - Patient presented for Costal margin pain and other concerns. Prescribed Lidocaine. Increased Folic acid.  Recent consult visits:  None  Hospital visits:  None in previous 6 months  Medications: Outpatient Encounter Medications as of 02/23/2023  Medication Sig Note   B-D 3CC LUER-LOK SYR 23GX1" 23G X 1" 3 ML MISC Inject 2 Needles into the skin See admin instructions.    Belimumab (BENLYSTA) 200 MG/ML SOSY once a week.    betamethasone dipropionate 0.05 % lotion Apply topically as needed.    busPIRone (BUSPAR) 15 MG tablet TAKE 1/2 TABLET BY MOUTH EVERY MORNING and TAKE 1/2 TABLET BY MOUTH EVERYDAY AT BEDTIME    cholecalciferol (VITAMIN D) 1000 UNITS tablet Take 2,000 Units by mouth daily.    Cranberry 200 MG CAPS Take 200 mg by mouth 2 (two) times daily.    cyanocobalamin (VITAMIN B12) 1000 MCG/ML injection Inject 1 mL (1,000 mcg total) into the muscle every 30 (thirty) days.    estradiol (ESTRACE) 2 MG tablet Take 1 tablet (2 mg total) by mouth daily.    fluticasone (CUTIVATE) 0.05 % cream Apply 1 application topically 2 (two) times daily as needed (rash).     folic acid (FOLVITE) 1 MG tablet Take 2 mg by mouth daily.    HYDROmorphone (DILAUDID) 4 MG tablet Take by mouth as needed for severe pain.    ipratropium (ATROVENT) 0.06 % nasal  spray Place 2 sprays into both nostrils 3 (three) times daily as needed for rhinitis.    ketorolac (TORADOL) 30 MG/ML injection Inject 1 mL (30 mg total) into the muscle daily as needed for moderate pain.    lamoTRIgine (LAMICTAL) 100 MG tablet TAKE ONE TABLET BY MOUTH AT BREAKFAST AND AT BEDTIME    lansoprazole (PREVACID) 30 MG capsule Take 1 capsule (30 mg total) by mouth 2 (two) times daily before a meal.    levocetirizine (XYZAL) 5 MG tablet Take 5 mg by mouth in the morning and at bedtime.     levothyroxine (SYNTHROID) 125 MCG tablet Take 1 tablet (125 mcg total) by mouth every morning.    Lidocaine 3 % CREA Apply 1 Application topically 3 (three) times daily as needed.    LYSINE PO Take 1 tablet by mouth daily.    medroxyPROGESTERone (PROVERA) 2.5 MG tablet TAKE ONE TABLET BY MOUTH EVERY MORNING    methadone (DOLOPHINE) 5 MG tablet Take 1 tablet by mouth 4 (four) times daily as needed.    metoCLOPramide (REGLAN) 10 MG tablet Take 1 tablet (10 mg total) by mouth every 6 (six) hours as needed for nausea.    ondansetron (ZOFRAN) 8 MG tablet Take 1 tablet (8 mg total) by mouth every 8 (eight) hours as needed for nausea or vomiting.    promethazine (PHENERGAN) 25 MG tablet TAKE ONE TABLET BY MOUTH FOR 8 HOURS AS NEEDED    ranitidine (ZANTAC) 150 MG capsule Take 150 mg by mouth once a week. 10/28/2022: Switched to famotidine otc   sertraline (ZOLOFT) 100 MG tablet Take 2 tablets (200 mg total) by mouth every morning.    sucralfate (CARAFATE) 1 g tablet Take 1 tablet (1 g total) by mouth 3 (three) times daily as needed for up to 14 days. (Patient taking differently: Take 1 g by mouth as needed.)    TH FAMOTIDINE 10 PO Take 10 mg by mouth once a week. 1-2 tabs once weekly 70min-1hr prior to Benlysta injection to aid with injection site rxn    tiZANidine (ZANAFLEX) 4 MG capsule Take 3 capsules (12 mg total) by mouth at bedtime as needed for muscle spasms.    topiramate (TOPAMAX) 25 MG tablet 4 tabs a bedtime and 1 tab in the morning.    triamcinolone cream (KENALOG) 0.1 % daily as needed.    No facility-administered encounter medications on file as of 02/23/2023.   BP Readings from Last 3 Encounters:  12/14/22 120/80  11/11/22 128/80  10/14/22 110/74    Pulse Readings from Last 3 Encounters:  12/14/22 70  11/11/22 90  10/14/22 64    Lab Results  Component Value Date/Time   HGBA1C 5.6 11/15/2019 09:49 AM   Lab Results  Component Value Date   CREATININE 1.01 11/11/2022   BUN 14  11/11/2022   GFR 61.05 11/11/2022   GFRNONAA 60 (L) 04/28/2021   GFRAA >60 05/15/2020   NA 139 11/11/2022   K 4.6 11/11/2022   CALCIUM 9.8 11/11/2022   CO2 21 11/11/2022        Pamala Duffel CMA Clinical Pharmacist Assistant [phone]

## 2023-03-01 ENCOUNTER — Other Ambulatory Visit: Payer: Self-pay | Admitting: Family Medicine

## 2023-03-01 ENCOUNTER — Telehealth: Payer: Self-pay

## 2023-03-01 DIAGNOSIS — N951 Menopausal and female climacteric states: Secondary | ICD-10-CM

## 2023-03-01 DIAGNOSIS — F341 Dysthymic disorder: Secondary | ICD-10-CM

## 2023-03-01 DIAGNOSIS — E039 Hypothyroidism, unspecified: Secondary | ICD-10-CM

## 2023-03-01 MED ORDER — BUSPIRONE HCL 15 MG PO TABS: ORAL_TABLET | ORAL | 2 refills | Status: DC

## 2023-03-01 MED ORDER — SERTRALINE HCL 100 MG PO TABS: 200.0000 mg | ORAL_TABLET | Freq: Every morning | ORAL | 2 refills | Status: DC

## 2023-03-01 MED ORDER — ESTRADIOL 2 MG PO TABS: 2.0000 mg | ORAL_TABLET | Freq: Every day | ORAL | 2 refills | Status: DC

## 2023-03-01 MED ORDER — LEVOTHYROXINE SODIUM 125 MCG PO TABS: 125.0000 ug | ORAL_TABLET | Freq: Every morning | ORAL | 2 refills | Status: DC

## 2023-03-01 MED ORDER — LANSOPRAZOLE 30 MG PO CPDR: 30.0000 mg | DELAYED_RELEASE_CAPSULE | Freq: Two times a day (BID) | ORAL | 2 refills | Status: DC

## 2023-03-01 NOTE — Telephone Encounter (Signed)
-----   Message from Sherrill Raring, [facility] sent at 02/24/2023  1:06 PM EDT ----- Regarding: Med Refills [facility] requesting 90 DS refills on behalf of the patient for the following prescriptions:  Buspirone 15mg  1/2 tab every morning and 1/2 tab at bedtime Lansoprazole 30mg  1 capsule BID before a meal Levothyroxine 1 tablet every morning Sertraline 100mg  2 tablets every morning Estradiol 2mg  1 tablet daily  Pharmacy Info: [facility] - [place], [place] - [address]  Phone: [phone] Fax: [phone]   Thank you, Sherrill Raring Clinical Pharmacist [phone]

## 2023-03-03 DIAGNOSIS — L718 Other rosacea: Secondary | ICD-10-CM | POA: Diagnosis not present

## 2023-03-03 DIAGNOSIS — L603 Nail dystrophy: Secondary | ICD-10-CM | POA: Diagnosis not present

## 2023-03-03 DIAGNOSIS — L03011 Cellulitis of right finger: Secondary | ICD-10-CM | POA: Diagnosis not present

## 2023-03-03 DIAGNOSIS — L03012 Cellulitis of left finger: Secondary | ICD-10-CM | POA: Diagnosis not present

## 2023-03-05 NOTE — Progress Notes (Signed)
Carelink Summary Report / Loop Recorder 

## 2023-03-11 DIAGNOSIS — M797 Fibromyalgia: Secondary | ICD-10-CM | POA: Diagnosis not present

## 2023-03-11 DIAGNOSIS — Z79899 Other long term (current) drug therapy: Secondary | ICD-10-CM | POA: Diagnosis not present

## 2023-03-11 DIAGNOSIS — M1990 Unspecified osteoarthritis, unspecified site: Secondary | ICD-10-CM | POA: Diagnosis not present

## 2023-03-11 DIAGNOSIS — K219 Gastro-esophageal reflux disease without esophagitis: Secondary | ICD-10-CM | POA: Diagnosis not present

## 2023-03-11 DIAGNOSIS — R5383 Other fatigue: Secondary | ICD-10-CM | POA: Diagnosis not present

## 2023-03-11 DIAGNOSIS — G8929 Other chronic pain: Secondary | ICD-10-CM | POA: Diagnosis not present

## 2023-03-11 DIAGNOSIS — Z13820 Encounter for screening for osteoporosis: Secondary | ICD-10-CM | POA: Diagnosis not present

## 2023-03-11 DIAGNOSIS — R768 Other specified abnormal immunological findings in serum: Secondary | ICD-10-CM | POA: Diagnosis not present

## 2023-03-11 DIAGNOSIS — I7300 Raynaud's syndrome without gangrene: Secondary | ICD-10-CM | POA: Diagnosis not present

## 2023-03-11 DIAGNOSIS — M329 Systemic lupus erythematosus, unspecified: Secondary | ICD-10-CM | POA: Diagnosis not present

## 2023-03-11 DIAGNOSIS — G9050 Complex regional pain syndrome I, unspecified: Secondary | ICD-10-CM | POA: Diagnosis not present

## 2023-03-15 ENCOUNTER — Ambulatory Visit (INDEPENDENT_AMBULATORY_CARE_PROVIDER_SITE_OTHER): Payer: PPO

## 2023-03-15 DIAGNOSIS — R55 Syncope and collapse: Secondary | ICD-10-CM

## 2023-03-15 LAB — CUP PACEART REMOTE DEVICE CHECK
Date Time Interrogation Session: 20240623230559
Implantable Pulse Generator Implant Date: 20221006

## 2023-03-23 DIAGNOSIS — Z79891 Long term (current) use of opiate analgesic: Secondary | ICD-10-CM | POA: Diagnosis not present

## 2023-03-23 DIAGNOSIS — G90512 Complex regional pain syndrome I of left upper limb: Secondary | ICD-10-CM | POA: Diagnosis not present

## 2023-03-23 DIAGNOSIS — G894 Chronic pain syndrome: Secondary | ICD-10-CM | POA: Diagnosis not present

## 2023-03-23 DIAGNOSIS — M542 Cervicalgia: Secondary | ICD-10-CM | POA: Diagnosis not present

## 2023-04-01 NOTE — Progress Notes (Signed)
Carelink Summary Report / Loop Recorder 

## 2023-04-12 ENCOUNTER — Ambulatory Visit: Payer: PPO | Admitting: Family Medicine

## 2023-04-14 ENCOUNTER — Encounter: Payer: Self-pay | Admitting: Cardiovascular Disease

## 2023-04-14 ENCOUNTER — Ambulatory Visit: Payer: PPO | Attending: Cardiovascular Disease | Admitting: Cardiovascular Disease

## 2023-04-14 VITALS — BP 120/84 | HR 62 | Ht 64.0 in | Wt 174.0 lb

## 2023-04-14 DIAGNOSIS — Z87898 Personal history of other specified conditions: Secondary | ICD-10-CM | POA: Diagnosis not present

## 2023-04-14 DIAGNOSIS — R5383 Other fatigue: Secondary | ICD-10-CM | POA: Diagnosis not present

## 2023-04-14 DIAGNOSIS — I471 Supraventricular tachycardia, unspecified: Secondary | ICD-10-CM

## 2023-04-14 NOTE — Patient Instructions (Signed)
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
## 2023-04-19 ENCOUNTER — Ambulatory Visit (INDEPENDENT_AMBULATORY_CARE_PROVIDER_SITE_OTHER): Payer: PPO

## 2023-04-19 DIAGNOSIS — R55 Syncope and collapse: Secondary | ICD-10-CM

## 2023-04-22 DIAGNOSIS — G90512 Complex regional pain syndrome I of left upper limb: Secondary | ICD-10-CM | POA: Diagnosis not present

## 2023-04-22 DIAGNOSIS — Z79891 Long term (current) use of opiate analgesic: Secondary | ICD-10-CM | POA: Diagnosis not present

## 2023-04-22 DIAGNOSIS — G894 Chronic pain syndrome: Secondary | ICD-10-CM | POA: Diagnosis not present

## 2023-04-22 DIAGNOSIS — M542 Cervicalgia: Secondary | ICD-10-CM | POA: Diagnosis not present

## 2023-05-05 NOTE — Progress Notes (Signed)
Carelink Summary Report / Loop Recorder 

## 2023-05-18 ENCOUNTER — Other Ambulatory Visit: Payer: Self-pay | Admitting: Family

## 2023-05-19 ENCOUNTER — Telehealth: Payer: Self-pay

## 2023-05-19 DIAGNOSIS — N951 Menopausal and female climacteric states: Secondary | ICD-10-CM

## 2023-05-19 DIAGNOSIS — F341 Dysthymic disorder: Secondary | ICD-10-CM

## 2023-05-19 DIAGNOSIS — E039 Hypothyroidism, unspecified: Secondary | ICD-10-CM

## 2023-05-19 NOTE — Progress Notes (Signed)
   05/19/2023  Patient ID: Zenovia Jarred, female   DOB: 1961/01/25, 62 y.o.   MRN: [id number]  Clinic routed request to assist patient with transferring to [facility] for prescription processing, as she was previously filling with [facility].  -Reviewed medication list with patient for accuracy -I will coordinate with [facility] providers to get prescriptions over to [facility] -Instructed patient to contact outside providers to request they send new prescriptions to [facility] -Patient was receiving medications via home delivery in adherence packaging and prefers the same through [facility] -She states prescriptions will be due to fill around second week of September -Verified home delivery address and gave patient phone number for pharmacy, so she can provide them with billing information for copays -Coordinating with PharmD to get patient set up for adherence packaging and home delivery  Lenna Gilford, PharmD, DPLA

## 2023-05-20 ENCOUNTER — Other Ambulatory Visit (HOSPITAL_COMMUNITY): Payer: Self-pay

## 2023-05-20 LAB — CUP PACEART REMOTE DEVICE CHECK
Date Time Interrogation Session: 20240828230047
Implantable Pulse Generator Implant Date: 20221006

## 2023-05-21 ENCOUNTER — Encounter: Payer: Self-pay | Admitting: Adult Health

## 2023-05-21 ENCOUNTER — Other Ambulatory Visit (HOSPITAL_COMMUNITY): Payer: Self-pay

## 2023-05-21 MED ORDER — FOLIC ACID 1 MG PO TABS
2.0000 mg | ORAL_TABLET | Freq: Two times a day (BID) | ORAL | 0 refills | Status: DC
  Filled 2023-05-21: qty 90, 23d supply, fill #0
  Filled 2023-05-29 – 2023-06-01 (×2): qty 120, 30d supply, fill #0

## 2023-05-25 ENCOUNTER — Other Ambulatory Visit (HOSPITAL_COMMUNITY): Payer: Self-pay

## 2023-05-25 ENCOUNTER — Other Ambulatory Visit: Payer: Self-pay

## 2023-05-25 ENCOUNTER — Ambulatory Visit (INDEPENDENT_AMBULATORY_CARE_PROVIDER_SITE_OTHER): Payer: PPO

## 2023-05-25 DIAGNOSIS — M542 Cervicalgia: Secondary | ICD-10-CM | POA: Diagnosis not present

## 2023-05-25 DIAGNOSIS — Z79891 Long term (current) use of opiate analgesic: Secondary | ICD-10-CM | POA: Diagnosis not present

## 2023-05-25 DIAGNOSIS — R55 Syncope and collapse: Secondary | ICD-10-CM | POA: Diagnosis not present

## 2023-05-25 DIAGNOSIS — G90512 Complex regional pain syndrome I of left upper limb: Secondary | ICD-10-CM | POA: Diagnosis not present

## 2023-05-25 DIAGNOSIS — G894 Chronic pain syndrome: Secondary | ICD-10-CM | POA: Diagnosis not present

## 2023-05-26 ENCOUNTER — Other Ambulatory Visit (HOSPITAL_COMMUNITY): Payer: Self-pay

## 2023-05-26 ENCOUNTER — Other Ambulatory Visit (HOSPITAL_BASED_OUTPATIENT_CLINIC_OR_DEPARTMENT_OTHER): Payer: Self-pay

## 2023-05-26 ENCOUNTER — Other Ambulatory Visit: Payer: Self-pay

## 2023-05-26 ENCOUNTER — Encounter: Payer: Self-pay | Admitting: Pharmacist

## 2023-05-26 ENCOUNTER — Ambulatory Visit (INDEPENDENT_AMBULATORY_CARE_PROVIDER_SITE_OTHER): Payer: PPO | Admitting: Family Medicine

## 2023-05-26 ENCOUNTER — Encounter: Payer: Self-pay | Admitting: Family Medicine

## 2023-05-26 VITALS — BP 110/72 | HR 70 | Temp 98.5°F | Ht 64.0 in | Wt 174.0 lb

## 2023-05-26 DIAGNOSIS — R0981 Nasal congestion: Secondary | ICD-10-CM

## 2023-05-26 DIAGNOSIS — R059 Cough, unspecified: Secondary | ICD-10-CM | POA: Diagnosis not present

## 2023-05-26 DIAGNOSIS — J324 Chronic pansinusitis: Secondary | ICD-10-CM

## 2023-05-26 LAB — POC COVID19 BINAXNOW: SARS Coronavirus 2 Ag: NEGATIVE

## 2023-05-26 MED ORDER — METHADONE HCL 5 MG PO TABS
5.0000 mg | ORAL_TABLET | Freq: Four times a day (QID) | ORAL | 0 refills | Status: DC
  Filled 2023-05-26 (×2): qty 120, 30d supply, fill #0

## 2023-05-26 MED ORDER — TOPIRAMATE 25 MG PO TABS
ORAL_TABLET | ORAL | 1 refills | Status: DC
  Filled 2023-05-26: qty 450, 90d supply, fill #0
  Filled 2023-05-29 – 2023-06-01 (×2): qty 150, 30d supply, fill #0
  Filled 2023-06-24 – 2023-06-29 (×2): qty 150, 30d supply, fill #1
  Filled 2023-07-23 – 2023-10-13 (×2): qty 150, 30d supply, fill #2
  Filled 2023-11-08 – 2023-11-09 (×3): qty 150, 30d supply, fill #3
  Filled 2023-11-15 – 2023-12-07 (×3): qty 150, 30d supply, fill #4

## 2023-05-26 MED ORDER — CEPHALEXIN 500 MG PO CAPS
500.0000 mg | ORAL_CAPSULE | Freq: Two times a day (BID) | ORAL | 0 refills | Status: DC
  Filled 2023-05-26 (×2): qty 14, 7d supply, fill #0

## 2023-05-26 MED ORDER — TIZANIDINE HCL 4 MG PO TABS
12.0000 mg | ORAL_TABLET | Freq: Every evening | ORAL | 1 refills | Status: DC
  Filled 2023-05-26: qty 270, 90d supply, fill #0
  Filled 2023-05-29 – 2023-06-01 (×2): qty 90, 30d supply, fill #0
  Filled 2023-06-24 – 2023-06-29 (×2): qty 90, 30d supply, fill #1
  Filled 2023-07-23: qty 90, 30d supply, fill #2
  Filled 2023-08-24: qty 90, 30d supply, fill #3
  Filled 2023-09-10 – 2023-09-16 (×3): qty 90, 30d supply, fill #4
  Filled 2023-10-13: qty 90, 30d supply, fill #5

## 2023-05-26 MED ORDER — HYDROMORPHONE HCL 4 MG PO TABS
4.0000 mg | ORAL_TABLET | Freq: Three times a day (TID) | ORAL | 0 refills | Status: DC | PRN
  Filled 2023-05-26 (×2): qty 60, 20d supply, fill #0

## 2023-05-26 NOTE — Patient Instructions (Signed)
A prescription for Keflex was sent to your pharmacy.  Make sure you take medication with food to help cut down on possible GI side effects.

## 2023-05-26 NOTE — Progress Notes (Signed)
Established Patient Office Visit   Subjective  Patient ID: Jo [name], female    DOB: 1960/10/25  Age: 62 y.o. MRN: [id number]  Chief Complaint  Patient presents with   Cough    Patient came in today for cough, congestion, left ear pain, started a month ago,     Patient is a 62 year old female followed by Dr. [place] and seen for acute concern.  Patient endorses right ear pain, right facial soreness x 3 days.  Patient also notes dental sensitivity times a few days.  Patient states productive cough, nasal congestion and fullness in face have been intermittent x 1 month.  Patient tried nasal saline and ipratropium nasal spray for symptoms.  States is unable to tolerate ipratropium.  Patient notes several intolerances to antibiotics.  Cough    Past Medical History:  Diagnosis Date   Allergy    Anemia    Anxiety    Anxiety and depression    Arrhythmia 2021   Arthritis    Asthma    Cervical cancer [facility])    age 80    Chronic headaches    Colon polyp    COPD (chronic obstructive pulmonary disease) (HCC)    no o2   Depression    Dysrhythmia    Endometriosis    Fibromyalgia    Fundic gland polyps of stomach, benign    Gastritis    GERD (gastroesophageal reflux disease)    H. pylori infection 2013   H/O gastric ulcer 1985   History of anorexia nervosa    History of bulimia    History of pneumonia    Hypothyroidism    Irritable bowel syndrome (IBS)    Obesity    Personal history of other mental and behavioral disorders 01/14/2008   Qualifier: Diagnosis of  By: Leone Payor MD, Charlyne Quale   Overview:  Overview:  Annotation: BULIMIA Qualifier: Diagnosis of  By: Davonna Belling    PONV (postoperative nausea and vomiting)    iv phenergan helps   REFLEX SYMPATHETIC DYSTROPHY 02/26/2009   Qualifier: Diagnosis of  By: Leone Payor MD, Alfonse Ras E    Rheumatoid arteritis (HCC)    RSD (reflex sympathetic dystrophy)    Sleep apnea    Systemic lupus (HCC) 2021   Ulcer     gastric, duodenal ulcer   Past Surgical History:  Procedure Laterality Date   CERVICAL SPINE SURGERY  1990   CERVIX SURGERY     CHOLECYSTECTOMY     COLONOSCOPY     HYSTEROSCOPY WITH D & C N/A 11/11/2021   Procedure: DILATATION AND CURETTAGE /HYSTEROSCOPY;  Surgeon: Milas Hock, MD;  Location: [facility] ;  Service: Gynecology;  Laterality: N/A;   KNEE ARTHROSCOPY  04/21/2012   right   LAPAROSCOPIC NISSEN FUNDOPLICATION  09/05/2007   Dr. Luretha Murphy   laparoscopy     for endometriosis   LOOP RECORDER INSERTION Left 06/2021   lumbar spondylosis injected  2013   Dr. Regino Schultze   NASAL SINUS SURGERY     THORACOSCOPY W/ THORACIC SYMPATHECTOMY     for RSD   TONSILLECTOMY     UPPER GASTROINTESTINAL ENDOSCOPY     Social History   Tobacco Use   Smoking status: Never   Smokeless tobacco: Never  Vaping Use   Vaping status: Never Used  Substance Use Topics   Alcohol use: No   Drug use: No   Family History  Problem Relation Age of Onset   Colon polyps Mother  part of intestines removed   Raynaud syndrome Mother    Irritable bowel syndrome Mother    Other Mother        cold agluten   Stroke Mother    Colon polyps Sister    Breast cancer Maternal Aunt        first time 68s second time 25s   Lung cancer Maternal Grandfather    Colon polyps Brother    Kidney Stones Brother    Colon cancer Neg Hx    Esophageal cancer Neg Hx    Rectal cancer Neg Hx    Stomach cancer Neg Hx    Migraines Neg Hx    Seizures Neg Hx    Allergies  Allergen Reactions   Bee Pollen Anaphylaxis   Diazepam     sucidal depression   Prednisone Nausea And Vomiting    Per pt can not take oral steroids    Amoxicillin     Severe diarrhea At high dose    Doxycycline     Pt does not remember reaction   Emetrol     Other reaction(s): GI upset   Erythromycin     n & v   Gabapentin     Doesn't remember; didn't tolerate   Imuran [Azathioprine]     Patient states this was  discontinued due to liver problems   Methotrexate Derivatives     diarrhea   Morphine     n & v   Propoxyphene N-Acetaminophen     n & V   Sulfa Antibiotics     MIGRAINES   Tetracyclines & Related     N & v   Propoxyphene Other (See Comments)      Review of Systems  Respiratory:  Positive for cough.    Negative unless stated above    Objective:     BP 110/72 (BP Location: Right Arm, Patient Position: Sitting, Cuff Size: Normal)   Pulse 70   Temp 98.5 F (36.9 C) (Oral)   Ht 5\' 4"  (1.626 m)   Wt 174 lb (78.9 kg)   SpO2 97%   BMI 29.87 kg/m    Physical Exam Constitutional:      General: She is not in acute distress.    Appearance: Normal appearance.  HENT:     Head: Normocephalic and atraumatic.     Right Ear: Tympanic membrane normal.     Left Ear: Tympanic membrane normal.     Nose: Mucosal edema present.     Right Sinus: Maxillary sinus tenderness present.     Mouth/Throat:     Mouth: Mucous membranes are moist.  Cardiovascular:     Rate and Rhythm: Normal rate and regular rhythm.     Heart sounds: Normal heart sounds. No murmur heard.    No gallop.  Pulmonary:     Effort: Pulmonary effort is normal. No respiratory distress.     Breath sounds: Normal breath sounds. No wheezing, rhonchi or rales.     Comments: Cough. Skin:    General: Skin is warm and dry.  Neurological:     Mental Status: She is alert and oriented to person, place, and time.      Results for orders placed or performed in visit on 05/26/23  POC COVID-19  Result Value Ref Range   SARS Coronavirus 2 Ag Negative Negative      Assessment & Plan:  Pansinusitis, unspecified chronicity -     Cephalexin; Take 1 capsule (500 mg total) by mouth 2 (two)  times daily.  Dispense: 14 capsule; Refill: 0  Nasal congestion -     POC COVID-19 BinaxNow  Intermittent nasal congestion, cough, facial pain/pressure x 1 month.  COVID testing negative.  Patient on immune suppressants for history of  SLE.  Start ABX, Keflex for sinusitis.  Patient with multiple antibiotic intolerances including amoxicillin, doxycycline, erythromycin, sulfa antibiotics, tetracyclines causing diarrhea.  Follow-up with PCP for continued or worsening symptoms.  Return if symptoms worsen or fail to improve.   Deeann Saint, MD

## 2023-05-27 ENCOUNTER — Telehealth: Payer: Self-pay | Admitting: Family Medicine

## 2023-05-27 ENCOUNTER — Other Ambulatory Visit (HOSPITAL_COMMUNITY): Payer: Self-pay

## 2023-05-27 ENCOUNTER — Other Ambulatory Visit: Payer: Self-pay

## 2023-05-27 MED ORDER — LEVOCETIRIZINE DIHYDROCHLORIDE 5 MG PO TABS
5.0000 mg | ORAL_TABLET | Freq: Two times a day (BID) | ORAL | 3 refills | Status: DC
  Filled 2023-05-27: qty 180, 90d supply, fill #0
  Filled 2023-05-29 – 2023-06-01 (×2): qty 60, 30d supply, fill #0
  Filled 2023-06-24: qty 60, 30d supply, fill #1

## 2023-05-27 NOTE — Telephone Encounter (Signed)
Pt was seen yesterday by Dr. Salomon Fick Pt was prescribed cephALEXin (KEFLEX) 500 MG capsule  Pt states she has Lupus and RA, and Pharmacist told her she should not be taking this, as it will not do anything for her.   Pt is asking if MD could please prescribe an alternative that works for Pt's with Lupus and RA? Pt is aware MD is OOO today.    [id number] [place], [place] - [phone] [place] [address]

## 2023-05-28 ENCOUNTER — Other Ambulatory Visit (HOSPITAL_COMMUNITY): Payer: Self-pay

## 2023-05-28 ENCOUNTER — Other Ambulatory Visit: Payer: Self-pay

## 2023-05-28 ENCOUNTER — Encounter: Payer: Self-pay | Admitting: Adult Health

## 2023-05-28 MED ORDER — LAMOTRIGINE 100 MG PO TABS
100.0000 mg | ORAL_TABLET | Freq: Two times a day (BID) | ORAL | 1 refills | Status: DC
  Filled 2023-05-28 – 2023-06-01 (×2): qty 60, 30d supply, fill #0
  Filled 2023-06-24 – 2023-06-29 (×2): qty 60, 30d supply, fill #1
  Filled 2023-07-23: qty 60, 30d supply, fill #2
  Filled 2023-08-24: qty 60, 30d supply, fill #3
  Filled 2023-09-10 – 2023-09-16 (×3): qty 60, 30d supply, fill #4
  Filled 2023-10-13: qty 60, 30d supply, fill #5

## 2023-05-28 MED ORDER — CYANOCOBALAMIN 1000 MCG/ML IJ SOLN
1000.0000 ug | INTRAMUSCULAR | 1 refills | Status: DC
  Filled 2023-05-28 – 2023-06-01 (×2): qty 1, 30d supply, fill #0
  Filled 2023-06-24 – 2023-06-29 (×2): qty 1, 30d supply, fill #1
  Filled 2023-07-23: qty 1, 30d supply, fill #2
  Filled 2023-08-24: qty 1, 30d supply, fill #3
  Filled 2023-09-14 – 2023-09-16 (×2): qty 1, 30d supply, fill #4
  Filled 2023-10-13: qty 1, 30d supply, fill #5

## 2023-05-28 MED ORDER — LEVOTHYROXINE SODIUM 125 MCG PO TABS
125.0000 ug | ORAL_TABLET | Freq: Every morning | ORAL | 2 refills | Status: DC
  Filled 2023-05-28 – 2023-06-01 (×2): qty 30, 30d supply, fill #0
  Filled 2023-06-24 – 2023-06-29 (×2): qty 30, 30d supply, fill #1
  Filled 2023-07-23: qty 30, 30d supply, fill #2
  Filled 2023-08-24: qty 30, 30d supply, fill #3
  Filled 2023-09-10 – 2023-09-16 (×3): qty 30, 30d supply, fill #4
  Filled 2023-10-13: qty 30, 30d supply, fill #5
  Filled 2023-11-08 – 2023-11-09 (×3): qty 30, 30d supply, fill #6
  Filled 2023-11-15 – 2023-12-07 (×3): qty 30, 30d supply, fill #7
  Filled 2023-12-27 – 2023-12-30 (×2): qty 30, 30d supply, fill #8

## 2023-05-28 MED ORDER — ESTRADIOL 2 MG PO TABS
2.0000 mg | ORAL_TABLET | Freq: Every day | ORAL | 2 refills | Status: DC
  Filled 2023-05-28 – 2023-06-01 (×2): qty 30, 30d supply, fill #0
  Filled 2023-06-24 – 2023-06-29 (×2): qty 30, 30d supply, fill #1
  Filled 2023-07-23: qty 30, 30d supply, fill #2
  Filled 2023-08-24: qty 30, 30d supply, fill #3
  Filled 2023-09-10 – 2023-09-16 (×3): qty 30, 30d supply, fill #4
  Filled 2023-10-13: qty 30, 30d supply, fill #5
  Filled 2023-11-08 – 2023-11-09 (×3): qty 30, 30d supply, fill #6
  Filled 2023-11-15 – 2023-12-07 (×3): qty 30, 30d supply, fill #7
  Filled 2023-12-27 – 2023-12-30 (×2): qty 30, 30d supply, fill #8

## 2023-05-28 MED ORDER — MEDROXYPROGESTERONE ACETATE 2.5 MG PO TABS
2.5000 mg | ORAL_TABLET | Freq: Every morning | ORAL | 2 refills | Status: DC
  Filled 2023-05-28 – 2023-06-01 (×2): qty 30, 30d supply, fill #0
  Filled 2023-06-24 – 2023-06-29 (×2): qty 30, 30d supply, fill #1
  Filled 2023-07-23: qty 30, 30d supply, fill #2
  Filled 2023-08-24: qty 30, 30d supply, fill #3
  Filled 2023-09-10 – 2023-09-16 (×3): qty 30, 30d supply, fill #4
  Filled 2023-10-13: qty 30, 30d supply, fill #5
  Filled 2023-11-08 – 2023-11-09 (×3): qty 30, 30d supply, fill #6
  Filled 2023-11-15 – 2023-12-07 (×3): qty 30, 30d supply, fill #7
  Filled 2023-12-27 – 2023-12-30 (×2): qty 30, 30d supply, fill #8

## 2023-05-28 MED ORDER — LANSOPRAZOLE 30 MG PO CPDR
30.0000 mg | DELAYED_RELEASE_CAPSULE | Freq: Two times a day (BID) | ORAL | 2 refills | Status: DC
  Filled 2023-05-28 – 2023-06-01 (×2): qty 60, 30d supply, fill #0
  Filled 2023-06-17 – 2023-06-29 (×3): qty 60, 30d supply, fill #1
  Filled 2023-07-21 – 2023-07-23 (×2): qty 60, 30d supply, fill #2
  Filled 2023-08-24: qty 60, 30d supply, fill #3
  Filled 2023-09-10 – 2023-09-16 (×3): qty 60, 30d supply, fill #4
  Filled 2023-10-13: qty 60, 30d supply, fill #5
  Filled 2023-11-08 – 2023-11-09 (×3): qty 60, 30d supply, fill #6
  Filled 2023-11-15 – 2023-12-07 (×3): qty 60, 30d supply, fill #7
  Filled 2023-12-27 – 2023-12-30 (×2): qty 60, 30d supply, fill #8

## 2023-05-28 MED ORDER — "BD LUER-LOK SYRINGE 23G X 1"" 3 ML MISC"
1.0000 | 3 refills | Status: DC
  Filled 2023-05-28 – 2023-06-01 (×2): qty 1, 28d supply, fill #0
  Filled 2023-06-24: qty 6, 90d supply, fill #1
  Filled 2023-06-29: qty 2, 28d supply, fill #1
  Filled 2023-07-23: qty 4, 28d supply, fill #2
  Filled 2023-08-24: qty 4, 28d supply, fill #3
  Filled 2023-08-25 – 2023-10-13 (×2): qty 1, 28d supply, fill #4

## 2023-05-28 MED ORDER — SERTRALINE HCL 100 MG PO TABS
200.0000 mg | ORAL_TABLET | Freq: Every morning | ORAL | 2 refills | Status: DC
  Filled 2023-05-28 – 2023-06-01 (×2): qty 60, 30d supply, fill #0
  Filled 2023-06-24 – 2023-06-29 (×2): qty 60, 30d supply, fill #1
  Filled 2023-07-23: qty 60, 30d supply, fill #2
  Filled 2023-08-24: qty 60, 30d supply, fill #3
  Filled 2023-09-10 – 2023-09-16 (×3): qty 60, 30d supply, fill #4
  Filled 2023-10-13: qty 60, 30d supply, fill #5
  Filled 2023-11-08 – 2023-11-09 (×3): qty 60, 30d supply, fill #6
  Filled 2023-11-15 – 2023-12-07 (×3): qty 60, 30d supply, fill #7
  Filled 2023-12-27 – 2023-12-30 (×2): qty 60, 30d supply, fill #8

## 2023-05-28 NOTE — Telephone Encounter (Signed)
Can you call the pharmacy and find out what they are talking about.

## 2023-05-28 NOTE — Telephone Encounter (Signed)
No current reason why pt cannot take Keflex.  Follow up with pcp for further concerns.

## 2023-05-29 ENCOUNTER — Other Ambulatory Visit: Payer: Self-pay

## 2023-05-29 ENCOUNTER — Encounter: Payer: Self-pay | Admitting: Adult Health

## 2023-05-31 ENCOUNTER — Other Ambulatory Visit: Payer: Self-pay

## 2023-05-31 ENCOUNTER — Other Ambulatory Visit (HOSPITAL_COMMUNITY): Payer: Self-pay

## 2023-05-31 NOTE — Telephone Encounter (Signed)
Called and spoke with patient.

## 2023-06-01 ENCOUNTER — Other Ambulatory Visit: Payer: Self-pay

## 2023-06-01 ENCOUNTER — Other Ambulatory Visit (HOSPITAL_COMMUNITY): Payer: Self-pay

## 2023-06-01 ENCOUNTER — Telehealth: Payer: Self-pay | Admitting: Family Medicine

## 2023-06-01 ENCOUNTER — Encounter: Payer: Self-pay | Admitting: Adult Health

## 2023-06-01 NOTE — Telephone Encounter (Signed)
Prescription Request  06/01/2023  LOV: 12/14/2022  What is the name of the medication or equipment? busPIRone (BUSPAR) 7.5 MG tablet   This Rx was prescribed by another provider, but [facility] is asking if MD could please send a refill, as Pt is no longer seeing that provider.?  Have you contacted your pharmacy to request a refill? Yes   Which pharmacy would you like this sent to?   [place] [facility] [address] [place] [address] Phone: [phone] Fax: [phone]   Patient notified that their request is being sent to the clinical staff for review and that they should receive a response within 2 business days.   Please advise at Mobile [phone] (mobile)

## 2023-06-02 NOTE — Progress Notes (Signed)
Carelink Summary Report / Loop Recorder 

## 2023-06-03 ENCOUNTER — Other Ambulatory Visit (HOSPITAL_COMMUNITY): Payer: Self-pay

## 2023-06-04 ENCOUNTER — Other Ambulatory Visit (HOSPITAL_COMMUNITY): Payer: Self-pay

## 2023-06-04 ENCOUNTER — Other Ambulatory Visit: Payer: Self-pay

## 2023-06-04 MED ORDER — BUSPIRONE HCL 7.5 MG PO TABS
7.5000 mg | ORAL_TABLET | Freq: Two times a day (BID) | ORAL | 1 refills | Status: DC
  Filled 2023-06-04: qty 60, 30d supply, fill #0
  Filled 2023-06-04: qty 180, 90d supply, fill #0
  Filled 2023-06-24 – 2023-06-29 (×2): qty 60, 30d supply, fill #1
  Filled 2023-07-23: qty 60, 30d supply, fill #2
  Filled 2023-08-24 (×2): qty 60, 30d supply, fill #3
  Filled 2023-09-10 – 2023-09-16 (×3): qty 60, 30d supply, fill #4
  Filled 2023-10-13: qty 60, 30d supply, fill #5

## 2023-06-04 NOTE — Telephone Encounter (Signed)
Reviewed prior OV notes - rx has been prescribed by PCP in the past - was just changed to 7.5 mg bid so pt didn't have the cut the 15 mg tablet. Rx sent.

## 2023-06-17 ENCOUNTER — Other Ambulatory Visit: Payer: Self-pay

## 2023-06-22 ENCOUNTER — Other Ambulatory Visit (HOSPITAL_COMMUNITY): Payer: Self-pay

## 2023-06-22 ENCOUNTER — Other Ambulatory Visit: Payer: Self-pay

## 2023-06-22 DIAGNOSIS — G90512 Complex regional pain syndrome I of left upper limb: Secondary | ICD-10-CM | POA: Diagnosis not present

## 2023-06-22 DIAGNOSIS — Z79891 Long term (current) use of opiate analgesic: Secondary | ICD-10-CM | POA: Diagnosis not present

## 2023-06-22 DIAGNOSIS — M542 Cervicalgia: Secondary | ICD-10-CM | POA: Diagnosis not present

## 2023-06-22 DIAGNOSIS — G894 Chronic pain syndrome: Secondary | ICD-10-CM | POA: Diagnosis not present

## 2023-06-22 LAB — CUP PACEART REMOTE DEVICE CHECK
Date Time Interrogation Session: 20240930231005
Implantable Pulse Generator Implant Date: 20221006

## 2023-06-22 MED ORDER — METHADONE HCL 5 MG PO TABS
5.0000 mg | ORAL_TABLET | Freq: Four times a day (QID) | ORAL | 0 refills | Status: DC
  Filled 2023-06-22 – 2023-06-23 (×2): qty 120, 30d supply, fill #0

## 2023-06-23 ENCOUNTER — Other Ambulatory Visit: Payer: Self-pay

## 2023-06-23 ENCOUNTER — Other Ambulatory Visit (HOSPITAL_COMMUNITY): Payer: Self-pay

## 2023-06-24 ENCOUNTER — Encounter: Payer: Self-pay | Admitting: Adult Health

## 2023-06-24 ENCOUNTER — Other Ambulatory Visit: Payer: Self-pay

## 2023-06-24 ENCOUNTER — Other Ambulatory Visit (HOSPITAL_COMMUNITY): Payer: Self-pay

## 2023-06-24 MED ORDER — FOLIC ACID 1 MG PO TABS
2.0000 mg | ORAL_TABLET | Freq: Two times a day (BID) | ORAL | 0 refills | Status: DC
  Filled 2023-06-27 – 2023-06-29 (×2): qty 120, 30d supply, fill #0

## 2023-06-25 ENCOUNTER — Other Ambulatory Visit (HOSPITAL_COMMUNITY): Payer: Self-pay

## 2023-06-25 ENCOUNTER — Telehealth (INDEPENDENT_AMBULATORY_CARE_PROVIDER_SITE_OTHER): Payer: PPO | Admitting: Family Medicine

## 2023-06-25 ENCOUNTER — Encounter: Payer: Self-pay | Admitting: Family Medicine

## 2023-06-25 VITALS — Ht 64.0 in

## 2023-06-25 DIAGNOSIS — J329 Chronic sinusitis, unspecified: Secondary | ICD-10-CM | POA: Diagnosis not present

## 2023-06-25 DIAGNOSIS — J302 Other seasonal allergic rhinitis: Secondary | ICD-10-CM

## 2023-06-25 MED ORDER — AMOXICILLIN-POT CLAVULANATE 875-125 MG PO TABS: 1.0000 | ORAL_TABLET | Freq: Two times a day (BID) | ORAL | 0 refills | Status: AC

## 2023-06-25 NOTE — Progress Notes (Signed)
Virtual Visit via Video Note I connected with Jo [name] on 06/25/2023 by a video enabled telemedicine application and verified that I am speaking with the correct person using two identifiers. Location patient: home Location provider:work or home office Persons participating in the virtual visit: patient, provider  I discussed the limitations of evaluation and management by telemedicine and the availability of in person appointments. The patient expressed understanding and agreed to proceed.  Chief Complaint  Patient presents with   Sinus Problem   Jo [name] is a 62 y.o. female with a PMHx significant for seasonal allergies, SVT, SLE, reflex sympathetic dystrophy/complex regional pain syndrome I, chronic pain, GERD, migraine headaches, fibromyalgia, depression, and anxiety who today complains of a sinus problem.   Last seen on 11/24/2022  HPI:  Patient complains of headache, facial soreness, congestion especially on the right side, sore throat, and postnasal drainage.  She notes the facial soreness is worsened with palpation.  She denies fever, SOB, or wheezing.  Patient states she saw Dr. Salomon Fick for a sinus infection about 3 weeks ago and was given cephalexin 500 mg bid. She notes that she improved with cephalexin but the symptoms have returned.  She notes she has seasonal allergies and a chronic productive cough (no blood) that is normal for her. She has been using allegra and flonase for her symptoms.  She notes she had a sinus infection earlier this year which was discovered at a dental appointment.  She reports she has allergies to some antibiotics.   ROS: See pertinent positives and negatives per HPI.  Past Medical History:  Diagnosis Date   Allergy    Anemia    Anxiety    Anxiety and depression    Arrhythmia 2021   Arthritis    Asthma    Cervical cancer [facility])    age 28    Chronic headaches    Colon polyp    COPD (chronic obstructive pulmonary disease) (HCC)     no o2   Depression    Dysrhythmia    Endometriosis    Fibromyalgia    Fundic gland polyps of stomach, benign    Gastritis    GERD (gastroesophageal reflux disease)    H. pylori infection 2013   H/O gastric ulcer 1985   History of anorexia nervosa    History of bulimia    History of pneumonia    Hypothyroidism    Irritable bowel syndrome (IBS)    Obesity    Personal history of other mental and behavioral disorders 01/14/2008   Qualifier: Diagnosis of  By: Leone Payor MD, Charlyne Quale   Overview:  Overview:  Annotation: BULIMIA Qualifier: Diagnosis of  By: Davonna Belling    PONV (postoperative nausea and vomiting)    iv phenergan helps   REFLEX SYMPATHETIC DYSTROPHY 02/26/2009   Qualifier: Diagnosis of  By: Leone Payor MD, Alfonse Ras E    Rheumatoid arteritis (HCC)    RSD (reflex sympathetic dystrophy)    Sleep apnea    Systemic lupus (HCC) 2021   Ulcer    gastric, duodenal ulcer    Past Surgical History:  Procedure Laterality Date   CERVICAL SPINE SURGERY  1990   CERVIX SURGERY     CHOLECYSTECTOMY     COLONOSCOPY     HYSTEROSCOPY WITH D & C N/A 11/11/2021   Procedure: DILATATION AND CURETTAGE /HYSTEROSCOPY;  Surgeon: Milas Hock, MD;  Location: [facility];  Service: Gynecology;  Laterality: N/A;   KNEE ARTHROSCOPY  04/21/2012   right   LAPAROSCOPIC NISSEN FUNDOPLICATION  09/05/2007   Dr. Luretha Murphy   laparoscopy     for endometriosis   LOOP RECORDER INSERTION Left 06/2021   lumbar spondylosis injected  2013   Dr. Regino Schultze   NASAL SINUS SURGERY     THORACOSCOPY W/ THORACIC SYMPATHECTOMY     for RSD   TONSILLECTOMY     UPPER GASTROINTESTINAL ENDOSCOPY      Family History  Problem Relation Age of Onset   Colon polyps Mother        part of intestines removed   Raynaud syndrome Mother    Irritable bowel syndrome Mother    Other Mother        cold agluten   Stroke Mother    Colon polyps Sister    Breast cancer Maternal Aunt        first  time 36s second time 77s   Lung cancer Maternal Grandfather    Colon polyps Brother    Kidney Stones Brother    Colon cancer Neg Hx    Esophageal cancer Neg Hx    Rectal cancer Neg Hx    Stomach cancer Neg Hx    Migraines Neg Hx    Seizures Neg Hx     Social History   Socioeconomic History   Marital status: Divorced    Spouse name: Not on file   Number of children: 0   Years of education: 16   Highest education level: Bachelor's degree (e.g., BA, AB, BS)  Occupational History   Occupation: Public house manager: UNEMPLOYED  Tobacco Use   Smoking status: Never   Smokeless tobacco: Never  Vaping Use   Vaping status: Never Used  Substance and Sexual Activity   Alcohol use: No   Drug use: No   Sexual activity: Not Currently    Birth control/protection: None  Other Topics Concern   Not on file  Social History Narrative   Lives at home alone.   Caffeine use: Drinks coffee- 1 cup in morning   Ginger ale   Social Determinants of Health   Financial Resource Strain: Medium Risk (11/10/2022)   Overall Financial Resource Strain (CARDIA)    Difficulty of Paying Living Expenses: Somewhat hard  Food Insecurity: No Food Insecurity (11/10/2022)   Hunger Vital Sign    Worried About Running Out of Food in the Last Year: Never true    Ran Out of Food in the Last Year: Never true  Transportation Needs: No Transportation Needs (11/10/2022)   PRAPARE - Administrator, Civil Service (Medical): No    Lack of Transportation (Non-Medical): No  Physical Activity: Unknown (11/10/2022)   Exercise Vital Sign    Days of Exercise per Week: 0 days    Minutes of Exercise per Session: Not on file  Stress: Stress Concern Present (11/10/2022)   [employer] of Occupational Health - Occupational Stress Questionnaire    Feeling of Stress : To some extent  Social Connections: Moderately Integrated (11/10/2022)   Social Connection and Isolation Panel [NHANES]     Frequency of Communication with Friends and Family: More than three times a week    Frequency of Social Gatherings with Friends and Family: Patient declined    Attends Religious Services: More than 4 times per year    Active Member of [employer] or Organizations: Yes    Attends Engineer, structural: More than 4 times per year    Marital Status: Divorced  Intimate Partner Violence: Not At Risk (10/09/2020)   Humiliation, Afraid, Rape, and Kick questionnaire    Fear of Current or Ex-Partner: No    Emotionally Abused: No    Physically Abused: No    Sexually Abused: No     Current Outpatient Medications:    amoxicillin-clavulanate (AUGMENTIN) 875-125 MG tablet, Take 1 tablet by mouth 2 (two) times daily for 10 days., Disp: 20 tablet, Rfl: 0   B-D 3CC LUER-LOK SYR 23GX1" 23G X 1" 3 ML MISC, Use to inject B12 once a month, Disp: 3 each, Rfl: 3   Belimumab (BENLYSTA) 200 MG/ML SOSY, once a week., Disp: , Rfl:    betamethasone dipropionate 0.05 % lotion, Apply topically as needed., Disp: , Rfl:    busPIRone (BUSPAR) 7.5 MG tablet, Take 1 tablet (7.5 mg total) by mouth 2 (two) times daily., Disp: 180 tablet, Rfl: 1   cholecalciferol (VITAMIN D) 1000 UNITS tablet, Take 2,000 Units by mouth daily., Disp: , Rfl:    Cranberry 200 MG CAPS, Take 200 mg by mouth 2 (two) times daily., Disp: , Rfl:    cyanocobalamin (VITAMIN B12) 1000 MCG/ML injection, Inject 1 mL (1,000 mcg total) into the muscle every 30 (thirty) days., Disp: 3 mL, Rfl: 1   estradiol (ESTRACE) 2 MG tablet, Take 1 tablet (2 mg total) by mouth daily., Disp: 90 tablet, Rfl: 2   fluticasone (CUTIVATE) 0.05 % cream, Apply 1 application topically 2 (two) times daily as needed (rash). , Disp: , Rfl:    folic acid (FOLVITE) 1 MG tablet, Take 2 tablets (2 mg total) by mouth in the morning and at bedtime., Disp: 120 tablet, Rfl: 0   HYDROmorphone (DILAUDID) 4 MG tablet, Take by mouth as needed for severe pain., Disp: , Rfl:    ketorolac  (TORADOL) 30 MG/ML injection, Inject 1 mL (30 mg total) into the muscle daily as needed for moderate pain., Disp: 5 mL, Rfl: 2   lamoTRIgine (LAMICTAL) 100 MG tablet, Take 1 tablet (100 mg total) by mouth in the morning at breakfast and at bedtime., Disp: 180 tablet, Rfl: 1   lansoprazole (PREVACID) 30 MG capsule, Take 1 capsule (30 mg total) by mouth 2 (two) times daily before a meal., Disp: 180 capsule, Rfl: 2   levocetirizine (XYZAL) 5 MG tablet, Take 5 mg by mouth every evening., Disp: , Rfl:    levothyroxine (SYNTHROID) 125 MCG tablet, Take 1 tablet (125 mcg total) by mouth every morning., Disp: 90 tablet, Rfl: 2   LYSINE PO, Take 1 tablet by mouth daily., Disp: , Rfl:    medroxyPROGESTERone (PROVERA) 2.5 MG tablet, Take 1 tablet (2.5 mg total) by mouth every morning., Disp: 90 tablet, Rfl: 2   methadone (DOLOPHINE) 5 MG tablet, Take 1 tablet by mouth 4 (four) times daily as needed., Disp: , Rfl:    metoCLOPramide (REGLAN) 10 MG tablet, Take 1 tablet (10 mg total) by mouth every 6 (six) hours as needed for nausea., Disp: 30 tablet, Rfl: 0   ondansetron (ZOFRAN) 4 MG tablet, Take 4 mg by mouth 3 (three) times daily as needed., Disp: , Rfl:    sertraline (ZOLOFT) 100 MG tablet, Take 2 tablets (200 mg total) by mouth every morning., Disp: 180 tablet, Rfl: 2   TH FAMOTIDINE 10 PO, Take 10 mg by mouth once a week. 1-2 tabs once weekly 54min-1hr prior to Benlysta injection to aid with injection site rxn, Disp: , Rfl:    tiZANidine (ZANAFLEX) 4 MG tablet, Take 3  tablets (12 mg total) by mouth at bedtime., Disp: 270 tablet, Rfl: 1   topiramate (TOPAMAX) 25 MG tablet, Take 1 tablet (25 mg total) by mouth every morning AND 4 tablets (100 mg total) at bedtime., Disp: 450 tablet, Rfl: 1   triamcinolone cream (KENALOG) 0.1 %, daily as needed., Disp: , Rfl:   EXAM:  VITALS per patient if applicable:  GENERAL: alert, oriented, appears well and in no acute distress  HEENT: atraumatic, conjunctiva clear,  no obvious abnormalities on inspection of external nose and ears  NECK: normal movements of the head and neck  LUNGS: on inspection no signs of respiratory distress, breathing rate appears normal, no obvious gross SOB, gasping or wheezing  CV: no obvious cyanosis  MS: moves all visible extremities without noticeable abnormality  PSYCH/NEURO: pleasant and cooperative, no obvious depression or anxiety, speech and thought processing grossly intact  ASSESSMENT AND PLAN:  Discussed the following assessment and plan:  Seasonal allergies  Sinusitis, unspecified chronicity, unspecified location - Plan: amoxicillin-clavulanate (AUGMENTIN) 875-125 MG tablet  Seasonal allergies  Sinusitis, unspecified chronicity, unspecified location -     Amoxicillin-Pot Clavulanate; Take 1 tablet by mouth 2 (two) times daily for 10 days.  Dispense: 20 tablet; Refill: 0    We discussed possible serious and likely etiologies, options for evaluation and workup, limitations of telemedicine visit vs in person visit, treatment, treatment risks and precautions. The patient was advised to call back or seek an in-person evaluation if the symptoms worsen or if the condition fails to improve as anticipated. I discussed the assessment and treatment plan with the patient. The patient was provided an opportunity to ask questions and all were answered. The patient agreed with the plan and demonstrated an understanding of the instructions.  I, Rolla Etienne Wierda, acting as a scribe for Jaquese Irving [place], MD., have documented all relevant documentation on the behalf of Jo Hoch [place], MD, as directed by  Franki Stemen [place], MD while in the presence of Jo Cataldi [place], MD.   I, Jessie Schrieber [place], MD, have reviewed all documentation for this visit. The documentation on 06/25/23 for the exam, diagnosis, procedures, and orders are all accurate and complete.  No follow-ups on file.  Milind Raether [place], MD

## 2023-06-28 ENCOUNTER — Encounter: Payer: Self-pay | Admitting: Adult Health

## 2023-06-28 ENCOUNTER — Other Ambulatory Visit: Payer: Self-pay

## 2023-06-28 ENCOUNTER — Ambulatory Visit (INDEPENDENT_AMBULATORY_CARE_PROVIDER_SITE_OTHER): Payer: PPO

## 2023-06-28 ENCOUNTER — Other Ambulatory Visit (HOSPITAL_COMMUNITY): Payer: Self-pay

## 2023-06-28 DIAGNOSIS — R55 Syncope and collapse: Secondary | ICD-10-CM

## 2023-06-29 ENCOUNTER — Other Ambulatory Visit (HOSPITAL_COMMUNITY): Payer: Self-pay

## 2023-06-29 ENCOUNTER — Other Ambulatory Visit: Payer: Self-pay

## 2023-06-29 ENCOUNTER — Other Ambulatory Visit: Payer: Self-pay | Admitting: Family Medicine

## 2023-06-29 ENCOUNTER — Encounter: Payer: Self-pay | Admitting: Adult Health

## 2023-06-30 ENCOUNTER — Other Ambulatory Visit (HOSPITAL_COMMUNITY): Payer: Self-pay

## 2023-06-30 ENCOUNTER — Other Ambulatory Visit: Payer: Self-pay

## 2023-06-30 MED ORDER — LEVOCETIRIZINE DIHYDROCHLORIDE 5 MG PO TABS
5.0000 mg | ORAL_TABLET | Freq: Every evening | ORAL | 2 refills | Status: DC
  Filled 2023-06-30: qty 30, 30d supply, fill #0
  Filled 2023-07-23: qty 30, 30d supply, fill #1
  Filled 2023-08-24: qty 30, 30d supply, fill #2
  Filled 2023-09-10 – 2023-09-16 (×3): qty 30, 30d supply, fill #3
  Filled 2023-10-13: qty 30, 30d supply, fill #5
  Filled 2023-10-13: qty 30, 30d supply, fill #4
  Filled 2023-11-08 – 2023-11-09 (×3): qty 30, 30d supply, fill #5
  Filled 2023-11-15 – 2023-12-07 (×3): qty 30, 30d supply, fill #6
  Filled 2023-12-27 – 2023-12-30 (×2): qty 30, 30d supply, fill #7
  Filled 2024-01-11 – 2024-02-04 (×3): qty 30, 30d supply, fill #8

## 2023-07-12 NOTE — Progress Notes (Signed)
Carelink Summary Report / Loop Recorder 

## 2023-07-13 DIAGNOSIS — M797 Fibromyalgia: Secondary | ICD-10-CM | POA: Diagnosis not present

## 2023-07-13 DIAGNOSIS — M1990 Unspecified osteoarthritis, unspecified site: Secondary | ICD-10-CM | POA: Diagnosis not present

## 2023-07-13 DIAGNOSIS — R768 Other specified abnormal immunological findings in serum: Secondary | ICD-10-CM | POA: Diagnosis not present

## 2023-07-13 DIAGNOSIS — G9050 Complex regional pain syndrome I, unspecified: Secondary | ICD-10-CM | POA: Diagnosis not present

## 2023-07-13 DIAGNOSIS — R5383 Other fatigue: Secondary | ICD-10-CM | POA: Diagnosis not present

## 2023-07-13 DIAGNOSIS — I7300 Raynaud's syndrome without gangrene: Secondary | ICD-10-CM | POA: Diagnosis not present

## 2023-07-13 DIAGNOSIS — Z23 Encounter for immunization: Secondary | ICD-10-CM | POA: Diagnosis not present

## 2023-07-13 DIAGNOSIS — Z79899 Other long term (current) drug therapy: Secondary | ICD-10-CM | POA: Diagnosis not present

## 2023-07-13 DIAGNOSIS — M329 Systemic lupus erythematosus, unspecified: Secondary | ICD-10-CM | POA: Diagnosis not present

## 2023-07-13 DIAGNOSIS — K219 Gastro-esophageal reflux disease without esophagitis: Secondary | ICD-10-CM | POA: Diagnosis not present

## 2023-07-13 DIAGNOSIS — G8929 Other chronic pain: Secondary | ICD-10-CM | POA: Diagnosis not present

## 2023-07-16 ENCOUNTER — Other Ambulatory Visit (HOSPITAL_COMMUNITY): Payer: Self-pay

## 2023-07-19 ENCOUNTER — Other Ambulatory Visit: Payer: Self-pay

## 2023-07-19 ENCOUNTER — Other Ambulatory Visit (HOSPITAL_COMMUNITY): Payer: Self-pay

## 2023-07-19 MED ORDER — TOPIRAMATE 25 MG PO TABS
ORAL_TABLET | ORAL | 0 refills | Status: DC
  Filled 2023-07-23: qty 150, 30d supply, fill #0
  Filled 2023-08-24: qty 150, 30d supply, fill #1
  Filled 2023-09-10 – 2023-09-16 (×3): qty 150, 30d supply, fill #2

## 2023-07-19 MED ORDER — IPRATROPIUM BROMIDE 0.06 % NA SOLN
NASAL | 0 refills | Status: DC
  Filled 2023-07-19: qty 15, 14d supply, fill #0

## 2023-07-19 MED ORDER — BETAMETHASONE DIPROPIONATE 0.05 % EX CREA: TOPICAL_CREAM | CUTANEOUS | 0 refills | Status: AC

## 2023-07-19 MED ORDER — ONDANSETRON HCL 4 MG PO TABS
4.0000 mg | ORAL_TABLET | Freq: Three times a day (TID) | ORAL | 0 refills | Status: DC | PRN
  Filled 2023-07-19: qty 45, 15d supply, fill #0

## 2023-07-19 MED ORDER — FLUTICASONE PROPIONATE 0.05 % EX CREA
1.0000 | TOPICAL_CREAM | Freq: Two times a day (BID) | CUTANEOUS | 2 refills | Status: AC
  Filled 2023-07-23: qty 60, 15d supply, fill #0

## 2023-07-19 MED ORDER — CYANOCOBALAMIN 1000 MCG/ML IJ SOLN
1000.0000 ug | INTRAMUSCULAR | 0 refills | Status: DC
  Filled 2023-07-23: qty 3, 90d supply, fill #0

## 2023-07-19 MED ORDER — TRIAMCINOLONE ACETONIDE 0.025 % EX CREA
TOPICAL_CREAM | CUTANEOUS | 0 refills | Status: AC
  Filled 2023-07-19: qty 80, 30d supply, fill #0

## 2023-07-20 ENCOUNTER — Other Ambulatory Visit: Payer: Self-pay

## 2023-07-20 ENCOUNTER — Other Ambulatory Visit (HOSPITAL_COMMUNITY): Payer: Self-pay

## 2023-07-20 MED ORDER — LIDOCAINE 3 % EX CREA: TOPICAL_CREAM | Freq: Three times a day (TID) | CUTANEOUS | 1 refills | Status: DC | PRN

## 2023-07-20 MED ORDER — BENLYSTA 200 MG/ML ~~LOC~~ SOAJ: 200.0000 mg | SUBCUTANEOUS | 11 refills | Status: DC

## 2023-07-21 ENCOUNTER — Other Ambulatory Visit: Payer: Self-pay

## 2023-07-23 ENCOUNTER — Other Ambulatory Visit: Payer: Self-pay | Admitting: Internal Medicine

## 2023-07-23 ENCOUNTER — Encounter: Payer: Self-pay | Admitting: Adult Health

## 2023-07-23 ENCOUNTER — Other Ambulatory Visit: Payer: Self-pay

## 2023-07-23 ENCOUNTER — Other Ambulatory Visit (HOSPITAL_COMMUNITY): Payer: Self-pay

## 2023-07-26 ENCOUNTER — Other Ambulatory Visit: Payer: Self-pay

## 2023-07-27 ENCOUNTER — Other Ambulatory Visit (HOSPITAL_COMMUNITY): Payer: Self-pay

## 2023-07-27 ENCOUNTER — Other Ambulatory Visit: Payer: Self-pay

## 2023-07-27 ENCOUNTER — Encounter: Payer: Self-pay | Admitting: Adult Health

## 2023-07-27 DIAGNOSIS — G90512 Complex regional pain syndrome I of left upper limb: Secondary | ICD-10-CM | POA: Diagnosis not present

## 2023-07-27 DIAGNOSIS — Z79891 Long term (current) use of opiate analgesic: Secondary | ICD-10-CM | POA: Diagnosis not present

## 2023-07-27 DIAGNOSIS — G894 Chronic pain syndrome: Secondary | ICD-10-CM | POA: Diagnosis not present

## 2023-07-27 DIAGNOSIS — M542 Cervicalgia: Secondary | ICD-10-CM | POA: Diagnosis not present

## 2023-07-27 MED ORDER — FOLIC ACID 1 MG PO TABS
2.0000 mg | ORAL_TABLET | Freq: Two times a day (BID) | ORAL | 0 refills | Status: DC
  Filled 2023-07-27: qty 120, 30d supply, fill #0

## 2023-07-27 MED ORDER — HYDROMORPHONE HCL 4 MG PO TABS
4.0000 mg | ORAL_TABLET | Freq: Three times a day (TID) | ORAL | 0 refills | Status: DC | PRN
  Filled 2023-07-27: qty 60, 20d supply, fill #0

## 2023-07-27 MED ORDER — METHADONE HCL 5 MG PO TABS
5.0000 mg | ORAL_TABLET | Freq: Four times a day (QID) | ORAL | 0 refills | Status: DC
  Filled 2023-07-27: qty 120, 30d supply, fill #0

## 2023-07-28 ENCOUNTER — Other Ambulatory Visit: Payer: Self-pay

## 2023-07-28 ENCOUNTER — Other Ambulatory Visit (HOSPITAL_COMMUNITY): Payer: Self-pay

## 2023-07-28 LAB — CUP PACEART REMOTE DEVICE CHECK
Date Time Interrogation Session: 20241102230240
Implantable Pulse Generator Implant Date: 20221006

## 2023-08-02 ENCOUNTER — Ambulatory Visit (INDEPENDENT_AMBULATORY_CARE_PROVIDER_SITE_OTHER): Payer: PPO

## 2023-08-02 DIAGNOSIS — R55 Syncope and collapse: Secondary | ICD-10-CM | POA: Diagnosis not present

## 2023-08-03 ENCOUNTER — Other Ambulatory Visit (HOSPITAL_COMMUNITY): Payer: Self-pay

## 2023-08-04 ENCOUNTER — Other Ambulatory Visit (HOSPITAL_COMMUNITY): Payer: Self-pay

## 2023-08-04 ENCOUNTER — Other Ambulatory Visit: Payer: Self-pay

## 2023-08-04 ENCOUNTER — Encounter: Payer: Self-pay | Admitting: Adult Health

## 2023-08-04 MED ORDER — FOLIC ACID 1 MG PO TABS
1.0000 mg | ORAL_TABLET | Freq: Two times a day (BID) | ORAL | 2 refills | Status: DC
  Filled 2023-08-04 – 2023-08-24 (×3): qty 60, 30d supply, fill #0
  Filled 2023-09-10 – 2023-09-16 (×3): qty 60, 30d supply, fill #1
  Filled 2023-10-13: qty 60, 30d supply, fill #2

## 2023-08-09 ENCOUNTER — Other Ambulatory Visit: Payer: Self-pay

## 2023-08-17 ENCOUNTER — Other Ambulatory Visit: Payer: Self-pay

## 2023-08-24 ENCOUNTER — Other Ambulatory Visit (HOSPITAL_COMMUNITY): Payer: Self-pay

## 2023-08-24 ENCOUNTER — Other Ambulatory Visit: Payer: Self-pay

## 2023-08-24 ENCOUNTER — Encounter: Payer: Self-pay | Admitting: Adult Health

## 2023-08-24 DIAGNOSIS — G90512 Complex regional pain syndrome I of left upper limb: Secondary | ICD-10-CM | POA: Diagnosis not present

## 2023-08-24 DIAGNOSIS — Z79891 Long term (current) use of opiate analgesic: Secondary | ICD-10-CM | POA: Diagnosis not present

## 2023-08-24 DIAGNOSIS — M542 Cervicalgia: Secondary | ICD-10-CM | POA: Diagnosis not present

## 2023-08-24 DIAGNOSIS — G894 Chronic pain syndrome: Secondary | ICD-10-CM | POA: Diagnosis not present

## 2023-08-24 MED ORDER — METHADONE HCL 5 MG PO TABS
5.0000 mg | ORAL_TABLET | Freq: Four times a day (QID) | ORAL | 0 refills | Status: DC
  Filled 2023-08-24 (×2): qty 120, 30d supply, fill #0

## 2023-08-25 ENCOUNTER — Other Ambulatory Visit: Payer: Self-pay

## 2023-08-25 ENCOUNTER — Encounter: Payer: Self-pay | Admitting: Adult Health

## 2023-08-25 NOTE — Progress Notes (Signed)
Carelink Summary Report / Loop Recorder 

## 2023-08-27 ENCOUNTER — Other Ambulatory Visit: Payer: Self-pay

## 2023-08-27 MED ORDER — BENLYSTA 200 MG/ML ~~LOC~~ SOSY: PREFILLED_SYRINGE | SUBCUTANEOUS | 10 refills | Status: AC

## 2023-08-27 NOTE — Progress Notes (Signed)
Pharmacy Patient Advocate Encounter   Received notification from Patient Pharmacy that prior authorization for Benlysta is required/requested.   Insurance verification completed.   The patient is insured through [facility] ADVANTAGE/RX ADVANCE .   Per test claim: PA submitted via Fax. Status is pending.

## 2023-08-30 ENCOUNTER — Other Ambulatory Visit: Payer: Self-pay

## 2023-08-30 ENCOUNTER — Other Ambulatory Visit (HOSPITAL_COMMUNITY): Payer: Self-pay

## 2023-09-01 ENCOUNTER — Other Ambulatory Visit: Payer: Self-pay

## 2023-09-02 ENCOUNTER — Other Ambulatory Visit: Payer: Self-pay

## 2023-09-03 ENCOUNTER — Other Ambulatory Visit: Payer: Self-pay

## 2023-09-03 NOTE — Progress Notes (Signed)
Provider is not sure how they will proceed. They may try to appeal and will call to let me know if Benlysta can be filled. Dis-enrolling at this time

## 2023-09-03 NOTE — Progress Notes (Deleted)
Provider is not sure how they will proceed. They may try to appeal and will call to let me know if Benlysta can be filled. Dis-enrolling at this time

## 2023-09-03 NOTE — Progress Notes (Signed)
Pharmacy Patient Advocate Encounter  Received notification from [facility] [employer]VANT[employer]/RX ADVANCE that Prior Authorization for Jo [name] has been DENIED.  Provider will need to appeal or change medication

## 2023-09-06 ENCOUNTER — Ambulatory Visit: Payer: PPO

## 2023-09-09 ENCOUNTER — Encounter: Payer: Self-pay | Admitting: Family Medicine

## 2023-09-09 NOTE — Telephone Encounter (Signed)
 Care team updated and letter sent for eye exam notes.

## 2023-09-10 ENCOUNTER — Other Ambulatory Visit: Payer: Self-pay

## 2023-09-13 ENCOUNTER — Other Ambulatory Visit: Payer: Self-pay

## 2023-09-14 ENCOUNTER — Other Ambulatory Visit: Payer: Self-pay

## 2023-09-16 ENCOUNTER — Encounter: Payer: Self-pay | Admitting: Adult Health

## 2023-09-16 ENCOUNTER — Other Ambulatory Visit: Payer: Self-pay

## 2023-09-17 ENCOUNTER — Other Ambulatory Visit: Payer: Self-pay

## 2023-09-20 ENCOUNTER — Other Ambulatory Visit (HOSPITAL_COMMUNITY): Payer: Self-pay

## 2023-09-20 ENCOUNTER — Ambulatory Visit: Payer: Self-pay | Admitting: Family Medicine

## 2023-09-20 ENCOUNTER — Other Ambulatory Visit (HOSPITAL_BASED_OUTPATIENT_CLINIC_OR_DEPARTMENT_OTHER): Payer: Self-pay

## 2023-09-20 MED ORDER — HYDROMORPHONE HCL 4 MG PO TABS
4.0000 mg | ORAL_TABLET | Freq: Three times a day (TID) | ORAL | 0 refills | Status: DC | PRN
  Filled 2023-09-20: qty 60, 20d supply, fill #0

## 2023-09-20 MED ORDER — TIZANIDINE HCL 4 MG PO TABS
12.0000 mg | ORAL_TABLET | Freq: Every evening | ORAL | 1 refills | Status: DC
  Filled 2023-09-20: qty 270, 90d supply, fill #0
  Filled 2023-11-08: qty 90, 30d supply, fill #0
  Filled 2023-11-09: qty 270, 90d supply, fill #0
  Filled 2023-11-09: qty 90, 30d supply, fill #0
  Filled 2023-11-15 – 2023-12-07 (×3): qty 90, 30d supply, fill #1
  Filled 2023-12-27 – 2023-12-30 (×2): qty 90, 30d supply, fill #2
  Filled 2024-01-11 – 2024-02-04 (×3): qty 90, 30d supply, fill #3
  Filled 2024-02-23 – 2024-03-09 (×2): qty 90, 30d supply, fill #4

## 2023-09-20 MED ORDER — METHADONE HCL 5 MG PO TABS
5.0000 mg | ORAL_TABLET | Freq: Four times a day (QID) | ORAL | 0 refills | Status: DC
  Filled 2023-09-20 – 2023-09-24 (×2): qty 120, 30d supply, fill #0

## 2023-09-20 MED ORDER — TOPIRAMATE 25 MG PO TABS
ORAL_TABLET | ORAL | 1 refills | Status: DC
  Filled 2023-09-20 – 2024-08-21 (×4): qty 450, 90d supply, fill #0

## 2023-09-20 NOTE — Telephone Encounter (Signed)
Copied from CRM [id number]. Topic: Clinical - Pink Word Triage >> Sep 20, 2023  2:53 PM Chantha C wrote: Reason for Triage: Chest and head congestion, ears hurt, coughing green phelgm, fever not sure- device is broken, chills. Lupus pt and  RA in left lung, [phone]   Chief Complaint: Cough Symptoms: Cough with green phlegm, ears clogged, sore throat,  Frequency: Ongoing since Friday Pertinent Negatives: Patient denies breathing difficulty Disposition: [] ED /[] Urgent Care (no appt availability in office) / [x] Appointment(In office/virtual)/ []  [facility]/ [] Home Care/ [] Refused Recommended Disposition /[] [facility]/ []  Follow-up with PCP  Additional Notes: Patient has been having cough with green phlegm, ears clogged, and sore throat for the past 3 days. She has been using a humidifier and Mucinex and symptoms are persistent. Patient did a Covid test which came back negative. Appointment scheduled for tomorrow. Patient aware that the appt is at a different location than usual.  Reason for Disposition  [1] Continuous (nonstop) coughing interferes with work or school AND [2] no improvement using cough treatment per Care Advice  Answer Assessment - Initial Assessment Questions 1. ONSET: "When did the cough begin?"      Friday   2. SEVERITY: "How bad is the cough today?"      Pretty bad/constant  3. SPUTUM: "Describe the color of your sputum" (none, dry cough; clear, white, yellow, green)     Green  4. DIFFICULTY BREATHING: "Are you having difficulty breathing?" If Yes, ask: "How bad is it?" (e.g., mild, moderate, severe)    - MILD: No SOB at rest, mild SOB with walking, speaks normally in sentences, can lie down, no retractions, pulse < 100.    - MODERATE: SOB at rest, SOB with minimal exertion and prefers to sit, cannot lie down flat, speaks in phrases, mild retractions, audible wheezing, pulse 100-120.    - SEVERE: Very SOB at rest, speaks in single words,  struggling to breathe, sitting hunched forward, retractions, pulse > 120      Normal breathing   5. FEVER: "Do you have a fever?" If Yes, ask: "What is your temperature, how was it measured, and when did it start?"     Felt like had fever chills, but did not check temp  6. OTHER SYMPTOMS: "Do you have any other symptoms?" (e.g., runny nose, wheezing, chest pain)       Chest soreness in upper chest with cough, sore throat, runny nose and sometimes stuffed nose  Protocols used: Cough - Acute Productive-A-AH

## 2023-09-21 ENCOUNTER — Encounter: Payer: Self-pay | Admitting: Internal Medicine

## 2023-09-21 ENCOUNTER — Other Ambulatory Visit (HOSPITAL_COMMUNITY): Payer: Self-pay

## 2023-09-21 ENCOUNTER — Other Ambulatory Visit (HOSPITAL_BASED_OUTPATIENT_CLINIC_OR_DEPARTMENT_OTHER): Payer: Self-pay

## 2023-09-21 ENCOUNTER — Ambulatory Visit (INDEPENDENT_AMBULATORY_CARE_PROVIDER_SITE_OTHER): Payer: PPO | Admitting: Internal Medicine

## 2023-09-21 ENCOUNTER — Other Ambulatory Visit: Payer: Self-pay

## 2023-09-21 ENCOUNTER — Encounter: Payer: Self-pay | Admitting: Adult Health

## 2023-09-21 VITALS — BP 120/74 | HR 65 | Temp 98.4°F | Ht 64.0 in | Wt 170.0 lb

## 2023-09-21 DIAGNOSIS — R058 Other specified cough: Secondary | ICD-10-CM | POA: Diagnosis not present

## 2023-09-21 DIAGNOSIS — F419 Anxiety disorder, unspecified: Secondary | ICD-10-CM

## 2023-09-21 DIAGNOSIS — J302 Other seasonal allergic rhinitis: Secondary | ICD-10-CM | POA: Diagnosis not present

## 2023-09-21 MED ORDER — LEVOFLOXACIN 500 MG PO TABS
500.0000 mg | ORAL_TABLET | Freq: Every day | ORAL | 0 refills | Status: DC
Start: 2023-09-21 — End: 2023-10-20
  Filled 2023-09-21: qty 10, 10d supply, fill #0

## 2023-09-21 MED ORDER — HYDROCODONE BIT-HOMATROP MBR 5-1.5 MG/5ML PO SOLN
5.0000 mL | Freq: Four times a day (QID) | ORAL | 0 refills | Status: AC | PRN
  Filled 2023-09-21: qty 180, 9d supply, fill #0

## 2023-09-21 NOTE — Progress Notes (Signed)
 Patient ID: Jo [name], female   DOB: [birth year], 62 y.o.   MRN: [id number]        Chief Complaint: follow up prod cough       HPI:  Jo [name] is a 62 y.o. female Here with acute onset mild to mod 2-3 days ST, HA, general weakness and malaise, with prod cough greenish sputum, but Pt denies chest pain, increased sob or doe, wheezing, orthopnea, PND, increased LE swelling, palpitations, dizziness or syncope. .Does have several wks ongoing nasal allergy symptoms with clearish congestion, itch and sneezing, without fever, pain, ST, cough, swelling or wheezing.   Pt denies polydipsia, polyuria, or new focal neuro s/s.   Denies worsening depressive symptoms, suicidal ideation, or panic       Wt Readings from Last 3 Encounters:  09/21/23 170 lb (77.1 kg)  05/26/23 174 lb (78.9 kg)  04/14/23 174 lb (78.9 kg)   BP Readings from Last 3 Encounters:  09/21/23 120/74  05/26/23 110/72  04/14/23 120/84         Past Medical History:  Diagnosis Date   Allergy    Anemia    Anxiety    Anxiety and depression    Arrhythmia 2021   Arthritis    Asthma    Cervical cancer (HCC)    age 52    Chronic headaches    Colon polyp    COPD (chronic obstructive pulmonary disease) (HCC)    no o2   Depression    Dysrhythmia    Endometriosis    Fibromyalgia    Fundic gland polyps of stomach, benign    Gastritis    GERD (gastroesophageal reflux disease)    H. pylori infection 2013   H/O gastric ulcer 1985   History of anorexia nervosa    History of bulimia    History of pneumonia    Hypothyroidism    Irritable bowel syndrome (IBS)    Obesity    Personal history of other mental and behavioral disorders 01/14/2008   Qualifier: Diagnosis of  By: Avram MD, NOLIA Lupita BRAVO   Overview:  Overview:  Annotation: BULIMIA Qualifier: Diagnosis of  By: Josphine Penny Alexandro Olivia    PONV (postoperative nausea and vomiting)    iv phenergan  helps   REFLEX SYMPATHETIC DYSTROPHY 02/26/2009   Qualifier: Diagnosis of   By: Avram MD, NOLIA Lupita E    Rheumatoid arteritis (HCC)    RSD (reflex sympathetic dystrophy)    Sleep apnea    Systemic lupus (HCC) 2021   Ulcer    gastric, duodenal ulcer   Past Surgical History:  Procedure Laterality Date   CERVICAL SPINE SURGERY  1990   CERVIX SURGERY     CHOLECYSTECTOMY     COLONOSCOPY     HYSTEROSCOPY WITH D & C N/A 11/11/2021   Procedure: DILATATION AND CURETTAGE /HYSTEROSCOPY;  Surgeon: Cleatus Moccasin, MD;  Location: [facility] [place];  Service: Gynecology;  Laterality: N/A;   KNEE ARTHROSCOPY  04/21/2012   right   LAPAROSCOPIC NISSEN FUNDOPLICATION  09/05/2007   Dr. Donnice Lunger   laparoscopy     for endometriosis   LOOP RECORDER INSERTION Left 06/2021   lumbar spondylosis injected  2013   Dr. Cesario   NASAL SINUS SURGERY     THORACOSCOPY W/ THORACIC SYMPATHECTOMY     for RSD   TONSILLECTOMY     UPPER GASTROINTESTINAL ENDOSCOPY      reports that she has never smoked. She has never used smokeless tobacco.  She reports that she does not drink alcohol and does not use drugs. family history includes Breast cancer in her maternal aunt; Colon polyps in her brother, mother, and sister; Irritable bowel syndrome in her mother; Kidney Stones in her brother; Lung cancer in her maternal grandfather; Other in her mother; Raynaud syndrome in her mother; Stroke in her mother. Allergies  Allergen Reactions   Bee Pollen Anaphylaxis   Diazepam     sucidal depression   Prednisone Nausea And Vomiting    Per pt can not take oral steroids    Amoxicillin      Severe diarrhea At high dose    Emetrol     Other reaction(s): GI upset   Gabapentin     Doesn't remember; didn't tolerate   Imuran [Azathioprine]     Patient states this was discontinued due to liver problems   Methotrexate  Derivatives     diarrhea   Morphine     n & v   Propoxyphene N-Acetaminophen     n & V   Sulfa Antibiotics     MIGRAINES   Tetracyclines & Related     N & v    Propoxyphene Other (See Comments)   Current Outpatient Medications on File Prior to Visit  Medication Sig Dispense Refill   B-D 3CC LUER-LOK SYR 23GX1 23G X 1 3 ML MISC Use to inject B12 once a month 3 each 3   Belimumab  (BENLYSTA ) 200 MG/ML SOSY Inject 1 ml subcutaneously once a week 30 days. 4 mL 10   betamethasone  dipropionate 0.05 % cream Apply to the affectea area(s) twice daily as needed for knuckle flare ups 165 g 0   betamethasone  dipropionate 0.05 % lotion Apply topically as needed.     busPIRone  (BUSPAR ) 7.5 MG tablet Take 1 tablet (7.5 mg total) by mouth 2 (two) times daily. 180 tablet 1   cholecalciferol (VITAMIN D ) 1000 UNITS tablet Take 2,000 Units by mouth daily.     Cranberry 200 MG CAPS Take 200 mg by mouth 2 (two) times daily.     cyanocobalamin  (VITAMIN B12) 1000 MCG/ML injection Inject 1 mL (1,000 mcg total) into the muscle every 30 (thirty) days. 3 mL 1   cyanocobalamin  (VITAMIN B12) 1000 MCG/ML injection Inject 1 mL (1,000 mcg total) into the muscle every 30 (thirty) days. 3 mL 0   estradiol  (ESTRACE ) 2 MG tablet Take 1 tablet (2 mg total) by mouth daily. 90 tablet 2   fluticasone  (CUTIVATE ) 0.05 % cream Apply to the affected areas twice daily for 1 week, stop for 1 week, then repeat as needed for flare up. 60 g 2   folic acid  (FOLVITE ) 1 MG tablet Take 1 tablet (1 mg total) by mouth in the morning and at night. 60 tablet 2   HYDROmorphone  (DILAUDID ) 4 MG tablet Take by mouth as needed for severe pain.     HYDROmorphone  (DILAUDID ) 4 MG tablet Take 1 tablet (4 mg total) by mouth 3 (three) times daily as needed for pain 60 tablet 0   ketorolac  (TORADOL ) 30 MG/ML injection Inject 1 mL (30 mg total) into the muscle daily as needed for moderate pain. 5 mL 2   lamoTRIgine  (LAMICTAL ) 100 MG tablet Take 1 tablet (100 mg total) by mouth in the morning at breakfast and at bedtime. 180 tablet 1   lansoprazole  (PREVACID ) 30 MG capsule Take 1 capsule (30 mg total) by mouth 2 (two)  times daily before a meal. 180 capsule 2   levocetirizine (XYZAL )  5 MG tablet Take 1 tablet (5 mg total) by mouth every evening. 90 tablet 2   levothyroxine  (SYNTHROID ) 125 MCG tablet Take 1 tablet (125 mcg total) by mouth every morning. 90 tablet 2   LYSINE PO Take 1 tablet by mouth daily.     medroxyPROGESTERone  (PROVERA ) 2.5 MG tablet Take 1 tablet (2.5 mg total) by mouth every morning. 90 tablet 2   methadone  (DOLOPHINE ) 5 MG tablet Take 1 tablet by mouth 4 (four) times daily as needed.     methadone  (DOLOPHINE ) 5 MG tablet Take 1 tablet (5 mg total) by mouth every 6 (six) hours. 120 tablet 0   metoCLOPramide  (REGLAN ) 10 MG tablet Take 1 tablet (10 mg total) by mouth every 6 (six) hours as needed for nausea. 30 tablet 0   ondansetron  (ZOFRAN ) 4 MG tablet Take 4 mg by mouth 3 (three) times daily as needed.     ondansetron  (ZOFRAN ) 4 MG tablet Take 1 tablet (4 mg total) by mouth 3 (three) times daily as needed. 45 tablet 0   sertraline  (ZOLOFT ) 100 MG tablet Take 2 tablets (200 mg total) by mouth every morning. 180 tablet 2   TH FAMOTIDINE 10 PO Take 10 mg by mouth once a week. 1-2 tabs once weekly 80min-1hr prior to Benlysta  injection to aid with injection site rxn     tiZANidine  (ZANAFLEX ) 4 MG tablet Take 3 tablets (12 mg total) by mouth at bedtime. 270 tablet 1   tiZANidine  (ZANAFLEX ) 4 MG tablet Take 3 tablets (12 mg total) by mouth at bedtime. 270 tablet 1   topiramate  (TOPAMAX ) 25 MG tablet Take 1 tablet (25 mg total) by mouth every morning AND 4 tablets (100 mg total) at bedtime. 450 tablet 1   topiramate  (TOPAMAX ) 25 MG tablet Take 1 tablet (25 mg total) by mouth every morning AND 4 tablets (100 mg total) at bedtime. 450 tablet 0   topiramate  (TOPAMAX ) 25 MG tablet Take 1 tablet (25 mg total) by mouth every morning AND 4 tablets (100 mg total) at bedtime. 450 tablet 1   triamcinolone  (KENALOG ) 0.025 % cream Apply to entire nail bed twice daily for 2 weeks on and 1 week off, then repeat  cycle 80 g 0   triamcinolone  cream (KENALOG ) 0.1 % daily as needed.     No current facility-administered medications on file prior to visit.        ROS:  All others reviewed and negative.  Objective        PE:  BP 120/74 (BP Location: Right Arm, Patient Position: Sitting, Cuff Size: Normal)   Pulse 65   Temp 98.4 F (36.9 C) (Oral)   Ht 5' 4 (1.626 m)   Wt 170 lb (77.1 kg)   SpO2 98%   BMI 29.18 kg/m                 Constitutional: Pt appears mild ill               HENT: Head: NCAT.                Right Ear: External ear normal.                 Left Ear: External ear normal. Bilat tm's with mild erythema.  Max sinus areas non tender.  Pharynx with mild erythema, no exudate               Eyes: . Pupils are equal, round, and reactive to light.  Conjunctivae and EOM are normal               Nose: without d/c or deformity               Neck: Neck supple. Gross normal ROM               Cardiovascular: Normal rate and regular rhythm.                 Pulmonary/Chest: Effort normal and breath sounds without rales or wheezing.                Abd:  Soft, NT, ND, + BS, no organomegaly               Neurological: Pt is alert. At baseline orientation, motor grossly intact               Skin: Skin is warm. No rashes, no other new lesions, LE edema - none               Psychiatric: Pt behavior is normal without agitation   Micro: none  Cardiac tracings I have personally interpreted today:  none  Pertinent Radiological findings (summarize): none   Lab Results  Component Value Date   WBC 6.3 11/11/2022   HGB 13.6 11/11/2022   HCT 41.5 11/11/2022   PLT 299.0 11/11/2022   GLUCOSE 94 11/11/2022   CHOL 180 10/23/2021   TRIG 90.0 10/23/2021   HDL 85.30 10/23/2021   LDLCALC 76 10/23/2021   ALT 10 12/02/2021   AST 16 12/02/2021   NA 139 11/11/2022   K 4.6 11/11/2022   CL 106 11/11/2022   CREATININE 1.01 11/11/2022   BUN 14 11/11/2022   CO2 21 11/11/2022   TSH 0.63 11/11/2022    HGBA1C 5.6 11/15/2019   Assessment/Plan:  AFTAN VINT is a 62 y.o. White or Caucasian [1] female with  has a past medical history of Allergy, Anemia, Anxiety, Anxiety and depression, Arrhythmia (2021), Arthritis, Asthma, Cervical cancer (HCC), Chronic headaches, Colon polyp, COPD (chronic obstructive pulmonary disease) (HCC), Depression, Dysrhythmia, Endometriosis, Fibromyalgia, Fundic gland polyps of stomach, benign, Gastritis, GERD (gastroesophageal reflux disease), H. pylori infection (2013), H/O gastric ulcer (1985), History of anorexia nervosa, History of bulimia, History of pneumonia, Hypothyroidism, Irritable bowel syndrome (IBS), Obesity, Personal history of other mental and behavioral disorders (01/14/2008), PONV (postoperative nausea and vomiting), REFLEX SYMPATHETIC DYSTROPHY (02/26/2009), Rheumatoid arteritis (HCC), RSD (reflex sympathetic dystrophy), Sleep apnea, Systemic lupus (HCC) (2021), and Ulcer.  Productive cough Mild to mod, declines cxr, c/w broncitis vs pna, for antibx course augmentin  bid course, hycodan prn,,  to f/u any worsening symptoms or concerns  Seasonal allergies Mild to mod, for otc allegra 10 every day prn  to f/u any worsening symptoms or concerns  Anxiety disorder Mild situational worsening, for tx as above, declines need for other change in tx for now  Followup: Return if symptoms worsen or fail to improve.  Lynwood Rush, MD 09/21/2023 9:36 PM [facility]

## 2023-09-21 NOTE — Assessment & Plan Note (Signed)
Mild situational worsening, for tx as above, declines need for other change in tx for now

## 2023-09-21 NOTE — Assessment & Plan Note (Signed)
Mild to mod, for otc allegra 10 every day prn  to f/u any worsening symptoms or concerns

## 2023-09-21 NOTE — Patient Instructions (Signed)
 Please take all new medication as prescribed - the antibiotic, and the cough medicine  Given your autoimmune problems, you may wish to avoid further Covid booster shots that have been associated with numerous autoimmune issues   Please continue all other medications as before, and refills have been done if requested.  Please have the pharmacy call with any other refills you may need.  Please keep your appointments with your specialists as you may have planned

## 2023-09-21 NOTE — Assessment & Plan Note (Signed)
Mild to mod, declines cxr, c/w broncitis vs pna, for antibx course augmentin bid course, hycodan prn,,  to f/u any worsening symptoms or concerns

## 2023-09-24 ENCOUNTER — Other Ambulatory Visit (HOSPITAL_COMMUNITY): Payer: Self-pay

## 2023-09-24 ENCOUNTER — Other Ambulatory Visit: Payer: Self-pay

## 2023-09-27 ENCOUNTER — Telehealth (INDEPENDENT_AMBULATORY_CARE_PROVIDER_SITE_OTHER): Payer: PPO | Admitting: Family Medicine

## 2023-09-27 ENCOUNTER — Ambulatory Visit: Payer: Self-pay | Admitting: Family Medicine

## 2023-09-27 DIAGNOSIS — J45901 Unspecified asthma with (acute) exacerbation: Secondary | ICD-10-CM | POA: Diagnosis not present

## 2023-09-27 DIAGNOSIS — R21 Rash and other nonspecific skin eruption: Secondary | ICD-10-CM | POA: Diagnosis not present

## 2023-09-27 MED ORDER — NYSTATIN 100000 UNIT/GM EX CREA: 1.0000 | TOPICAL_CREAM | Freq: Two times a day (BID) | CUTANEOUS | 0 refills | Status: AC

## 2023-09-27 MED ORDER — ALBUTEROL SULFATE HFA 108 (90 BASE) MCG/ACT IN AERS: 2.0000 | INHALATION_SPRAY | Freq: Four times a day (QID) | RESPIRATORY_TRACT | 0 refills | Status: DC | PRN

## 2023-09-27 MED ORDER — KETOCONAZOLE 1 % EX SHAM: MEDICATED_SHAMPOO | CUTANEOUS | Status: AC

## 2023-09-27 NOTE — Progress Notes (Signed)
 Virtual Visit via Video Note  I connected with Jo [name] on 09/27/2023 at 11:00 AM EST by a video enabled telemedicine application and verified that I am speaking with the correct person using two identifiers.  Location: Patient: home Provider: office   I discussed the limitations of evaluation and management by telemedicine and the availability of in person appointments. The patient expressed understanding and agreed to proceed.  History of Present Illness:  Chief Complaint  Patient presents with   Rash    Rash under right breast. Has been on antibiotic due to upper respiratory. Believes its from medication.    History of Present Illness (HPI):  Rash under right breast:  Patient reports a rash under her left breast for the past several days. She notes that the rash is red, raised, and diffusely distributed in the area beneath the left breast. The patient has experienced similar rashes in the past, which responded well to Nystatin  cream. She has been applying Nystatin  cream she had previously, which has provided some relief. She also mentions using Head and Shoulders shampoo to wash the area, but the rash persists. She reports experiencing sweating and irritation in the area. There is no associated pus, drainage, or fever. The patient denies any history of allergic reactions to levofloxacin , which she is currently taking without any issues.  Respiratory symptoms:  Patient reports a recent respiratory illness for which she was prescribed levofloxacin  starting on September 21, 2023, for presumed pneumonia or bronchitis. She has been taking the medication as directed and notes improvement in her symptoms. She expresses concern about lingering respiratory issues and inquires about obtaining an albuterol  inhaler for symptomatic relief. She does not report current shortness of breath but wishes to have an inhaler available as needed.      Observations/Objective: There were no vitals filed  for this visit.   Gen: NAD, resting comfortably HEENT: EOMI Pulm: NWOB Skin: erythema patch under right breast, no observed drainage Neuro: no facial asymmetry or dysmetria Psych: Normal affect   Assessment and Plan: Problem List Items Addressed This Visit       Respiratory   Asthma with acute exacerbation   H/o asthma. Without albuterol . Exacerbation likely due to recent respiratory illness.  Plan:  Provide prescription for albuterol  inhaler to be used as needed for relief of bronchospasm.  Advise patient to complete the full course of levofloxacin  as prescribed.  Encourage patient to monitor symptoms and seek in-person evaluation if respiratory symptoms persist or worsen      Relevant Medications   albuterol  (VENTOLIN  HFA) 108 (90 Base) MCG/ACT inhaler     Musculoskeletal and Integument   Rash under right breast - Primary   Intertrigo - Most likely due to location in skin folds, associated sweating and irritation, and previous response to Nystatin .  Plan: Prescribe Nystatin  cream to be applied twice daily to the affected area for 15 days until resolved.  Recommend use over-the-counter ketoconazole  1% shampoo (Nizoral ) twice a week as needed for maintenance.  Encourage keeping the area clean and dry to reduce moisture and friction.  Advised patient to monitor for any signs of infection, such as increased redness, warmth, swelling, or drainage, and to seek in-person evaluation if symptoms worsen or do not improve.      Relevant Medications   nystatin  cream (MYCOSTATIN )   KETOCONAZOLE , TOPICAL, 1 % SHAM   albuterol  (VENTOLIN  HFA) 108 (90 Base) MCG/ACT inhaler    There are no discontinued medications.   Follow Up Instructions:  No follow-ups on file.    I discussed the assessment and treatment plan with the patient. The patient was provided an opportunity to ask questions and all were answered. The patient agreed with the plan and demonstrated an understanding of the  instructions.   The patient was advised to call back or seek an in-person evaluation if the symptoms worsen or if the condition fails to improve as anticipated.  Jo KATHEE Hummer, MD

## 2023-09-27 NOTE — Assessment & Plan Note (Signed)
 H/o asthma. Without albuterol . Exacerbation likely due to recent respiratory illness.  Plan:  Provide prescription for albuterol  inhaler to be used as needed for relief of bronchospasm.  Advise patient to complete the full course of levofloxacin  as prescribed.  Encourage patient to monitor symptoms and seek in-person evaluation if respiratory symptoms persist or worsen

## 2023-09-27 NOTE — Telephone Encounter (Signed)
 Copied from CRM [id number]. Topic: Clinical - Pink Word Triage >> Sep 27, 2023  9:39 AM Corin V wrote: Reason for Triage: Patient has been taking antibiotics for a cough and respiratory issues and she feels this may have caused a yeast or fungal infection under her breast due to her Lupus and autoimmune disorders. It is slightly rashy and was puffy yesterday. She applied a nystatin  cream to it and the puffiness/swelling reduced overnight. She does not know if she needs to be seen or if an Rx can be called in.   Chief Complaint: Rash Symptoms: Painful rash under right breast  Frequency: Constant  Disposition: [] ED /[] Urgent Care (no appt availability in office) / [x] Appointment(In office/virtual)/ []  [facility]/ [] Home Care/ [] Refused Recommended Disposition /[] [facility]/ []  Follow-up with PCP Additional Notes: Patient reports that she is on an antibiotic for a cough and yesterday she developed a rash under her right breast. She reports that the rash is raised, painful, and has an odor to it, believing it may be a yeast or fungal infection. Patient states she used Nystatin  cream which improved the swelling and pain. She states she would like to see if she can get a prescription for the rash. Virtual appointment made for the patient for today.    Reason for Disposition  [1] Localized rash is very painful AND [2] no fever  Answer Assessment - Initial Assessment Questions 1. APPEARANCE of RASH: Describe the rash.      Puffy raised rash 2. LOCATION: Where is the rash located?      Right breast  3. NUMBER: How many spots are there?      One  4. SIZE: How big are the spots? (Inches, centimeters or compare to size of a coin)      Tea saucer sized  5. ONSET: When did the rash start?      Last night  6. ITCHING: Does the rash itch? If Yes, ask: How bad is the itch?  (Scale 0-10; or none, mild, moderate, severe)     No 7. PAIN: Does the rash hurt? If Yes,  ask: How bad is the pain?  (Scale 0-10; or none, mild, moderate, severe)    - NONE (0): no pain    - MILD (1-3): doesn't interfere with normal activities     - MODERATE (4-7): interferes with normal activities or awakens from sleep     - SEVERE (8-10): excruciating pain, unable to do any normal activities     Mild 8. OTHER SYMPTOMS: Do you have any other symptoms? (e.g., fever)     Odor from area  9. PREGNANCY: Is there any chance you are pregnant? When was your last menstrual period?     No  Protocols used: Rash or Redness - Localized-A-AH

## 2023-09-27 NOTE — Assessment & Plan Note (Addendum)
 Intertrigo - Most likely due to location in skin folds, associated sweating and irritation, and previous response to Nystatin .  Plan: Prescribe Nystatin  cream to be applied twice daily to the affected area for 15 days until resolved.  Recommend use over-the-counter ketoconazole  1% shampoo (Nizoral ) twice a week as needed for maintenance.  Encourage keeping the area clean and dry to reduce moisture and friction.  Advised patient to monitor for any signs of infection, such as increased redness, warmth, swelling, or drainage, and to seek in-person evaluation if symptoms worsen or do not improve.

## 2023-09-27 NOTE — Telephone Encounter (Signed)
 Noted.

## 2023-09-28 ENCOUNTER — Telehealth: Payer: Self-pay

## 2023-09-28 NOTE — Telephone Encounter (Signed)
 Pt seen by Dr. Norleen  Copied from CRM [id number]. Topic: Clinical - Medical Advice >> Sep 28, 2023  3:44 PM Jo [name] wrote: Reason for CRM: Patient called said she think she messed up on her antibiotic levofloxacin  (LEVAQUIN ) 500 MG tablet [[id number]], she said she did not know it cannot be taken with dairy and she's been putting dairy in her coffee. Would like for provider to send another prescription because she still is feeling sick. Requesting a call back if it's possible.

## 2023-09-28 NOTE — Telephone Encounter (Signed)
 No, I can't imagine that this kind of dairy in the coffee would be enough to be a problem, so I think we can hold on further levaquin,  thanks

## 2023-09-29 ENCOUNTER — Ambulatory Visit: Payer: PPO | Admitting: Family Medicine

## 2023-09-30 NOTE — Telephone Encounter (Signed)
 I have to decline as this seems extremely unlikely to have been a significant effect and problem .   thanks

## 2023-10-07 ENCOUNTER — Ambulatory Visit (INDEPENDENT_AMBULATORY_CARE_PROVIDER_SITE_OTHER): Payer: PPO

## 2023-10-07 DIAGNOSIS — R55 Syncope and collapse: Secondary | ICD-10-CM

## 2023-10-07 LAB — CUP PACEART REMOTE DEVICE CHECK
Date Time Interrogation Session: 20250115230652
Implantable Pulse Generator Implant Date: 20221006

## 2023-10-10 ENCOUNTER — Encounter: Payer: Self-pay | Admitting: Cardiology

## 2023-10-11 ENCOUNTER — Other Ambulatory Visit: Payer: Self-pay

## 2023-10-13 ENCOUNTER — Other Ambulatory Visit: Payer: Self-pay

## 2023-10-13 ENCOUNTER — Encounter: Payer: Self-pay | Admitting: Adult Health

## 2023-10-14 ENCOUNTER — Encounter: Payer: Self-pay | Admitting: Adult Health

## 2023-10-14 ENCOUNTER — Other Ambulatory Visit: Payer: Self-pay

## 2023-10-18 ENCOUNTER — Other Ambulatory Visit: Payer: Self-pay

## 2023-10-19 ENCOUNTER — Other Ambulatory Visit: Payer: Self-pay | Admitting: Family Medicine

## 2023-10-19 DIAGNOSIS — J45901 Unspecified asthma with (acute) exacerbation: Secondary | ICD-10-CM

## 2023-10-19 DIAGNOSIS — R21 Rash and other nonspecific skin eruption: Secondary | ICD-10-CM

## 2023-10-20 ENCOUNTER — Encounter: Payer: Self-pay | Admitting: Family Medicine

## 2023-10-20 ENCOUNTER — Ambulatory Visit (INDEPENDENT_AMBULATORY_CARE_PROVIDER_SITE_OTHER): Payer: PPO | Admitting: Family Medicine

## 2023-10-20 ENCOUNTER — Ambulatory Visit: Payer: PPO

## 2023-10-20 ENCOUNTER — Other Ambulatory Visit (HOSPITAL_BASED_OUTPATIENT_CLINIC_OR_DEPARTMENT_OTHER): Payer: Self-pay

## 2023-10-20 VITALS — BP 110/70 | HR 62 | Temp 98.5°F | Resp 16 | Ht 64.0 in | Wt 171.5 lb

## 2023-10-20 DIAGNOSIS — R058 Other specified cough: Secondary | ICD-10-CM | POA: Diagnosis not present

## 2023-10-20 DIAGNOSIS — M069 Rheumatoid arthritis, unspecified: Secondary | ICD-10-CM | POA: Diagnosis not present

## 2023-10-20 DIAGNOSIS — R053 Chronic cough: Secondary | ICD-10-CM | POA: Diagnosis not present

## 2023-10-20 DIAGNOSIS — J45991 Cough variant asthma: Secondary | ICD-10-CM | POA: Diagnosis not present

## 2023-10-20 MED ORDER — BUDESONIDE-FORMOTEROL FUMARATE 160-4.5 MCG/ACT IN AERO
2.0000 | INHALATION_SPRAY | Freq: Two times a day (BID) | RESPIRATORY_TRACT | 0 refills | Status: DC
  Filled 2023-10-20 – 2023-10-26 (×3): qty 10.2, 30d supply, fill #0

## 2023-10-20 MED ORDER — BUDESONIDE-FORMOTEROL FUMARATE 160-4.5 MCG/ACT IN AERO
2.0000 | INHALATION_SPRAY | Freq: Two times a day (BID) | RESPIRATORY_TRACT | 0 refills | Status: DC
  Filled 2023-10-20: qty 1, fill #0

## 2023-10-20 NOTE — Progress Notes (Signed)
ACUTE VISIT Chief Complaint  Patient presents with   Cough   HPI: Jo [name] is a 63 y.o. female with a PMHx significant for seasonal allergies, SVT, SLE, reflex sympathetic dystrophy/complex regional pain syndrome I, chronic pain, GERD, migraine headaches, fibromyalgia, depression, and anxiety, who is here today complaining of productive cough.  Patient complains of productive cough for about a month.   Cough This is a chronic problem. The current episode started 1 to 4 weeks ago. The problem has been gradually improving. The problem occurs every few hours. The cough is Productive of sputum. Associated symptoms include nasal congestion, postnasal drip and rhinorrhea. Pertinent negatives include no chest pain, chills, ear congestion, ear pain, fever, headaches, heartburn, hemoptysis, rash, shortness of breath, sweats or weight loss. She has tried a beta-agonist inhaler, prescription cough suppressant and OTC cough suppressant for the symptoms. The treatment provided mild relief. Her past medical history is significant for asthma and environmental allergies.   She is also having crackling and wheezing, nasal congestion, and postnasal drainage.  Also says she had some "crackling", but that has resolved.  She states she has a lot of postnasal and nasal mucus, especially after eating.   Denies any heartburn or sore throat.   GERD on Prevacid 30 mg daily.  Has a history of asthma and chronic cough.   She was seen by Dr. Oliver Barre on 09/21/2023 and prescribed levofloxacin 500 mg daily for 10 days, which she has completed.  Also has been taking Mucinex DM, running a humidifier, and using her albuterol 108 (90 base) mcg/act inhaler.   She also mentions joint edema and stiffness. RA and SLE: She is still following with her rheumatologist.   Review of Systems  Constitutional:  Negative for chills, fever and weight loss.  HENT:  Positive for postnasal drip and rhinorrhea. Negative for ear  pain.   Respiratory:  Positive for cough. Negative for hemoptysis and shortness of breath.   Cardiovascular:  Negative for chest pain.  Gastrointestinal:  Negative for abdominal pain, heartburn, nausea and vomiting.  Genitourinary:  Negative for decreased urine volume, dysuria and hematuria.  Skin:  Negative for rash.  Allergic/Immunologic: Positive for environmental allergies.  Neurological:  Negative for syncope and headaches.  See other pertinent positives and negatives in HPI.  Current Outpatient Medications on File Prior to Visit  Medication Sig Dispense Refill   albuterol (VENTOLIN HFA) 108 (90 Base) MCG/ACT inhaler TAKE 2 PUFFS BY MOUTH EVERY 6 HOURS AS NEEDED FOR WHEEZE OR SHORTNESS OF BREATH 8.5 each 0   B-D 3CC LUER-LOK SYR 23GX1" 23G X 1" 3 ML MISC Use to inject B12 once a month 3 each 3   Belimumab (BENLYSTA) 200 MG/ML SOSY Inject 1 ml subcutaneously once a week 30 days. 4 mL 10   betamethasone dipropionate 0.05 % cream Apply to the affectea area(s) twice daily as needed for knuckle flare ups 165 g 0   betamethasone dipropionate 0.05 % lotion Apply topically as needed.     busPIRone (BUSPAR) 7.5 MG tablet Take 1 tablet (7.5 mg total) by mouth 2 (two) times daily. 180 tablet 1   cholecalciferol (VITAMIN D) 1000 UNITS tablet Take 2,000 Units by mouth daily.     Cranberry 200 MG CAPS Take 200 mg by mouth 2 (two) times daily.     cyanocobalamin (VITAMIN B12) 1000 MCG/ML injection Inject 1 mL (1,000 mcg total) into the muscle every 30 (thirty) days. 3 mL 1   cyanocobalamin (VITAMIN B12) 1000  MCG/ML injection Inject 1 mL (1,000 mcg total) into the muscle every 30 (thirty) days. 3 mL 0   estradiol (ESTRACE) 2 MG tablet Take 1 tablet (2 mg total) by mouth daily. 90 tablet 2   fluticasone (CUTIVATE) 0.05 % cream Apply to the affected areas twice daily for 1 week, stop for 1 week, then repeat as needed for flare up. 60 g 2   folic acid (FOLVITE) 1 MG tablet Take 1 tablet (1 mg total) by  mouth in the morning and at night. 60 tablet 2   HYDROmorphone (DILAUDID) 4 MG tablet Take by mouth as needed for severe pain.     HYDROmorphone (DILAUDID) 4 MG tablet Take 1 tablet (4 mg total) by mouth 3 (three) times daily as needed for pain 60 tablet 0   KETOCONAZOLE, TOPICAL, 1 % SHAM Use up to twice a week as needed     ketorolac (TORADOL) 30 MG/ML injection Inject 1 mL (30 mg total) into the muscle daily as needed for moderate pain. 5 mL 2   lamoTRIgine (LAMICTAL) 100 MG tablet Take 1 tablet (100 mg total) by mouth in the morning at breakfast and at bedtime. 180 tablet 1   lansoprazole (PREVACID) 30 MG capsule Take 1 capsule (30 mg total) by mouth 2 (two) times daily before a meal. 180 capsule 2   levocetirizine (XYZAL) 5 MG tablet Take 1 tablet (5 mg total) by mouth every evening. 90 tablet 2   levothyroxine (SYNTHROID) 125 MCG tablet Take 1 tablet (125 mcg total) by mouth every morning. 90 tablet 2   LYSINE PO Take 1 tablet by mouth daily.     medroxyPROGESTERone (PROVERA) 2.5 MG tablet Take 1 tablet (2.5 mg total) by mouth every morning. 90 tablet 2   methadone (DOLOPHINE) 5 MG tablet Take 1 tablet by mouth 4 (four) times daily as needed.     methadone (DOLOPHINE) 5 MG tablet Take 1 tablet (5 mg total) by mouth every 6 (six) hours. 120 tablet 0   metoCLOPramide (REGLAN) 10 MG tablet Take 1 tablet (10 mg total) by mouth every 6 (six) hours as needed for nausea. 30 tablet 0   ondansetron (ZOFRAN) 4 MG tablet Take 4 mg by mouth 3 (three) times daily as needed.     ondansetron (ZOFRAN) 4 MG tablet Take 1 tablet (4 mg total) by mouth 3 (three) times daily as needed. 45 tablet 0   sertraline (ZOLOFT) 100 MG tablet Take 2 tablets (200 mg total) by mouth every morning. 180 tablet 2   TH FAMOTIDINE 10 PO Take 10 mg by mouth once a week. 1-2 tabs once weekly 50min-1hr prior to Benlysta injection to aid with injection site rxn     tiZANidine (ZANAFLEX) 4 MG tablet Take 3 tablets (12 mg total) by  mouth at bedtime. 270 tablet 1   tiZANidine (ZANAFLEX) 4 MG tablet Take 3 tablets (12 mg total) by mouth at bedtime. 270 tablet 1   topiramate (TOPAMAX) 25 MG tablet Take 1 tablet (25 mg total) by mouth every morning AND 4 tablets (100 mg total) at bedtime. 450 tablet 1   topiramate (TOPAMAX) 25 MG tablet Take 1 tablet (25 mg total) by mouth every morning AND 4 tablets (100 mg total) at bedtime. 450 tablet 0   topiramate (TOPAMAX) 25 MG tablet Take 1 tablet (25 mg total) by mouth every morning AND 4 tablets (100 mg total) at bedtime. 450 tablet 1   triamcinolone (KENALOG) 0.025 % cream Apply to entire  nail bed twice daily for 2 weeks on and 1 week off, then repeat cycle 80 g 0   triamcinolone cream (KENALOG) 0.1 % daily as needed.     No current facility-administered medications on file prior to visit.    Past Medical History:  Diagnosis Date   Allergy    Anemia    Anxiety    Anxiety and depression    Arrhythmia 2021   Arthritis    Asthma    Cervical cancer [facility])    age 74    Chronic headaches    Colon polyp    COPD (chronic obstructive pulmonary disease) (HCC)    no o2   Depression    Dysrhythmia    Endometriosis    Fibromyalgia    Fundic gland polyps of stomach, benign    Gastritis    GERD (gastroesophageal reflux disease)    H. pylori infection 2013   H/O gastric ulcer 1985   History of anorexia nervosa    History of bulimia    History of pneumonia    Hypothyroidism    Irritable bowel syndrome (IBS)    Obesity    Personal history of other mental and behavioral disorders 01/14/2008   Qualifier: Diagnosis of  By: Leone Payor MD, Charlyne Quale   Overview:  Overview:  Annotation: BULIMIA Qualifier: Diagnosis of  By: Davonna Belling    PONV (postoperative nausea and vomiting)    iv phenergan helps   REFLEX SYMPATHETIC DYSTROPHY 02/26/2009   Qualifier: Diagnosis of  By: Leone Payor MD, Charlyne Quale    Rheumatoid arteritis (HCC)    RSD (reflex sympathetic dystrophy)     Sleep apnea    Systemic lupus (HCC) 2021   Ulcer    gastric, duodenal ulcer   Allergies  Allergen Reactions   Bee Pollen Anaphylaxis   Diazepam     sucidal depression   Prednisone Nausea And Vomiting    Per pt can not take oral steroids    Amoxicillin     Severe diarrhea At high dose    Emetrol     Other reaction(s): GI upset   Gabapentin     Doesn't remember; didn't tolerate   Imuran [Azathioprine]     Patient states this was discontinued due to liver problems   Methotrexate Derivatives     diarrhea   Morphine     n & v   Propoxyphene N-Acetaminophen     n & V   Sulfa Antibiotics     MIGRAINES   Tetracyclines & Related     N & v   Propoxyphene Other (See Comments)    Social History   Socioeconomic History   Marital status: Divorced    Spouse name: Not on file   Number of children: 0   Years of education: 16   Highest education level: Bachelor's degree (e.g., BA, AB, BS)  Occupational History   Occupation: Public house manager: UNEMPLOYED  Tobacco Use   Smoking status: Never   Smokeless tobacco: Never  Vaping Use   Vaping status: Never Used  Substance and Sexual Activity   Alcohol use: No   Drug use: No   Sexual activity: Not Currently    Birth control/protection: None  Other Topics Concern   Not on file  Social History Narrative   Lives at home alone.   Caffeine use: Drinks coffee- 1 cup in morning   Ginger ale   Social Drivers of Health   Financial Resource Strain:  Low Risk  (09/20/2023)   Overall Financial Resource Strain (CARDIA)    Difficulty of Paying Living Expenses: Not very hard  Food Insecurity: No Food Insecurity (11/10/2022)   Hunger Vital Sign    Worried About Running Out of Food in the Last Year: Never true    Ran Out of Food in the Last Year: Never true  Transportation Needs: No Transportation Needs (09/20/2023)   PRAPARE - Administrator, Civil Service (Medical): No    Lack of Transportation  (Non-Medical): No  Physical Activity: Unknown (09/20/2023)   Exercise Vital Sign    Days of Exercise per Week: 0 days    Minutes of Exercise per Session: Not on file  Stress: No Stress Concern Present (09/20/2023)   [employer] of Occupational Health - Occupational Stress Questionnaire    Feeling of Stress : Only a little  Social Connections: Moderately Integrated (09/20/2023)   Social Connection and Isolation Panel [NHANES]    Frequency of Communication with Friends and Family: More than three times a week    Frequency of Social Gatherings with Friends and Family: Once a week    Attends Religious Services: More than 4 times per year    Active Member of [employer] or Organizations: Yes    Attends Banker Meetings: More than 4 times per year    Marital Status: Divorced    Vitals:   10/20/23 1131  BP: 110/70  Pulse: 62  Resp: 16  Temp: 98.5 F (36.9 C)  SpO2: 97%   Body mass index is 29.44 kg/m.  Physical Exam Vitals and nursing note reviewed.  Constitutional:      General: She is not in acute distress.    Appearance: She is well-developed. She is not ill-appearing.  HENT:     Head: Normocephalic and atraumatic.     Right Ear: Tympanic membrane, ear canal and external ear normal.     Left Ear: Tympanic membrane, ear canal and external ear normal.     Nose: Rhinorrhea present. No congestion.     Mouth/Throat:     Mouth: Mucous membranes are moist.     Pharynx: Uvula midline. Postnasal drip present.  Eyes:     Conjunctiva/sclera: Conjunctivae normal.  Cardiovascular:     Rate and Rhythm: Normal rate and regular rhythm.     Heart sounds: No murmur heard. Pulmonary:     Effort: Pulmonary effort is normal. No respiratory distress.     Breath sounds: Normal breath sounds. No stridor.     Comments: A productive cough a few times during her visit. Musculoskeletal:     Right lower leg: No edema.     Left lower leg: No edema.     Comments: No signs of  synovitis.  Lymphadenopathy:     Cervical: No cervical adenopathy.  Skin:    General: Skin is warm.     Findings: No erythema or rash.  Neurological:     Mental Status: She is alert and oriented to person, place, and time.  Psychiatric:        Mood and Affect: Mood and affect normal.    ASSESSMENT AND PLAN:  Jo [name] was seen today for productive cough.   Cough variant asthma This problem could be contributing to persistent cough. Recommend starting Symbicort 160-4.5 mcg 2 puff twice daily, we discussed some side effects.  Instructed to rinse after use. Continue albuterol inhaler every 4-6 hours as needed for wheezing. Follow-up in 2 weeks if symptoms have  not improved.  -     Budesonide-Formoterol Fumarate; Inhale 2 puffs into the lungs 2 (two) times daily.  Dispense: 10.2 g; Refill: 0  Persistent cough for 3 weeks or longer Lung auscultation today negative, I do not appreciate wheezing either. Completed 10 days of levofloxacin. Some of her chronic comorbidities could be aggravating factors. Because of persistent cough,more than her chronic cough for > 3 weeks, recommend chest x-ray. OTC plain Mucinex may help.  -     DG Chest 2 View; Future  Rheumatoid arthritis, involving unspecified site, unspecified whether rheumatoid factor present (HCC) As well as SLE, currently on belimumab. She follows with rheumatology regularly.  Return in about 2 weeks (around 11/03/2023), or if symptoms worsen or fail to improve.  I, Rolla Etienne Wierda, acting as a scribe for Daymian Lill [place], MD., have documented all relevant documentation on the behalf of Datha Kissinger [place], MD, as directed by  Mana Morison [place], MD while in the presence of Latiffany Harwick [place], MD.   I, Maui Britten [place], MD, have reviewed all documentation for this visit. The documentation on 10/20/23 for the exam, diagnosis, procedures, and orders are all accurate and complete.  Borden Thune G. [place], MD  [facility] [place] office.

## 2023-10-20 NOTE — Patient Instructions (Addendum)
A few things to remember from today's visit:  Cough variant asthma - Plan: budesonide-formoterol (SYMBICORT) 160-4.5 MCG/ACT inhaler  Persistent cough for 3 weeks or longer - Plan: DG Chest 2 View  Start Symbicort today, rinse after use. May benefit from using med with a spacer or chamber. Monitor for fever. Plain Mucinex over the counter.  If you need refills for medications you take chronically, please call your pharmacy. Do not use My Chart to request refills or for acute issues that need immediate attention. If you send a my chart message, it may take a few days to be addressed, specially if I am not in the office.  Please be sure medication list is accurate. If a new problem present, please set up appointment sooner than planned today.

## 2023-10-26 ENCOUNTER — Other Ambulatory Visit (HOSPITAL_BASED_OUTPATIENT_CLINIC_OR_DEPARTMENT_OTHER): Payer: Self-pay

## 2023-10-26 ENCOUNTER — Other Ambulatory Visit (HOSPITAL_COMMUNITY): Payer: Self-pay

## 2023-10-26 ENCOUNTER — Encounter (HOSPITAL_COMMUNITY): Payer: Self-pay

## 2023-10-26 ENCOUNTER — Encounter: Payer: Self-pay | Admitting: Cardiology

## 2023-10-26 ENCOUNTER — Encounter: Payer: Self-pay | Admitting: Family Medicine

## 2023-10-26 DIAGNOSIS — G90512 Complex regional pain syndrome I of left upper limb: Secondary | ICD-10-CM | POA: Diagnosis not present

## 2023-10-26 DIAGNOSIS — M542 Cervicalgia: Secondary | ICD-10-CM | POA: Diagnosis not present

## 2023-10-26 DIAGNOSIS — G894 Chronic pain syndrome: Secondary | ICD-10-CM | POA: Diagnosis not present

## 2023-10-26 DIAGNOSIS — Z79891 Long term (current) use of opiate analgesic: Secondary | ICD-10-CM | POA: Diagnosis not present

## 2023-10-26 MED ORDER — METHADONE HCL 5 MG PO TABS
5.0000 mg | ORAL_TABLET | Freq: Four times a day (QID) | ORAL | 0 refills | Status: DC
  Filled 2023-10-26: qty 120, 30d supply, fill #0

## 2023-10-27 ENCOUNTER — Other Ambulatory Visit (HOSPITAL_BASED_OUTPATIENT_CLINIC_OR_DEPARTMENT_OTHER): Payer: Self-pay

## 2023-10-27 ENCOUNTER — Other Ambulatory Visit (HOSPITAL_COMMUNITY): Payer: Self-pay

## 2023-10-27 ENCOUNTER — Other Ambulatory Visit: Payer: Self-pay | Admitting: Family Medicine

## 2023-10-27 ENCOUNTER — Other Ambulatory Visit: Payer: Self-pay

## 2023-10-27 MED ORDER — FLUTICASONE-SALMETEROL 115-21 MCG/ACT IN AERO
2.0000 | INHALATION_SPRAY | Freq: Two times a day (BID) | RESPIRATORY_TRACT | 1 refills | Status: AC
  Filled 2023-10-27: qty 12, 30d supply, fill #0
  Filled 2024-08-21: qty 12, 30d supply, fill #1

## 2023-10-28 ENCOUNTER — Other Ambulatory Visit: Payer: Self-pay

## 2023-10-28 ENCOUNTER — Other Ambulatory Visit (HOSPITAL_COMMUNITY): Payer: Self-pay

## 2023-10-29 ENCOUNTER — Encounter: Payer: Self-pay | Admitting: Family Medicine

## 2023-11-01 ENCOUNTER — Other Ambulatory Visit (HOSPITAL_COMMUNITY): Payer: Self-pay

## 2023-11-01 ENCOUNTER — Other Ambulatory Visit: Payer: Self-pay | Admitting: Family Medicine

## 2023-11-01 ENCOUNTER — Other Ambulatory Visit: Payer: Self-pay

## 2023-11-01 DIAGNOSIS — F341 Dysthymic disorder: Secondary | ICD-10-CM

## 2023-11-01 MED ORDER — FOLIC ACID 1 MG PO TABS
1.0000 mg | ORAL_TABLET | Freq: Two times a day (BID) | ORAL | 2 refills | Status: DC
  Filled 2023-11-18 (×3): qty 60, 30d supply, fill #0
  Filled 2023-11-25 – 2023-12-07 (×3): qty 60, 30d supply, fill #1
  Filled 2023-12-27 – 2023-12-30 (×2): qty 60, 30d supply, fill #2

## 2023-11-01 MED ORDER — LAMOTRIGINE 100 MG PO TABS
100.0000 mg | ORAL_TABLET | Freq: Two times a day (BID) | ORAL | 2 refills | Status: DC
  Filled 2023-11-08: qty 60, 30d supply, fill #0
  Filled 2023-11-09: qty 180, 90d supply, fill #0
  Filled 2023-11-09: qty 60, 30d supply, fill #0
  Filled 2023-11-15 – 2023-12-07 (×3): qty 60, 30d supply, fill #1
  Filled 2023-12-27 – 2023-12-30 (×2): qty 60, 30d supply, fill #2
  Filled 2024-01-11 – 2024-02-04 (×3): qty 60, 30d supply, fill #3
  Filled 2024-02-23 – 2024-03-09 (×3): qty 60, 30d supply, fill #4
  Filled 2024-04-11: qty 60, 30d supply, fill #5
  Filled 2024-04-27 – 2024-05-04 (×3): qty 60, 30d supply, fill #6
  Filled 2024-06-09 – 2024-06-13 (×2): qty 60, 30d supply, fill #7
  Filled 2024-07-11: qty 60, 30d supply, fill #8

## 2023-11-01 MED ORDER — BUSPIRONE HCL 7.5 MG PO TABS
7.5000 mg | ORAL_TABLET | Freq: Two times a day (BID) | ORAL | 2 refills | Status: DC
  Filled 2023-11-08: qty 60, 30d supply, fill #0
  Filled 2023-11-09: qty 180, 90d supply, fill #0
  Filled 2023-11-09: qty 60, 30d supply, fill #0
  Filled 2023-11-15 – 2023-12-07 (×3): qty 60, 30d supply, fill #1
  Filled 2023-12-27 – 2023-12-30 (×2): qty 60, 30d supply, fill #2
  Filled 2024-01-11 – 2024-02-04 (×3): qty 60, 30d supply, fill #3
  Filled 2024-02-23 – 2024-03-09 (×3): qty 60, 30d supply, fill #4
  Filled 2024-04-11: qty 60, 30d supply, fill #5
  Filled 2024-04-27 – 2024-05-04 (×3): qty 60, 30d supply, fill #6
  Filled 2024-06-09 – 2024-06-13 (×2): qty 60, 30d supply, fill #7
  Filled 2024-07-11: qty 60, 30d supply, fill #8

## 2023-11-01 MED ORDER — CYANOCOBALAMIN 1000 MCG/ML IJ SOLN
1000.0000 ug | INTRAMUSCULAR | 2 refills | Status: DC
  Filled 2023-11-08 – 2023-11-09 (×3): qty 3, 90d supply, fill #0
  Filled 2024-03-09 (×2): qty 3, 90d supply, fill #1
  Filled 2024-04-27 – 2024-05-04 (×2): qty 3, 90d supply, fill #2

## 2023-11-04 ENCOUNTER — Other Ambulatory Visit: Payer: Self-pay

## 2023-11-08 ENCOUNTER — Other Ambulatory Visit: Payer: Self-pay

## 2023-11-09 ENCOUNTER — Other Ambulatory Visit: Payer: Self-pay

## 2023-11-09 ENCOUNTER — Other Ambulatory Visit (HOSPITAL_COMMUNITY): Payer: Self-pay

## 2023-11-09 ENCOUNTER — Encounter: Payer: Self-pay | Admitting: Adult Health

## 2023-11-10 ENCOUNTER — Other Ambulatory Visit (HOSPITAL_COMMUNITY): Payer: Self-pay

## 2023-11-10 ENCOUNTER — Other Ambulatory Visit: Payer: Self-pay

## 2023-11-11 ENCOUNTER — Other Ambulatory Visit (HOSPITAL_COMMUNITY): Payer: Self-pay

## 2023-11-11 ENCOUNTER — Ambulatory Visit (INDEPENDENT_AMBULATORY_CARE_PROVIDER_SITE_OTHER): Payer: PPO

## 2023-11-11 DIAGNOSIS — R55 Syncope and collapse: Secondary | ICD-10-CM

## 2023-11-11 LAB — CUP PACEART REMOTE DEVICE CHECK
Date Time Interrogation Session: 20250219230735
Implantable Pulse Generator Implant Date: 20221006

## 2023-11-12 ENCOUNTER — Encounter: Payer: Self-pay | Admitting: Cardiology

## 2023-11-15 ENCOUNTER — Other Ambulatory Visit: Payer: Self-pay

## 2023-11-15 NOTE — Progress Notes (Signed)
 Carelink Summary Report / Loop Recorder

## 2023-11-16 DIAGNOSIS — R768 Other specified abnormal immunological findings in serum: Secondary | ICD-10-CM | POA: Diagnosis not present

## 2023-11-16 DIAGNOSIS — Z79899 Other long term (current) drug therapy: Secondary | ICD-10-CM | POA: Diagnosis not present

## 2023-11-16 DIAGNOSIS — M329 Systemic lupus erythematosus, unspecified: Secondary | ICD-10-CM | POA: Diagnosis not present

## 2023-11-16 DIAGNOSIS — G9050 Complex regional pain syndrome I, unspecified: Secondary | ICD-10-CM | POA: Diagnosis not present

## 2023-11-16 DIAGNOSIS — G8929 Other chronic pain: Secondary | ICD-10-CM | POA: Diagnosis not present

## 2023-11-16 DIAGNOSIS — R5383 Other fatigue: Secondary | ICD-10-CM | POA: Diagnosis not present

## 2023-11-16 DIAGNOSIS — M1990 Unspecified osteoarthritis, unspecified site: Secondary | ICD-10-CM | POA: Diagnosis not present

## 2023-11-16 DIAGNOSIS — K219 Gastro-esophageal reflux disease without esophagitis: Secondary | ICD-10-CM | POA: Diagnosis not present

## 2023-11-16 DIAGNOSIS — M797 Fibromyalgia: Secondary | ICD-10-CM | POA: Diagnosis not present

## 2023-11-16 DIAGNOSIS — I7300 Raynaud's syndrome without gangrene: Secondary | ICD-10-CM | POA: Diagnosis not present

## 2023-11-18 ENCOUNTER — Other Ambulatory Visit (HOSPITAL_COMMUNITY): Payer: Self-pay

## 2023-11-18 ENCOUNTER — Encounter: Payer: Self-pay | Admitting: Adult Health

## 2023-11-18 ENCOUNTER — Other Ambulatory Visit: Payer: Self-pay

## 2023-11-18 ENCOUNTER — Other Ambulatory Visit: Payer: Self-pay | Admitting: Family Medicine

## 2023-11-18 ENCOUNTER — Encounter: Payer: Self-pay | Admitting: Family Medicine

## 2023-11-19 ENCOUNTER — Other Ambulatory Visit: Payer: Self-pay

## 2023-11-19 ENCOUNTER — Encounter: Payer: Self-pay | Admitting: Adult Health

## 2023-11-19 MED ORDER — BENLYSTA 200 MG/ML ~~LOC~~ SOSY: PREFILLED_SYRINGE | SUBCUTANEOUS | 10 refills | Status: DC

## 2023-11-19 MED ORDER — BD LUER-LOK SYRINGE 23G X 1" 3 ML MISC
1.0000 | 3 refills | Status: AC
  Filled 2023-11-19: qty 3, 84d supply, fill #0
  Filled 2024-03-09: qty 3, 84d supply, fill #1
  Filled 2024-04-27 – 2024-05-04 (×2): qty 3, 84d supply, fill #2
  Filled 2024-08-09: qty 3, 84d supply, fill #3

## 2023-11-19 MED ORDER — BD DISP NEEDLE 25G X 1" MISC
3 refills | Status: AC
  Filled 2023-11-19: qty 3, 30d supply, fill #0
  Filled 2024-03-09: qty 3, 84d supply, fill #1
  Filled 2024-04-27 – 2024-05-04 (×2): qty 3, 84d supply, fill #2
  Filled 2024-08-09: qty 3, 84d supply, fill #3

## 2023-11-22 ENCOUNTER — Other Ambulatory Visit: Payer: Self-pay

## 2023-11-22 NOTE — Progress Notes (Signed)
 Pharmacy Patient Advocate Encounter  Insurance verification completed.   The patient is insured through [facility] [address] ADVANTAGE/RX ADVANCE   Ran test claim for Benlysta. Currently a quantity of 4 ml is a 28 day supply and the co-pay is $1,386.  Patient may be eligible for a Medicare prescription Payment plan. The patient will need to reach out to their insurance company to enroll in the payment plan to spread out their payments throughout the year, If available.  This test claim was processed through [facility]- copay amounts may vary at other pharmacies due to pharmacy/plan contracts, or as the patient moves through the different stages of their insurance plan.

## 2023-11-23 ENCOUNTER — Other Ambulatory Visit (HOSPITAL_COMMUNITY): Payer: Self-pay

## 2023-11-23 ENCOUNTER — Other Ambulatory Visit: Payer: Self-pay

## 2023-11-23 MED ORDER — METHADONE HCL 5 MG PO TABS
5.0000 mg | ORAL_TABLET | Freq: Four times a day (QID) | ORAL | 0 refills | Status: DC
  Filled 2023-11-23 – 2023-11-25 (×2): qty 120, 30d supply, fill #0

## 2023-11-23 MED ORDER — TOPIRAMATE 25 MG PO TABS
ORAL_TABLET | ORAL | 0 refills | Status: AC
Start: 2023-11-23 — End: 2024-09-11
  Filled 2023-11-23: qty 150, 30d supply, fill #0
  Filled 2024-07-13: qty 150, 60d supply, fill #0

## 2023-11-23 MED ORDER — TIZANIDINE HCL 4 MG PO TABS
12.0000 mg | ORAL_TABLET | Freq: Every day | ORAL | 1 refills | Status: DC
  Filled 2023-11-23 – 2023-12-07 (×3): qty 270, 90d supply, fill #0
  Filled 2024-03-09: qty 90, 30d supply, fill #0
  Filled 2024-04-11: qty 90, 30d supply, fill #1
  Filled 2024-08-08 – 2024-08-18 (×2): qty 90, 30d supply, fill #2

## 2023-11-24 ENCOUNTER — Other Ambulatory Visit (HOSPITAL_COMMUNITY): Payer: Self-pay

## 2023-11-24 ENCOUNTER — Other Ambulatory Visit: Payer: Self-pay

## 2023-11-24 NOTE — Progress Notes (Signed)
 Patient gets Benlysta through patient assistance program. Dis-enrolling.

## 2023-11-25 ENCOUNTER — Other Ambulatory Visit (HOSPITAL_COMMUNITY): Payer: Self-pay

## 2023-11-25 ENCOUNTER — Other Ambulatory Visit (HOSPITAL_BASED_OUTPATIENT_CLINIC_OR_DEPARTMENT_OTHER): Payer: Self-pay

## 2023-11-25 ENCOUNTER — Other Ambulatory Visit: Payer: Self-pay

## 2023-12-02 ENCOUNTER — Other Ambulatory Visit: Payer: Self-pay

## 2023-12-03 ENCOUNTER — Other Ambulatory Visit: Payer: Self-pay

## 2023-12-04 ENCOUNTER — Other Ambulatory Visit (HOSPITAL_COMMUNITY): Payer: Self-pay

## 2023-12-06 ENCOUNTER — Other Ambulatory Visit: Payer: Self-pay

## 2023-12-07 ENCOUNTER — Encounter: Payer: Self-pay | Admitting: Adult Health

## 2023-12-07 ENCOUNTER — Other Ambulatory Visit: Payer: Self-pay

## 2023-12-07 ENCOUNTER — Other Ambulatory Visit (HOSPITAL_COMMUNITY): Payer: Self-pay

## 2023-12-08 ENCOUNTER — Other Ambulatory Visit: Payer: Self-pay

## 2023-12-14 NOTE — Progress Notes (Signed)
 Carelink Summary Report / Loop Recorder

## 2023-12-16 ENCOUNTER — Ambulatory Visit (INDEPENDENT_AMBULATORY_CARE_PROVIDER_SITE_OTHER): Payer: PPO

## 2023-12-16 DIAGNOSIS — R55 Syncope and collapse: Secondary | ICD-10-CM

## 2023-12-16 LAB — CUP PACEART REMOTE DEVICE CHECK
Date Time Interrogation Session: 20250326230652
Implantable Pulse Generator Implant Date: 20221006

## 2023-12-18 ENCOUNTER — Encounter: Payer: Self-pay | Admitting: Cardiology

## 2023-12-21 ENCOUNTER — Other Ambulatory Visit (HOSPITAL_COMMUNITY): Payer: Self-pay

## 2023-12-21 DIAGNOSIS — M542 Cervicalgia: Secondary | ICD-10-CM | POA: Diagnosis not present

## 2023-12-21 DIAGNOSIS — G90512 Complex regional pain syndrome I of left upper limb: Secondary | ICD-10-CM | POA: Diagnosis not present

## 2023-12-21 DIAGNOSIS — G894 Chronic pain syndrome: Secondary | ICD-10-CM | POA: Diagnosis not present

## 2023-12-21 DIAGNOSIS — Z79891 Long term (current) use of opiate analgesic: Secondary | ICD-10-CM | POA: Diagnosis not present

## 2023-12-21 MED ORDER — TOPIRAMATE 25 MG PO TABS
ORAL_TABLET | ORAL | 1 refills | Status: DC
  Filled 2023-12-21 – 2023-12-30 (×4): qty 450, 90d supply, fill #0
  Filled 2024-04-11: qty 450, 90d supply, fill #1

## 2023-12-21 MED ORDER — METHADONE HCL 5 MG PO TABS
5.0000 mg | ORAL_TABLET | Freq: Four times a day (QID) | ORAL | 0 refills | Status: DC
  Filled 2023-12-23: qty 120, 30d supply, fill #0

## 2023-12-22 ENCOUNTER — Other Ambulatory Visit: Payer: Self-pay

## 2023-12-23 ENCOUNTER — Other Ambulatory Visit (HOSPITAL_COMMUNITY): Payer: Self-pay

## 2023-12-23 ENCOUNTER — Other Ambulatory Visit: Payer: Self-pay

## 2023-12-27 ENCOUNTER — Other Ambulatory Visit: Payer: Self-pay

## 2023-12-30 ENCOUNTER — Encounter: Payer: Self-pay | Admitting: Adult Health

## 2023-12-30 ENCOUNTER — Other Ambulatory Visit: Payer: Self-pay

## 2023-12-30 ENCOUNTER — Other Ambulatory Visit (HOSPITAL_COMMUNITY): Payer: Self-pay

## 2023-12-31 ENCOUNTER — Other Ambulatory Visit: Payer: Self-pay

## 2023-12-31 ENCOUNTER — Other Ambulatory Visit (HOSPITAL_COMMUNITY): Payer: Self-pay

## 2024-01-11 ENCOUNTER — Other Ambulatory Visit: Payer: Self-pay | Admitting: Family Medicine

## 2024-01-11 ENCOUNTER — Other Ambulatory Visit: Payer: Self-pay

## 2024-01-11 ENCOUNTER — Other Ambulatory Visit (HOSPITAL_COMMUNITY): Payer: Self-pay

## 2024-01-11 DIAGNOSIS — E039 Hypothyroidism, unspecified: Secondary | ICD-10-CM

## 2024-01-11 DIAGNOSIS — F341 Dysthymic disorder: Secondary | ICD-10-CM

## 2024-01-11 DIAGNOSIS — N951 Menopausal and female climacteric states: Secondary | ICD-10-CM

## 2024-01-11 MED ORDER — LANSOPRAZOLE 30 MG PO CPDR
30.0000 mg | DELAYED_RELEASE_CAPSULE | Freq: Two times a day (BID) | ORAL | 1 refills | Status: DC
  Filled 2024-02-01 – 2024-02-04 (×2): qty 60, 30d supply, fill #0
  Filled 2024-02-23 – 2024-03-09 (×3): qty 60, 30d supply, fill #1
  Filled 2024-04-11: qty 60, 30d supply, fill #2
  Filled 2024-04-27 – 2024-05-04 (×3): qty 60, 30d supply, fill #3
  Filled 2024-06-09 – 2024-06-13 (×2): qty 60, 30d supply, fill #4
  Filled 2024-07-11: qty 60, 30d supply, fill #5

## 2024-01-11 MED ORDER — LEVOTHYROXINE SODIUM 125 MCG PO TABS
125.0000 ug | ORAL_TABLET | Freq: Every morning | ORAL | 1 refills | Status: DC
  Filled 2024-02-01: qty 30, 30d supply, fill #0

## 2024-01-11 MED ORDER — FOLIC ACID 1 MG PO TABS
1.0000 mg | ORAL_TABLET | Freq: Two times a day (BID) | ORAL | 2 refills | Status: DC
  Filled 2024-02-01 – 2024-02-04 (×2): qty 60, 30d supply, fill #0
  Filled 2024-02-23 – 2024-03-09 (×3): qty 60, 30d supply, fill #1
  Filled 2024-04-11: qty 60, 30d supply, fill #2

## 2024-01-11 MED ORDER — SERTRALINE HCL 100 MG PO TABS
200.0000 mg | ORAL_TABLET | Freq: Every morning | ORAL | 1 refills | Status: DC
  Filled 2024-02-01 – 2024-02-04 (×2): qty 60, 30d supply, fill #0
  Filled 2024-02-23 – 2024-03-09 (×3): qty 60, 30d supply, fill #1
  Filled 2024-04-11: qty 60, 30d supply, fill #2
  Filled 2024-04-27 – 2024-05-04 (×3): qty 60, 30d supply, fill #3
  Filled 2024-06-09 – 2024-06-13 (×2): qty 60, 30d supply, fill #4
  Filled 2024-07-11: qty 60, 30d supply, fill #5

## 2024-01-11 MED ORDER — MEDROXYPROGESTERONE ACETATE 2.5 MG PO TABS
2.5000 mg | ORAL_TABLET | Freq: Every morning | ORAL | 1 refills | Status: DC
  Filled 2024-02-01: qty 90, 90d supply, fill #0
  Filled 2024-02-04: qty 30, 30d supply, fill #0
  Filled 2024-02-23 – 2024-03-09 (×3): qty 30, 30d supply, fill #1
  Filled 2024-04-11: qty 30, 30d supply, fill #2
  Filled 2024-05-31: qty 30, 30d supply, fill #3
  Filled 2024-07-11: qty 30, 30d supply, fill #4
  Filled 2024-08-07: qty 30, 30d supply, fill #5

## 2024-01-11 MED ORDER — ESTRADIOL 2 MG PO TABS
2.0000 mg | ORAL_TABLET | Freq: Every day | ORAL | 1 refills | Status: DC
Start: 2024-01-11 — End: 2024-08-14
  Filled 2024-02-01 – 2024-02-04 (×2): qty 30, 30d supply, fill #0
  Filled 2024-02-23 – 2024-02-28 (×3): qty 30, 30d supply, fill #1
  Filled 2024-04-11: qty 30, 30d supply, fill #2
  Filled 2024-04-27 – 2024-05-04 (×2): qty 30, 30d supply, fill #3
  Filled 2024-05-31: qty 30, 30d supply, fill #4
  Filled 2024-07-05: qty 30, 30d supply, fill #5

## 2024-01-12 ENCOUNTER — Other Ambulatory Visit (HOSPITAL_COMMUNITY): Payer: Self-pay

## 2024-01-20 LAB — CUP PACEART REMOTE DEVICE CHECK
Date Time Interrogation Session: 20250430230602
Implantable Pulse Generator Implant Date: 20221006

## 2024-01-23 ENCOUNTER — Encounter: Payer: Self-pay | Admitting: Cardiology

## 2024-01-25 ENCOUNTER — Other Ambulatory Visit: Payer: Self-pay

## 2024-01-25 ENCOUNTER — Other Ambulatory Visit (HOSPITAL_COMMUNITY): Payer: Self-pay

## 2024-01-25 DIAGNOSIS — G90512 Complex regional pain syndrome I of left upper limb: Secondary | ICD-10-CM | POA: Diagnosis not present

## 2024-01-25 DIAGNOSIS — Z79891 Long term (current) use of opiate analgesic: Secondary | ICD-10-CM | POA: Diagnosis not present

## 2024-01-25 DIAGNOSIS — M542 Cervicalgia: Secondary | ICD-10-CM | POA: Diagnosis not present

## 2024-01-25 DIAGNOSIS — G894 Chronic pain syndrome: Secondary | ICD-10-CM | POA: Diagnosis not present

## 2024-01-25 MED ORDER — METHADONE HCL 5 MG PO TABS
5.0000 mg | ORAL_TABLET | Freq: Four times a day (QID) | ORAL | 0 refills | Status: DC
  Filled 2024-01-27: qty 120, 30d supply, fill #0

## 2024-01-26 NOTE — Progress Notes (Signed)
 Carelink Summary Report / Loop Recorder

## 2024-01-27 ENCOUNTER — Other Ambulatory Visit: Payer: Self-pay

## 2024-02-01 ENCOUNTER — Other Ambulatory Visit: Payer: Self-pay

## 2024-02-01 ENCOUNTER — Encounter: Payer: Self-pay | Admitting: Adult Health

## 2024-02-02 ENCOUNTER — Other Ambulatory Visit: Payer: Self-pay | Admitting: Family Medicine

## 2024-02-02 ENCOUNTER — Other Ambulatory Visit: Payer: Self-pay

## 2024-02-02 DIAGNOSIS — E039 Hypothyroidism, unspecified: Secondary | ICD-10-CM

## 2024-02-03 ENCOUNTER — Other Ambulatory Visit: Payer: Self-pay

## 2024-02-04 ENCOUNTER — Encounter: Payer: Self-pay | Admitting: Adult Health

## 2024-02-04 ENCOUNTER — Other Ambulatory Visit: Payer: Self-pay

## 2024-02-04 ENCOUNTER — Other Ambulatory Visit: Payer: Self-pay | Admitting: Family Medicine

## 2024-02-04 DIAGNOSIS — E039 Hypothyroidism, unspecified: Secondary | ICD-10-CM

## 2024-02-04 MED ORDER — LEVOTHYROXINE SODIUM 125 MCG PO TABS
125.0000 ug | ORAL_TABLET | Freq: Every morning | ORAL | 1 refills | Status: DC
Start: 2024-02-04 — End: 2024-08-15
  Filled 2024-02-04: qty 30, 30d supply, fill #0
  Filled 2024-02-23 – 2024-03-09 (×3): qty 30, 30d supply, fill #1
  Filled 2024-04-11: qty 30, 30d supply, fill #2
  Filled 2024-04-27 – 2024-05-04 (×3): qty 30, 30d supply, fill #3
  Filled 2024-06-09 – 2024-06-13 (×2): qty 30, 30d supply, fill #4
  Filled 2024-07-11: qty 30, 30d supply, fill #5

## 2024-02-21 ENCOUNTER — Ambulatory Visit: Payer: Self-pay | Admitting: Cardiology

## 2024-02-21 LAB — CUP PACEART REMOTE DEVICE CHECK
Date Time Interrogation Session: 20250531231823
Implantable Pulse Generator Implant Date: 20221006

## 2024-02-22 ENCOUNTER — Other Ambulatory Visit (HOSPITAL_COMMUNITY): Payer: Self-pay

## 2024-02-22 DIAGNOSIS — Z79891 Long term (current) use of opiate analgesic: Secondary | ICD-10-CM | POA: Diagnosis not present

## 2024-02-22 DIAGNOSIS — M542 Cervicalgia: Secondary | ICD-10-CM | POA: Diagnosis not present

## 2024-02-22 DIAGNOSIS — G894 Chronic pain syndrome: Secondary | ICD-10-CM | POA: Diagnosis not present

## 2024-02-22 DIAGNOSIS — G90512 Complex regional pain syndrome I of left upper limb: Secondary | ICD-10-CM | POA: Diagnosis not present

## 2024-02-22 MED ORDER — METHADONE HCL 5 MG PO TABS
5.0000 mg | ORAL_TABLET | Freq: Four times a day (QID) | ORAL | 0 refills | Status: DC
  Filled 2024-02-29: qty 120, 30d supply, fill #0

## 2024-02-23 ENCOUNTER — Other Ambulatory Visit: Payer: Self-pay | Admitting: Family Medicine

## 2024-02-23 ENCOUNTER — Other Ambulatory Visit: Payer: Self-pay

## 2024-02-23 MED ORDER — LEVOCETIRIZINE DIHYDROCHLORIDE 5 MG PO TABS
5.0000 mg | ORAL_TABLET | Freq: Every evening | ORAL | 2 refills | Status: AC
  Filled 2024-03-09: qty 30, 30d supply, fill #0
  Filled 2024-03-09: qty 90, 90d supply, fill #0
  Filled 2024-04-11: qty 30, 30d supply, fill #1
  Filled 2024-04-27 – 2024-05-04 (×3): qty 30, 30d supply, fill #2
  Filled 2024-06-09 – 2024-06-13 (×2): qty 30, 30d supply, fill #3
  Filled 2024-07-11: qty 30, 30d supply, fill #4
  Filled 2024-08-08 – 2024-08-18 (×2): qty 30, 30d supply, fill #5
  Filled 2024-09-11: qty 30, 30d supply, fill #6
  Filled 2024-10-08: qty 30, 30d supply, fill #7

## 2024-02-24 ENCOUNTER — Ambulatory Visit (INDEPENDENT_AMBULATORY_CARE_PROVIDER_SITE_OTHER): Payer: PPO

## 2024-02-24 DIAGNOSIS — R55 Syncope and collapse: Secondary | ICD-10-CM | POA: Diagnosis not present

## 2024-02-29 ENCOUNTER — Other Ambulatory Visit (HOSPITAL_COMMUNITY): Payer: Self-pay

## 2024-02-29 ENCOUNTER — Other Ambulatory Visit: Payer: Self-pay

## 2024-03-02 ENCOUNTER — Other Ambulatory Visit (HOSPITAL_COMMUNITY): Payer: Self-pay

## 2024-03-02 ENCOUNTER — Other Ambulatory Visit: Payer: Self-pay

## 2024-03-02 ENCOUNTER — Encounter: Payer: Self-pay | Admitting: Adult Health

## 2024-03-02 MED ORDER — CLINPRO 5000 1.1 % DT PSTE
PASTE | DENTAL | 3 refills | Status: AC
  Filled 2024-03-02 (×2): qty 100, 30d supply, fill #0

## 2024-03-03 ENCOUNTER — Other Ambulatory Visit (HOSPITAL_COMMUNITY): Payer: Self-pay

## 2024-03-06 ENCOUNTER — Other Ambulatory Visit (HOSPITAL_COMMUNITY): Payer: Self-pay

## 2024-03-06 ENCOUNTER — Other Ambulatory Visit: Payer: Self-pay

## 2024-03-09 ENCOUNTER — Other Ambulatory Visit: Payer: Self-pay

## 2024-03-09 ENCOUNTER — Other Ambulatory Visit (HOSPITAL_COMMUNITY): Payer: Self-pay

## 2024-03-09 ENCOUNTER — Encounter: Payer: Self-pay | Admitting: Adult Health

## 2024-03-10 ENCOUNTER — Other Ambulatory Visit: Payer: Self-pay

## 2024-03-13 ENCOUNTER — Other Ambulatory Visit: Payer: Self-pay

## 2024-03-14 ENCOUNTER — Other Ambulatory Visit: Payer: Self-pay

## 2024-03-15 DIAGNOSIS — M329 Systemic lupus erythematosus, unspecified: Secondary | ICD-10-CM | POA: Diagnosis not present

## 2024-03-15 DIAGNOSIS — R768 Other specified abnormal immunological findings in serum: Secondary | ICD-10-CM | POA: Diagnosis not present

## 2024-03-15 DIAGNOSIS — G9050 Complex regional pain syndrome I, unspecified: Secondary | ICD-10-CM | POA: Diagnosis not present

## 2024-03-15 DIAGNOSIS — Z79899 Other long term (current) drug therapy: Secondary | ICD-10-CM | POA: Diagnosis not present

## 2024-03-15 DIAGNOSIS — I7300 Raynaud's syndrome without gangrene: Secondary | ICD-10-CM | POA: Diagnosis not present

## 2024-03-15 DIAGNOSIS — K219 Gastro-esophageal reflux disease without esophagitis: Secondary | ICD-10-CM | POA: Diagnosis not present

## 2024-03-15 DIAGNOSIS — M797 Fibromyalgia: Secondary | ICD-10-CM | POA: Diagnosis not present

## 2024-03-15 DIAGNOSIS — R5383 Other fatigue: Secondary | ICD-10-CM | POA: Diagnosis not present

## 2024-03-15 DIAGNOSIS — G8929 Other chronic pain: Secondary | ICD-10-CM | POA: Diagnosis not present

## 2024-03-15 DIAGNOSIS — M1990 Unspecified osteoarthritis, unspecified site: Secondary | ICD-10-CM | POA: Diagnosis not present

## 2024-03-21 ENCOUNTER — Other Ambulatory Visit (HOSPITAL_COMMUNITY): Payer: Self-pay

## 2024-03-21 DIAGNOSIS — Z79891 Long term (current) use of opiate analgesic: Secondary | ICD-10-CM | POA: Diagnosis not present

## 2024-03-21 DIAGNOSIS — G894 Chronic pain syndrome: Secondary | ICD-10-CM | POA: Diagnosis not present

## 2024-03-21 DIAGNOSIS — G90512 Complex regional pain syndrome I of left upper limb: Secondary | ICD-10-CM | POA: Diagnosis not present

## 2024-03-21 DIAGNOSIS — M542 Cervicalgia: Secondary | ICD-10-CM | POA: Diagnosis not present

## 2024-03-21 MED ORDER — TIZANIDINE HCL 4 MG PO TABS
12.0000 mg | ORAL_TABLET | Freq: Every day | ORAL | 1 refills | Status: DC
  Filled 2024-04-27 (×2): qty 270, 90d supply, fill #0
  Filled 2024-05-04: qty 90, 30d supply, fill #0
  Filled 2024-06-09 – 2024-06-13 (×2): qty 90, 30d supply, fill #1

## 2024-03-21 MED ORDER — METHADONE HCL 5 MG PO TABS
5.0000 mg | ORAL_TABLET | Freq: Four times a day (QID) | ORAL | 0 refills | Status: DC
  Filled 2024-03-29: qty 120, 30d supply, fill #0

## 2024-03-21 MED ORDER — TOPIRAMATE 25 MG PO TABS: ORAL_TABLET | ORAL | 0 refills | Status: AC

## 2024-03-27 ENCOUNTER — Ambulatory Visit

## 2024-03-27 ENCOUNTER — Other Ambulatory Visit (HOSPITAL_COMMUNITY): Payer: Self-pay

## 2024-03-27 DIAGNOSIS — R55 Syncope and collapse: Secondary | ICD-10-CM

## 2024-03-27 LAB — CUP PACEART REMOTE DEVICE CHECK
Date Time Interrogation Session: 20250705231039
Implantable Pulse Generator Implant Date: 20221006

## 2024-03-29 ENCOUNTER — Other Ambulatory Visit: Payer: Self-pay

## 2024-03-29 ENCOUNTER — Other Ambulatory Visit (HOSPITAL_COMMUNITY): Payer: Self-pay

## 2024-04-01 ENCOUNTER — Ambulatory Visit: Payer: Self-pay | Admitting: Cardiology

## 2024-04-03 ENCOUNTER — Other Ambulatory Visit: Payer: Self-pay

## 2024-04-04 ENCOUNTER — Other Ambulatory Visit: Payer: Self-pay

## 2024-04-07 ENCOUNTER — Other Ambulatory Visit (HOSPITAL_COMMUNITY): Payer: Self-pay

## 2024-04-11 ENCOUNTER — Encounter: Payer: Self-pay | Admitting: Adult Health

## 2024-04-11 ENCOUNTER — Other Ambulatory Visit: Payer: Self-pay

## 2024-04-11 NOTE — Progress Notes (Signed)
 Carelink Summary Report / Loop Recorder

## 2024-04-12 ENCOUNTER — Other Ambulatory Visit (HOSPITAL_COMMUNITY): Payer: Self-pay

## 2024-04-12 ENCOUNTER — Other Ambulatory Visit: Payer: Self-pay

## 2024-04-13 ENCOUNTER — Other Ambulatory Visit: Payer: Self-pay

## 2024-04-14 ENCOUNTER — Other Ambulatory Visit: Payer: Self-pay

## 2024-04-19 ENCOUNTER — Other Ambulatory Visit: Payer: Self-pay

## 2024-04-21 DIAGNOSIS — G9050 Complex regional pain syndrome I, unspecified: Secondary | ICD-10-CM | POA: Insufficient documentation

## 2024-04-21 DIAGNOSIS — R768 Other specified abnormal immunological findings in serum: Secondary | ICD-10-CM | POA: Insufficient documentation

## 2024-04-25 ENCOUNTER — Other Ambulatory Visit (HOSPITAL_COMMUNITY): Payer: Self-pay

## 2024-04-25 MED ORDER — METHADONE HCL 5 MG PO TABS
5.0000 mg | ORAL_TABLET | Freq: Four times a day (QID) | ORAL | 0 refills | Status: DC
  Filled 2024-04-27: qty 120, 30d supply, fill #0

## 2024-04-27 ENCOUNTER — Ambulatory Visit

## 2024-04-27 ENCOUNTER — Other Ambulatory Visit: Payer: Self-pay

## 2024-04-27 ENCOUNTER — Other Ambulatory Visit (HOSPITAL_COMMUNITY): Payer: Self-pay

## 2024-04-27 DIAGNOSIS — R55 Syncope and collapse: Secondary | ICD-10-CM | POA: Diagnosis not present

## 2024-04-27 LAB — CUP PACEART REMOTE DEVICE CHECK
Date Time Interrogation Session: 20250805230952
Implantable Pulse Generator Implant Date: 20221006

## 2024-04-27 MED ORDER — FOLIC ACID 1 MG PO TABS
1.0000 mg | ORAL_TABLET | Freq: Two times a day (BID) | ORAL | 2 refills | Status: DC
  Filled 2024-04-27 – 2024-05-04 (×2): qty 60, 30d supply, fill #0
  Filled 2024-06-09 – 2024-06-13 (×2): qty 60, 30d supply, fill #1
  Filled 2024-07-11: qty 60, 30d supply, fill #2

## 2024-04-28 ENCOUNTER — Ambulatory Visit: Payer: Self-pay | Admitting: Cardiology

## 2024-05-04 ENCOUNTER — Other Ambulatory Visit: Payer: Self-pay

## 2024-05-04 ENCOUNTER — Encounter: Payer: Self-pay | Admitting: Adult Health

## 2024-05-05 ENCOUNTER — Other Ambulatory Visit: Payer: Self-pay

## 2024-05-08 ENCOUNTER — Other Ambulatory Visit: Payer: Self-pay

## 2024-05-09 ENCOUNTER — Other Ambulatory Visit: Payer: Self-pay

## 2024-05-10 ENCOUNTER — Other Ambulatory Visit: Payer: Self-pay

## 2024-05-25 ENCOUNTER — Ambulatory Visit: Admitting: Orthopedic Surgery

## 2024-05-25 DIAGNOSIS — S93492A Sprain of other ligament of left ankle, initial encounter: Secondary | ICD-10-CM

## 2024-05-26 ENCOUNTER — Encounter: Payer: Self-pay | Admitting: Orthopedic Surgery

## 2024-05-26 NOTE — Progress Notes (Signed)
 Office Visit Note   Patient: Jo [name]           Date of Birth: 03-24-1961           MRN: [id number] Visit Date: 05/25/2024              Requested by: [place], [name], MD [address] [place],  [place] [address] PCP: [place], [name], MD  Chief Complaint  Patient presents with   Left Leg - Pain      HPI: Discussed the use of AI scribe software for clinical note transcription with the patient, who gave verbal consent to proceed.  History of Present Illness Jo [name] is a 63 year old female with lupus and rheumatoid arthritis who presents with foot and leg pain after a fall.  She experiences severe pain primarily on the top of her left foot, extending up her leg to her hip, which began after she fell and twisted her leg into a split. The pain is exacerbated by bending her foot.  She has a history of not experiencing significant swelling despite injuries, which she attributes to her use of Benlysta  for lupus and rheumatoid arthritis. She recalls a previous incident where she tore her calf muscle and had plantar fasciitis without noticeable swelling, diagnosed after an MRI over a year later.  She reports tenderness in her hamstrings and adductors, likely from the fall. Additionally, she experiences bursitis and sciatica, which have been exacerbated by the recent injury. She describes a 'numby sensitive feeling' in her thigh and hip area, which she attributes to nerve irritation, possibly neuralgia paresthetica, as mentioned by her pain doctor.  Her current medications include Benlysta  for lupus and rheumatoid arthritis.     Assessment & Plan: Visit Diagnoses:  1. Sprain of anterior talofibular ligament of left ankle, initial encounter     Plan: Assessment and Plan Assessment & Plan Left ankle ligament sprain and midfoot pain Acute sprain of the anterior talofibular ligament and midfoot pain due to twisting injury. - Place in a short leg fracture boot to alleviate  pressure. - Instruct to wear the boot while walking, not while sitting or sleeping. - Schedule follow-up in four weeks to assess healing and consider an x-ray.  Left hamstring and adductor muscle strain Acute muscle strain in left hamstrings and adductors due to fall. Possible neuralgia paresthetica due to nerve impingement.      Follow-Up Instructions: Return if symptoms worsen or fail to improve.   Ortho Exam  Patient is alert, oriented, no adenopathy, well-dressed, normal affect, normal respiratory effort. Physical Exam EXTREMITIES: Good dorsalis pedis pulse, no swelling in left foot. Tenderness to palpation of anterior talofibular ligament in left foot. Lisfranc complex and metatarsals not tender to palpation. Medial and lateral malleolus not tender to palpation. MUSCULOSKELETAL: No effusion or pain with passive range of motion in left knee. Good internal and external rotation of left hip without pain. Tenderness in proximal hamstrings and adductors.      Imaging: No results found. No images are attached to the encounter.  Labs: Lab Results  Component Value Date   HGBA1C 5.6 11/15/2019   ESRSEDRATE 26 02/04/2022   ESRSEDRATE 6 07/30/2020   ESRSEDRATE 16 11/29/2019   CRP 0.9 07/30/2020   CRP <1.0 11/29/2019   LABURIC 4.7 02/04/2022     Lab Results  Component Value Date   ALBUMIN 4.0 12/02/2021   ALBUMIN 4.4 10/23/2021   ALBUMIN 4.3 08/28/2021    No results found  for: MG Lab Results  Component Value Date   VD25OH 99.35 10/23/2021   VD25OH 83.61 04/27/2018    No results found for: PREALBUMIN    Latest Ref Rng & Units 11/11/2022    1:40 PM 02/04/2022   12:13 PM 12/02/2021   12:00 AM  CBC EXTENDED  WBC 4.0 - 10.5 K/uL 6.3  5.5  5.5      RBC 3.87 - 5.11 Mil/uL 4.57  4.43  4.44      Hemoglobin 12.0 - 15.0 g/dL 86.3  86.6  86.8      HCT 36.0 - 46.0 % 41.5  39.9  40      Platelets 150.0 - 400.0 K/uL 299.0  274.0  241      NEUT# 1.4 - 7.7 K/uL  3.7     Lymph# 0.7 - 4.0 K/uL  1.3       This result is from an external source.     There is no height or weight on file to calculate BMI.  Orders:  No orders of the defined types were placed in this encounter.  No orders of the defined types were placed in this encounter.    Procedures: No procedures performed  Clinical Data: No additional findings.  ROS:  All other systems negative, except as noted in the HPI. Review of Systems  Objective: Vital Signs: There were no vitals taken for this visit.  Specialty Comments:  No specialty comments available.  PMFS History: Patient Active Problem List   Diagnosis Date Noted   Positive ANA (antinuclear antibody) 04/21/2024   Complex regional pain syndrome I 04/21/2024   Rash under right breast 09/27/2023   Asthma with acute exacerbation 09/27/2023   Productive cough 09/21/2023   Nausea without vomiting 12/14/2022   Chronic cough 11/11/2022   SVT (supraventricular tachycardia) (HCC) 05/12/2022   Seasonal allergies 05/12/2022   Menopausal vasomotor syndrome 05/12/2022   Endometrial polyp    Postmenopausal bleeding 11/08/2021   Raynaud phenomenon 10/23/2021   Anemia 10/23/2021   Palpitations 06/18/2021   Depression, recurrent (HCC) 07/22/2020   Systemic lupus erythematosus (HCC) 04/29/2020   Rheumatoid arthritis (HCC) 12/21/2019   Fibromyalgia 12/21/2019   Iron  deficiency 12/13/2019   Reflex sympathetic dystrophy of lower extremity 06/12/2019   Falls 09/25/2015   Chronic headaches 09/25/2015   Memory changes 09/25/2015   Polycystic ovaries 07/11/2013   Hypothyroidism 05/04/2013   Complex regional pain syndrome I, unspecified 03/06/2013   History of colonic polyps 02/25/2009   Anxiety disorder 01/14/2008   Cough variant asthma 01/14/2008   Gastro-esophageal reflux disease without esophagitis 01/14/2008   Irritable colon 01/14/2008   Past Medical History:  Diagnosis Date   Allergy    Anemia    Anxiety    Anxiety  and depression    Arrhythmia 2021   Arthritis    Asthma    Cervical cancer (HCC)    age 34    Chronic headaches    Colon polyp    COPD (chronic obstructive pulmonary disease) (HCC)    no o2   Depression    Dysrhythmia    Endometriosis    Fibromyalgia    Fundic gland polyps of stomach, benign    Gastritis    GERD (gastroesophageal reflux disease)    H. pylori infection 2013   H/O gastric ulcer 1985   History of anorexia nervosa    History of bulimia    History of pneumonia    Hypothyroidism    Irritable bowel syndrome (IBS)  Obesity    Personal history of other mental and behavioral disorders 01/14/2008   Qualifier: Diagnosis of  By: Avram MD, NOLIA Lupita BRAVO   Overview:  Overview:  Annotation: BULIMIA Qualifier: Diagnosis of  By: Josphine Penny Alexandro Olivia    PONV (postoperative nausea and vomiting)    iv phenergan  helps   REFLEX SYMPATHETIC DYSTROPHY 02/26/2009   Qualifier: Diagnosis of  By: Avram MD, NOLIA Lupita BRAVO    Rheumatoid arteritis (HCC)    RSD (reflex sympathetic dystrophy)    Sleep apnea    Systemic lupus (HCC) 2021   Ulcer    gastric, duodenal ulcer    Family History  Problem Relation Age of Onset   Colon polyps Mother        part of intestines removed   Raynaud syndrome Mother    Irritable bowel syndrome Mother    Other Mother        cold agluten   Stroke Mother    Colon polyps Sister    Breast cancer Maternal Aunt        first time 44s second time 73s   Lung cancer Maternal Grandfather    Colon polyps Brother    Kidney Stones Brother    Colon cancer Neg Hx    Esophageal cancer Neg Hx    Rectal cancer Neg Hx    Stomach cancer Neg Hx    Migraines Neg Hx    Seizures Neg Hx     Past Surgical History:  Procedure Laterality Date   CERVICAL SPINE SURGERY  1990   CERVIX SURGERY     CHOLECYSTECTOMY     COLONOSCOPY     HYSTEROSCOPY WITH D & C N/A 11/11/2021   Procedure: DILATATION AND CURETTAGE /HYSTEROSCOPY;  Surgeon: Cleatus Moccasin, MD;  Location:  [facility];  Service: Gynecology;  Laterality: N/A;   KNEE ARTHROSCOPY  04/21/2012   right   LAPAROSCOPIC NISSEN FUNDOPLICATION  09/05/2007   Dr. Donnice Lunger   laparoscopy     for endometriosis   LOOP RECORDER INSERTION Left 06/2021   lumbar spondylosis injected  2013   Dr. Cesario   NASAL SINUS SURGERY     THORACOSCOPY W/ THORACIC SYMPATHECTOMY     for RSD   TONSILLECTOMY     UPPER GASTROINTESTINAL ENDOSCOPY     Social History   Occupational History   Occupation: flight attendant-disabled    Employer: UNEMPLOYED  Tobacco Use   Smoking status: Never   Smokeless tobacco: Never  Vaping Use   Vaping status: Never Used  Substance and Sexual Activity   Alcohol use: No   Drug use: No   Sexual activity: Not Currently    Birth control/protection: None

## 2024-05-29 ENCOUNTER — Ambulatory Visit

## 2024-05-29 ENCOUNTER — Encounter

## 2024-05-29 ENCOUNTER — Telehealth: Payer: Self-pay | Admitting: Physician Assistant

## 2024-05-29 ENCOUNTER — Telehealth: Payer: Self-pay | Admitting: Orthopedic Surgery

## 2024-05-29 DIAGNOSIS — R55 Syncope and collapse: Secondary | ICD-10-CM | POA: Diagnosis not present

## 2024-05-29 LAB — CUP PACEART REMOTE DEVICE CHECK
Date Time Interrogation Session: 20250907232426
Implantable Pulse Generator Implant Date: 20221006

## 2024-05-29 NOTE — Telephone Encounter (Signed)
 Patient called and said that the boot that you gave her last week seems to be flat and  not helpful. CB#[id number]

## 2024-05-29 NOTE — Telephone Encounter (Signed)
 This pt was seen in office and has an ATFL sprain. She was placed in a short fx boot. I am not sure what she means by flat but states that this is not helpful. Any other suggestions?

## 2024-05-29 NOTE — Telephone Encounter (Signed)
 Please see below and advise.

## 2024-05-29 NOTE — Telephone Encounter (Signed)
 Pt called states that her boot is putting more presser on her fracture and causing more pains. Pt states she thought the boot would rock her back on her heel. Please call pt about this matter at [phone].

## 2024-05-31 ENCOUNTER — Other Ambulatory Visit: Payer: Self-pay

## 2024-05-31 ENCOUNTER — Other Ambulatory Visit (HOSPITAL_COMMUNITY): Payer: Self-pay

## 2024-05-31 MED ORDER — TIZANIDINE HCL 4 MG PO TABS
12.0000 mg | ORAL_TABLET | Freq: Every day | ORAL | 1 refills | Status: DC
  Filled 2024-05-31 – 2024-06-02 (×2): qty 90, 30d supply, fill #0
  Filled 2024-06-07 – 2024-07-11 (×3): qty 90, 30d supply, fill #1
  Filled 2024-08-09 – 2024-09-11 (×2): qty 90, 30d supply, fill #2

## 2024-05-31 MED ORDER — METHADONE HCL 5 MG PO TABS
5.0000 mg | ORAL_TABLET | Freq: Four times a day (QID) | ORAL | 0 refills | Status: DC
  Filled 2024-05-31 – 2024-06-02 (×2): qty 120, 30d supply, fill #0

## 2024-05-31 NOTE — Telephone Encounter (Signed)
 I called pt and advised that this will help to keep the foot immobile and that she should wear during day time hours and can remove when more sedentary. Should wear until her f/u visit on 06/22/2024 can eval at that time. The boot has a rocker bottom to help with more natural mobility without moving the ankle. Pt voiced understanding and will call with any other questions.

## 2024-06-02 ENCOUNTER — Other Ambulatory Visit: Payer: Self-pay

## 2024-06-02 ENCOUNTER — Other Ambulatory Visit (HOSPITAL_COMMUNITY): Payer: Self-pay

## 2024-06-02 ENCOUNTER — Other Ambulatory Visit (HOSPITAL_BASED_OUTPATIENT_CLINIC_OR_DEPARTMENT_OTHER): Payer: Self-pay

## 2024-06-04 ENCOUNTER — Ambulatory Visit: Payer: Self-pay | Admitting: Cardiology

## 2024-06-05 ENCOUNTER — Other Ambulatory Visit: Payer: Self-pay

## 2024-06-05 ENCOUNTER — Other Ambulatory Visit (HOSPITAL_COMMUNITY): Payer: Self-pay

## 2024-06-05 ENCOUNTER — Encounter: Payer: Self-pay | Admitting: Adult Health

## 2024-06-07 ENCOUNTER — Other Ambulatory Visit (HOSPITAL_COMMUNITY): Payer: Self-pay

## 2024-06-08 NOTE — Progress Notes (Signed)
 Remote Loop Recorder Transmission

## 2024-06-09 ENCOUNTER — Other Ambulatory Visit: Payer: Self-pay

## 2024-06-09 ENCOUNTER — Encounter: Payer: Self-pay | Admitting: Adult Health

## 2024-06-12 ENCOUNTER — Other Ambulatory Visit: Payer: Self-pay

## 2024-06-13 ENCOUNTER — Encounter: Payer: Self-pay | Admitting: Adult Health

## 2024-06-13 ENCOUNTER — Other Ambulatory Visit: Payer: Self-pay

## 2024-06-16 NOTE — Progress Notes (Signed)
 Remote Loop Recorder Transmission

## 2024-06-22 ENCOUNTER — Ambulatory Visit: Admitting: Orthopedic Surgery

## 2024-06-26 ENCOUNTER — Other Ambulatory Visit (INDEPENDENT_AMBULATORY_CARE_PROVIDER_SITE_OTHER): Payer: Self-pay

## 2024-06-26 ENCOUNTER — Encounter: Payer: Self-pay | Admitting: Adult Health

## 2024-06-26 ENCOUNTER — Ambulatory Visit: Admitting: Orthopedic Surgery

## 2024-06-26 DIAGNOSIS — M25552 Pain in left hip: Secondary | ICD-10-CM | POA: Diagnosis not present

## 2024-06-26 DIAGNOSIS — M545 Low back pain, unspecified: Secondary | ICD-10-CM

## 2024-06-26 DIAGNOSIS — M79605 Pain in left leg: Secondary | ICD-10-CM

## 2024-06-26 DIAGNOSIS — S93492A Sprain of other ligament of left ankle, initial encounter: Secondary | ICD-10-CM

## 2024-06-27 ENCOUNTER — Encounter: Payer: Self-pay | Admitting: Orthopedic Surgery

## 2024-06-27 ENCOUNTER — Other Ambulatory Visit: Payer: Self-pay

## 2024-06-27 ENCOUNTER — Other Ambulatory Visit (HOSPITAL_COMMUNITY): Payer: Self-pay

## 2024-06-27 DIAGNOSIS — G894 Chronic pain syndrome: Secondary | ICD-10-CM | POA: Diagnosis not present

## 2024-06-27 DIAGNOSIS — M542 Cervicalgia: Secondary | ICD-10-CM | POA: Diagnosis not present

## 2024-06-27 DIAGNOSIS — Z79891 Long term (current) use of opiate analgesic: Secondary | ICD-10-CM | POA: Diagnosis not present

## 2024-06-27 DIAGNOSIS — G90512 Complex regional pain syndrome I of left upper limb: Secondary | ICD-10-CM | POA: Diagnosis not present

## 2024-06-27 MED ORDER — METHADONE HCL 5 MG PO TABS
5.0000 mg | ORAL_TABLET | Freq: Four times a day (QID) | ORAL | 0 refills | Status: DC
  Filled 2024-06-30: qty 120, 30d supply, fill #0

## 2024-06-27 MED ORDER — METHADONE HCL 5 MG PO TABS
5.0000 mg | ORAL_TABLET | Freq: Four times a day (QID) | ORAL | 0 refills | Status: AC
  Filled 2024-08-09 – 2024-08-29 (×2): qty 120, 30d supply, fill #0

## 2024-06-27 MED ORDER — ONDANSETRON HCL 4 MG PO TABS
4.0000 mg | ORAL_TABLET | Freq: Three times a day (TID) | ORAL | 1 refills | Status: DC | PRN
  Filled 2024-06-27: qty 45, 15d supply, fill #0
  Filled 2024-07-13: qty 45, 15d supply, fill #1
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
## 2024-06-28 ENCOUNTER — Ambulatory Visit

## 2024-06-28 DIAGNOSIS — R55 Syncope and collapse: Secondary | ICD-10-CM | POA: Diagnosis not present

## 2024-06-29 ENCOUNTER — Encounter

## 2024-06-29 LAB — CUP PACEART REMOTE DEVICE CHECK
Date Time Interrogation Session: 20251007230726
Implantable Pulse Generator Implant Date: 20221006

## 2024-06-30 ENCOUNTER — Other Ambulatory Visit: Payer: Self-pay

## 2024-06-30 NOTE — Progress Notes (Signed)
 Remote Loop Recorder Transmission

## 2024-07-03 ENCOUNTER — Ambulatory Visit: Payer: Self-pay | Admitting: Cardiology

## 2024-07-03 NOTE — Progress Notes (Signed)
 Remote Loop Recorder Transmission

## 2024-07-05 ENCOUNTER — Other Ambulatory Visit: Payer: Self-pay

## 2024-07-11 ENCOUNTER — Encounter: Payer: Self-pay | Admitting: Adult Health

## 2024-07-11 ENCOUNTER — Other Ambulatory Visit (HOSPITAL_COMMUNITY): Payer: Self-pay

## 2024-07-11 ENCOUNTER — Other Ambulatory Visit: Payer: Self-pay

## 2024-07-12 ENCOUNTER — Other Ambulatory Visit: Payer: Self-pay

## 2024-07-13 ENCOUNTER — Other Ambulatory Visit: Payer: Self-pay

## 2024-07-18 DIAGNOSIS — M1990 Unspecified osteoarthritis, unspecified site: Secondary | ICD-10-CM | POA: Diagnosis not present

## 2024-07-18 DIAGNOSIS — M797 Fibromyalgia: Secondary | ICD-10-CM | POA: Diagnosis not present

## 2024-07-18 DIAGNOSIS — I7300 Raynaud's syndrome without gangrene: Secondary | ICD-10-CM | POA: Diagnosis not present

## 2024-07-18 DIAGNOSIS — G8929 Other chronic pain: Secondary | ICD-10-CM | POA: Diagnosis not present

## 2024-07-18 DIAGNOSIS — Z79899 Other long term (current) drug therapy: Secondary | ICD-10-CM | POA: Diagnosis not present

## 2024-07-18 DIAGNOSIS — M329 Systemic lupus erythematosus, unspecified: Secondary | ICD-10-CM | POA: Diagnosis not present

## 2024-07-18 DIAGNOSIS — G9050 Complex regional pain syndrome I, unspecified: Secondary | ICD-10-CM | POA: Diagnosis not present

## 2024-07-18 DIAGNOSIS — Z13820 Encounter for screening for osteoporosis: Secondary | ICD-10-CM | POA: Diagnosis not present

## 2024-07-18 DIAGNOSIS — R5383 Other fatigue: Secondary | ICD-10-CM | POA: Diagnosis not present

## 2024-07-18 DIAGNOSIS — K219 Gastro-esophageal reflux disease without esophagitis: Secondary | ICD-10-CM | POA: Diagnosis not present

## 2024-07-24 ENCOUNTER — Ambulatory Visit: Admitting: Orthopedic Surgery

## 2024-07-24 ENCOUNTER — Encounter: Payer: Self-pay | Admitting: Radiology

## 2024-07-24 DIAGNOSIS — M545 Low back pain, unspecified: Secondary | ICD-10-CM | POA: Diagnosis not present

## 2024-07-24 DIAGNOSIS — M25552 Pain in left hip: Secondary | ICD-10-CM

## 2024-07-24 DIAGNOSIS — S93492A Sprain of other ligament of left ankle, initial encounter: Secondary | ICD-10-CM

## 2024-07-24 DIAGNOSIS — M79605 Pain in left leg: Secondary | ICD-10-CM

## 2024-07-25 DIAGNOSIS — M542 Cervicalgia: Secondary | ICD-10-CM | POA: Diagnosis not present

## 2024-07-25 DIAGNOSIS — Z79891 Long term (current) use of opiate analgesic: Secondary | ICD-10-CM | POA: Diagnosis not present

## 2024-07-25 DIAGNOSIS — G894 Chronic pain syndrome: Secondary | ICD-10-CM | POA: Diagnosis not present

## 2024-07-25 DIAGNOSIS — G90512 Complex regional pain syndrome I of left upper limb: Secondary | ICD-10-CM | POA: Diagnosis not present

## 2024-07-26 ENCOUNTER — Other Ambulatory Visit (HOSPITAL_COMMUNITY): Payer: Self-pay

## 2024-07-26 ENCOUNTER — Other Ambulatory Visit: Payer: Self-pay

## 2024-07-26 ENCOUNTER — Encounter: Payer: Self-pay | Admitting: Orthopedic Surgery

## 2024-07-26 MED ORDER — TOPIRAMATE 25 MG PO TABS
ORAL_TABLET | ORAL | 1 refills | Status: DC
  Filled 2024-07-26 – 2024-08-09 (×2): qty 450, 90d supply, fill #0

## 2024-07-26 MED ORDER — TIZANIDINE HCL 4 MG PO TABS
12.0000 mg | ORAL_TABLET | Freq: Every day | ORAL | 1 refills | Status: DC
  Filled 2024-07-26 – 2024-08-09 (×2): qty 270, 90d supply, fill #0

## 2024-07-26 MED ORDER — METHADONE HCL 5 MG PO TABS
5.0000 mg | ORAL_TABLET | Freq: Four times a day (QID) | ORAL | 0 refills | Status: DC
  Filled 2024-07-26 – 2024-07-31 (×2): qty 120, 30d supply, fill #0

## 2024-07-26 MED ORDER — HYDROMORPHONE HCL 4 MG PO TABS
4.0000 mg | ORAL_TABLET | Freq: Three times a day (TID) | ORAL | 0 refills | Status: DC | PRN
  Filled 2024-07-26: qty 60, 20d supply, fill #0
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
## 2024-07-29 ENCOUNTER — Encounter

## 2024-07-31 ENCOUNTER — Encounter

## 2024-07-31 ENCOUNTER — Other Ambulatory Visit (HOSPITAL_COMMUNITY): Payer: Self-pay

## 2024-07-31 ENCOUNTER — Other Ambulatory Visit: Payer: Self-pay

## 2024-08-01 ENCOUNTER — Ambulatory Visit

## 2024-08-01 ENCOUNTER — Telehealth: Payer: Self-pay | Admitting: Family Medicine

## 2024-08-01 DIAGNOSIS — R55 Syncope and collapse: Secondary | ICD-10-CM

## 2024-08-01 LAB — CUP PACEART REMOTE DEVICE CHECK
Date Time Interrogation Session: 20251110230751
Implantable Pulse Generator Implant Date: 20221006

## 2024-08-01 NOTE — Telephone Encounter (Signed)
 Patient has been bleeding for a month, at first its it was just spotting but now it has got to the point to where she is changing a pad every 3-4hrs. Patient explained she is cramping and hurting like having a period cycle also pain in her back. Patient also explained she was told she has a growth in fallopian tube last time she was seen by Dr. Cleatus as well as she had to have 2 growths removed from uterus. She would like a nurse to call back on how to go about everything.

## 2024-08-02 ENCOUNTER — Ambulatory Visit: Payer: Self-pay | Admitting: Cardiology

## 2024-08-03 NOTE — Telephone Encounter (Signed)
 Called pt and reviewed her sx's and I advised pt to get an appt for postmenopausal bleeding.  Pt agreed to appt with Dr. Eldonna on 08/16/24 at 0935.    Danne Vasek,RN  08/03/24

## 2024-08-04 NOTE — Progress Notes (Signed)
 Remote Loop Recorder Transmission

## 2024-08-07 ENCOUNTER — Other Ambulatory Visit: Payer: Self-pay

## 2024-08-07 ENCOUNTER — Encounter: Payer: Self-pay | Admitting: Adult Health

## 2024-08-07 ENCOUNTER — Other Ambulatory Visit: Payer: Self-pay | Admitting: Family Medicine

## 2024-08-07 ENCOUNTER — Other Ambulatory Visit (HOSPITAL_COMMUNITY): Payer: Self-pay

## 2024-08-07 DIAGNOSIS — F341 Dysthymic disorder: Secondary | ICD-10-CM

## 2024-08-07 DIAGNOSIS — N951 Menopausal and female climacteric states: Secondary | ICD-10-CM

## 2024-08-07 DIAGNOSIS — E039 Hypothyroidism, unspecified: Secondary | ICD-10-CM

## 2024-08-07 MED ORDER — FOLIC ACID 1 MG PO TABS
1.0000 mg | ORAL_TABLET | Freq: Two times a day (BID) | ORAL | 2 refills | Status: AC
  Filled 2024-08-07 – 2024-08-18 (×2): qty 60, 30d supply, fill #0
  Filled 2024-09-11: qty 60, 30d supply, fill #1
  Filled 2024-10-08: qty 60, 30d supply, fill #2

## 2024-08-08 ENCOUNTER — Other Ambulatory Visit: Payer: Self-pay

## 2024-08-08 NOTE — Telephone Encounter (Signed)
 Allergy contradiction on every medication that patient is asking for. Please advise.

## 2024-08-09 ENCOUNTER — Other Ambulatory Visit (HOSPITAL_COMMUNITY): Payer: Self-pay

## 2024-08-09 ENCOUNTER — Other Ambulatory Visit: Payer: Self-pay | Admitting: Family Medicine

## 2024-08-09 ENCOUNTER — Other Ambulatory Visit: Payer: Self-pay

## 2024-08-09 DIAGNOSIS — N951 Menopausal and female climacteric states: Secondary | ICD-10-CM

## 2024-08-09 MED ORDER — CYANOCOBALAMIN 1000 MCG/ML IJ SOLN
1000.0000 ug | INTRAMUSCULAR | 0 refills | Status: AC
  Filled 2024-08-09: qty 1, 30d supply, fill #0

## 2024-08-09 MED ORDER — MEDROXYPROGESTERONE ACETATE 2.5 MG PO TABS
2.5000 mg | ORAL_TABLET | Freq: Every morning | ORAL | 0 refills | Status: DC
  Filled 2024-08-09: qty 30, 30d supply, fill #0

## 2024-08-09 MED ORDER — BUSPIRONE HCL 7.5 MG PO TABS
7.5000 mg | ORAL_TABLET | Freq: Two times a day (BID) | ORAL | 0 refills | Status: DC
  Filled 2024-08-09: qty 60, 30d supply, fill #0

## 2024-08-09 NOTE — Telephone Encounter (Signed)
 Message sent to Pt, overdue for appt

## 2024-08-10 ENCOUNTER — Other Ambulatory Visit: Payer: Self-pay

## 2024-08-11 ENCOUNTER — Other Ambulatory Visit (HOSPITAL_BASED_OUTPATIENT_CLINIC_OR_DEPARTMENT_OTHER): Payer: Self-pay

## 2024-08-11 ENCOUNTER — Other Ambulatory Visit (HOSPITAL_COMMUNITY): Payer: Self-pay

## 2024-08-14 ENCOUNTER — Encounter: Payer: Self-pay | Admitting: Adult Health

## 2024-08-14 ENCOUNTER — Other Ambulatory Visit: Payer: Self-pay

## 2024-08-14 ENCOUNTER — Other Ambulatory Visit (HOSPITAL_COMMUNITY): Payer: Self-pay

## 2024-08-14 ENCOUNTER — Other Ambulatory Visit: Payer: Self-pay | Admitting: Family Medicine

## 2024-08-14 DIAGNOSIS — N951 Menopausal and female climacteric states: Secondary | ICD-10-CM

## 2024-08-14 DIAGNOSIS — F341 Dysthymic disorder: Secondary | ICD-10-CM

## 2024-08-14 MED ORDER — MEDROXYPROGESTERONE ACETATE 2.5 MG PO TABS
2.5000 mg | ORAL_TABLET | Freq: Every morning | ORAL | 0 refills | Status: DC
Start: 2024-08-14 — End: 2024-08-15
  Filled 2024-08-14: qty 30, 30d supply, fill #0

## 2024-08-14 MED ORDER — SERTRALINE HCL 100 MG PO TABS
200.0000 mg | ORAL_TABLET | Freq: Every morning | ORAL | 1 refills | Status: DC
  Filled 2024-08-14: qty 60, 30d supply, fill #0

## 2024-08-14 MED ORDER — ESTRADIOL 2 MG PO TABS
2.0000 mg | ORAL_TABLET | Freq: Every day | ORAL | 1 refills | Status: DC
  Filled 2024-08-14: qty 90, 90d supply, fill #0

## 2024-08-14 MED ORDER — BUSPIRONE HCL 7.5 MG PO TABS
7.5000 mg | ORAL_TABLET | Freq: Two times a day (BID) | ORAL | 0 refills | Status: DC
  Filled 2024-08-14 – 2024-08-15 (×2): qty 60, 30d supply, fill #0

## 2024-08-14 NOTE — Telephone Encounter (Signed)
 Copied from CRM [id number]. Topic: Clinical - Medication Refill >> Aug 14, 2024 11:54 AM Tanazia G wrote: Medication:  busPIRone  (BUSPAR ) 7.5 MG tablet   medroxyPROGESTERone  (PROVERA ) 2.5 MG tablet  sertraline  (ZOLOFT ) 100 MG tablet  estradiol  (ESTRACE ) 2 MG tablet   Has the patient contacted their pharmacy? Yes (Agent: If no, request that the patient contact the pharmacy for the refill. If patient does not wish to contact the pharmacy document the reason why and proceed with request.) (Agent: If yes, when and what did the pharmacy advise?)  This is the patient's preferred pharmacy:  [facility] [address] [place] [address] Phone: [phone] Fax: [phone]  Is this the correct pharmacy for this prescription? Yes If no, delete pharmacy and type the correct one.   Has the prescription been filled recently? Yes  Is the patient out of the medication? Yes  Has the patient been seen for an appointment in the last year OR does the patient have an upcoming appointment? Yes  Can we respond through MyChart? Yes  Agent: Please be advised that Rx refills may take up to 3 business days. We ask that you follow-up with your pharmacy.

## 2024-08-15 ENCOUNTER — Ambulatory Visit: Payer: Self-pay | Admitting: Family Medicine

## 2024-08-15 ENCOUNTER — Encounter: Payer: Self-pay | Admitting: Family Medicine

## 2024-08-15 ENCOUNTER — Other Ambulatory Visit (HOSPITAL_COMMUNITY): Payer: Self-pay

## 2024-08-15 ENCOUNTER — Ambulatory Visit: Admitting: Family Medicine

## 2024-08-15 ENCOUNTER — Other Ambulatory Visit: Payer: Self-pay

## 2024-08-15 VITALS — BP 120/80 | HR 61 | Temp 97.8°F | Resp 16 | Ht 64.0 in | Wt 174.6 lb

## 2024-08-15 DIAGNOSIS — F419 Anxiety disorder, unspecified: Secondary | ICD-10-CM | POA: Diagnosis not present

## 2024-08-15 DIAGNOSIS — N950 Postmenopausal bleeding: Secondary | ICD-10-CM | POA: Diagnosis not present

## 2024-08-15 DIAGNOSIS — N951 Menopausal and female climacteric states: Secondary | ICD-10-CM | POA: Diagnosis not present

## 2024-08-15 DIAGNOSIS — J45991 Cough variant asthma: Secondary | ICD-10-CM

## 2024-08-15 DIAGNOSIS — E039 Hypothyroidism, unspecified: Secondary | ICD-10-CM | POA: Diagnosis not present

## 2024-08-15 DIAGNOSIS — K219 Gastro-esophageal reflux disease without esophagitis: Secondary | ICD-10-CM | POA: Diagnosis not present

## 2024-08-15 DIAGNOSIS — F331 Major depressive disorder, recurrent, moderate: Secondary | ICD-10-CM

## 2024-08-15 DIAGNOSIS — J449 Chronic obstructive pulmonary disease, unspecified: Secondary | ICD-10-CM | POA: Insufficient documentation

## 2024-08-15 LAB — CBC
HCT: 41.4 % (ref 36.0–46.0)
Hemoglobin: 14 g/dL (ref 12.0–15.0)
MCHC: 33.9 g/dL (ref 30.0–36.0)
MCV: 89.9 fl (ref 78.0–100.0)
Platelets: 293 K/uL (ref 150.0–400.0)
RBC: 4.6 Mil/uL (ref 3.87–5.11)
RDW: 13.4 % (ref 11.5–15.5)
WBC: 7.4 K/uL (ref 4.0–10.5)

## 2024-08-15 LAB — BASIC METABOLIC PANEL WITH GFR
BUN: 15 mg/dL (ref 6–23)
CO2: 24 meq/L (ref 19–32)
Calcium: 9.6 mg/dL (ref 8.4–10.5)
Chloride: 104 meq/L (ref 96–112)
Creatinine, Ser: 1.19 mg/dL (ref 0.40–1.20)
GFR: 49.53 mL/min — ABNORMAL LOW (ref >60.00)
Glucose, Bld: 123 mg/dL — ABNORMAL HIGH (ref 70–99)
Potassium: 4.2 meq/L (ref 3.5–5.1)
Sodium: 138 meq/L (ref 135–145)

## 2024-08-15 LAB — TSH: TSH: 0.98 u[IU]/mL (ref 0.35–5.50)

## 2024-08-15 LAB — T4, FREE: Free T4: 0.73 ng/dL (ref 0.60–1.60)

## 2024-08-15 MED ORDER — LEVOTHYROXINE SODIUM 125 MCG PO TABS
125.0000 ug | ORAL_TABLET | Freq: Every morning | ORAL | 3 refills | Status: AC
Start: 2024-08-15 — End: ?
  Filled 2024-08-15 – 2024-08-18 (×2): qty 30, 30d supply, fill #0
  Filled 2024-09-11: qty 30, 30d supply, fill #1
  Filled 2024-10-08: qty 30, 30d supply, fill #2

## 2024-08-15 MED ORDER — SERTRALINE HCL 100 MG PO TABS
200.0000 mg | ORAL_TABLET | Freq: Every morning | ORAL | 3 refills | Status: AC
  Filled 2024-08-15: qty 180, 90d supply, fill #0
  Filled 2024-08-18: qty 60, 30d supply, fill #0
  Filled 2024-09-11: qty 60, 30d supply, fill #1
  Filled 2024-10-08: qty 60, 30d supply, fill #2

## 2024-08-15 MED ORDER — LAMOTRIGINE 100 MG PO TABS
100.0000 mg | ORAL_TABLET | Freq: Two times a day (BID) | ORAL | 2 refills | Status: AC
  Filled 2024-08-15 – 2024-08-18 (×2): qty 60, 30d supply, fill #0
  Filled 2024-09-11: qty 60, 30d supply, fill #1
  Filled 2024-10-08: qty 60, 30d supply, fill #2

## 2024-08-15 MED ORDER — MEDROXYPROGESTERONE ACETATE 2.5 MG PO TABS
2.5000 mg | ORAL_TABLET | Freq: Every morning | ORAL | 1 refills | Status: AC
  Filled 2024-08-15 – 2024-09-01 (×2): qty 90, 90d supply, fill #0

## 2024-08-15 MED ORDER — LANSOPRAZOLE 30 MG PO CPDR
30.0000 mg | DELAYED_RELEASE_CAPSULE | Freq: Two times a day (BID) | ORAL | 3 refills | Status: AC
Start: 2024-08-15 — End: ?
  Filled 2024-08-15 – 2024-08-18 (×2): qty 60, 30d supply, fill #0
  Filled 2024-09-11: qty 60, 30d supply, fill #1
  Filled 2024-10-08: qty 60, 30d supply, fill #2

## 2024-08-15 MED ORDER — BUSPIRONE HCL 7.5 MG PO TABS
7.5000 mg | ORAL_TABLET | Freq: Two times a day (BID) | ORAL | 3 refills | Status: AC
  Filled 2024-08-15: qty 180, 90d supply, fill #0
  Filled 2024-08-18: qty 60, 30d supply, fill #0
  Filled 2024-09-11: qty 60, 30d supply, fill #1
  Filled 2024-10-08: qty 60, 30d supply, fill #2

## 2024-08-15 MED ORDER — ESTRADIOL 2 MG PO TABS
2.0000 mg | ORAL_TABLET | Freq: Every day | ORAL | 2 refills | Status: DC
  Filled 2024-08-15 – 2024-09-01 (×2): qty 90, 90d supply, fill #0

## 2024-08-15 NOTE — Assessment & Plan Note (Signed)
 Problem has been well controlled with estradiol  and progesterone.   She understands risks of treatment. She has not tolerated decreasing dose. She is overdue for mammogram, I strongly recommend scheduling before she is due for refills.

## 2024-08-15 NOTE — Assessment & Plan Note (Signed)
 Last TSH 0.6 in 10/2022. Continue levothyroxine  125 mcg daily. Further recommendation will be given according to TSI result.

## 2024-08-15 NOTE — Assessment & Plan Note (Signed)
 Problem is stable, she uses Albuterol  inh about 2 times per week. She thinks her SLE is contributing to respiratory symptoms. Currently she recovering from a URI, reports that symptoms are improving. Continue albuterol  inhaler 1 to 2 puff every 6 hours as needed.

## 2024-08-15 NOTE — Progress Notes (Signed)
 Chief Complaint  Patient presents with   Medical Management of Chronic Issues   Discussed the use of AI scribe software for clinical note transcription with the patient, who gave verbal consent to proceed.  History of Present Illness Jo [name] is a 63 year old female a PMHx significant for seasonal allergies, SVT, SLE, reflex sympathetic dystrophy/complex regional pain syndrome I, chronic pain, GERD, migraine headaches, fibromyalgia, depression, and anxiety here today for follow up.  Last seen on 10/20/2023 for cough. Last follow-up appointment on 11/11/2022. Since her last visit she has followed with cardiologist and orthopedist.  Depression and anxiety: Currently she is on Lamictal  100 mg twice daily, sertraline  100 mg 2 tablets daily, and BuSpar  7.5 mg twice daily. She has been on sertraline  since 2013-2014. She has tolerated medication well, no side effects. Chronic pain aggravates problem, tried of hurting.     08/15/2024    2:10 PM 09/21/2023   12:36 PM 05/26/2023    2:44 PM 12/14/2022   12:28 PM 05/12/2022    2:26 PM  Depression screen PHQ 2/9  Decreased Interest 2 0 1 0 0  Down, Depressed, Hopeless 0 0 0 0 0  PHQ - 2 Score 2 0 1 0 0  Altered sleeping 2 0 3 0 0  Tired, decreased energy 3 0 3 0 2  Change in appetite 2 0 2 0 0  Feeling bad or failure about yourself  0 0 0 0 1  Trouble concentrating 0 0 0 0 0  Moving slowly or fidgety/restless 0 0 0 0 1  Suicidal thoughts 0 0 0 0 0  PHQ-9 Score 9 0  9  0  4   Difficult doing work/chores Somewhat difficult Not difficult at all Not difficult at all  Somewhat difficult     Data saved with a previous flowsheet row definition      08/15/2024    2:13 PM 05/26/2023    2:44 PM 10/29/2021   11:01 AM 02/05/2021    4:39 PM  GAD 7 : Generalized Anxiety Score  Nervous, Anxious, on Edge 2 0 0 0  Control/stop worrying 1 0 0 0  Worry too much - different things 1 0 1 2  Trouble relaxing 2 1 1    Restless 0 0 0   Easily annoyed  or irritable 2 0 1 1  Afraid - awful might happen 0 0 0 1  Total GAD 7 Score 8 1 3    Anxiety Difficulty Somewhat difficult Not difficult at all     -She states that she is experiencing a significant flare-up of her rheumatoid arthritis and lupus, characterized by extreme fatigue and a sense of being in a 'downward spiral.' These symptoms have been present for a few weeks, and she reports that her current flare is particularly severe. She feels extremely cold during episodes, with chattering teeth and profound tiredness. She recalls a past episode of low red and white blood cells requiring infusions at the hematologist's office and is concerned these symptoms might be recurring. Follows with rheumatologist regularly and has CBC among other labs done , last time 06/2024.  Component     Latest Ref Rng 08/13/2020 01/07/2021 03/11/2021 04/28/2021 08/28/2021  WBC     4.0 - 10.5 K/uL 5.0  5.1  4.9  4.3  6.2 (E)  RBC     3.87 - 5.11 Mil/uL 4.01  4.23  4.42  4.16    Hemoglobin     12.0 - 15.0 g/dL 12.5  13.2  13.2  12.7  13.2 (E)  HCT     36.0 - 46.0 % 38.4  40.1  39.8  37.7  40 (E)  MCV     78.0 - 100.0 fl 95.8  94.8  90  90.6    MCHC     30.0 - 36.0 g/dL 67.3  67.0  66.7  66.2    RDW     11.5 - 15.5 % 13.1  12.5  12.5  13.0    Platelets     150.0 - 400.0 K/uL 257  288  291  241  272 (E)  Neutrophils     43.0 - 77.0 % 55  66   58    Lymphocytes     12.0 - 46.0 % 33  27   30    Monocytes Relative     3.0 - 12.0 % 6  5   8     Eosinophil     0.0 - 5.0 % 5  1   3     Basophil     0.0 - 3.0 % 1  1   1     NEUT#     1.4 - 7.7 K/uL 2.8  3.4   2.5    Lymphs Abs     0.7 - 4.0 K/uL 1.7  1.4   1.3    Monocyte #     0.1 - 1.0 K/uL 0.3  0.3   0.4    Eosinophils Absolute     0.0 - 0.7 K/uL 0.3  0.0   0.1    Basophils Absolute     0.0 - 0.1 K/uL 0.1  0.0   0.0    MCH     26.0 - 34.0 pg 31.2  31.2  29.9  30.5    nRBC     0.0 - 0.2 % 0.0  0.0   0.0    Immature Granulocytes     % 0  0   0    Abs  Immature Granulocytes     0.00 - 0.07 K/uL 0.02  0.02   0.01     Component     Latest Ref Rng 10/23/2021 12/02/2021 02/04/2022 11/11/2022  WBC     4.0 - 10.5 K/uL 4.3  5.5 (E) 5.5  6.3   RBC     3.87 - 5.11 Mil/uL 4.25  4.44 (E) 4.43  4.57   Hemoglobin     12.0 - 15.0 g/dL 87.3  86.8 (E) 86.6  86.3   HCT     36.0 - 46.0 % 38.4  40 (E) 39.9  41.5   MCV     78.0 - 100.0 fl 90.3   90.0  90.8   MCHC     30.0 - 36.0 g/dL 67.2   66.6  67.0   RDW     11.5 - 15.5 % 13.6   13.3  13.5   Platelets     150.0 - 400.0 K/uL 231.0  241 (E) 274.0  299.0   Neutrophils     43.0 - 77.0 % 56.8   67.6    Lymphocytes     12.0 - 46.0 % 32.7   22.9    Monocytes Relative     3.0 - 12.0 % 7.5   7.1    Eosinophil     0.0 - 5.0 % 2.3   1.9    Basophil     0.0 - 3.0 % 0.7   0.5  NEUT#     1.4 - 7.7 K/uL 2.4   3.7    Lymphs Abs     0.7 - 4.0 K/uL 1.4   1.3    Monocyte #     0.1 - 1.0 K/uL 0.3   0.4    Eosinophils Absolute     0.0 - 0.7 K/uL 0.1   0.1    Basophils Absolute     0.0 - 0.1 K/uL 0.0   0.0    MCH     26.0 - 34.0 pg      nRBC     0.0 - 0.2 %      Immature Granulocytes     %      Abs Immature Granulocytes     0.00 - 0.07 K/uL        Cough variant asthma: She uses an albuterol  inhaler a couple of times per week when her cough worsens. She believes her respiratory symptoms are due to her lupus and rheumatoid arthritis, which she feels have affected her lungs. At this time she is recovering from a URI, still having nasal congestion, rhinorrhea,and productive cough. States that symptoms are gradually improving. States that her recovery is often prolonged due to being on immunosuppressants like Benlysta . No current fever, as she has taken Tylenol and cough medicine.  -Vasomotor menopausal symptoms on hormonal therapy. She is on Estradiol  2 mg daily and  Medroxyprogesterone   2.5 mg daily, has been consistent with taking medications daily. It keeps her symptoms well controlled.. She mentions  that she experienced a 'mini period' last week, it lasted about 3 days. She has a history of fibroid removal. She has not had a mammogram since 2023.   No pelvic pain associated with her recent bleeding episode.  Hypothyroidism: She is on Levothyroxine  125 mcg daily.  Lab Results  Component Value Date   TSH 0.63 11/11/2022   Chronic pain disorder on Hydromorphone  and Methadone . Follows with pain management monthly. Reports that pain is not well controlled.  -GERD on Prevacid  30 mg bid, which is still helping. No heartburn or acid reflux.  Review of Systems  Constitutional:  Positive for fatigue. Negative for activity change, appetite change, chills and fever.  HENT:  Negative for mouth sores and sore throat.   Respiratory:  Positive for cough.   Gastrointestinal:  Negative for abdominal pain, nausea and vomiting.  Endocrine: Negative for cold intolerance and heat intolerance.  Genitourinary:  Negative for decreased urine volume, dysuria and hematuria.  Musculoskeletal:  Positive for arthralgias and myalgias.  Skin:  Negative for rash.  Neurological:  Negative for syncope, facial asymmetry and weakness.  Psychiatric/Behavioral:  Negative for confusion and hallucinations. The patient is nervous/anxious.   See other pertinent positives and negatives in HPI.  Current Outpatient Medications on File Prior to Visit  Medication Sig Dispense Refill   albuterol  (VENTOLIN  HFA) 108 (90 Base) MCG/ACT inhaler TAKE 2 PUFFS BY MOUTH EVERY 6 HOURS AS NEEDED FOR WHEEZE OR SHORTNESS OF BREATH 8.5 each 0   B-D 3CC LUER-LOK SYR 23GX1 23G X 1 3 ML MISC Use to inject B12 once a month 3 each 3   Belimumab  (BENLYSTA ) 200 MG/ML SOSY Inject 1 ml subcutaneously once a week 30 days. 4 mL 10   betamethasone  dipropionate 0.05 % cream Apply to the affectea area(s) twice daily as needed for knuckle flare ups 165 g 0   betamethasone  dipropionate 0.05 % lotion Apply topically as needed.     cholecalciferol  (VITAMIN  D) 1000 UNITS tablet Take 2,000 Units by mouth daily.     Cranberry 200 MG CAPS Take 200 mg by mouth 2 (two) times daily.     cyanocobalamin  (VITAMIN B12) 1000 MCG/ML injection Inject 1 mL (1,000 mcg total) into the muscle every 30 (thirty) days. Needs appt 1 mL 0   fluticasone  (CUTIVATE ) 0.05 % cream Apply to the affected areas twice daily for 1 week, stop for 1 week, then repeat as needed for flare up. 60 g 2   fluticasone -salmeterol (ADVAIR HFA) 115-21 MCG/ACT inhaler Inhale 2 puffs into the lungs 2 (two) times daily. 12 g 1   folic acid  (FOLVITE ) 1 MG tablet Take 1 tablet (1 mg total) by mouth 2 (two) times daily. 60 tablet 2   HYDROmorphone  (DILAUDID ) 4 MG tablet Take by mouth as needed for severe pain.     HYDROmorphone  (DILAUDID ) 4 MG tablet Take 1 tablet (4 mg total) by mouth 3 (three) times daily as needed for pain 60 tablet 0   HYDROmorphone  (DILAUDID ) 4 MG tablet Take 1 tablet (4 mg total) by mouth 3 (three) times daily as needed for pain. 60 tablet 0   KETOCONAZOLE , TOPICAL, 1 % SHAM Use up to twice a week as needed     levocetirizine (XYZAL ) 5 MG tablet Take 1 tablet (5 mg total) by mouth every evening. 90 tablet 2   LYSINE PO Take 1 tablet by mouth daily.     methadone  (DOLOPHINE ) 5 MG tablet Take 1 tablet by mouth 4 (four) times daily as needed.     methadone  (DOLOPHINE ) 5 MG tablet Take 1 tablet (5 mg total) by mouth every 6 (six) hours. 120 tablet 0   methadone  (DOLOPHINE ) 5 MG tablet Take 1 tablet (5 mg total) by mouth every 6 (six) hours. 120 tablet 0   methadone  (DOLOPHINE ) 5 MG tablet Take 1 tablet (5 mg total) by mouth every 6 (six) hours. 120 tablet 0   methadone  (DOLOPHINE ) 5 MG tablet Take 1 tablet (5 mg total) by mouth every 6 (six) hours. 120 tablet 0   methadone  (DOLOPHINE ) 5 MG tablet Take 1 tablet (5 mg total) by mouth every 6 (six) hours. 120 tablet 0   methadone  (DOLOPHINE ) 5 MG tablet Take 1 tablet (5 mg total) by mouth every 6 (six) hours. 120 tablet 0    metoCLOPramide  (REGLAN ) 10 MG tablet Take 1 tablet (10 mg total) by mouth every 6 (six) hours as needed for nausea. 30 tablet 0   NEEDLE, DISP, 25 G (B-D DISP NEEDLE 25GX1) 25G X 1 MISC To use with B12. 3 each 3   ondansetron  (ZOFRAN ) 4 MG tablet Take 4 mg by mouth 3 (three) times daily as needed.     ondansetron  (ZOFRAN ) 4 MG tablet Take 1 tablet (4 mg total) by mouth 3 (three) times daily as needed. 45 tablet 0   ondansetron  (ZOFRAN ) 4 MG tablet Take 1 tablet (4 mg total) by mouth 3 (three) times daily as needed. 45 tablet 1   Sodium Fluoride  (CLINPRO  5000) 1.1 % PSTE USE AS DIRECTED. 100 mL 3   TH FAMOTIDINE 10 PO Take 10 mg by mouth once a week. 1-2 tabs once weekly 96min-1hr prior to Benlysta  injection to aid with injection site rxn     tiZANidine  (ZANAFLEX ) 4 MG tablet Take 3 tablets (12 mg total) by mouth at bedtime. 270 tablet 1   tiZANidine  (ZANAFLEX ) 4 MG tablet Take 3 tablets (12 mg total) by mouth at bedtime. 270  tablet 1   tiZANidine  (ZANAFLEX ) 4 MG tablet Take 3 tablets (12 mg total) by mouth at bedtime. 270 tablet 1   tiZANidine  (ZANAFLEX ) 4 MG tablet Take 3 tablets (12 mg total) by mouth at bedtime. 270 tablet 1   tiZANidine  (ZANAFLEX ) 4 MG tablet Take 3 tablets (12 mg total) by mouth at bedtime. 270 tablet 1   tiZANidine  (ZANAFLEX ) 4 MG tablet Take 3 tablets (12 mg total) by mouth at bedtime. 270 tablet 1   topiramate  (TOPAMAX ) 25 MG tablet Take 1 tablet (25 mg total) by mouth every morning AND 4 tablets (100 mg total) at bedtime. 450 tablet 1   topiramate  (TOPAMAX ) 25 MG tablet Take 1 tablet (25 mg total) by mouth every morning AND 4 tablets (100 mg total) at bedtime. 450 tablet 0   topiramate  (TOPAMAX ) 25 MG tablet Take 1 tablet (25 mg total) by mouth every morning AND 4 tablets (100 mg total) at bedtime. 450 tablet 1   topiramate  (TOPAMAX ) 25 MG tablet 1 tablet (25 mg total) every morning and 4 tablets (100 mg total) at bedtime. 150 tablet 0   topiramate  (TOPAMAX ) 25 MG tablet  Take 1 tablet (25 mg total) by mouth every morning AND 4 tablets (100 mg total) at bedtime. 450 tablet 1   topiramate  (TOPAMAX ) 25 MG tablet Take 1 tablet (25 mg total) by mouth in the morning AND 4 tablets (100 mg total) at bedtime. 450 tablet 1   triamcinolone  (KENALOG ) 0.025 % cream Apply to entire nail bed twice daily for 2 weeks on and 1 week off, then repeat cycle 80 g 0   triamcinolone  cream (KENALOG ) 0.1 % daily as needed.     Belimumab  (BENLYSTA ) 200 MG/ML SOSY Inject 1 ml subcutaneously once a week 30 days 4 mL 10   No current facility-administered medications on file prior to visit.    Past Medical History:  Diagnosis Date   Allergy    Anemia    Anxiety    Anxiety and depression    Arrhythmia 2021   Arthritis    Asthma    Cervical cancer [facility])    age 38    Chronic headaches    Colon polyp    COPD (chronic obstructive pulmonary disease) (HCC)    no o2   Depression    Dysrhythmia    Endometriosis    Fibromyalgia    Fundic gland polyps of stomach, benign    Gastritis    GERD (gastroesophageal reflux disease)    H. pylori infection 2013   H/O gastric ulcer 1985   History of anorexia nervosa    History of bulimia    History of pneumonia    Hypothyroidism    Irritable bowel syndrome (IBS)    Obesity    Personal history of other mental and behavioral disorders 01/14/2008   Qualifier: Diagnosis of  By: Avram MD, NOLIA Lupita BRAVO   Overview:  Overview:  Annotation: BULIMIA Qualifier: Diagnosis of  By: Josphine Penny Alexandro Olivia    PONV (postoperative nausea and vomiting)    iv phenergan  helps   REFLEX SYMPATHETIC DYSTROPHY 02/26/2009   Qualifier: Diagnosis of  By: Avram MD, NOLIA Lupita BRAVO    Rheumatoid arteritis (HCC)    RSD (reflex sympathetic dystrophy)    Sleep apnea    Systemic lupus (HCC) 2021   Ulcer    gastric, duodenal ulcer   Allergies  Allergen Reactions   Bee Pollen Anaphylaxis   Diazepam     sucidal depression  Prednisone Nausea And Vomiting    Per  pt can not take oral steroids    Amoxicillin      Severe diarrhea At high dose    Emetrol     Other reaction(s): GI upset   Gabapentin     Doesn't remember; didn't tolerate   Imuran [Azathioprine]     Patient states this was discontinued due to liver problems   Methotrexate  And Trimetrexate     diarrhea   Morphine     n & v   Propoxyphene N-Acetaminophen     n & V   Sulfa Antibiotics     MIGRAINES   Tetracyclines & Related     N & v   Propoxyphene Other (See Comments)    Social History   Socioeconomic History   Marital status: Divorced    Spouse name: Not on file   Number of children: 0   Years of education: 16   Highest education level: Bachelor's degree (e.g., BA, AB, BS)  Occupational History   Occupation: Public House Manager: UNEMPLOYED  Tobacco Use   Smoking status: Never   Smokeless tobacco: Never  Vaping Use   Vaping status: Never Used  Substance and Sexual Activity   Alcohol use: No   Drug use: No   Sexual activity: Not Currently    Birth control/protection: None  Other Topics Concern   Not on file  Social History Narrative   Lives at home alone.   Caffeine use: Drinks coffee- 1 cup in morning   Ginger ale   Social Drivers of Health   Financial Resource Strain: Low Risk  (09/20/2023)   Overall Financial Resource Strain (CARDIA)    Difficulty of Paying Living Expenses: Not very hard  Food Insecurity: No Food Insecurity (11/10/2022)   Hunger Vital Sign    Worried About Running Out of Food in the Last Year: Never true    Ran Out of Food in the Last Year: Never true  Transportation Needs: No Transportation Needs (09/20/2023)   PRAPARE - Administrator, Civil Service (Medical): No    Lack of Transportation (Non-Medical): No  Physical Activity: Unknown (09/20/2023)   Exercise Vital Sign    Days of Exercise per Week: 0 days    Minutes of Exercise per Session: Not on file  Stress: No Stress Concern Present (09/20/2023)    [employer] of Occupational Health - Occupational Stress Questionnaire    Feeling of Stress : Only a little  Social Connections: Moderately Integrated (09/20/2023)   Social Connection and Isolation Panel    Frequency of Communication with Friends and Family: More than three times a week    Frequency of Social Gatherings with Friends and Family: Once a week    Attends Religious Services: More than 4 times per year    Active Member of [employer] or Organizations: Yes    Attends Banker Meetings: More than 4 times per year    Marital Status: Divorced    Vitals:   08/15/24 1407  BP: 120/80  Pulse: 61  Resp: 16  Temp: 97.8 F (36.6 C)  SpO2: 97%   Body mass index is 29.97 kg/m.  Physical Exam Vitals and nursing note reviewed.  Constitutional:      General: She is not in acute distress.    Appearance: She is well-developed. She is not ill-appearing.  HENT:     Head: Normocephalic and atraumatic.     Nose: Rhinorrhea present.  Mouth/Throat:     Mouth: Mucous membranes are moist.     Pharynx: Uvula midline. Postnasal drip present.  Eyes:     Conjunctiva/sclera: Conjunctivae normal.  Cardiovascular:     Rate and Rhythm: Normal rate and regular rhythm. Occasional Extrasystoles (x 1) are present.    Heart sounds: No murmur heard. Pulmonary:     Effort: Pulmonary effort is normal. No respiratory distress.     Breath sounds: Normal breath sounds. No stridor.     Comments: A productive cough a few times during her visit. Abdominal:     Palpations: Abdomen is soft. There is no mass.     Tenderness: There is no abdominal tenderness.  Musculoskeletal:     Right lower leg: No edema.     Left lower leg: No edema.  Lymphadenopathy:     Cervical: No cervical adenopathy.  Skin:    General: Skin is warm.     Findings: No erythema or rash.  Neurological:     General: No focal deficit present.     Mental Status: She is alert and oriented to person, place, and time.      Gait: Gait normal.  Psychiatric:        Mood and Affect: Mood is anxious. Affect is tearful.    ASSESSMENT AND PLAN:  Sabrena Gavitt was seen today for medical management of chronic issues.  Diagnoses and all orders for this visit:  Orders Placed This Encounter  Procedures   CBC   Basic metabolic panel with GFR   TSH   T4, free   Ambulatory referral to Gynecology   Lab Results  Component Value Date   TSH 0.98 08/15/2024   Lab Results  Component Value Date   NA 138 08/15/2024   CL 104 08/15/2024   K 4.2 08/15/2024   CO2 24 08/15/2024   BUN 15 08/15/2024   CREATININE 1.19 08/15/2024   GFR 49.53 (L) 08/15/2024   CALCIUM 9.6 08/15/2024   ALBUMIN 4.0 12/02/2021   GLUCOSE 123 (H) 08/15/2024   Lab Results  Component Value Date   WBC 7.4 08/15/2024   HGB 14.0 08/15/2024   HCT 41.4 08/15/2024   MCV 89.9 08/15/2024   PLT 293.0 08/15/2024   Cough variant asthma Assessment & Plan: Problem is stable, she uses Albuterol  inh about 2 times per week. She thinks her SLE is contributing to respiratory symptoms. Currently she recovering from a URI, reports that symptoms are improving. Continue albuterol  inhaler 1 to 2 puff every 6 hours as needed.   Hypothyroidism, unspecified type Assessment & Plan: Last TSH 0.6 in 10/2022. Continue levothyroxine  125 mcg daily. Further recommendation will be given according to TSI result.  Orders: -     Basic metabolic panel with GFR; Future -     TSH; Future -     T4, free; Future  Postmenopausal bleeding Assessment & Plan: She reports an episode of vaginal bleeding last week that lasted 3 days. Recommend consultation with gynecologist, referral placed.  Orders: -     CBC; Future -     Ambulatory referral to Gynecology  Menopausal vasomotor syndrome Assessment & Plan: Problem has been well controlled with estradiol  and progesterone.   She understands risks of treatment. She has not tolerated decreasing dose. She is overdue for  mammogram, I strongly recommend scheduling before she is due for refills.  Orders: -     Estradiol ; Take 1 tablet (2 mg total) by mouth daily.  Dispense: 90 tablet; Refill: 2 -  medroxyPROGESTERone  Acetate; Take 1 tablet (2.5 mg total) by mouth every morning. Needs appt  Dispense: 90 tablet; Refill: 1  Acquired hypothyroidism Assessment & Plan: Last TSH 0.6 in 10/2022. Continue levothyroxine  125 mcg daily. Further recommendation will be given according to TSI result.  Orders: -     Levothyroxine  Sodium; Take 1 tablet (125 mcg total) by mouth every morning.  Dispense: 90 tablet; Refill: 3  Gastro-esophageal reflux disease without esophagitis Assessment & Plan: Problem is well-controlled. Continue Prevacid  30 mg twice daily before meals. GERD precautions also recommended. As far as problem is stable, she can continue annual follow-ups.  Orders: -     Lansoprazole ; Take 1 capsule (30 mg total) by mouth 2 (two) times daily before a meal.  Dispense: 180 capsule; Refill: 3  Anxiety disorder, unspecified type Assessment & Plan: Not well controlled but stable. She is not interested in CBT Continue Sertraline  200 mg and Buspar  7.5 mg bid.  Orders: -     busPIRone  HCl; Take 1 tablet (7.5 mg total) by mouth 2 (two) times daily.  Dispense: 180 tablet; Refill: 3 -     Sertraline  HCl; Take 2 tablets (200 mg total) by mouth every morning.  Dispense: 180 tablet; Refill: 3  Moderate episode of recurrent major depressive disorder [facility]) Assessment & Plan: Problem has been stable. Aggravated by her chronic co-morbilities, specially chronic pain. Denies SI/HI. She does not want to establish with psychiatrist at this time and not interested in CBT. Continue sertraline  100 mg 2 tablets daily ,Buspar  7.5 mg bid,and Lamictal  100 mg twice daily. She prefers annual follow ups. States that due to pain, so times it is difficult to come to the office. Instructed about warning signs.  Orders: -      Estradiol ; Take 1 tablet (2 mg total) by mouth daily.  Dispense: 90 tablet; Refill: 2 -     lamoTRIgine ; Take 1 tablet (100 mg total) by mouth in the morning at breakfast and at bedtime.  Dispense: 180 tablet; Refill: 2 -     Sertraline  HCl; Take 2 tablets (200 mg total) by mouth every morning.  Dispense: 180 tablet; Refill: 3   I personally spent a total of 44 minutes in the care of the patient today including preparing to see the patient, getting/reviewing separately obtained history, performing a medically appropriate exam/evaluation, counseling and educating, placing orders, and documenting clinical information in the EHR.  Return in about 1 year (around 08/15/2025) for chronic problems, before if needed..  Marra Fraga G. Dorri Ozturk, MD  [facility]. [place] office.

## 2024-08-15 NOTE — Assessment & Plan Note (Signed)
 She reports an episode of vaginal bleeding last week that lasted 3 days. Recommend consultation with gynecologist, referral placed.

## 2024-08-15 NOTE — Assessment & Plan Note (Signed)
 Problem is well-controlled. Continue Prevacid  30 mg twice daily before meals. GERD precautions also recommended. As far as problem is stable, she can continue annual follow-ups.

## 2024-08-15 NOTE — Assessment & Plan Note (Signed)
 Not well controlled but stable. She is not interested in CBT Continue Sertraline  200 mg and Buspar  7.5 mg bid.

## 2024-08-15 NOTE — Assessment & Plan Note (Signed)
 Problem has been stable. Aggravated by her chronic co-morbilities, specially chronic pain. Denies SI/HI. She does not want to establish with psychiatrist at this time and not interested in CBT. Continue sertraline  100 mg 2 tablets daily ,Buspar  7.5 mg bid,and Lamictal  100 mg twice daily. She prefers annual follow ups. States that due to pain, so times it is difficult to come to the office. Instructed about warning signs.

## 2024-08-16 ENCOUNTER — Ambulatory Visit: Admitting: Family Medicine

## 2024-08-16 ENCOUNTER — Other Ambulatory Visit (HOSPITAL_COMMUNITY): Payer: Self-pay

## 2024-08-16 ENCOUNTER — Other Ambulatory Visit: Payer: Self-pay

## 2024-08-18 ENCOUNTER — Other Ambulatory Visit: Payer: Self-pay

## 2024-08-18 ENCOUNTER — Encounter: Payer: Self-pay | Admitting: Adult Health

## 2024-08-21 ENCOUNTER — Other Ambulatory Visit: Payer: Self-pay

## 2024-08-21 ENCOUNTER — Other Ambulatory Visit (HOSPITAL_COMMUNITY): Payer: Self-pay

## 2024-08-22 ENCOUNTER — Other Ambulatory Visit: Payer: Self-pay

## 2024-08-29 ENCOUNTER — Encounter

## 2024-08-29 ENCOUNTER — Other Ambulatory Visit (HOSPITAL_COMMUNITY): Payer: Self-pay

## 2024-08-29 ENCOUNTER — Other Ambulatory Visit: Payer: Self-pay

## 2024-08-31 ENCOUNTER — Encounter

## 2024-09-01 ENCOUNTER — Ambulatory Visit: Attending: Cardiovascular Disease

## 2024-09-01 ENCOUNTER — Other Ambulatory Visit: Payer: Self-pay

## 2024-09-01 ENCOUNTER — Other Ambulatory Visit (HOSPITAL_COMMUNITY): Payer: Self-pay

## 2024-09-01 DIAGNOSIS — R55 Syncope and collapse: Secondary | ICD-10-CM

## 2024-09-02 LAB — CUP PACEART REMOTE DEVICE CHECK
Date Time Interrogation Session: 20251211230859
Implantable Pulse Generator Implant Date: 20221006

## 2024-09-05 ENCOUNTER — Other Ambulatory Visit: Payer: Self-pay

## 2024-09-05 ENCOUNTER — Encounter: Payer: Self-pay | Admitting: Adult Health

## 2024-09-05 ENCOUNTER — Encounter: Payer: Self-pay | Admitting: Internal Medicine

## 2024-09-05 ENCOUNTER — Other Ambulatory Visit (HOSPITAL_COMMUNITY): Payer: Self-pay

## 2024-09-05 ENCOUNTER — Ambulatory Visit: Admitting: Internal Medicine

## 2024-09-05 VITALS — BP 120/84 | HR 76 | Temp 98.7°F | Wt 174.0 lb

## 2024-09-05 DIAGNOSIS — J069 Acute upper respiratory infection, unspecified: Secondary | ICD-10-CM

## 2024-09-05 MED ORDER — TOPIRAMATE 25 MG PO TABS
ORAL_TABLET | ORAL | 1 refills | Status: AC
  Filled 2024-09-05: qty 450, 90d supply, fill #0

## 2024-09-05 MED ORDER — METHADONE HCL 5 MG PO TABS: 5.0000 mg | ORAL_TABLET | Freq: Four times a day (QID) | ORAL | 0 refills | Status: DC

## 2024-09-05 MED ORDER — HYDROCOD POLI-CHLORPHE POLI ER 10-8 MG/5ML PO SUER
5.0000 mL | Freq: Every evening | ORAL | 0 refills | Status: AC | PRN
  Filled 2024-09-05: qty 70, 14d supply, fill #0

## 2024-09-05 MED ORDER — TIZANIDINE HCL 4 MG PO TABS
12.0000 mg | ORAL_TABLET | Freq: Every day | ORAL | 1 refills | Status: AC
  Filled 2024-09-05: qty 270, 90d supply, fill #0
  Filled 2024-09-11: qty 90, 30d supply, fill #0
  Filled 2024-10-08: qty 90, 30d supply, fill #1

## 2024-09-05 MED ORDER — HYDROMORPHONE HCL 4 MG PO TABS: 4.0000 mg | ORAL_TABLET | Freq: Three times a day (TID) | ORAL | 0 refills | Status: DC | PRN

## 2024-09-05 MED ORDER — HYDROMORPHONE HCL 4 MG PO TABS
4.0000 mg | ORAL_TABLET | Freq: Three times a day (TID) | ORAL | 0 refills | Status: AC | PRN
  Filled 2024-09-05: qty 60, 20d supply, fill #0

## 2024-09-05 NOTE — Progress Notes (Signed)
 Established Patient Office Visit     CC/Reason for Visit: URI symptoms  HPI: Jo [name] is a 63 y.o. female who is coming in today for the above mentioned reasons. Past Medical History is significant for: Cough, congestion, postnasal drip.  She is immunocompromised due to Biologics that she is on for lupus and RA.  Symptoms have been present for about 15 days.   Past Medical/Surgical History: Past Medical History:  Diagnosis Date   Allergy    Anemia    Anxiety    Anxiety and depression    Arrhythmia 2021   Arthritis    Asthma    Cervical cancer (HCC)    age 66    Chronic headaches    Colon polyp    COPD (chronic obstructive pulmonary disease) (HCC)    no o2   Depression    Dysrhythmia    Endometriosis    Fibromyalgia    Fundic gland polyps of stomach, benign    Gastritis    GERD (gastroesophageal reflux disease)    H. pylori infection 2013   H/O gastric ulcer 1985   History of anorexia nervosa    History of bulimia    History of pneumonia    Hypothyroidism    Irritable bowel syndrome (IBS)    Obesity    Personal history of other mental and behavioral disorders 01/14/2008   Qualifier: Diagnosis of  By: Avram MD, NOLIA Lupita BRAVO   Overview:  Overview:  Annotation: BULIMIA Qualifier: Diagnosis of  By: Josphine Penny Alexandro Olivia    PONV (postoperative nausea and vomiting)    iv phenergan  helps   REFLEX SYMPATHETIC DYSTROPHY 02/26/2009   Qualifier: Diagnosis of  By: Avram MD, NOLIA Lupita E    Rheumatoid arteritis (HCC)    RSD (reflex sympathetic dystrophy)    Sleep apnea    Systemic lupus (HCC) 2021   Ulcer    gastric, duodenal ulcer    Past Surgical History:  Procedure Laterality Date   CERVICAL SPINE SURGERY  1990   CERVIX SURGERY     CHOLECYSTECTOMY     COLONOSCOPY     HYSTEROSCOPY WITH D & C N/A 11/11/2021   Procedure: DILATATION AND CURETTAGE /HYSTEROSCOPY;  Surgeon: Cleatus Moccasin, MD;  Location: [facility];  Service: Gynecology;   Laterality: N/A;   KNEE ARTHROSCOPY  04/21/2012   right   LAPAROSCOPIC NISSEN FUNDOPLICATION  09/05/2007   Dr. Donnice Lunger   laparoscopy     for endometriosis   LOOP RECORDER INSERTION Left 06/2021   lumbar spondylosis injected  2013   Dr. Cesario   NASAL SINUS SURGERY     THORACOSCOPY W/ THORACIC SYMPATHECTOMY     for RSD   TONSILLECTOMY     UPPER GASTROINTESTINAL ENDOSCOPY      Social History:  reports that she has never smoked. She has never used smokeless tobacco. She reports that she does not drink alcohol and does not use drugs.  Allergies: Allergies[1]  Family History:  Family History  Problem Relation Age of Onset   Colon polyps Mother        part of intestines removed   Raynaud syndrome Mother    Irritable bowel syndrome Mother    Other Mother        cold agluten   Stroke Mother    Colon polyps Sister    Breast cancer Maternal Aunt        first time 40s second time 11s   Lung  cancer Maternal Grandfather    Colon polyps Brother    Kidney Stones Brother    Colon cancer Neg Hx    Esophageal cancer Neg Hx    Rectal cancer Neg Hx    Stomach cancer Neg Hx    Migraines Neg Hx    Seizures Neg Hx     Current Medications[2]  Review of Systems:  Negative unless indicated in HPI.   Physical Exam: Vitals:   09/05/24 1334  BP: 120/84  Pulse: 76  Temp: 98.7 F (37.1 C)  TempSrc: Oral  SpO2: 98%  Weight: 174 lb (78.9 kg)    Body mass index is 29.87 kg/m.   Physical Exam Vitals reviewed.  Constitutional:      Appearance: Normal appearance.  HENT:     Right Ear: Tympanic membrane, ear canal and external ear normal.     Left Ear: Tympanic membrane, ear canal and external ear normal.     Mouth/Throat:     Mouth: Mucous membranes are moist.     Pharynx: Oropharynx is clear.  Eyes:     Conjunctiva/sclera: Conjunctivae normal.     Pupils: Pupils are equal, round, and reactive to light.  Cardiovascular:     Rate and Rhythm: Normal rate and regular  rhythm.  Pulmonary:     Effort: Pulmonary effort is normal.     Breath sounds: Normal breath sounds.  Neurological:     Mental Status: She is alert.      Impression and Plan:  URI with cough and congestion  Other orders -     Hydrocod Poli-Chlorphe Poli ER; Take 5 mLs by mouth at bedtime as needed.  Dispense: 70 mL; Refill: 0  -Given exam findings, PNA, pharyngitis, ear infection are not likely, hence abx have not been prescribed. -Have advised rest, fluids, OTC antihistamines, cough suppressants and mucinex. -RTC if no improvement in 10-14 days. - Since OTC cough suppressants have not been helpful, will give her a supply of Tussionex cough syrup.    Time spent:22 minutes reviewing chart, interviewing and examining patient and formulating plan of care.     Tully Theophilus Andrews, MD [facility]     [1]  Allergies Allergen Reactions   Bee Pollen Anaphylaxis   Diazepam     sucidal depression   Prednisone Nausea And Vomiting    Per pt can not take oral steroids    Amoxicillin      Severe diarrhea At high dose    Emetrol     Other reaction(s): GI upset   Gabapentin     Doesn't remember; didn't tolerate   Imuran [Azathioprine]     Patient states this was discontinued due to liver problems   Methotrexate  And Trimetrexate     diarrhea   Morphine     n & v   Propoxyphene N-Acetaminophen     n & V   Sulfa Antibiotics     MIGRAINES   Tetracyclines & Related     N & v   Propoxyphene Other (See Comments)  [2]  Current Outpatient Medications:    albuterol  (VENTOLIN  HFA) 108 (90 Base) MCG/ACT inhaler, TAKE 2 PUFFS BY MOUTH EVERY 6 HOURS AS NEEDED FOR WHEEZE OR SHORTNESS OF BREATH, Disp: 8.5 each, Rfl: 0   B-D 3CC LUER-LOK SYR 23GX1 23G X 1 3 ML MISC, Use to inject B12 once a month, Disp: 3 each, Rfl: 3   B-D 3CC LUER-LOK SYR 25GX1 25G X 1 3 ML MISC, USE 2  SYRINGES FOR B-12 INJECTIONS EVERY MONTH AS DIRECTED *REFILL REQUEST*, Disp: 2 each,  Rfl: 10   Belimumab  (BENLYSTA ) 200 MG/ML SOSY, Inject 1 ml subcutaneously once a week 30 days., Disp: 4 mL, Rfl: 10   betamethasone  dipropionate 0.05 % cream, Apply to the affectea area(s) twice daily as needed for knuckle flare ups, Disp: 165 g, Rfl: 0   betamethasone  dipropionate 0.05 % lotion, Apply topically as needed., Disp: , Rfl:    busPIRone  (BUSPAR ) 7.5 MG tablet, Take 1 tablet (7.5 mg total) by mouth 2 (two) times daily., Disp: 180 tablet, Rfl: 3   chlorpheniramine-HYDROcodone  (TUSSIONEX) 10-8 MG/5ML, Take 5 mLs by mouth at bedtime as needed., Disp: 70 mL, Rfl: 0   cholecalciferol (VITAMIN D ) 1000 UNITS tablet, Take 2,000 Units by mouth daily., Disp: , Rfl:    Cranberry 200 MG CAPS, Take 200 mg by mouth 2 (two) times daily., Disp: , Rfl:    cyanocobalamin  (VITAMIN B12) 1000 MCG/ML injection, Inject 1 mL (1,000 mcg total) into the muscle every 30 (thirty) days. Needs appt, Disp: 1 mL, Rfl: 0   estradiol  (ESTRACE ) 2 MG tablet, Take 1 tablet (2 mg total) by mouth daily., Disp: 90 tablet, Rfl: 2   fluticasone  (CUTIVATE ) 0.05 % cream, Apply to the affected areas twice daily for 1 week, stop for 1 week, then repeat as needed for flare up., Disp: 60 g, Rfl: 2   fluticasone -salmeterol (ADVAIR  HFA) 115-21 MCG/ACT inhaler, Inhale 2 puffs into the lungs 2 (two) times daily., Disp: 12 g, Rfl: 1   folic acid  (FOLVITE ) 1 MG tablet, Take 1 tablet (1 mg total) by mouth 2 (two) times daily., Disp: 60 tablet, Rfl: 2   HYDROmorphone  (DILAUDID ) 4 MG tablet, Take by mouth as needed for severe pain., Disp: , Rfl:    HYDROmorphone  (DILAUDID ) 4 MG tablet, Take 1 tablet (4 mg total) by mouth 3 (three) times daily as needed for pain, Disp: 60 tablet, Rfl: 0   HYDROmorphone  (DILAUDID ) 4 MG tablet, Take 1 tablet (4 mg total) by mouth 3 (three) times daily as needed for pain., Disp: 60 tablet, Rfl: 0   KETOCONAZOLE , TOPICAL, 1 % SHAM, Use up to twice a week as needed, Disp: , Rfl:    lamoTRIgine  (LAMICTAL ) 100 MG  tablet, Take 1 tablet (100 mg total) by mouth in the morning at breakfast and at bedtime., Disp: 180 tablet, Rfl: 2   lansoprazole  (PREVACID ) 30 MG capsule, Take 1 capsule (30 mg total) by mouth 2 (two) times daily before a meal., Disp: 180 capsule, Rfl: 3   levocetirizine (XYZAL ) 5 MG tablet, Take 1 tablet (5 mg total) by mouth every evening., Disp: 90 tablet, Rfl: 2   levothyroxine  (SYNTHROID ) 125 MCG tablet, Take 1 tablet (125 mcg total) by mouth every morning., Disp: 90 tablet, Rfl: 3   LYSINE PO, Take 1 tablet by mouth daily., Disp: , Rfl:    medroxyPROGESTERone  (PROVERA ) 2.5 MG tablet, Take 1 tablet (2.5 mg total) by mouth every morning. Needs appt, Disp: 90 tablet, Rfl: 1   methadone  (DOLOPHINE ) 5 MG tablet, Take 1 tablet by mouth 4 (four) times daily as needed., Disp: , Rfl:    methadone  (DOLOPHINE ) 5 MG tablet, Take 1 tablet (5 mg total) by mouth every 6 (six) hours., Disp: 120 tablet, Rfl: 0   methadone  (DOLOPHINE ) 5 MG tablet, Take 1 tablet (5 mg total) by mouth every 6 (six) hours., Disp: 120 tablet, Rfl: 0   methadone  (DOLOPHINE ) 5 MG tablet, Take 1 tablet (  5 mg total) by mouth every 6 (six) hours., Disp: 120 tablet, Rfl: 0   methadone  (DOLOPHINE ) 5 MG tablet, Take 1 tablet (5 mg total) by mouth every 6 (six) hours., Disp: 120 tablet, Rfl: 0   methadone  (DOLOPHINE ) 5 MG tablet, Take 1 tablet (5 mg total) by mouth every 6 (six) hours., Disp: 120 tablet, Rfl: 0   methadone  (DOLOPHINE ) 5 MG tablet, Take 1 tablet (5 mg total) by mouth every 6 (six) hours., Disp: 120 tablet, Rfl: 0   metoCLOPramide  (REGLAN ) 10 MG tablet, Take 1 tablet (10 mg total) by mouth every 6 (six) hours as needed for nausea., Disp: 30 tablet, Rfl: 0   NEEDLE, DISP, 25 G (B-D DISP NEEDLE 25GX1) 25G X 1 MISC, To use with B12., Disp: 3 each, Rfl: 3   ondansetron  (ZOFRAN ) 4 MG tablet, Take 4 mg by mouth 3 (three) times daily as needed., Disp: , Rfl:    ondansetron  (ZOFRAN ) 4 MG tablet, Take 1 tablet (4 mg total) by mouth  3 (three) times daily as needed., Disp: 45 tablet, Rfl: 0   ondansetron  (ZOFRAN ) 4 MG tablet, Take 1 tablet (4 mg total) by mouth 3 (three) times daily as needed., Disp: 45 tablet, Rfl: 1   sertraline  (ZOLOFT ) 100 MG tablet, Take 2 tablets (200 mg total) by mouth every morning., Disp: 180 tablet, Rfl: 3   Sodium Fluoride  (CLINPRO  5000) 1.1 % PSTE, USE AS DIRECTED., Disp: 100 mL, Rfl: 3   TH FAMOTIDINE 10 PO, Take 10 mg by mouth once a week. 1-2 tabs once weekly 34min-1hr prior to Benlysta  injection to aid with injection site rxn, Disp: , Rfl:    tiZANidine  (ZANAFLEX ) 4 MG tablet, Take 3 tablets (12 mg total) by mouth at bedtime., Disp: 270 tablet, Rfl: 1   tiZANidine  (ZANAFLEX ) 4 MG tablet, Take 3 tablets (12 mg total) by mouth at bedtime., Disp: 270 tablet, Rfl: 1   tiZANidine  (ZANAFLEX ) 4 MG tablet, Take 3 tablets (12 mg total) by mouth at bedtime., Disp: 270 tablet, Rfl: 1   tiZANidine  (ZANAFLEX ) 4 MG tablet, Take 3 tablets (12 mg total) by mouth at bedtime., Disp: 270 tablet, Rfl: 1   tiZANidine  (ZANAFLEX ) 4 MG tablet, Take 3 tablets (12 mg total) by mouth at bedtime., Disp: 270 tablet, Rfl: 1   tiZANidine  (ZANAFLEX ) 4 MG tablet, Take 3 tablets (12 mg total) by mouth at bedtime., Disp: 270 tablet, Rfl: 1   topiramate  (TOPAMAX ) 25 MG tablet, Take 1 tablet (25 mg total) by mouth every morning AND 4 tablets (100 mg total) at bedtime., Disp: 450 tablet, Rfl: 1   topiramate  (TOPAMAX ) 25 MG tablet, Take 1 tablet (25 mg total) by mouth every morning AND 4 tablets (100 mg total) at bedtime., Disp: 450 tablet, Rfl: 0   topiramate  (TOPAMAX ) 25 MG tablet, Take 1 tablet (25 mg total) by mouth every morning AND 4 tablets (100 mg total) at bedtime., Disp: 450 tablet, Rfl: 1   topiramate  (TOPAMAX ) 25 MG tablet, 1 tablet (25 mg total) every morning and 4 tablets (100 mg total) at bedtime., Disp: 150 tablet, Rfl: 0   topiramate  (TOPAMAX ) 25 MG tablet, Take 1 tablet (25 mg total) by mouth every morning AND 4  tablets (100 mg total) at bedtime., Disp: 450 tablet, Rfl: 1   topiramate  (TOPAMAX ) 25 MG tablet, Take 1 tablet (25 mg total) by mouth in the morning AND 4 tablets (100 mg total) at bedtime., Disp: 450 tablet, Rfl: 1   triamcinolone  (KENALOG ) 0.025 %  cream, Apply to entire nail bed twice daily for 2 weeks on and 1 week off, then repeat cycle, Disp: 80 g, Rfl: 0   triamcinolone  cream (KENALOG ) 0.1 %, daily as needed., Disp: , Rfl:

## 2024-09-06 ENCOUNTER — Other Ambulatory Visit: Payer: Self-pay

## 2024-09-06 ENCOUNTER — Other Ambulatory Visit (HOSPITAL_COMMUNITY): Payer: Self-pay

## 2024-09-07 ENCOUNTER — Other Ambulatory Visit (HOSPITAL_COMMUNITY): Payer: Self-pay

## 2024-09-07 NOTE — Progress Notes (Signed)
 Remote Loop Recorder Transmission

## 2024-09-11 ENCOUNTER — Other Ambulatory Visit: Payer: Self-pay

## 2024-09-11 ENCOUNTER — Other Ambulatory Visit (HOSPITAL_COMMUNITY): Payer: Self-pay

## 2024-09-13 ENCOUNTER — Other Ambulatory Visit (HOSPITAL_COMMUNITY): Payer: Self-pay

## 2024-09-15 ENCOUNTER — Other Ambulatory Visit: Payer: Self-pay

## 2024-09-15 ENCOUNTER — Encounter: Payer: Self-pay | Admitting: Adult Health

## 2024-09-18 ENCOUNTER — Other Ambulatory Visit: Payer: Self-pay

## 2024-09-18 ENCOUNTER — Ambulatory Visit: Payer: Self-pay | Admitting: Cardiovascular Disease

## 2024-09-19 ENCOUNTER — Other Ambulatory Visit (HOSPITAL_COMMUNITY): Payer: Self-pay

## 2024-09-22 ENCOUNTER — Other Ambulatory Visit (HOSPITAL_COMMUNITY): Payer: Self-pay

## 2024-09-27 ENCOUNTER — Other Ambulatory Visit (HOSPITAL_COMMUNITY): Payer: Self-pay

## 2024-09-29 ENCOUNTER — Encounter

## 2024-10-02 ENCOUNTER — Encounter

## 2024-10-02 ENCOUNTER — Ambulatory Visit

## 2024-10-02 DIAGNOSIS — I471 Supraventricular tachycardia, unspecified: Secondary | ICD-10-CM

## 2024-10-02 LAB — CUP PACEART REMOTE DEVICE CHECK
Date Time Interrogation Session: 20260111231124
Implantable Pulse Generator Implant Date: 20221006

## 2024-10-03 ENCOUNTER — Ambulatory Visit: Payer: Self-pay | Admitting: Cardiovascular Disease

## 2024-10-04 NOTE — Progress Notes (Signed)
 Remote Loop Recorder Transmission

## 2024-10-06 ENCOUNTER — Ambulatory Visit: Admitting: Family Medicine

## 2024-10-06 ENCOUNTER — Other Ambulatory Visit: Payer: Self-pay

## 2024-10-06 ENCOUNTER — Encounter: Payer: Self-pay | Admitting: Family Medicine

## 2024-10-06 ENCOUNTER — Other Ambulatory Visit (HOSPITAL_COMMUNITY): Payer: Self-pay

## 2024-10-06 VITALS — BP 138/82 | HR 72 | Temp 98.7°F | Ht 64.0 in | Wt 175.0 lb

## 2024-10-06 DIAGNOSIS — N951 Menopausal and female climacteric states: Secondary | ICD-10-CM

## 2024-10-06 DIAGNOSIS — Z23 Encounter for immunization: Secondary | ICD-10-CM

## 2024-10-06 DIAGNOSIS — M329 Systemic lupus erythematosus, unspecified: Secondary | ICD-10-CM

## 2024-10-06 DIAGNOSIS — E039 Hypothyroidism, unspecified: Secondary | ICD-10-CM | POA: Diagnosis not present

## 2024-10-06 DIAGNOSIS — F331 Major depressive disorder, recurrent, moderate: Secondary | ICD-10-CM

## 2024-10-06 MED ORDER — FLUTICASONE PROPIONATE 50 MCG/ACT NA SUSP
2.0000 | Freq: Every day | NASAL | 6 refills | Status: AC
  Filled 2024-10-06: qty 16, 30d supply, fill #0

## 2024-10-06 MED ORDER — ESTRADIOL 2 MG PO TABS
2.0000 mg | ORAL_TABLET | Freq: Every day | ORAL | 2 refills | Status: AC
  Filled 2024-10-06: qty 90, 90d supply, fill #0
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
## 2024-10-06 NOTE — Addendum Note (Signed)
 Addended by: ANGELENA RONAL BRADLEY K on: 10/06/2024 12:44 PM   Modules accepted: Orders

## 2024-10-07 LAB — URINALYSIS, ROUTINE W REFLEX MICROSCOPIC
Bilirubin Urine: NEGATIVE
Glucose, UA: NEGATIVE
Hgb urine dipstick: NEGATIVE
Hyaline Cast: NONE SEEN /LPF
Ketones, ur: NEGATIVE
Nitrite: NEGATIVE
Protein, ur: NEGATIVE
RBC / HPF: NONE SEEN /HPF (ref 0–2)
Specific Gravity, Urine: 1.015 (ref 1.001–1.035)
WBC, UA: >=60 /HPF — AB (ref 0–5)
pH: <=5 — AB (ref 5.0–8.0)

## 2024-10-07 LAB — MICROSCOPIC MESSAGE

## 2024-10-08 ENCOUNTER — Other Ambulatory Visit: Payer: Self-pay

## 2024-10-08 ENCOUNTER — Encounter: Payer: Self-pay | Admitting: Adult Health

## 2024-10-09 ENCOUNTER — Ambulatory Visit: Payer: Self-pay | Admitting: Family Medicine

## 2024-10-09 ENCOUNTER — Other Ambulatory Visit: Payer: Self-pay

## 2024-10-10 LAB — COMPREHENSIVE METABOLIC PANEL WITH GFR
AG Ratio: 1.9 (calc) (ref 1.0–2.5)
ALT: 6 U/L (ref 6–29)
AST: 13 U/L (ref 10–35)
Albumin: 4.4 g/dL (ref 3.6–5.1)
Alkaline phosphatase (APISO): 68 U/L (ref 37–153)
BUN: 14 mg/dL (ref 7–25)
CO2: 21 mmol/L (ref 20–32)
Calcium: 9.3 mg/dL (ref 8.6–10.4)
Chloride: 105 mmol/L (ref 98–110)
Creat: 0.99 mg/dL (ref 0.50–1.05)
Globulin: 2.3 g/dL (ref 1.9–3.7)
Glucose, Bld: 94 mg/dL (ref 65–99)
Potassium: 4.9 mmol/L (ref 3.5–5.3)
Sodium: 137 mmol/L (ref 135–146)
Total Bilirubin: 0.3 mg/dL (ref 0.2–1.2)
Total Protein: 6.7 g/dL (ref 6.1–8.1)
eGFR: 65 mL/min/1.73m2

## 2024-10-10 LAB — CBC WITH DIFFERENTIAL/PLATELET
Absolute Lymphocytes: 1568 {cells}/uL (ref 850–3900)
Absolute Monocytes: 451 {cells}/uL (ref 200–950)
Basophils Absolute: 50 {cells}/uL (ref 0–200)
Basophils Relative: 0.9 %
Eosinophils Absolute: 160 {cells}/uL (ref 15–500)
Eosinophils Relative: 2.9 %
HCT: 40.5 % (ref 35.9–46.0)
Hemoglobin: 13.3 g/dL (ref 11.7–15.5)
MCH: 29.1 pg (ref 27.0–33.0)
MCHC: 32.8 g/dL (ref 31.6–35.4)
MCV: 88.6 fL (ref 81.4–101.7)
MPV: 9.4 fL (ref 7.5–12.5)
Monocytes Relative: 8.2 %
Neutro Abs: 3273 {cells}/uL (ref 1500–7800)
Neutrophils Relative %: 59.5 %
Platelets: 285 Thousand/uL (ref 140–400)
RBC: 4.57 Million/uL (ref 3.80–5.10)
RDW: 12.4 % (ref 11.0–15.0)
Total Lymphocyte: 28.5 %
WBC: 5.5 Thousand/uL (ref 3.8–10.8)

## 2024-10-10 LAB — PROTEIN / CREATININE RATIO, URINE
Creatinine, Urine: 75 mg/dL (ref 20–275)
Protein/Creat Ratio: 107 mg/g{creat} (ref 24–184)
Protein/Creatinine Ratio: 0.107 mg/mg{creat} (ref 0.024–0.184)
Total Protein, Urine: 8 mg/dL (ref 5–24)

## 2024-10-10 LAB — ANTI-NUCLEAR AB-TITER (ANA TITER): ANA Titer 1: 1:40 {titer} — ABNORMAL HIGH

## 2024-10-10 LAB — SEDIMENTATION RATE: Sed Rate: 9 mm/h (ref 0–30)

## 2024-10-10 LAB — ANA: Anti Nuclear Antibody (ANA): POSITIVE — AB

## 2024-10-10 LAB — TSH: TSH: 2.24 m[IU]/L (ref 0.40–4.50)

## 2024-10-16 ENCOUNTER — Other Ambulatory Visit (HOSPITAL_COMMUNITY): Payer: Self-pay

## 2024-10-30 ENCOUNTER — Encounter

## 2024-11-02 ENCOUNTER — Ambulatory Visit

## 2024-11-02 ENCOUNTER — Encounter

## 2024-11-30 ENCOUNTER — Encounter

## 2024-12-03 ENCOUNTER — Ambulatory Visit

## 2024-12-04 ENCOUNTER — Encounter

## 2024-12-31 ENCOUNTER — Encounter

## 2025-01-01 ENCOUNTER — Encounter: Admitting: Family Medicine

## 2025-01-03 ENCOUNTER — Ambulatory Visit

## 2025-01-04 ENCOUNTER — Encounter

## 2025-01-31 ENCOUNTER — Encounter

## 2025-02-03 ENCOUNTER — Ambulatory Visit

## 2025-02-05 ENCOUNTER — Encounter

## 2025-03-03 ENCOUNTER — Encounter

## 2025-03-06 ENCOUNTER — Ambulatory Visit

## 2025-03-08 ENCOUNTER — Encounter
